# Patient Record
Sex: Female | Born: 1940 | Race: White | Hispanic: No | Marital: Married | Smoking: Never smoker
Health system: Southern US, Community
[De-identification: ages and names within clinical notes are randomized; demographics above are authoritative.]

## PROBLEM LIST (undated history)

## (undated) DIAGNOSIS — E785 Hyperlipidemia, unspecified: Secondary | ICD-10-CM

## (undated) DIAGNOSIS — I209 Angina pectoris, unspecified: Secondary | ICD-10-CM

## (undated) DIAGNOSIS — K219 Gastro-esophageal reflux disease without esophagitis: Secondary | ICD-10-CM

## (undated) DIAGNOSIS — M1712 Unilateral primary osteoarthritis, left knee: Secondary | ICD-10-CM

## (undated) DIAGNOSIS — R51 Headache: Secondary | ICD-10-CM

## (undated) DIAGNOSIS — E663 Overweight: Secondary | ICD-10-CM

## (undated) DIAGNOSIS — R0600 Dyspnea, unspecified: Secondary | ICD-10-CM

## (undated) DIAGNOSIS — F32A Depression, unspecified: Secondary | ICD-10-CM

## (undated) DIAGNOSIS — R519 Headache, unspecified: Secondary | ICD-10-CM

## (undated) DIAGNOSIS — E079 Disorder of thyroid, unspecified: Secondary | ICD-10-CM

## (undated) DIAGNOSIS — M549 Dorsalgia, unspecified: Secondary | ICD-10-CM

## (undated) DIAGNOSIS — I679 Cerebrovascular disease, unspecified: Secondary | ICD-10-CM

## (undated) DIAGNOSIS — F419 Anxiety disorder, unspecified: Secondary | ICD-10-CM

## (undated) DIAGNOSIS — J449 Chronic obstructive pulmonary disease, unspecified: Secondary | ICD-10-CM

## (undated) DIAGNOSIS — I639 Cerebral infarction, unspecified: Secondary | ICD-10-CM

## (undated) DIAGNOSIS — K279 Peptic ulcer, site unspecified, unspecified as acute or chronic, without hemorrhage or perforation: Secondary | ICD-10-CM

## (undated) DIAGNOSIS — I2510 Atherosclerotic heart disease of native coronary artery without angina pectoris: Secondary | ICD-10-CM

## (undated) DIAGNOSIS — F329 Major depressive disorder, single episode, unspecified: Secondary | ICD-10-CM

## (undated) DIAGNOSIS — E039 Hypothyroidism, unspecified: Secondary | ICD-10-CM

## (undated) DIAGNOSIS — I10 Essential (primary) hypertension: Secondary | ICD-10-CM

## (undated) DIAGNOSIS — N189 Chronic kidney disease, unspecified: Secondary | ICD-10-CM

## (undated) DIAGNOSIS — G4730 Sleep apnea, unspecified: Secondary | ICD-10-CM

## (undated) HISTORY — DX: Major depressive disorder, single episode, unspecified: F32.9

## (undated) HISTORY — PX: CARPAL TUNNEL RELEASE: SHX101

## (undated) HISTORY — DX: Depression, unspecified: F32.A

## (undated) HISTORY — DX: Angina pectoris, unspecified: I20.9

## (undated) HISTORY — DX: Atherosclerotic heart disease of native coronary artery without angina pectoris: I25.10

## (undated) HISTORY — DX: Hyperlipidemia, unspecified: E78.5

## (undated) HISTORY — DX: Overweight: E66.3

## (undated) HISTORY — DX: Essential (primary) hypertension: I10

## (undated) HISTORY — DX: Chronic kidney disease, unspecified: N18.9

## (undated) HISTORY — DX: Cerebrovascular disease, unspecified: I67.9

## (undated) HISTORY — DX: Dorsalgia, unspecified: M54.9

## (undated) HISTORY — DX: Peptic ulcer, site unspecified, unspecified as acute or chronic, without hemorrhage or perforation: K27.9

## (undated) HISTORY — DX: Disorder of thyroid, unspecified: E07.9

## (undated) HISTORY — DX: Cerebral infarction, unspecified: I63.9

## (undated) HISTORY — PX: TOTAL ABDOMINAL HYSTERECTOMY W/ BILATERAL SALPINGOOPHORECTOMY: SHX83

## (undated) HISTORY — PX: CHOLECYSTECTOMY: SHX55

## (undated) HISTORY — PX: ABDOMINAL HYSTERECTOMY: SHX81

## (undated) HISTORY — PX: JOINT REPLACEMENT: SHX530

## (undated) HISTORY — DX: Sleep apnea, unspecified: G47.30

---

## 2005-09-07 ENCOUNTER — Ambulatory Visit: Payer: Self-pay | Admitting: Cardiology

## 2005-09-11 ENCOUNTER — Ambulatory Visit: Payer: Self-pay | Admitting: *Deleted

## 2005-09-11 ENCOUNTER — Inpatient Hospital Stay (HOSPITAL_BASED_OUTPATIENT_CLINIC_OR_DEPARTMENT_OTHER): Admission: RE | Admit: 2005-09-11 | Discharge: 2005-09-11 | Payer: Self-pay | Admitting: Cardiology

## 2005-09-16 ENCOUNTER — Ambulatory Visit: Payer: Self-pay | Admitting: Cardiology

## 2005-09-16 ENCOUNTER — Inpatient Hospital Stay (HOSPITAL_COMMUNITY): Admission: RE | Admit: 2005-09-16 | Discharge: 2005-09-19 | Payer: Self-pay | Admitting: Cardiology

## 2005-09-16 ENCOUNTER — Encounter: Payer: Self-pay | Admitting: Cardiovascular Disease

## 2005-09-17 ENCOUNTER — Encounter (INDEPENDENT_AMBULATORY_CARE_PROVIDER_SITE_OTHER): Payer: Self-pay | Admitting: Specialist

## 2005-09-19 ENCOUNTER — Ambulatory Visit: Payer: Self-pay | Admitting: Internal Medicine

## 2005-10-06 ENCOUNTER — Ambulatory Visit: Payer: Self-pay | Admitting: Cardiology

## 2005-10-13 ENCOUNTER — Ambulatory Visit: Payer: Self-pay | Admitting: Cardiology

## 2006-01-05 ENCOUNTER — Ambulatory Visit: Payer: Self-pay | Admitting: Internal Medicine

## 2006-01-25 ENCOUNTER — Ambulatory Visit: Payer: Self-pay | Admitting: Internal Medicine

## 2006-01-25 ENCOUNTER — Encounter (INDEPENDENT_AMBULATORY_CARE_PROVIDER_SITE_OTHER): Payer: Self-pay | Admitting: Specialist

## 2006-02-08 ENCOUNTER — Ambulatory Visit (HOSPITAL_COMMUNITY): Admission: RE | Admit: 2006-02-08 | Discharge: 2006-02-08 | Payer: Self-pay | Admitting: Internal Medicine

## 2007-01-11 ENCOUNTER — Ambulatory Visit: Payer: Self-pay | Admitting: Cardiology

## 2007-05-16 ENCOUNTER — Ambulatory Visit: Payer: Self-pay | Admitting: Cardiology

## 2007-05-18 ENCOUNTER — Ambulatory Visit: Payer: Self-pay | Admitting: Cardiovascular Disease

## 2007-05-18 ENCOUNTER — Ambulatory Visit (HOSPITAL_COMMUNITY): Admission: AD | Admit: 2007-05-18 | Discharge: 2007-05-19 | Payer: Self-pay | Admitting: Cardiovascular Disease

## 2007-06-03 ENCOUNTER — Ambulatory Visit: Payer: Self-pay | Admitting: Cardiology

## 2007-06-03 ENCOUNTER — Encounter: Payer: Self-pay | Admitting: Cardiology

## 2007-12-15 ENCOUNTER — Encounter: Payer: Self-pay | Admitting: Cardiology

## 2008-06-08 ENCOUNTER — Ambulatory Visit: Payer: Self-pay | Admitting: Cardiology

## 2008-06-19 ENCOUNTER — Ambulatory Visit: Payer: Self-pay | Admitting: Cardiology

## 2008-06-19 ENCOUNTER — Encounter: Payer: Self-pay | Admitting: Cardiology

## 2008-06-22 ENCOUNTER — Ambulatory Visit: Payer: Self-pay | Admitting: Cardiology

## 2008-08-08 ENCOUNTER — Ambulatory Visit: Payer: Self-pay | Admitting: Cardiology

## 2008-11-21 ENCOUNTER — Encounter: Payer: Self-pay | Admitting: Cardiology

## 2009-01-05 DIAGNOSIS — I208 Other forms of angina pectoris: Secondary | ICD-10-CM

## 2009-01-05 DIAGNOSIS — I2510 Atherosclerotic heart disease of native coronary artery without angina pectoris: Secondary | ICD-10-CM

## 2009-01-05 DIAGNOSIS — I10 Essential (primary) hypertension: Secondary | ICD-10-CM | POA: Insufficient documentation

## 2009-01-05 DIAGNOSIS — E663 Overweight: Secondary | ICD-10-CM

## 2009-01-05 DIAGNOSIS — E785 Hyperlipidemia, unspecified: Secondary | ICD-10-CM | POA: Insufficient documentation

## 2009-02-12 ENCOUNTER — Telehealth (INDEPENDENT_AMBULATORY_CARE_PROVIDER_SITE_OTHER): Payer: Self-pay | Admitting: *Deleted

## 2009-02-12 ENCOUNTER — Encounter: Payer: Self-pay | Admitting: Cardiology

## 2009-04-08 ENCOUNTER — Encounter: Payer: Self-pay | Admitting: Cardiology

## 2009-04-15 ENCOUNTER — Ambulatory Visit: Payer: Self-pay | Admitting: Cardiology

## 2009-04-15 DIAGNOSIS — M25519 Pain in unspecified shoulder: Secondary | ICD-10-CM | POA: Insufficient documentation

## 2009-04-25 ENCOUNTER — Encounter: Payer: Self-pay | Admitting: Cardiology

## 2009-05-02 ENCOUNTER — Encounter: Payer: Self-pay | Admitting: Cardiology

## 2009-05-15 ENCOUNTER — Encounter: Payer: Self-pay | Admitting: Cardiology

## 2009-05-20 ENCOUNTER — Ambulatory Visit: Payer: Self-pay | Admitting: Cardiology

## 2009-06-03 ENCOUNTER — Encounter: Payer: Self-pay | Admitting: Cardiology

## 2009-06-10 ENCOUNTER — Telehealth: Payer: Self-pay | Admitting: Cardiology

## 2009-06-13 ENCOUNTER — Encounter: Payer: Self-pay | Admitting: Cardiology

## 2009-07-08 ENCOUNTER — Ambulatory Visit (HOSPITAL_COMMUNITY): Admission: RE | Admit: 2009-07-08 | Discharge: 2009-07-09 | Payer: Self-pay | Admitting: Orthopedic Surgery

## 2009-07-08 HISTORY — PX: ROTATOR CUFF REPAIR: SHX139

## 2009-08-08 ENCOUNTER — Encounter: Payer: Self-pay | Admitting: Cardiology

## 2010-02-13 ENCOUNTER — Ambulatory Visit: Payer: Self-pay | Admitting: Cardiology

## 2010-02-26 ENCOUNTER — Ambulatory Visit: Payer: Self-pay | Admitting: Cardiology

## 2010-04-29 ENCOUNTER — Encounter: Payer: Self-pay | Admitting: Cardiology

## 2010-04-29 NOTE — Assessment & Plan Note (Signed)
Summary: 6 MO F/U PER NOV REMINDER-JM   Visit Type:  Follow-up Primary Provider:  Dr. Selinda Flavin  CC:  follow-up visit.  History of Present Illness: the patient is 70 year old female with a history of coronary artery disease. status post prior stent placement in 2007.the patient has a history of angina but this has been well controlled with Imdur. Unfortunately, recently she had a fall after she quickly stepped out of the car, stood up and felt very dizzy. she experienced presyncope and eventuallyfell to the ground on her right shoulder. The patient however denies any loss of consciousness. She also points to a small area in the right hemi-chest of continuous sharp nagging pain. She states it has worsened after her recent fall.she denies other recent exertional chest pain or shortness of breath. She has no orthopnea or PND.  Clinical Review Panels:  Echocardiogram Echocardiogram Conclusions:         1. The estimated ejection fraction is 60-65%.          2. Global left ventricular wall motion and contractility are within         normal limits.         3. Valves are otherwise unremarkable without any hemodynamically         significant lesions.          4. The pericardium appears normal. (05/17/2007)  Nuclear Stress Testing Nuclear Stress Test Findings  Comments:         1. Lexiscan bolus per protocol.  No diagnostic ST changes by standard         criteria. The patient experienced no chest pain.         2. Normal LV perfusion in the setting of breast attenuation artifact.         The patient's calculated post stress LVEF was 83%. TID Ratio was         1.11. (06/19/2008)  Cardiac Imaging Cardiac Cath Findings  LEFT VENTRICULOGRAPHY:  Was deferred secondary to renal insufficiency.      ASSESSMENT:   1. Widely patent right coronary artery stent.   2. Nonobstructive coronary artery disease.      PLAN:  Recommend continued medical therapy for the patient's coronary   artery disease.   There are no areas of high-grade stenosis that warrant   PCI.      Veverly Fells. Excell Seltzer, MD  (05/18/2007)    Preventive Screening-Counseling & Management  Alcohol-Tobacco     Smoking Status: never  Current Medications (verified): 1)  Isosorbide Mononitrate Cr 30 Mg Xr24h-Tab (Isosorbide Mononitrate) .... Take One Tablet By Mouth Daily 2)  Nitrolingual 0.4 Mg/spray Soln (Nitroglycerin) .... Place 1 Spray On Tongue As Directed 3)  Protonix 40 Mg Tbec (Pantoprazole Sodium) .... Take 1 Tablet By Mouth Once A Day 4)  Citalopram Hydrobromide 20 Mg Tabs (Citalopram Hydrobromide) .... Take 1 Tablet By Mouth Once A Day 5)  Synthroid 88 Mcg Tabs (Levothyroxine Sodium) .... Take 1 Tablet By Mouth Once A Day 6)  Acyclovir 400 Mg Tabs (Acyclovir) .... Take 1 Tablet By Mouth Once A Day 7)  Zetia 10 Mg Tabs (Ezetimibe) .... Take 1 Tablet By Mouth Once A Day 8)  Plavix 75 Mg Tabs (Clopidogrel Bisulfate) .... Take One Tablet By Mouth Daily 9)  Losartan Potassium 25 Mg Tabs (Losartan Potassium) .... Take 1 Tablet By Mouth Two Times A Day 10)  Zyrtec Allergy 10 Mg Tabs (Cetirizine Hcl) .... Take 1 Tablet By Mouth Once A Day 11)  Aspirin 81 Mg Tbec (Aspirin) .... Take One Tablet By Mouth Daily 12)  Calcium Carbonate 600 Mg Tabs (Calcium Carbonate) .... Take 1 Tablet By Mouth Once A Day 13)  Vitamin D 400 Unit Tabs (Cholecalciferol) .... Take 1 Tablet By Mouth Once A Day 14)  Fish Oil 1200 Mg Caps (Omega-3 Fatty Acids) .... Take 1 Capsule By Mouth Once A Day 15)  Folic Acid 800 Mcg Tabs (Folic Acid) .... Take 1 Tablet By Mouth Once A Day 16)  Multivitamins  Tabs (Multiple Vitamin) .... Take 1 Tablet By Mouth Once A Day  Allergies: 1)  ! Avelox (Moxifloxacin Hcl) 2)  ! * Statins 3)  ! Lopid 4)  ! Ace Inhibitors  Comments:  Nurse/Medical Assistant: The patient's medications were reviewed with the patient and were updated in the Medication List. Pt brought a list of medications to her office  visit. 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  Nonobstructive cerebrovascular disease History of stroke Depression Chronic renal insufficiency GFR 45 ML's per minute Nonobstructive cerebrovascular disease by carotid Dopplers October 2008  Review of Systems  The patient denies fatigue, malaise, fever, weight gain/loss, vision loss, decreased hearing, hoarseness, chest pain, palpitations, shortness of breath, prolonged cough, wheezing, sleep apnea, coughing up blood, abdominal pain, blood in stool, nausea, vomiting, diarrhea, heartburn, incontinence, blood in urine, muscle weakness, joint pain, leg swelling, rash, skin lesions, headache, fainting, dizziness, depression, anxiety, enlarged lymph nodes, easy bruising or bleeding, and environmental allergies.         right arm pain. Atypical chest pain status post fall  Vital Signs:  Patient profile:   70 year old female Height:      62 inches Weight:      211 pounds BMI:     38.73 Pulse rate:   55 / minute BP sitting:   173 / 84  (left arm) Cuff size:   large  Vitals Entered By: Cyril Loosen, RN, BSN (April 15, 2009 1:16 PM)  Nutrition Counseling: Patient's BMI is greater than 25 and therefore counseled on weight management options. CC: follow-up visit   Physical Exam  Additional Exam:  General: Well-developed, well-nourished in no distress head: Normocephalic and atraumatic eyes  PERRLA/EOMI intact, conjunctiva and lids normal nose: No deformity or lesions mouth normal dentition, normal posterior pharynx neck: Supple, no JVD.  No masses, thyromegaly or abnormal cervical nodes lungs: Normal breath sounds bilaterally without wheezing.  Normal percussion heart: regular rate and rhythm with normal S1 and S2, no S3 or S4.  PMI is normal.  No pathological murmurs abdomen: Normal bowel sounds, abdomen is soft and nontender without masses, organomegaly or hernias noted.  No hepatosplenomegaly musculoskeletal: Back normal, normal gait muscle strength and tone normal. Decreased range of motion in right shoulder joint pulsus: Pulse is normal in all 4 extremities Extremities: No peripheral pitting edema neurologic: Alert and oriented x 3 skin: Intact without lesions or rashes cervical nodes: No significant adenopathy psychologic: Normal affect    Impression & Recommendations:  Problem # 1:  CAD, NATIVE VESSEL (ICD-414.01) the patient has known coronary artery disease. She  reports atypical chest pain not consistent with angina. 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 The patient had Lexiscan done early last year. Her updated medication list for this problem includes:    Isosorbide Mononitrate Cr 30 Mg Xr24h-tab (Isosorbide mononitrate) .Marland Kitchen... Take one tablet by mouth daily    Nitrolingual 0.4 Mg/spray Soln (Nitroglycerin) .Marland Kitchen... Place 1 spray on tongue as directed    Plavix 75 Mg Tabs (Clopidogrel bisulfate) .Marland Kitchen... Take one tablet by mouth daily    Aspirin 81 Mg Tbec (Aspirin) .Marland Kitchen... Take one tablet by mouth daily  Orders: T-Basic Metabolic Panel ([id number] Orders: T- * Misc. Laboratory test [id number]) ... 04/24/2009  Problem # 2:  HYPERTENSION, UNSPECIFIED (ICD-401.9) blood pressure is poorly controlled. Systolic blood pressure was 173 mmHg. I discontinued metoprolol and change the patient to losartan 25 mg p.o. b.i.d. I stopped the  beta blocker because she also complains of fatigue and weakness and she is bradycardic. We will check a BMET and plasma renin activity in one week to help [place] further diet treatment with renin blockers or  diuretics. Her updated medication list for this problem includes:    Losartan Potassium 25 Mg Tabs (Losartan potassium) .Marland Kitchen... Take 1 tablet by mouth two times a day    Aspirin 81 Mg Tbec (Aspirin) .Marland Kitchen... Take one tablet by mouth daily  Orders: T-Basic Metabolic Panel ([id number] Orders: T- * Misc. Laboratory test [id number]) ... 04/24/2009  Problem # 3:  HYPERLIPIDEMIA-MIXED (ICD-272.4) lipid panel is followed by the patient's primary care physician. Her updated medication list for this problem includes:    Zetia 10 Mg Tabs (Ezetimibe) .Marland Kitchen... Take 1 tablet by mouth once a day  Orders: T-Basic Metabolic Panel ([id number] Orders: T- * Misc. Laboratory test [id number]) ... 04/24/2009  Problem # 4:  SHOULDER PAIN (ICD-719.41) I have given the patient orthopedic referral but she continues to have significant pain in the right shoulder after her recent fall. Orders: Orthopedic Referral (Ortho)  Patient Instructions: 1)  Referral to Dr. Herbert Moors for right shoulder and knee - appt. scheduled for Friday, January 21 at 9:00.  [phone]) 2)  Stop Metoprolol 3)  Change to Losartan 25mg  two times a day  4)  Labs:  BMET and Plasma Renin Activity in next 7-10 days.   5)  Follow up in  6 months. 6)  Insurance kicked out the Losartan 25mg  two times a day, so will change rx to 100mg  tablet.  Will take 1/2 tab (50mg ) daily x 7 days, then 1 tab daily.  Pt. notified and [facility] also.   Prescriptions: LOSARTAN POTASSIUM 25 MG TABS (LOSARTAN POTASSIUM) Take 1 tablet by mouth two times a day  #60 x 6   Entered by:   Hoover Brunette, LPN   Authorized by:   Lewayne Bunting, MD, [facility]   Signed by:   Hoover Brunette, LPN on 74/25/9563   Method used:   Electronically to        [facility]  [id number] (retail)        [address]       [place], [place]  [address]       Ph: [phone] or [phone]       Fax: [phone]   RxID:   [id number]   Appended Document: 6 MO F/U PER NOV REMINDER-JM Patient should at low risk for cardiovascular complications related to shoulder surgery. patient has SCP, but this is atypical and  had a negative ischemia work- up less then a year ago.  {USER.REALNAME}  {DATETIMESTAMP()}  Appended Document: 6 MO F/U PER NOV REMINDER-JM info faxed to [employer] [phone]

## 2010-04-29 NOTE — Letter (Signed)
Summary: Letter/ [facility]  Letter/ [facility]   Imported By: Dorise Hiss 06/03/2009 13:57:18  _____________________________________________________________________  External Attachment:    Type:   Image     Comment:   External Document  Appended Document: Letter/ [facility] info faxed to [phone]

## 2010-04-29 NOTE — Assessment & Plan Note (Signed)
Summary: orthostatic bp --agh  Nurse Visit   Vital Signs:  Patient profile:   70 year old female Height:      62 inches Weight:      208 pounds Pulse (ortho):   81 / minute BP standing:   127 / 67  Vitals Entered By: Carlye Grippe (February 26, 2010 8:42 AM)  Serial Vital Signs/Assessments:  Time      Position  BP       Pulse  Resp  Temp     By 8:55 AM   Lying LA  126/72   68                    Lydia Anderson 8:55 AM   Sitting   97/63    73                    Lydia Anderson 8:55 AM   Standing  127/67   81                    Lydia Anderson 8:58 AM   Standing  131/80   82                    Carlye Grippe 9:01 AM   Standing  116/77   87                    Carlye Grippe   Visit Type:  nurse visit  CC:  orthostatic blood pressures.  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  Nonobstructive cerebrovascular disease History of stroke Depression Chronic renal insufficiency GFR 45 ML's per minute Nonobstructive cerebrovascular disease by carotid Dopplers October 2008   Current Medications (verified): 1)  Imdur 60 Mg Xr24h-Tab (Isosorbide Mononitrate) .... Take 1 1/2 Tab (90mg ) Daily 2)  Nitrolingual 0.4 Mg/spray Soln (Nitroglycerin) .... Place 1 Spray On Tongue As Directed 3)  Citalopram Hydrobromide 20 Mg Tabs (Citalopram Hydrobromide)  .... Take 1 Tablet By Mouth Once A Day 4)  Synthroid 88 Mcg Tabs (Levothyroxine Sodium) .... Take 1 Tablet By Mouth Once A Day 5)  Acyclovir 400 Mg Tabs (Acyclovir) .... Take 1 Tablet By Mouth Once A Day 6)  Zetia 10 Mg Tabs (Ezetimibe) .... Take 1 Tablet By Mouth Once A Day 7)  Plavix 75 Mg Tabs (Clopidogrel Bisulfate) .... Take One Tablet By Mouth Daily 8)  Zyrtec Allergy 10 Mg Tabs (Cetirizine Hcl) .... Take 1 Tablet By Mouth Once A Day 9)  Aspirin 81 Mg Tbec (Aspirin) .... Take One Tablet By Mouth Daily 10)  Omeprazole 20 Mg Cpdr (Omeprazole) .... Take 1 Tablet By Mouth Once A Day  Allergies (verified): 1)  ! Avelox (Moxifloxacin Hcl) 2)  ! * Statins 3)  ! Lopid 4)  ! Ace Inhibitors  Comments:  Nurse/Medical Assistant: The patient's medication bottles and allergies were reviewed with the patient and were updated in the Medication and Allergy Lists.  Orders Added: 1)  Est. Patient Level I [16109]  blood pressure better off losartan.  Still some slight orthostasis.  Notify patient to get up slowly and consider compression stockings. Lewayne Bunting, MD, [facility]  March 05, 2010 11:57 AM   Patient notified.    Hoover Brunette, LPN  March 06, 2010 10:24 AM

## 2010-04-29 NOTE — Progress Notes (Signed)
Summary: PHONE: NEEDS PRE OP FORM FOR SURGERY COMPLETED  Phone Note Call from Patient   Caller: Patient Reason for Call: Talk to Doctor Summary of Call: needs for you to please complete a form from Dr. Collie Siad that is in EMR so that she can get surgery scheduled for a shoulder surgery. Initial call taken by: Zachary George,  June 10, 2009 3:48 PM  Follow-up for Phone Call        info faxed to [phone] [facility]. Follow-up by: Carlye Grippe,  June 13, 2009 2:01 PM

## 2010-04-29 NOTE — Assessment & Plan Note (Signed)
Summary: Y8M   Visit Type:  Follow-up Primary Provider:  Dr. Selinda Flavin   History of Present Illness: the patient is 70 year old female with a history of coronary artery disease. status post prior stent placement in 2007. the patient had a followup cardiac catheterization 2009 with no significant obstructive coronary artery disease.she also had a negative Lexiscan done in 2010.the patient has a history of angina but this has been well controlled with Imdur.The patient had blood work done in May of 2011 creatinine was 1.3 with a GFR of 41 mL's per minute. Potassium was 3.5. INR was within normal limits. CBC was also within normal limits. reportedly the patient needs to have shoulder surgery done.shoulder has been done. Needs knee surgery.  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 Dr Dimas Aguas ordered studies.  CT scan of the chest showed stable anterior mediastinal nodule compared to prior CT scans. Recommendation is to perform a limited scan of the mediastinum in the future and no active lung disease.  Feels exhausted all the time. Just taking trash out makes her very fatigued. Off on chest pain. Imdur helped last time. Orthostatic symptoms. Left neck cramp. Maybe BP running too low.  Preventive Screening-Counseling & Management  Alcohol-Tobacco     Smoking Status: never  Current Medications (verified): 1)  Imdur 60 Mg Xr24h-Tab (Isosorbide Mononitrate) .... Take 1 1/2 Tab (90mg ) Daily 2)  Nitrolingual 0.4 Mg/spray Soln (Nitroglycerin) .... Place 1 Spray On Tongue As Directed 3)  Citalopram Hydrobromide 20 Mg Tabs (Citalopram Hydrobromide) .... Take 1 Tablet By Mouth Once A Day 4)  Synthroid 88 Mcg  Tabs (Levothyroxine Sodium) .... Take 1 Tablet By Mouth Once A Day 5)  Acyclovir 400 Mg Tabs (Acyclovir) .... Take 1 Tablet By Mouth Once A Day 6)  Zetia 10 Mg Tabs (Ezetimibe) .... Take 1 Tablet By Mouth Once A Day 7)  Plavix 75 Mg Tabs (Clopidogrel Bisulfate) .... Take One Tablet By Mouth Daily 8)  Zyrtec Allergy 10 Mg Tabs (Cetirizine Hcl) .... Take 1 Tablet By Mouth Once A Day 9)  Aspirin 81 Mg Tbec (Aspirin) .... Take One Tablet By Mouth Daily 10)  Omeprazole 20 Mg Cpdr (Omeprazole) .... Take 1 Tablet By Mouth Once A Day  Allergies (verified): 1)  ! Avelox (Moxifloxacin Hcl) 2)  ! * Statins 3)  ! Lopid 4)  ! Ace Inhibitors  Comments:  Nurse/Medical Assistant: The patient's medication list and allergies were reviewed with the patient and were updated in the Medication and Allergy Lists.  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  Nonobstructive cerebrovascular disease History of stroke Depression Chronic renal insufficiency GFR 45 ML's per minute Nonobstructive cerebrovascular disease by carotid Dopplers October 2008  Past Surgical History: Last updated: 01/05/2009 carpal tunnel Cholecystectomy TAH and BSO  Social History: Last updated: 01/05/2009 Married  Tobacco Use - No.  Alcohol Use - no  Risk Factors: Smoking Status: never (02/13/2010)  Review of Systems       The patient complains of fatigue and shortness of breath.  The patient denies malaise,  fever, weight gain/loss, vision loss, decreased hearing, hoarseness, chest pain, palpitations, prolonged cough, wheezing, sleep apnea, coughing up blood, abdominal pain, blood in stool, nausea, vomiting, diarrhea, heartburn, incontinence, blood in urine, muscle weakness, joint pain, leg swelling, rash, skin lesions, headache, fainting, dizziness, depression, anxiety, enlarged lymph nodes, easy bruising or bleeding, and environmental allergies.    Vital Signs:  Patient profile:   70 year old female Height:      62 inches Weight:      206 pounds BMI:     37.81 Pulse rate:   66 / minute Pulse (ortho):   77 / minute BP sitting:   113 / 73  (left arm) BP standing:   87 / 60 Cuff size:   large  Vitals Entered By: Carlye Grippe (February 13, 2010 11:03 AM)  Nutrition Counseling: Patient's BMI is greater than 25 and therefore counseled on weight management options.  Serial Vital Signs/Assessments:  Time      Position  BP       Pulse  Resp  Temp     By 11:50 AM  Lying [place]  95/59    60                    [facility], LPN 27:25 AM  Sitting   107/71   67                    Gayle [address] LPN 36:64 AM  Standing  87/60    77                    [place], LPN  Comments: 40:34 AM after 3 min:  102/66  71 after 5 min:  101/63  76  By: Hoover Brunette, LPN    Physical Exam  Additional Exam:  General: Well-developed, well-nourished in no distress head: Normocephalic and atraumatic eyes PERRLA/EOMI intact, conjunctiva and lids normal nose: No deformity or lesions mouth normal dentition, normal posterior pharynx neck: Supple, no JVD.  No masses, thyromegaly or abnormal cervical nodes lungs: Normal breath sounds bilaterally without wheezing.  Normal percussion heart: regular rate and rhythm with normal S1 and S2, no S3 or S4.  PMI is normal.  No pathological murmurs abdomen: Normal bowel sounds, abdomen is soft and nontender without masses, organomegaly or hernias noted.  No  hepatosplenomegaly musculoskeletal: Back normal, normal gait muscle strength and tone normal. Decreased range of motion in right shoulder joint pulsus: Pulse is normal in all 4 extremities Extremities: No peripheral pitting edema neurologic: Alert and oriented x 3 skin: Intact without lesions or rashes cervical nodes: No significant adenopathy psychologic: Normal affect    Impression & Recommendations:  Problem # 1:  CAD, NATIVE VESSEL (ICD-414.01) the patient has little short of coronary artery disease. Although she has shortness of breath I do not think this is an angina equivalent. She also had a negative Lexiscan 2010. She may have microvascular disease and therefore I've increased her Imdur to 90 mg p.o. q. daily. Her updated medication list for this problem includes:    Imdur 60 Mg Xr24h-tab (Isosorbide mononitrate) .Marland Kitchen... Take 1 1/2 tab (90mg ) daily    Nitrolingual 0.4 Mg/spray Soln (Nitroglycerin) .Marland Kitchen... Place 1 spray on tongue as directed    Plavix 75 Mg Tabs (Clopidogrel bisulfate) .Marland KitchenMarland KitchenMarland KitchenMarland Kitchen  Take one tablet by mouth daily    Aspirin 81 Mg Tbec (Aspirin) .Marland Kitchen... Take one tablet by mouth daily  Orders: EKG w/ Interpretation (93000)  Problem # 2:  HYPERTENSION, UNSPECIFIED (ICD-401.9) patient complains of significant fatigue. Her blood pressure is now running much lower than before. Reaction to do orthostatics in the office and her lowest systolic blood pressure was 95 mm of mercury. We will stop her ARB to see this makes her feel better The following medications were removed from the medication list:    Losartan Potassium 50 Mg Tabs (Losartan potassium) .Marland Kitchen... Take 1 tablet by mouth once a day Her updated medication list for this problem includes:    Aspirin 81 Mg Tbec (Aspirin) .Marland Kitchen... 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 Her updated medication list for this problem includes:    Imdur 60 Mg  Xr24h-tab (Isosorbide mononitrate) .Marland Kitchen... Take 1 1/2 tab (90mg ) daily    Nitrolingual 0.4 Mg/spray Soln (Nitroglycerin) .Marland Kitchen... Place 1 spray on tongue as directed    Plavix 75 Mg Tabs (Clopidogrel bisulfate) .Marland Kitchen... Take one tablet by mouth daily    Aspirin 81 Mg Tbec (Aspirin) .Marland Kitchen... Take one tablet by mouth daily  Patient Instructions: 1)  Stop Losartan 2)  Increase Imdur to 90mg  daily 3)  Nurse visit in one week for orthostatic blood pressure  4)  Follow up in  6 months 5)  Call placed to [facility] to cancel rx of Losartan. Prescriptions: NITROLINGUAL 0.4 MG/SPRAY SOLN (NITROGLYCERIN) Place 1 spray on tongue as directed  #1 x 3   Entered by:   Hoover Brunette, LPN   Authorized by:   Lewayne Bunting, MD, [facility]   Signed by:   Hoover Brunette, LPN on 16/12/9602   Method used:   Electronically to        [facility]  [address] [address] [id number]ail[facility]       [address]       [place], [place]  [address]       Ph: [phone] or [phone]       Fax: [phone]   RxID:   [id number] IMDUR 60 MG XR24H-TAB (ISOSORBIDE MONONITRATE) take 1 1/2 tab (90mg ) daily  #45 x 6   Entered by:   Hoover Brunette, LPN   Authorized by:   Lewayne Bunting, MD, [facility]   Signed by:   Hoover Brunette, LPN on 40/34/7425   Method used:   [facility]ronically to        [facility]  [address] [id number]       [address]       [place], [place]  [address]       Ph: [phone] or [phone]       Fax: [phone]   RxID:   [id number] LOSARTAN POTASSIUM 50 MG TABS (LOSARTAN POTASSIUM) Take 1 tablet by mouth once a day  #30 x 6   Entered by:   Hoover Brunette, LPN   Authorized by:   Lewayne Bunting, MD, [facility]   Signed by:   Hoover Brunette, LPN on 62/83/1517   Method used:   [facility]        [facility]  [address] [id number]       [address]       [place]  [place], [place]  [address]       Ph: [phone] or [phone]       Fax: [phone]   RxID:   [id number]

## 2010-04-29 NOTE — Letter (Signed)
Summary: Letter/ FAXED CLEARANCE MURPHY [place]  Letter/ FAXED CLEARANCE [facility]URPHY [facility]   Imported By: Dorise Hiss 06/14/2009 14:44:12  _____________________________________________________________________  External Attachment:    Type:   Image     Comment:   External Document

## 2010-04-29 NOTE — Assessment & Plan Note (Signed)
Summary: BP CHECK  --[facility]  Nurse Visit   Vital Signs:  Patient profile:   70 year old female Height:      62 inches Weight:      211 pounds Pulse rate:   66 / minute BP sitting:   137 / 82  (left arm) Cuff size:   large  Vitals Entered By: Carlye Grippe (May 20, 2009 9:00 AM)  Visit Type:  nurse BP  check  CC:  nurse bp check.  CC: nurse bp check  AV:[id number] HTN--yes YNW:[id number] meds?--yes Side effects?--no Chest pain, SOB, Dizziness?--no A/P: 1. HTN (401.1)             At goal?              If no, physician will be notified.              Follow up in ...Marland KitchenMarland KitchenMarland Kitchen  5 minutes was spent with the patient.      Preventive Screening-Counseling & Management  Alcohol-Tobacco     Smoking Status: never  Current Medications (verified): 1)  Isosorbide Mononitrate Cr 60 Mg Xr24h-Tab (Isosorbide Mononitrate) .... Take 1 Tablet By Mouth Once A Day 2)  Nitrolingual 0.4 Mg/spray Soln (Nitroglycerin) .... Place 1 Spray On Tongue As Directed 3)  Citalopram Hydrobromide 20 Mg Tabs (Citalopram Hydrobromide) .... Take 1 Tablet By Mouth Once A Day 4)  Synthroid 88 Mcg Tabs (Levothyroxine Sodium) .... Take 1 Tablet By Mouth Once A Day 5)  Acyclovir 400 Mg Tabs (Acyclovir) .... Take 1 Tablet By Mouth Once A Day 6)  Zetia 10 Mg Tabs (Ezetimibe) .... Take 1 Tablet By Mouth Once A Day 7)  Plavix 75 Mg Tabs (Clopidogrel Bisulfate) .... Take One Tablet By Mouth Daily 8)  Losartan Potassium 100 Mg Tabs (Losartan Potassium) .... Take 1 Tablet By Mouth Once A Day 9)  Zyrtec Allergy 10 Mg Tabs (Cetirizine Hcl) .... Take 1 Tablet By Mouth Once A Day 10)  Aspirin 81 Mg Tbec (Aspirin) .... Take One Tablet By Mouth Daily 11)  Calcium Carbonate 600 Mg Tabs (Calcium Carbonate) .... Take 1 Tablet By Mouth Once A Day 12)  Vitamin D 400 Unit Tabs (Cholecalciferol) .... Take 1 Tablet By Mouth Once A Day 13)  Fish Oil 1200 Mg Caps (Omega-3 Fatty Acids) .... Take 1 Capsule By Mouth Once A Day 14)  Folic  Acid 800 Mcg Tabs (Folic Acid) .... Take 1 Tablet By Mouth Once A Day 15)  Multivitamins  Tabs (Multiple Vitamin) .... Take 1 Tablet By Mouth Once A Day 16)  Omeprazole 20 Mg Cpdr (Omeprazole) .... Take 1 Tablet By Mouth Once A Day  Allergies (verified): 1)  ! Avelox (Moxifloxacin Hcl) 2)  ! * Statins 3)  ! Lopid 4)  ! Ace Inhibitors  Comments:  Nurse/Medical Assistant: The patient's medications and allergies were reviewed with the patient and were updated in the Medication and Allergy Lists. Bottles reviewed.  Orders Added: 1)  Est. Patient Level I [99211]  BP under much better control. Continue current regimen. Lewayne Bunting, MD, [facility]  May 20, 2009 8:14 PM   Patient notified.   Hoover Brunette, LPN  May 21, 2009 3:19 PM

## 2010-05-07 NOTE — Letter (Signed)
Summary: External Correspondence/ PROGRESS NOTE MURPHY/WAINER  External Correspondence/ PROGRESS NOTE MURPHY/[facility]ER   Imported By: Dorise Hiss 04/30/2010 13:52:48  _____________________________________________________________________  External Attachment:    Type:   Image     Comment:   External Document

## 2010-05-14 ENCOUNTER — Encounter: Payer: Self-pay | Admitting: Cardiology

## 2010-06-05 ENCOUNTER — Encounter: Payer: Self-pay | Admitting: *Deleted

## 2010-06-05 NOTE — Letter (Signed)
Summary: External Correspondence/  DR. Dimas Aguas  External Correspondence/  DR. Dimas Aguas   Imported By: Dorise Hiss 05/27/2010 16:58:37  _____________________________________________________________________  External Attachment:    Type:   Image     Comment:   External Document

## 2010-06-18 LAB — URINALYSIS, ROUTINE W REFLEX MICROSCOPIC
Glucose, UA: NEGATIVE mg/dL
Hgb urine dipstick: NEGATIVE
Nitrite: NEGATIVE
Protein, ur: NEGATIVE mg/dL
Urobilinogen, UA: 0.2 mg/dL (ref 0.0–1.0)
pH: 6 (ref 5.0–8.0)

## 2010-06-18 LAB — BASIC METABOLIC PANEL
BUN: 8 mg/dL (ref 6–23)
CO2: 28 mEq/L (ref 19–32)
Creatinine, Ser: 1.32 mg/dL — ABNORMAL HIGH (ref 0.4–1.2)
Potassium: 4.4 mEq/L (ref 3.5–5.1)

## 2010-06-18 LAB — DIFFERENTIAL
Basophils Absolute: 0 10*3/uL (ref 0.0–0.1)
Basophils Relative: 1 % (ref 0–1)
Eosinophils Absolute: 0.1 10*3/uL (ref 0.0–0.7)
Lymphs Abs: 2.4 10*3/uL (ref 0.7–4.0)
Monocytes Absolute: 0.8 10*3/uL (ref 0.1–1.0)
Monocytes Relative: 12 % (ref 3–12)

## 2010-06-18 LAB — COMPREHENSIVE METABOLIC PANEL
ALT: 25 U/L (ref 0–35)
AST: 24 U/L (ref 0–37)
BUN: 14 mg/dL (ref 6–23)
CO2: 26 mEq/L (ref 19–32)
Calcium: 9.1 mg/dL (ref 8.4–10.5)
GFR calc non Af Amer: 41 mL/min — ABNORMAL LOW (ref >60)

## 2010-06-18 LAB — CBC
HCT: 34.7 % — ABNORMAL LOW (ref 36.0–46.0)
Hemoglobin: 13.1 g/dL (ref 12.0–15.0)
MCV: 95.1 fL (ref 78.0–100.0)
Platelets: 225 10*3/uL (ref 150–400)
RBC: 3.61 MIL/uL — ABNORMAL LOW (ref 3.87–5.11)
RDW: 12.9 % (ref 11.5–15.5)
WBC: 6.7 10*3/uL (ref 4.0–10.5)
WBC: 7.9 10*3/uL (ref 4.0–10.5)

## 2010-06-24 ENCOUNTER — Encounter: Payer: Self-pay | Admitting: *Deleted

## 2010-06-24 ENCOUNTER — Ambulatory Visit (INDEPENDENT_AMBULATORY_CARE_PROVIDER_SITE_OTHER): Payer: BC Managed Care – PPO | Admitting: Cardiology

## 2010-06-24 VITALS — BP 119/75 | HR 83 | Temp 98.9°F | Ht 62.0 in | Wt 203.0 lb

## 2010-06-24 DIAGNOSIS — R072 Precordial pain: Secondary | ICD-10-CM

## 2010-06-24 DIAGNOSIS — I10 Essential (primary) hypertension: Secondary | ICD-10-CM

## 2010-06-24 DIAGNOSIS — Z01818 Encounter for other preprocedural examination: Secondary | ICD-10-CM

## 2010-06-24 DIAGNOSIS — I2510 Atherosclerotic heart disease of native coronary artery without angina pectoris: Secondary | ICD-10-CM

## 2010-06-24 NOTE — Assessment & Plan Note (Signed)
Will review Lexiscan before making final recommendations.

## 2010-06-24 NOTE — Progress Notes (Signed)
HPI Patient is a 70 year old female with a history coronary artery disease.  She status post prior stent placement in 2007.  Follow-up catheterization in 2009 showed no significant obstructive coronary artery disease.  She had a negative Lexiscan done in 2010.  She has a history of angina but this has been well controlled with isosorbide mononitrate.  Her last visit her Imdur was increased to 90 Miller as p.o. daily  Her renal function has remained stable.  CBC last year was also within normal limits..  She also had an MRI done which showed remote left inferior cerebellar infarct within the left tP ICA distribution with diminished flow-related enhancement in the left vertebral but she has known obstructive carotid artery disease. Her blood pressure was running low also during the last office visit with positive orthostatics we stopped her ARB at that time.  The patient was then seen for a nurse visit and was doing well at that time. She now presents for follow I reviewed the patient's medication list is currently not taking an ACE inhibitor anymore.  Her blood pressures under good control. She is now presenting for cardiac clearance for right knee surgery. The patient states that she continues to have episodes of substernal chest pain particularly when turning out to garbage.  He then become short of breath.  And has to sit down.  Sometimes even cooking or making dinner causes her just to have substernal chest pain.  Increasing isosorbide mononitrate did not appear to have helped.  Her pattern however appears to be stable and is really unchanged from her prior presentation in his office. She is complaining currently however off a cough and has bronchitis.  The shortness of breath is improving.  Allergies  Allergen Reactions  . Ace Inhibitors     REACTION: Cough  . Gemfibrozil     REACTION: Muscle Aches  . Moxifloxacin     REACTION: Rash  . Statins     REACTION: Muscle Cramps    Current  Outpatient Prescriptions on File Prior to Visit  Medication Sig Dispense Refill  . acyclovir (ZOVIRAX) 400 MG tablet Take one by mouth daily        . aspirin 81 MG tablet Take 81 mg by mouth daily.        . citalopram (CELEXA) 20 MG tablet Take 20 mg by mouth daily.        Marland Kitchen ezetimibe (ZETIA) 10 MG tablet Take 10 mg by mouth daily.        . isosorbide mononitrate (IMDUR) 60 MG 24 hr tablet Take 1&1/2 by mouth daily        . levothyroxine (SYNTHROID, LEVOTHROID) 88 MCG tablet Take 88 mcg by mouth daily.        . nitroGLYCERIN (NITROLINGUAL) 0.4 MG/SPRAY spray Place 1 spray under the tongue every 5 (five) minutes as needed.        Marland Kitchen omeprazole (PRILOSEC) 20 MG capsule Take 20 mg by mouth daily.          Past Medical History  Diagnosis Date  . Overweight     OBESITY  . Hyperlipidemia   . Coronary artery disease     S/P CYPHER DRUG-ELUTING STENTS TO RIGHT CORONARY ARTERY JUNE 2007  . Coronary atherosclerosis of native coronary artery     NEGATIVE LEXISCAN FOR ISCHEMIA MARCH 23,2010 NORMAL LV FUNCTTION  . Other and unspecified angina pectoris     ANGINA  . Hypertension   . Back pain   . Peptic ulcer  disease   . Thyroid disease     TREATED HYPOTHYROIDISM  . Sleep apnea     SEVERE OBSTRUCTIVE  . 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 Stroke   . Depression   . Chronic renal insufficiency     GFR 45 ML'S PER MINUTE    Past Surgical History  Procedure Date  . Carpal tunnel release   . Cholecystectomy   . Total abdominal hysterectomy w/ bilateral salpingoophorectomy     No family history on file.  History   Social History  . Marital Status: Married    Spouse Name: N/A    Number of Children: N/A  . Years of Education: N/A   Occupational History  . Not on file.   Social History Main Topics  . Smoking status: Never Smoker   . Smokeless tobacco: Not on file  . Alcohol Use: No  . Drug Use: No  . Sexually Active:    Other  Topics Concern  . Not on file   Social History Narrative  . No narrative on file   Review of systems:The patient reports a cough. She reports shortness of breath as outlined above. Pertinent positives as outlined above. The remainder of the 18 point review of systems is otherwise within normal limits  PHYSICAL EXAM BP 119/75  Pulse 83  Temp 98.9 F (37.2 C)  Ht 5\' 2"  (1.575 m)  Wt 203 lb (92.08 kg)  BMI 37.13 kg/m2  SpO2 94% General: Well-developed, well-nourished in no distress Head: Normocephalic and atraumatic Eyes:PERRLA/EOMI intact, conjunctiva and lids normal Ears: No deformity or lesions Mouth:normal dentition, normal posterior pharynx Neck: Supple, no JVD.  No masses, thyromegaly or abnormal cervical nodes Lungs: Normal breath sounds bilaterally without wheezing.  Normal percussion Cardiac: regular rate and rhythm with normal S1 and S2, no S3 or S4.  PMI is normal.  No pathological murmurs Abdomen: Normal bowel sounds, abdomen is soft and nontender without masses, organomegaly or hernias noted.  No hepatosplenomegaly MSK: Back normal, normal gait muscle strength and tone normal Vascular: Pulse is normal in all 4 extremities Extremities: No peripheral pitting edema Neurologic: Alert and oriented x 3 Skin: Intact without lesions or rashes Lymphatics: No significant adenopathyPsychologic: Normal affect   [id number] was reviewed.  The patient is in normal sinus rhythm and otherwise she has a normal electrocardiogram.  There are no acute changes.  ASSESSMENT AND PLAN

## 2010-06-24 NOTE — Assessment & Plan Note (Signed)
Prior history of orthostatic hypotension but now well controlled.

## 2010-06-24 NOTE — Assessment & Plan Note (Signed)
Patient continues to have exertional chest pressure despite a prior negative cardiac catheterization 2009 and a negative Lexiscan 2010.  However given the fact that she is planning to undergo knee surgery we will repeat a Lexiscan and make sure that the patient has no significant ischemia.  I have made no changes in her medications however the present time.

## 2010-07-01 DIAGNOSIS — R072 Precordial pain: Secondary | ICD-10-CM

## 2010-07-09 ENCOUNTER — Encounter: Payer: Self-pay | Admitting: Cardiology

## 2010-07-10 ENCOUNTER — Encounter: Payer: Self-pay | Admitting: Cardiology

## 2010-07-16 ENCOUNTER — Telehealth: Payer: Self-pay | Admitting: *Deleted

## 2010-07-22 ENCOUNTER — Ambulatory Visit (HOSPITAL_COMMUNITY)
Admission: RE | Admit: 2010-07-22 | Discharge: 2010-07-22 | Disposition: A | Discharge status: Home / Self Care | Payer: Medicare Other | Source: Ambulatory Visit | Attending: Orthopedic Surgery | Admitting: Orthopedic Surgery

## 2010-07-22 ENCOUNTER — Encounter (HOSPITAL_COMMUNITY)
Admission: RE | Admit: 2010-07-22 | Discharge: 2010-07-22 | Disposition: A | Discharge status: Home / Self Care | Payer: Medicare Other | Source: Ambulatory Visit | Attending: Orthopedic Surgery | Admitting: Orthopedic Surgery

## 2010-07-22 ENCOUNTER — Other Ambulatory Visit (HOSPITAL_COMMUNITY): Payer: Self-pay | Admitting: Orthopedic Surgery

## 2010-07-22 DIAGNOSIS — IMO0002 Reserved for concepts with insufficient information to code with codable children: Secondary | ICD-10-CM | POA: Insufficient documentation

## 2010-07-22 DIAGNOSIS — I10 Essential (primary) hypertension: Secondary | ICD-10-CM | POA: Insufficient documentation

## 2010-07-22 DIAGNOSIS — M1711 Unilateral primary osteoarthritis, right knee: Secondary | ICD-10-CM

## 2010-07-22 DIAGNOSIS — M1710 Unilateral primary osteoarthritis, unspecified knee: Secondary | ICD-10-CM | POA: Insufficient documentation

## 2010-07-22 DIAGNOSIS — Z01812 Encounter for preprocedural laboratory examination: Secondary | ICD-10-CM | POA: Insufficient documentation

## 2010-07-22 DIAGNOSIS — R0602 Shortness of breath: Secondary | ICD-10-CM | POA: Insufficient documentation

## 2010-07-22 DIAGNOSIS — Z01818 Encounter for other preprocedural examination: Secondary | ICD-10-CM | POA: Insufficient documentation

## 2010-07-22 DIAGNOSIS — Z01810 Encounter for preprocedural cardiovascular examination: Secondary | ICD-10-CM | POA: Insufficient documentation

## 2010-07-22 LAB — COMPREHENSIVE METABOLIC PANEL
ALT: 21 U/L (ref 0–35)
AST: 26 U/L (ref 0–37)
Alkaline Phosphatase: 74 U/L (ref 39–117)
CO2: 27 mEq/L (ref 19–32)
Calcium: 9.1 mg/dL (ref 8.4–10.5)
GFR calc Af Amer: 37 mL/min — ABNORMAL LOW (ref >60)
GFR calc non Af Amer: 31 mL/min — ABNORMAL LOW (ref >60)
Glucose, Bld: 82 mg/dL (ref 70–99)
Potassium: 4.3 mEq/L (ref 3.5–5.1)
Sodium: 141 mEq/L (ref 135–145)
Total Protein: 6.9 g/dL (ref 6.0–8.3)

## 2010-07-22 LAB — CBC
HCT: 38.7 % (ref 36.0–46.0)
Hemoglobin: 13.4 g/dL (ref 12.0–15.0)
MCH: 31.3 pg (ref 26.0–34.0)
MCHC: 34.6 g/dL (ref 30.0–36.0)
MCV: 90.4 fL (ref 78.0–100.0)

## 2010-07-22 LAB — URINALYSIS, ROUTINE W REFLEX MICROSCOPIC
Bilirubin Urine: NEGATIVE
Glucose, UA: NEGATIVE mg/dL
Hgb urine dipstick: NEGATIVE
Ketones, ur: NEGATIVE mg/dL
Protein, ur: NEGATIVE mg/dL

## 2010-07-22 LAB — DIFFERENTIAL
Basophils Relative: 0 % (ref 0–1)
Monocytes Absolute: 0.7 10*3/uL (ref 0.1–1.0)
Monocytes Relative: 12 % (ref 3–12)
Neutro Abs: 2.5 10*3/uL (ref 1.7–7.7)

## 2010-07-22 LAB — SURGICAL PCR SCREEN: Staphylococcus aureus: NEGATIVE

## 2010-07-23 LAB — URINE CULTURE
Culture  Setup Time: 201204241403
Culture: NO GROWTH

## 2010-07-28 ENCOUNTER — Inpatient Hospital Stay (HOSPITAL_COMMUNITY)
Admission: RE | Admit: 2010-07-28 | Discharge: 2010-07-30 | DRG: 470 | Disposition: A | Discharge status: Home Health | Payer: Medicare Other | Source: Ambulatory Visit | Attending: Orthopedic Surgery | Admitting: Orthopedic Surgery

## 2010-07-28 DIAGNOSIS — D62 Acute posthemorrhagic anemia: Secondary | ICD-10-CM | POA: Diagnosis not present

## 2010-07-28 DIAGNOSIS — IMO0002 Reserved for concepts with insufficient information to code with codable children: Principal | ICD-10-CM | POA: Diagnosis present

## 2010-07-28 DIAGNOSIS — N289 Disorder of kidney and ureter, unspecified: Secondary | ICD-10-CM | POA: Diagnosis not present

## 2010-07-28 DIAGNOSIS — E669 Obesity, unspecified: Secondary | ICD-10-CM | POA: Diagnosis present

## 2010-07-28 DIAGNOSIS — F3289 Other specified depressive episodes: Secondary | ICD-10-CM | POA: Diagnosis present

## 2010-07-28 DIAGNOSIS — G4733 Obstructive sleep apnea (adult) (pediatric): Secondary | ICD-10-CM | POA: Diagnosis present

## 2010-07-28 DIAGNOSIS — F329 Major depressive disorder, single episode, unspecified: Secondary | ICD-10-CM | POA: Diagnosis present

## 2010-07-28 DIAGNOSIS — Z7982 Long term (current) use of aspirin: Secondary | ICD-10-CM

## 2010-07-28 DIAGNOSIS — K219 Gastro-esophageal reflux disease without esophagitis: Secondary | ICD-10-CM | POA: Diagnosis present

## 2010-07-28 DIAGNOSIS — Z9861 Coronary angioplasty status: Secondary | ICD-10-CM

## 2010-07-28 DIAGNOSIS — E039 Hypothyroidism, unspecified: Secondary | ICD-10-CM | POA: Diagnosis present

## 2010-07-28 DIAGNOSIS — M1710 Unilateral primary osteoarthritis, unspecified knee: Principal | ICD-10-CM | POA: Diagnosis present

## 2010-07-28 DIAGNOSIS — K279 Peptic ulcer, site unspecified, unspecified as acute or chronic, without hemorrhage or perforation: Secondary | ICD-10-CM | POA: Diagnosis present

## 2010-07-28 DIAGNOSIS — I2510 Atherosclerotic heart disease of native coronary artery without angina pectoris: Secondary | ICD-10-CM | POA: Diagnosis present

## 2010-07-28 DIAGNOSIS — Z8673 Personal history of transient ischemic attack (TIA), and cerebral infarction without residual deficits: Secondary | ICD-10-CM

## 2010-07-28 DIAGNOSIS — I10 Essential (primary) hypertension: Secondary | ICD-10-CM | POA: Diagnosis present

## 2010-07-28 HISTORY — PX: REPLACEMENT TOTAL KNEE: SUR1224

## 2010-07-28 LAB — TYPE AND SCREEN: ABO/RH(D): O POS

## 2010-07-28 LAB — ABO/RH: ABO/RH(D): O POS

## 2010-07-29 LAB — CBC
HCT: 31.7 % — ABNORMAL LOW (ref 36.0–46.0)
Hemoglobin: 11 g/dL — ABNORMAL LOW (ref 12.0–15.0)
MCH: 31.2 pg (ref 26.0–34.0)
MCHC: 34.7 g/dL (ref 30.0–36.0)
RDW: 13.2 % (ref 11.5–15.5)

## 2010-07-29 LAB — BASIC METABOLIC PANEL
BUN: 13 mg/dL (ref 6–23)
CO2: 28 mEq/L (ref 19–32)
Calcium: 8.9 mg/dL (ref 8.4–10.5)
Creatinine, Ser: 1.27 mg/dL — ABNORMAL HIGH (ref 0.4–1.2)
GFR calc non Af Amer: 42 mL/min — ABNORMAL LOW (ref >60)
Glucose, Bld: 125 mg/dL — ABNORMAL HIGH (ref 70–99)

## 2010-07-30 LAB — CBC
MCH: 31.2 pg (ref 26.0–34.0)
MCHC: 34.6 g/dL (ref 30.0–36.0)
Platelets: 154 10*3/uL (ref 150–400)
RDW: 13 % (ref 11.5–15.5)

## 2010-07-30 LAB — BASIC METABOLIC PANEL
CO2: 28 mEq/L (ref 19–32)
Calcium: 8.8 mg/dL (ref 8.4–10.5)
Creatinine, Ser: 1.09 mg/dL (ref 0.4–1.2)
GFR calc Af Amer: >60 mL/min (ref >60)
GFR calc non Af Amer: 50 mL/min — ABNORMAL LOW (ref >60)
Sodium: 132 mEq/L — ABNORMAL LOW (ref 135–145)

## 2010-08-12 NOTE — Discharge Summary (Signed)
NAMETEPHANIE, ESCORCIA                 ACCOUNT NO.:  [id number]   MEDICAL RECORD NO.:  [id number]          PATIENT TYPE:  INP   LOCATION:  3707                         FACILITY:  [facility]   PHYSICIAN:  Luis Abed, MD, FACCDATE OF BIRTH:  [birth year]   DATE OF ADMISSION:  05/18/2007  DATE OF DISCHARGE:  05/19/2007                               DISCHARGE SUMMARY   PROCEDURES:  1. Cardiac catheterization.  2. Coronary arteriogram.   PRIMARY FINAL DISCHARGE DIAGNOSIS:  Chest pain, nonobstructive coronary  artery disease at catheterization  with medical therapy recommended.   SECONDARY DIAGNOSES:  1. Status post Cypher stent to the right coronary artery in June 2007.  2. Preserved left ventricular function by echo.  3. History of gastric ulcers with heme-positive stool, likely      secondary to nonsteroidals.  4. Chronic kidney disease stage III with a GFR of 42 at discharge,      BUN 19, creatinine 1.27.  5. Anemia postprocedure, likely secondary to some blood loss during      procedure and blood draws with a hemoglobin of 10.9, hematocrit of      32.0 at discharge.  6. Hypotension at [facility], medications adjusted and symptoms      resolved.  7. Hypothyroidism.  8. Possible claudication symptoms.  9. History of hypertension.  10.Hyperlipidemia.  11.Status post laparoscopic cholecystectomy.  12.History of herpes simplex cold sores, on chronic acyclovir.  13.ALLERGY OR INTOLERANCE TO AVELOX, STATINS, LOPID AND ACE      INHIBITORS.  14.Family history of coronary artery disease.  15.Osteoarthritis.   Time of discharge 35 minutes.   HOSPITAL COURSE:  Ms. Arenivas is a 70 year old female with known coronary  artery disease.  She went to [facility] for chest pain and was  evaluated there by cardiology, who felt that cardiac catheterization was  indicated.  She was transferred to [facility] for  catheterization.   Cardiac catheterization was performed on  May 18, 2007, and  showed  a 30% left main, 30% LAD and a 40% RCA just distal to the previously  placed proximal stent.  There was no significant in-stent restenosis.  No left ventriculogram was performed because of her renal insufficiency.   The next day Ms. Peery was having no further episodes of chest pain.  Her  heart rate and blood pressure were stable.  Cardizem had been held and a  beta blocker had been added and she was tolerating these changes well.  Ms. Borneman was evaluated by Dr. [name] and considered stable for discharge  with outpatient followup arranged.   DISCHARGE INSTRUCTIONS:  Her activity level is to be increased  gradually.  She is to call our office for any problems with the cath  site.  She is not to do any lifting for a week and no driving for 2  days.  She is to stick to a low-sodium heart-healthy diet.  She is to  follow up with Dr. Andee Lineman on March 6 at 1:45 and with Dr. Orvan Falconer as  needed.   DISCHARGE MEDICATIONS:  1. Diltiazem CD  120 mg is on hold.  2. Acyclovir 400 mg a day.  3. Celexa 20 mg a day.  4. Synthroid 88 mcg daily.  5. Plavix 75 mg daily.  6. Zyrtec 10 mg a day.  7. Testosterone daily.  8. Aspirin 81 mg a day.  9. Fish oil 1000 mg a day.  10.Folic acid 800 mcg daily.  11.Calcium plus D 600 mg a day.  12.Nitroglycerin sublingual p.r.n.  13.Benicar HCT 40/25 is on hold.  14.Metoprolol ER 25 mg daily is new.      Theodore Demark, PA-C      Luis Abed, MD, [facility]  Electronically Signed    RB/MEDQ  D:  05/19/2007  T:  05/19/2007  Job:  [id number]   cc:   [facility] [place], [facility]

## 2010-08-12 NOTE — Discharge Summary (Signed)
NAMECHRISTAIN, MCRANEY                 ACCOUNT NO.:  [id number]   MEDICAL RECORD NO.:  [id number]          PATIENT TYPE:  OIB   LOCATION:  3707                         FACILITY:  [facility]   PHYSICIAN:  Luis Abed, MD, FACCDATE OF BIRTH:  1940/04/07   DATE OF ADMISSION:  05/18/2007  DATE OF DISCHARGE:  05/19/2007                               DISCHARGE SUMMARY   ADDENDUM   This is an addendum to the previously dictated discharge summary, date  of discharge May 19, 2007.   With ambulation, Mrs. Gorton had some chest pain described as an aching  under her right breast, in the midsternal area.  Her symptoms improved  by rest.  She was evaluated.  There were no acute physical findings.  Her symptoms have completely resolved.  I discussed the situation with  Ms. Palacios, and she also revealed that, after every meal she has diarrhea  with in one hour.  She states that she has had colonoscopies an EGDs in  the past and once an ulcer resolved, there were no significant  abnormalities.  I discussed the situation with Mrs. Mcafee and encouraged  her to restart a proton pump inhibitor she had taken in the past, and  use pain control medications on a p.r.n. basis.  I strongly recommended  that she keep her follow-up appointment with cardiology and also with  Dr. Dimas Aguas, whom she will see next week.  She was given additional  prescriptions for Vicodin 5/500 q.i.d. p.r.n. and Protonix 40 mg a day.  Mrs. Cabanilla was in agreement with this as the plan of care and will be  discharged home.      Theodore Demark, PA-C      Luis Abed, MD, [facility]  Electronically Signed    RB/MEDQ  D:  05/19/2007  T:  05/20/2007  Job:  [id number]   cc:   [facility] [place] [place] Loney Laurence

## 2010-08-12 NOTE — Assessment & Plan Note (Signed)
[facility]                          [name]   AVARIE, TAVANO                        MRN:          [id number]  DATE:06/22/2008                            DOB:          Nov 20, 1940    HISTORY OF PRESENT ILLNESS:  The patient is a pleasant 70 year old  female with systemic history of single-vessel coronary artery disease,  status post Cypher drug-eluting stent to the RCA in June 2007.  The  patient had a catheterization which showed nonobstructive coronary  artery disease in February 2009.  I recently saw her in the office on  June 08, 2008, with recurrent substernal chest pain.  The patient  reports exertional angina.  However, given her mild renal insufficiency  in the relative stability of her symptoms opted first proceed with a  stress test.  The Lexiscan was within normal limits with good ejection  fraction of 81% and no perfusion defects.  The patient also states that  seems to be started on Imdur 30 mg a day.  She has had no further  exertional chest pressure.  She had 2 brief episodes while lying down,  tightness in the chest, but they were different from her prior episodes  upon exertion.  Blood work was also reviewed today and the patient's  hemoglobin has been stable as well as her creatinine.   MEDICATIONS:  1. Zetia 10 mg a day.  2. Acyclovir 400 mg p.o. daily.  3. Citalopram 20 mg p.o. daily.  4. Synthroid 88 mcg p.o. daily.  5. Plavix 75 mg p.o. daily.  6. Protonix 40 mg p.o. daily.  7. Zyrtec 10 mg p.o. daily.  8. Aspirin 81 mg a day.  9. Folic acid 800 mg a day.  10.Calcium with vitamin D.  11.Multivitamin.  12.Iron.  13.Metoprolol succinate ER 25 mg half tablet p.o. daily.  14.Omega-3 fish oil.  15.Isosorbide mononitrate 30 mg p.o. daily.   LABORATORY WORK:  See above.   PROBLEM LIST:  1. Exertional angina, resolved.  2. Coronary artery disease.      a.     Status post Cypher drug-eluting stent to  the right coronary       artery in June 2007.      b.     Patent stent and nonobstructive coronary artery disease by       catheterization in February 2009.      c.     Negative Lexiscan for ischemia on June 19, 2008.  3. Normal left ventricular function.  4. Peptic ulcer disease.  Heme-positive stools in the past.  5. Chronic renal insufficiency, stable with a GFR of 45 mL/minute.  6. Dyslipidemia.      a.     Intolerant to multiple statins.  7. Treated hypothyroidism.  8. Severe obstructive sleep apnea.  9. Nonobstructive cerebrovascular disease by carotid Dopplers in      October 2008.  10.Hypertension.  11.History of stroke, status post Plavix therapy in October 2008.  12.Depression.  13.Obesity.   PLAN:  1. The patient is doing significantly better from angina perspective.  I did increase her Imdur further.  I have asked her to resume      complete normal activities.  If she does well and has no exertional      chest pain, we can continue her medical regimen.  2. I did tell the patient that if she has recurrent substernal chest      pressure during activity, the next step is to proceed with a      cardiac catheterization.  3. I increased also the Imdur partly for blood pressure control.  We      will recheck blood pressure before she leaves the office today to      make sure no additional therapy is required.      [name], MD,FACC  Electronically Signed    GED/MedQ  DD: 06/22/2008  DT: 06/22/2008  Job #: [id number]   cc:   Selinda Flavin

## 2010-08-12 NOTE — Assessment & Plan Note (Signed)
[facility]                          [name]   UVA, RUNKEL                        MRN:          [id number]  DATE:08/08/2008                            DOB:          10/02/1940    REFERRING PHYSICIAN:  Selinda Flavin   HISTORY OF PRESENT ILLNESS:  The patient is a 70 year old female with a  history of single-vessel coronary artery disease, status post Cypher  stent to the RCA in June 2007.  The patient, however, on the last visit  on June 22, 2008, reported symptoms of substernal chest pain possibly  consistent with angina.  We added Imdur to the patient's medical  regimen.  After this, she felt much better with resolution of her chest  pain symptom.  The patient states that she has been doing really well  short of an episode 2-3 weeks ago when she noticed that her blood  pressure suddenly rose to 197/79 which was associated with a headache  and subsequent lightheadedness.  She states that for approximately 2  hours, she was out of wits and does not recall very much of the  incident.  There was no associated chest pain or back pain.  Last night,  however, she experienced an episode which lasted well over half an hour  of sharp pain between both shoulder blades worsening with movement with  some associated shortness of breath.  However, this morning, she woke up  without any symptoms.  She states short of these 2 episodes, she is  doing fine.   MEDICATIONS:  1. Isosorbide mononitrate 60 mg p.o. daily.  2. Zetia 10 mg p.o. daily.  3. Acyclovir 400 mg p.o. daily.  4. Citalopram 200 mg p.o. daily.  5. Synthroid 88 mcg p.o. daily.  6. Plavix 75 mg p.o. daily.  7. Protonix 40 mg p.o. daily.  8. Zyrtec 10 mg p.o. daily.  9. Aspirin 81 mg p.o. daily.  10.Folic acid 800 mg p.o. daily.  11.Calcium.  12.Vitamin D.  13.Multivitamin.  14.Omega-3 fish oil.  15.Metoprolol succinate ER 25 mg p.o. daily.   PHYSICAL EXAMINATION:   VITAL SIGNS:  Blood pressure is 142/80, heart  rate 64, weight is 207 pounds.  GENERAL:  Well-nourished, white female, in no apparent distress.  HEENT:  Pupils; eyes are clear.  Conjunctivae clear.  NECK:  Supple.  Normal carotid upstroke and no carotid bruits.  LUNGS:  Clear breath sounds bilaterally.  HEART:  Regular rate and rhythm with normal S1 and S2.  No murmur, rubs,  or gallops.  ABDOMEN:  Soft, nontender.  No rebound or guarding.  Good bowel sounds.  EXTREMITIES:  No cyanosis, clubbing, or edema.  NEURO:  The patient is alert, oriented, and grossly nonfocal.   PROBLEM LIST:  1. Back pain and hypertension, rule out aortic dissection.  2. Exertional angina, resolved.  3. Coronary artery disease.      a.     Status post Cypher drug-eluting stent to the right coronary       artery in June 2007.      b.  Patent stent and nonobstructive coronary artery disease by       catheterization, February 2009.      c.     Negative Lexiscan for ischemia June 19, 2008.  4. Normal left ventricular function.  5. Peptic ulcer disease, heme positive stools in the past.  6. Renal insufficiency stable with glomerular filtration rate 45 mL      per minute.  7. Dyslipidemia.      a.     Intolerance to multiple statins.  8. Treated hypothyroidism.  9. Severe obstructive sleep apnea.  10.Nonobstructive cerebrovascular disease by carotid Dopplers, October      2008.  11.Hypertension.  12.History of stroke, status post Plavix therapy in October 2008.  13.Depression.  14.Obesity.   PLAN:  1. The patient's back pain between her shoulder blades and episodes of      hypertension, significant hypertension is concerning for aortic      dissection, although I think this is unlikely.  However, we have      postulated her diagnosis, I feel it needs to be ruled out.  I have      sent the patient today for a CT scan of the chest to rule out      aortic dissection.  2. From an angina perspective, the  patient is doing well.  She can      continue current regimen with isosorbide.  I do not think she needs      a catheterization at this point in time.  3. The patient can follow up with [place] in the next couple of weeks.      [name], MD,FACC  Electronically Signed    GED/MedQ  DD: 08/08/2008  DT: 08/09/2008  Job #: [id number]   cc:   Selinda Flavin

## 2010-08-12 NOTE — Cardiovascular Report (Signed)
Annette Sharp, Annette Sharp                 ACCOUNT NO.:  [id number]   MEDICAL RECORD NO.:  [id number]          PATIENT TYPE:  OIB   LOCATION:  2807                         FACILITY:  [facility]   PHYSICIAN:  Veverly Fells. Excell Seltzer, MD  DATE OF BIRTH:  1941-03-20   DATE OF PROCEDURE:  05/18/2007  DATE OF DISCHARGE:                            CARDIAC CATHETERIZATION   PROCEDURE:  Left heart catheterization, selective coronary angiography.   INDICATIONS:  Annette Sharp is a 70 year old woman who presented to [facility] with a suspected acute coronary syndrome.  She ruled out for  myocardial infarction.  However, she has a history of CAD and prior  right coronary artery stenting in 2007.  Her symptoms were typical for  angina and she was referred for cardiac catheterization.   Risks and indications of procedure were reviewed with the patient.  Informed consent obtained.  The right groin was prepped, draped,  anesthetized with 1% lidocaine.  Using modified Seldinger technique 6-  French sheath was placed in the right femoral artery.  Standard 6-French  Judkins catheters were used for coronary angiography.  The left coronary  artery was difficult to engage.  I attempted a JL-4 and JL-3.5  diagnostic catheter.  Both appeared to be too long.  Ultimately I was  able to engage the vessel with a 3 cm JL-4 guide catheter.  A standard  JR-4 diagnostic catheter was used for the RCA.  A pullback across the  aortic valve was done and left ventricular pressure was recorded.   LEFT VENTRICULOGRAPHY:  Was deferred because of renal insufficiency.   FINDINGS:  Aortic pressure 103/55 with a mean of 74, left ventricular  pressure 104/10.   CORONARY ANGIOGRAPHY:  The left mainstem is short.  There is mild  diffuse disease of approximately 30% in the proximal left main.  The  left main bifurcates into the LAD and left circumflex.   The LAD is a moderate caliber vessel that courses down and wraps around  the LV apex.   Proximal portion of the LAD has a 30% stenosis; the mid  portion of the LAD just after the first diagonal branch also has a 30%  stenosis.  The remaining portions of the mid and distal LAD have only  minor nonobstructive disease.  The first diagonal is a small branch,  second diagonal is moderate size branch.  There is no significant  stenosis in the diagonal branches of the LAD.   Left circumflex.  There is a tiny intermediate branch.  The left  circumflex courses down and supplies 2 lower OM branches that run in the  territory of the OM-2 and OM3.  There is no significant stenosis  throughout the left circumflex.  The branch vessels of the circumflex  are very small.   Right coronary artery has a heavily calcified origin.  There is a widely  patent stent in the proximal RCA just after the stented segment.  The  RCA takes a sharp curve and there is a 40% stenosis in that region.  The  mid and distal portions of the RCA have  minor nonobstructive disease.  There is a segment of smooth 40% stenosis in the mid vessel.  There is a  right PDA and posterolateral branch as well as a small acute marginal  branch.   LEFT VENTRICULOGRAPHY:  Was deferred secondary to renal insufficiency.   ASSESSMENT:  1. Widely patent right coronary artery stent.  2. Nonobstructive coronary artery disease.   PLAN:  Recommend continued medical therapy for the patient's coronary  artery disease.  There are no areas of high-grade stenosis that warrant  PCI.      Veverly Fells. Excell Seltzer, MD  Electronically Signed     MDC/MEDQ  D:  05/18/2007  T:  05/19/2007  Job:  [id number]   cc:   Annette Codding, MD,FACC

## 2010-08-12 NOTE — Assessment & Plan Note (Signed)
[facility]                          [name]   MELVIA, MATOUSEK                        MRN:          [id number]  DATE:06/03/2007                            DOB:          September 04, 1940    CARDIOLOGIST:  Dr. Lewayne Bunting.   PRIMARY CARE PHYSICIAN:  Dr. Selinda Flavin   REASON FOR VISIT:  Posthospitalization followup.   HISTORY OF PRESENT ILLNESS:  Ms. Flippo is a very pleasant 70 year old  female patient with a history of single-vessel CAD, status post Cypher  drug-eluting stent placement to the RCA in June 2007, who was recently  evaluated at [facility] with complaints of chest pain.  She ruled  out for myocardial infarction by enzymes.  However, her symptoms were  quite suggestive of exertional angina pectoris.  She underwent an  echocardiogram which revealed an EF of 60% to 65%.  She was hydrated  secondary to renal insufficiency and transferred to [facility]  for cardiac catheterization.   Cardiac catheterization was performed by Dr. Excell Seltzer on February 18; this  revealed nonobstructive CAD.  Specifically, she had a 30% proximal left  main stenosis, 30% proximal LAD stenosis and 30% mid LAD stenosis.  Her  circumflex was free of significant disease.  Her RCA showed a patent  stent with 40% proximal stenosis and 40% mid stenosis.  Continued  medical therapy was recommended.   Prior to leaving the hospital, the patient developed recurrent chest  pain.  It was discovered that she had a history of peptic ulcer disease  in the past with heme-positive anemia and she was placed on Protonix 40  mg a day at discharge.   In the office today, the patient notes that she is doing well.  She  continues to have symptoms of chest pain with lying supine.  She denies  any exertional chest pain.  She does have exertional shortness of  breath.  She describes NYHA class II symptoms.  She denies orthopnea or  PND.  She denies any  significant pedal edema.  She denies any syncope.  She notes that her right femoral arteriotomy site is without pain or  drainage.   CURRENT MEDICATIONS:  1. Zetia 10 mg daily.  2. Acyclovir 400 mg daily.  3. Citalopram 20 mg daily.  4. Synthroid 88 mcg daily.  5. Plavix 75 mg daily.  6. Protonix 40 mg daily.  7. Metoprolol ER 25 mg daily.  8. Zyrtec 10 mg daily.  9. Aspirin 81 mg daily.  10.Fish oil 1 g daily.  11.Folic acid 800 mg daily.  12.Calcium plus vitamin D 6 mg daily.  13.Multivitamin plus iron daily.   ALLERGIES AND INTOLERANCES:  AVELOX causes rash.  STATINS caused  myalgias.  LOPID caused myalgias.  ACE INHIBITORS caused cough.   PHYSICAL EXAM:  She is a well-nourished, well-developed female in no  acute distress.  Blood pressure is 132/79, pulse 57, weight 188.6 pounds.  HEENT:  Normal.  NECK:  Without JVD.  CARDIAC:  Normal S1 and S2, regular rate and rhythm.  LUNGS:  Clear to auscultation  bilaterally  ABDOMEN:  Soft and nontender.  EXTREMITIES:  Without edema.  NEUROLOGIC:  She is alert and oriented x3.  Cranial II-XII are grossly  intact.  VASCULAR:  Right femoral arteriotomy site without hematoma or bruit.   DATA BASE:  The patient underwent ABIs prior to transfer to [facility] to rule out significant peripheral arterial disease; these were  normal.   IMPRESSION:  1. Noncardiac chest pain.      a.     Question secondary to gastroesophageal reflux disease.  2. Coronary artery disease.      a.     Status post Cypher drug-eluting stent placed into right       coronary artery, June 2007.      b.     Patent stent and nonobstructive coronary artery disease by       catheterization, February 2009.  3. Good left ventricular function.  4. Peptic ulcer disease with history of heme-positive anemia in the      past.  5. Chronic renal insufficiency.  6. Dyslipidemia.      a.     Intolerant to multiple statins.  7. Treated hypothyroidism.  8. Severe  obstructive sleep apnea.      a.     Noncompliant with continuous positive airway pressure -- to       be restarted soon.  9. Nonobstructive cerebrovascular disease by carotid Dopplers, October      2008.  10.Hypertension.  11.History of cerebrovascular accident.      a.     Plavix initiated, October 2008.  12.Depression.  13.Obesity.   DISCUSSION:  Ms. Virgen returns to the office today for post-  hospitalization.  Her chest pain seems to be of a noncardiac nature.  With recurrence of pain with lying supine, it sounds as though she may  be developing symptoms from gastroesophageal reflux disease.  She was  placed on Protonix prior to discharge.  She also was noted to be  slightly anemic at discharge with a hemoglobin of 10.9.  She had her  diltiazem discontinued secondary to hypotension at [facility] and  low-dose beta blocker was initiated.  She notes that since the beta  blocker was initiated, she has felt better than she has felt in quite  some time.  I suspect that a lot of her dyspnea with exertion is  probably multifactorial and related mainly to deconditioning.  She is  somewhat bradycardic today.  However, she is fairly asymptomatic with  this.   PLAN:  1. Increase her Protonix to 40 mg b.i.d.  2. Decrease her metoprolol to 1/2 tablet daily.  3. Refer her to Gastroenterology for further recommendations regarding      possible chest pain secondary to gastroesophageal reflux disease.  4. We will check a BMET study to follow up on her renal function and      potassium, as she does have a history of renal insufficiency.  We      will also recheck a CBC, since she does have a history of heme-      positive anemia as well as a low hemoglobin prior to discharge from      [facility].  5. We will have her return in 3 months for routine followup.      Tereso Newcomer, PA-C  Electronically Signed      [name], MD,FACC  Electronically Signed   SW/MedQ   DD: 06/03/2007  DT: 06/05/2007  Job #: [id number]  cc:   Selinda Flavin

## 2010-08-12 NOTE — Assessment & Plan Note (Signed)
[name]                          [name]   Annette Sharp, Annette Sharp                        MRN:          [id number]  DATE:06/08/2008                            DOB:          [birth year]    REFERRING PHYSICIAN:  Selinda Flavin   HISTORY OF PRESENT ILLNESS:  The patient is a pleasant 70 year old  female with a history of single-vessel coronary artery disease, status  post Cypher drug-eluting stent to the RCA in June 2007.  The patient is  having followup nonobstructive coronary arteries by catheterization  February 2009.   However, the patient states that here recently, particularly couple of  weeks ago for about a duration of 2 weeks, she had substernal chest  pressure, which was radiating to both sides into the back and into the  left shoulder.  This occurred during minimal activity.  She states this  pain was occurring off and on, typically lasting 10-15 minutes.  There  was no associated shortness of breath or diaphoresis.  She also reports  that with bigger activity she becomes very dyspneic with a pressure-  like sensation in the chest and radiation to the back very similar to  her episodes back in 2007.  She denies, however any resting angina,  orthopnea, or PND.  She states that her family has noted, at times she  turns white and has some diaphoresis with her chest pain.   The 12-lead electrocardiogram in the office was reviewed and  demonstrates normal sinus rhythm with no acute ischemic changes.   MEDICATIONS:  1. Zetia 10 mg p.o. daily.  2. Acyclovir 400 mg p.o. daily.  3. Citalopram 20 mg p.o. daily.  4. Synthroid 88 mcg p.o. daily.  5. Plavix 75 mg p.o. daily.  6. Protonix 40 mg p.o. daily.  7. Zyrtec 10 mg p.o. daily.  8. Aspirin 81 mg p.o. daily.  9. Folic acid 800 mg p.o. daily.  10.Calcium and vitamin D.  11.Multivitamin.  12.Metoprolol succinate ER 25 mg half a tab p.o. daily.  13.Omega-3 1200 mg p.o.  daily   PHYSICAL EXAMINATION:  VITAL SIGNS:  Blood pressure is 143/83, heart  rate is 63, and weight is 206 pounds.  NECK:  Normal carotid upstroke with no carotid bruits.  LUNGS:  Clear breath sounds bilaterally.  HEART:  Regular rate and rhythm.  Normal S1 and S2.  There are no  pathological murmurs.  ABDOMEN:  Soft and nontender with no rebound or guarding.  Good bowel  sounds.  EXTREMITIES:  Demonstrates no cyanosis, clubbing, or edema.   PROBLEM LIST:  1. Exertional angina.  2. Coronary artery disease.      a.     Status post Cypher drug-eluting stent to right coronary       artery June 2007.      b.     Patent stent and nonobstructive coronary artery disease by       catheterization February 2009.  3. Normal left ventricular function.  4. Peptic ulcer disease with history of heme-positive stools in the      past.  5. Chronic renal sufficiency.  6. Dyslipidemia.      a.     Intolerant to multiple statins.  7. Treated hypothyroidism.  8. Severe obstructive sleep apnea, noncompliant with continuous      positive airway pressure.  9. Nonobstructive cerebrovascular disease by carotid Dopplers October      2008.  10.Hypertension.  11.History of stroke status post Plavix therapy October 2008.  12.Depression.  13.Obesity.   PLAN:  1. The patient's symptoms are very concerning for angina.  The patient      does have mild renal insufficiency, although this is not completely      prohibitive for cardiac catheterization.  However, we will start      her initially on Imdur and give her nitroglycerin spray and will      proceed with stress testing.  If there is significant ischemia we      will proceed with cardiac catheterization.  2. The patient also will need laboratory work done to make sure that      she is not anemic.  Given her prior history.  Last lab work was      done was on December 09, 2007.  If Dr. Dimas Aguas has done more recent      lab work done then this will  suffice.  3. We will see the patient back in 2 weeks and we will make final      decision whether she needs a cardiac catheterization.     [name], MD,FACC  Electronically Signed    GED/MedQ  DD: 06/08/2008  DT: 06/08/2008  Job #: [id number]   cc:   Selinda Flavin

## 2010-08-15 NOTE — Assessment & Plan Note (Signed)
[facility]                           GASTROENTEROLOGY OFFICE NOTE   ANGELES, ZEHNER                        MRN:          [id number]  DATE:01/05/2006                            DOB:          [birth year]    REFERRING PHYSICIAN:  Selinda Flavin, M.D.   REASON FOR CONSULTATION:  Blood in stool, diarrhea, abdominal pain.   ASSESSMENT:  This is a 70 year old white woman with chronic diarrhea,  nausea, vomiting and abdominal pain.  Etiology not entirely clear.  She has  anemia as well.  She had gastric ulcers on EGD in June when she was  hospitalized at [facility] and had percutaneous  coronary intervention to the right coronary artery and had a drug eluting  stent place.   At this time, she has had persistent problems of unclear etiology.  She did  have a colonoscopy in [place], [place], I do not have those results.  We  do not know if she had biopsies.   RECOMMENDATIONS AND PLAN:  Because of the persistent problems in this  complicated patient, a repeat upper GI endoscopy to ensure resolution of the  ulcers and a colonoscopy with random biopsy and terminal ileum intubation,  will be undertaken.  Etiologies include missed neoplasm, other cancer,  chronic ischemia, inflammatory bowel disease.   Risks, benefits and indications of upper GI endoscopy and colonoscopy has  been reviewed.  Note that she is currently off her Plavix as directed by Dr.  Orvan Falconer, apparently Dr. Andee Lineman has been made aware of this, she does have a  drug eluting stent.  She will remain on her aspirin for her procedures.  Will also check CBC and a CMET today too.   HISTORY:  A pleasant 70 year old white woman who started having problems  sometime earlier this year with intermittent spells of abdominal pain,  nausea, vomiting and diarrhea.  Eventually they got severe.  She was  hospitalized for coronary problems in June as mentioned above and had the  Cypher stent placed and had multiple ulcers in the antrum biopsied by Dr.  Juanda Chance, September 17, 2005, and these were benign.  She was then hospitalized in  [place], [place], at [facility] because of acute pain, had  diarrhea and bleeding and things resolved.  She was treated conservatively  by Dr. Orvan Falconer.  She had her Plavix stopped at that time.  Things seemed to  calm down but she still has intermittent spells of abdominal pain with  severe diarrhea, nausea and vomiting that have not been explained at this  point. Dr. Dimas Aguas ordered a 24-hour urinary 5HIA which was negative.  We  have labs from August with a hemoglobin of 10, MCV 84.  She has had multiple  stools for occult blood that had been negative as an outpatient but  subsequent to those August studies, she started passing bright red blood and  clots again into the commode.  She has infraumbilical abdominal pain after  she defecates.  At one point she was admitted and was told she had an  impaction.  She  had previously had trouble with chronic constipation and had  stopped her MiraLax because of diarrhea that had been a problem.  Dr.  Orvan Falconer told her to resume her MiraLax and take it even if she had diarrhea  and to take iron.  She followed up with Dr. Dimas Aguas who replaced Dr. Orvan Falconer.  He stopped her MiraLax and iron at this point.  Appetite is off.  She does  feel somewhat better on Protonix.  She is not clear about the details of the  colonoscopy she had in [place], [place], in May or so.  It sounds like  that was performed by a surgeon and we do not have those records but will  request them.   MEDICATIONS:  1. Benicar 40/25 mg daily.  2. Synthroid 100 mcg daily.  3. Citalopram 20 mg daily.  4. Zyrtec 10 mg daily.  5. Protonix 40 mg b.i.d.  6. Diltiazem 120 mg daily.  7. Acyclovir 400 mg daily.  8. Aspirin 81 mg daily.  9. Calcium with vitamin D 600 mg daily.  10.Fish oil 1000 mg each day.  11.Folic acid 800  mg daily.   ALLERGIES:  She is sensitive or allergic to STATINS.   PAST MEDICAL HISTORY:  1. HSV.  2. Hypertension.  3. Coronary artery disease, status post PCI and Cypher stenting this      summer.  4. Dyslipidemia.  5. Hypothyroidism.  6. Osteoarthritis.  7. Depression.  8. Allergies.  9. Sleep apnea.  10.Prior hysterectomy.  11.History of transient ischemic attacks.   FAMILY HISTORY:  Positive for ovarian cancer in her mother; heart disease in  mother, father, and sister.   SOCIAL HISTORY:  She is married.  She is a disabled sewing Location manager.  She has two daughters.  Lives with her husband.  No alcohol, tobacco or  drugs.   REVIEW OF SYSTEMS:  See my medical history for full details on that.  It is  reviewed.   PHYSICAL EXAMINATION:  GENERAL APPEARANCE:  An obese, pleasant, middle-aged  white woman looking slightly older than stated age.  She is in no acute  distress.  VITAL SIGNS:  Height 5 feet 2-1/2 inches, weight 194 pounds, blood pressure  100/50, pulse 68.  HEENT:  Eyes anicteric.  ENT shows a torus palatinus but otherwise normal  posterior pharynx and mouth except for missing some teeth and some repaired.  NECK:  Supple, no thyroid mass.  CHEST:  Clear.  CARDIOVASCULAR:  S1 and S2, no S3, S4 or gallops.  ABDOMEN:  Some mild right upper quadrant tenderness to deep palpation  without hepatosplenomegaly or mass.  She is tender to a moderate degree in  the left lower quadrant with some minimal guarding to deep palpation.  RECTAL:  Exam is deferred.  LYMPHATIC:  No neck or supraclavicular nodes.  EXTREMITIES:  No edema.  SKIN:  No acute rash noted.  PSYCHIATRIC:  She is alert and oriented x3.   DATA REVIEWED:  Labs from Dr. Dimas Aguas, discharge summary from [facility] office notes from Dr. Dimas Aguas and Dr. Orvan Falconer and summary of x-ray  reports in the discharge summaries and I will request a copy of the  colonoscopy report.  I appreciate the  opportunity to care for this patient.   ADDENDUM:  She has had a cholecystectomy in 2005.         Iva Boop, MD,FACG     Job# [id number]  CEG/MedQ  DD:  01/05/2006  DT:  01/07/2006  Job #:  [id number]

## 2010-08-15 NOTE — Consult Note (Signed)
NAMEGEOFFREY, MANKIN                 ACCOUNT NO.:  [id number]   MEDICAL RECORD NO.:  [id number]          PATIENT TYPE:  INP   LOCATION:  3704                         FACILITY:  [facility]   PHYSICIAN:  Dyke Maes, M.D.DATE OF BIRTH:  [birth year]   DATE OF CONSULTATION:  09/16/2005  DATE OF DISCHARGE:                                   CONSULTATION   REASON FOR CONSULTATION:  Chronic renal insufficiency.   HISTORY OF PRESENT ILLNESS:  This is a 70 year old white female who was  admitted today to undergo elective heart catheterization for an angioplasty  of her RCA.  The procedure has been cancelled because her serum creatinine  had gone from 1.6 last week to 1.9 as of today.  The patient had undergone  heart catheterization last Friday which showed a 75% RCA lesion.  She was  discharged home to follow up today for the angioplasty.  On review of her  old medical records, her serum creatinine in June 2003 was 1.4; in August  2003, 1.9; on September 07, 2005, 1.6; on September 16, 2005, 1.9.  She denies any  history of gross hematuria, kidney stones, or family history of renal  disease.  She does have a history of hypertension for at least 27 years,  which she says has not been well controlled until she was placed on Benicar  last April.  She does have a history of nonsteroidal use of over 25 years,  utilizing ibuprofen, Naprosyn, Vioxx, and most recently Relafen.   PAST MEDICAL HISTORY:  1.  Hypertension.  2.  Hyperlipidemia.  3.  Coronary artery disease.  4.  Status post laparoscopic cholecystectomy.  5.  History of hypothyroidism.  6.  She takes acyclovir for prevention of cold sores.   ALLERGIES:  1.  AVELOX causes a rash.  2.  STATINS and LOPID cause muscle aches.  3.  ACE INHIBITORS cause cough.   MEDICATIONS:  1.  Relafen 750 mg b.i.d.  2.  Synthroid 100 mcg daily.  3.  Diltiazem XR 240 mg a day.  4.  Benicar/hydrochlorothiazide 40/25 mg a day.  5.  Citalopram 20 mg a day.  6.   Acyclovir 400 mg a day.  7.  Zyrtec 10 mg a day.  8.  Plavix 75 mg a day.  9.  Elavil 50 mg a day.  10. Aspirin 1 a day.   SOCIAL HISTORY:  She is a nonsmoker, nondrinker.  She is married.  She has  two children.  She lives in [place].  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   FAMILY HISTORY:  Mother, father, and sister with high blood pressure and  coronary artery disease.  Negative for renal disease.   REVIEW OF SYSTEMS:  No recent change of vision, no shortness of breath.  She  describes chest tightness, shortness of breath, and weakness with exertion.  She also describes a vague abdominal pain which is different from her chest  discomfort after eating.  She denies any recent change in bowel habits.  No  neuropathic symptoms.  She has chronic arthritis.   PHYSICAL EXAMINATION:  VITAL SIGNS:  Blood pressure 122/48, pulse 70,  temperature 97.5.  GENERAL:  A 70 year old white female in no acute distress.  HEENT:  Sclerae anicteric.  Extraocular muscles are intact.  NECK:  Reveals no JVD.  LUNGS:  Clear to auscultation.  HEART:  Regular rate and rhythm, without murmur, rub, or gallop.  ABDOMEN:  Positive bowel sounds, nontender, nondistended.  No  hepatosplenomegaly.  No bruits.  EXTREMITIES:  No clubbing, cyanosis, or edema.  There is no evidence of  cholesterol emboli.  Pulses are 2/4 in the carotids, radials, femorals, 1/4  in posterior tibial and dorsalis pedis.  NEUROLOGIC:  Cranial nerves intact.  Motor and sensory intact.  No  asterixis.   LABORATORIES:  Sodium 138, potassium 4.9, creatinine 1.9.  Urinalysis is  completely benign.  Hemoglobin 11.7, white count 6.6.  Chest x-ray is  negative.   IMPRESSION:  Chronic renal insufficiency secondary to hypertension and long-  term nonsteroidal use.  It is not clear to me whether the serum creatinine  of 1.6 to 1.9 means anything yet.  We will need to see what her serum  creatinine looks like tomorrow.   If it is higher, then she certainly could  have a component of contrast nephropathy.  If her serum creatinine is 1.9 or  less tomorrow, then I think you could proceed with a heart catheterization.  I discussed the risks of cholesterol emboli, approximately 1%, and contrast  nephropathy (of which there is a 20-30% chance of serum creatinine rising  but returning to normal without sequelae, and a less than 5% chance of  requiring hemodialysis).   RECOMMENDATIONS:  1.  I agree with IV hydration.  2.  I agree with holding Benicar.  3.  I would recommend treating with Mucomyst prior to the next heart      catheterization, and consideration could be given to IV bicarbonate      prior to the heart catheterization.  4.  I would recommend discontinuing the Relafen if she can tolerate this      from her arthritis standpoint.  5.  I would recheck serum creatinine in the morning.  6.  Await renal ultrasound report.   Thank you very much for the consult.  I will follow the patient with you.           ______________________________  Dyke Maes, M.D.     MTM/MEDQ  D:  09/16/2005  T:  09/16/2005  Job:  [id number]

## 2010-08-15 NOTE — Cardiovascular Report (Signed)
NAMECLARISE, Sharp                 ACCOUNT NO.:  [id number]   MEDICAL RECORD NO.:  [id number]           PATIENT TYPE:   LOCATION:                                 FACILITY:   PHYSICIAN:  Annette R. Juanda Chance, MD, FACCDATE OF BIRTH:  10-20-1940   DATE OF PROCEDURE:  09/18/2005  DATE OF DISCHARGE:                            CARDIAC CATHETERIZATION   PROCEDURE:  The procedure was performed via right femoral arteries and  arterial sheath and a JR-4 6-French guiding catheter with side holes.  The patient given Angiomax bolus and infusion and was loaded with Plavix  and aspirin.  We passed a Prowater wire down the vessel without  difficulty.  We used an Atlantis IVUS catheter and passed the catheter  across the lesion and did automatic pullback.  This demonstrated a tight  stenosis and we elected to proceed with intervention.  We first went in  with a 3.25 by cutting balloon and then we deployed a 3.5 x 8 mm Cypher  stent in the right coronary ostium.  We then postdilated with a 3.75 x 8  mm Quantum Maverick.  For details of pressures and duration, see the  catheterization laboratory log.  Final diagnostic study was then  performed through the guiding catheter.  The patient tolerated the  procedure well and left the laboratory in satisfactory condition.   RESULTS:  The IVUS reading showed a minimal lumen area of 2.78 mm2 and a  reference lumen area 15.47 mm2 giving an area of stenosis of 82%.  Initially the stenosis angiographically appeared to be 80% and following  stenting this improved to 0%.   CONCLUSION:  Successful percutaneous coronary intervention of the ostial  lesion of the right coronary using IVUS guidance and a drug-eluting  stent with improvement percent of narrowing from 80% to 0%.      Annette Elvera Lennox Juanda Chance, MD, [facility]  Electronically Signed     BRB/MEDQ  D:  06/01/2007  T:  06/02/2007  Job:  [id number]

## 2010-08-15 NOTE — Assessment & Plan Note (Signed)
[facility]                            [name]   Annette Sharp, Annette Sharp                        MRN:          [id number]  DATE:10/13/2005                            DOB:          Aug 19, 1940    REFERRING PHYSICIAN:  Dr. Marcelino Duster.   HISTORY OF PRESENT ILLNESS:  The patient is a 70 year old female with a  history of recent Cypher stent placement at the end of June, 2007.  The  patient has single vessel coronary artery disease and received a stent to  the right coronary artery.  She also has some renal insufficiency with a  creatinine of 1.9.  The patient reportedly was taking Relafen at that time,  and this was complicated by guaiac positive stools and found to have  multiple peptic ulcers by endoscopy.  The patient came to see me on October 06, 2005 when she still complained of extraordinary weakness.  She did appear to  be anemic clinically.  CBC from that day demonstrated a hemoglobin of 11.4  and a platelet count of 449.  She also had guaiac positive stools.  Her BUN  and creatinine were stable with a BUN of 32 and a creatinine of 1.8.  Sodium  was 134 and potassium 4.3.  The patient states that overall, she is feeling  slightly better.  Of note also is that Dr. Orvan Falconer got the patient's  Diltiazem from 240 mg to 120 mg daily, given her relatively low blood  pressure.  The patient states that she has not seen any clinical bleeding.   MEDICATIONS:  1.  Diltiazem XR 120 mg p.o. daily.  2.  Synthroid 100 mcg p.o. daily.  3.  Benicar/hydrochlorothiazide 40/25 p.o. daily.  4.  Citalopram 20 mg daily.  5.  Acyclovir.  6.  Zyrtec.  7.  Plavix 75 mg a day.  8.  Protonix.  9.  Multivitamins with iron.   PHYSICAL EXAMINATION:  VITAL SIGNS:  Blood pressure 110/70.  Heart rate is  90 beats per minute.  Weight is 201 pounds.  NECK:  No carotid bruits.  LUNGS:  Clear.  HEART:  Regular rate and rhythm.  Normal S1 and S2.  ABDOMEN:   Soft.  EXTREMITIES:  No edema.   PROBLEM LIST:  1.  Coronary artery disease.      1.  Status post Cypher stent to the right coronary artery, June, 2007.      2.  No recurrence of __________ chest pain.  2.  Chronic renal insufficiency.  Creatinine 1.8.  3.  History of recent gastrointestinal bleeding.  Multiple ulcers found on      endoscopy and guaiac positive stools.  4.  Dizziness and weakness.  5.  Anemia.  Hemoglobin 11.4.   PLAN:  1.  The patient's anemia appears to be stable.  Fortunately, we can continue      her on aspirin and Plavix.  She does still have guaiac positive stools.      I have asked the patient to repeat her CBC in four weeks as well as  guaiac cards at that time also.  2.  I do think the patient's hemoglobin is stable so that no changes have to      be made in her aspirin and Plavix regimen; however, it is concerning      that she still has guaiac positive stools, and I will ask Dr. Orvan Falconer to      follow the patient for this.  I have also given her a prescription for      iron t.i.d.  3.  Of note is that the patient's renal function is also slightly      deteriorated from 1.5 to 1.8.  She has relatively low blood pressure,      and it may be worthwhile stopping her Diltiazem 120 mg a day in the next      couple of weeks if she continues to run blood pressures of 110/70.  I      discussed with the patient, and she will further discuss this with Dr.      Orvan Falconer.                                   [name], MD, [facility]   GED/MedQ  DD:  10/13/2005  DT:  10/13/2005  Job #:  [id number]   cc:   Marcelino Duster

## 2010-08-15 NOTE — Letter (Signed)
October 22, 2008    Marlowe Kays, MD  [address]  [place], [place] [address]   RE:  Annette, Sharp  MRN:  [id number]  /  DOB:  Nov 14, 1940   Dear Fayrene Fearing:   I appreciate your referral for a cardiac evaluation of [name] for  possible total knee replacement.  I saw the patient in May 2010.  As you  know, she is a patient with history of single-vessel coronary artery  disease status post Cypher stent to the RCA in June 2007.  The patient  did report some chest pain which improved with nitroglycerin.  However,  she had negative Lexiscan on June 19, 2008, with normal LV function.  She also had a patent stent and nonobstructive coronary artery disease  by catheterization as recently as February 2009.  I do not think that  the patient's risk for total knee replacement is prohibitive.  She does  have additional comorbidities with renal insufficiency, hypertension,  history of stroke and is currently still on Plavix therapy, the latter  which needs to be stopped for surgery.  She also has severe obstructive  sleep apnea.  Overall, though I think her risk for cardiac complications  is small and if the patient will have significant symptom relief and  improved quality of life with a total knee replacement, then I certainly  think this would be the right thing to do.  As far as her other  comorbidities, there is some increased risk given her obstructive sleep  apnea and obesity, but I think this is predominately a NST problem for  the anesthesiologist.  I will notify the patient that I  have cleared her for surgery from my perspective, and I do not think she  needs an additional office visit prior to the procedure.  If you have  any further questions regarding this patient, please do not hesitate to  contact me at [phone], which is my cell phone.    Sincerely,      [name], MD,FACC  Electronically Signed    GED/MedQ  DD: 10/22/2008  DT: 10/23/2008  Job #: [id number]   CC:    Selinda Flavin

## 2010-08-15 NOTE — Cardiovascular Report (Signed)
NAMEJOURDEN, Annette Sharp                 ACCOUNT NO.:  [id number]   MEDICAL RECORD NO.:  [id number]          PATIENT TYPE:  OIB   LOCATION:  1963                         FACILITY:  [facility]   PHYSICIAN:  Vida Roller, M.D.   DATE OF BIRTH:  09-29-1940   DATE OF PROCEDURE:  09/11/2005  DATE OF DISCHARGE:                              CARDIAC CATHETERIZATION   PRIMARY CARE PHYSICIAN:  Marcelino Duster, M.D.   CARDIOLOGIST:  Jonelle Sidle, M.D.   HISTORY OF PRESENT ILLNESS:  [name] is a 70 year old woman with no  significant coronary artery disease who presents with chest discomfort.  She  has hypertension and chronic renal insufficiency.  This was an evaluation  for ischemia.   PROCEDURES:  1.  Left heart catheterization  2.  Selective coronary angiography.  No left ventriculogram was performed.   DETAILS OF THE PROCEDURE:  After obtaining informed consent, the patient was  brought to the cardiac catheterization laboratory in the fasting state.  There, she was prepped and draped in the usual sterile manner.  Local  anesthetic was obtained of the right groin using 1% lidocaine without  epinephrine.  The right femoral artery was cannulated using the modified  Seldinger technique with a 4-French 10-cm sheath, and left heart  catheterization was performed using a 4-French Judkins left #4 and a 4-  [place] 3-DRC catheter, and also a Judkins left #3.5 which successfully  seeded the left main coronary artery.   At the conclusion of the procedure, the catheters were removed.  The patient  was moved back to the cardiology holding area, and the femoral artery sheath  was removed.  Hemostasis was obtained using direct __________ pressure.  At  the conclusion of the hold, there was no evidence of ecchymosis or hematoma  formation.  Distal pulses were intact.  Total fluoroscopic time was 1.4  minutes.  Total ionized contrast use was 100 cc.   RESULTS:  Aortic pressure 121/59 with a mean of  83 mmHg.   Selective coronary angiography.  The left main coronary artery is short but  moderate caliber artery which is which is angiographically unremarkable.   The left anterior descending coronary artery is a moderate caliber vessel  which does not traverse the apex.  It has 2 moderate caliber diagonals.  There is a 25% stenosis in the proximal portion of the artery which is  nonobstructive.   The left circumflex coronary artery is a small nondominant vessel which has  a small first obtuse marginal and a small second obtuse marginal with 2  small posterior lateral branches and is without significant obstructive  disease.   The right coronary artery is a moderate caliber dominant vessel which has a  calcified ostium which dampens upon engagement.  There appears to be about a  75% ostial stenosis.  The remainder of the coronary artery was without  significant stenosis.  The posterior descending coronary artery is moderate  caliber without significant stenosis.   No left ventriculogram was performed.   ASSESSMENT:  Significant obstructive single vessel coronary disease in the  ostium of the  right coronary artery, normal left heart pressures.   PLAN:  This is a lady who would probably benefit from referral for possible  percutaneous revascularization of the ostium of her right coronary artery.  We will do that after she is successfully hydrated, perhaps next week.  Bring her back after she has been loaded with Plavix, as well.      Vida Roller, M.D.  Electronically Signed     JH/MEDQ  D:  09/11/2005  T:  09/11/2005  Job:  [id number]   cc:   Marcelino Duster  Fax: [phone]   Jonelle Sidle, M.D. [facility]  [address]  [place]  [place] [address]

## 2010-08-15 NOTE — Assessment & Plan Note (Signed)
[facility]                            [name]   Annette Sharp, Annette Sharp                        MRN:          [id number]  DATE:10/06/2005                            DOB:          June 16, 1940    REFERRING PHYSICIAN:  Marcelino Duster   HISTORY OF PRESENT ILLNESS:  The patient is a 70 year old female with a  history of recent cardiac catheterization.  The patient was referred on September 03, 2005, by Dr. Jonelle Sidle for a cardiac catheterization, due to  increased exercise intolerance and shortness of breath as well as chest pain  on exertion.  The patient was found to have significant coronary artery  disease with single vessel coronary artery disease of the RCA, showing one  Cypher stent placement to the RCA.  She was also found to be in renal  insufficiency with a creatinine of 1.9.  This required hydration.  She was  taking Relafen, most likely contributing to her renal insufficiency.  In  addition, she had a report of guaiac-positive stools and a GI evaluation was  called for.  The patient was found to have multiple peptic ulcers,  complicated by GI bleeding; however, I do not have her CBC post-procedure.  The patient was treated with aspirin and Plavix after her procedure.   The patient now continues to complain of significant weakness and fatigue.  She had a dizzy spell last Sunday while getting up to the bathroom.  She  stated she hit her left breast, which is quite bruised on exam today.  She  also feels extremely weak after taking a bath.  Clinically she appears to be  anemic on exam today.   MEDICATIONS:  1.  Synthroid 100 mcg p.o. daily.  2.  Diltiazem XR 240 mg p.o. daily.  3.  Benicar/hydrochlorothiazide 40/25 mg p.o. daily.  4.  Citalopram 20 mg daily.  5.  Acyclovir 400 mg daily.  6.  Zyrtec 10 mg daily.  7.  Amitriptyline 50 mg p.o. daily.  8.  Plavix 75 mg p.o. daily.  9.  Protonix 40 mg p.o. daily.   PHYSICAL EXAMINATION:  VITAL SIGNS:  Blood pressure 120/60, heart rate 72,  weight 202 pounds.  GENERAL:  A well-nourished white female, pale in appearance.  HEENT:  Pale conjunctivae.  NECK:  Normal carotid upstrokes, no carotid bruits.  LUNGS:  Clear breath sounds bilaterally.  HEART:  Regular rate and rhythm.  Normal S1 and S2.  No murmurs, rubs or  gallops.  ABDOMEN:  Soft.  EXTREMITIES:  No clubbing, cyanosis or edema.  BREASTS:  Exam revealed a bruised left breast with hematoma.   PROBLEMS:  1.  Coronary artery disease, status post Cypher stent to the right coronary      artery.  2.  Chronic renal insufficiency, creatinine 1.9 to 1.5 after discontinuation      of Relafen.  3.  Recent gastrointestinal bleeding with multiple ulcers found on      endoscopy.  4.  Guaiac-positive stools.  5.  Dizziness and weakness.  6.  Rule out anemia.  PLAN:  1.  Suspect that the patient is markedly anemic.  She was on aspirin and      Plavix post-Cypher stent.  We will send her back to the laboratory to      receive some guaiac cards.  2.  The patient will also have a CBC and a BMET done.  3.  The patient will return to the clinic in one week.                                   [name], MD, [facility]   GED/MedQ  DD:  10/06/2005  DT:  10/06/2005  Job #:  [id number]   cc:   Marcelino Duster

## 2010-08-15 NOTE — Discharge Summary (Signed)
NAMEKARLY, PITTER                 ACCOUNT NO.:  [id number]   MEDICAL RECORD NO.:  [id number]          PATIENT TYPE:  INP   LOCATION:  6527                         FACILITY:  [facility]   PHYSICIAN:  Pricilla Riffle, M.D.    DATE OF BIRTH:  May 09, 1940   DATE OF ADMISSION:  09/16/2005  DATE OF DISCHARGE:  09/19/2005                                 DISCHARGE SUMMARY   PRIMARY CARDIOLOGIST:  Dr. Simona Huh.   PRINCIPAL DIAGNOSES:  1.  Single-vessel coronary artery disease.      1.  Status post Cypher stenting right coronary artery.  2.  Normal left ventricular function.      1.  By echocardiography.  3.  Multiple gastric ulcers.      1.  Associated heme-positive stool.      2.  Most likely secondary to nonsteroidal use.  4.  Chronic renal insufficiency.   SECONDARY DIAGNOSES:  1.  Hyperlipidemia.      1.  Statin/Lopid intolerance.  2.  Angiotensin-converting enzyme inhibitor intolerant.   PROCEDURES:  Coronary angiogram/Cypher stenting 80% right coronary artery by  Dr. Charlies Constable on September 18, 2005.   REASON FOR ADMISSION:  Ms. Konczal is a 70 year old female, with no known  history of significant coronary artery disease, who was admitted for  elective coronary angiography for further evaluation of recurrent chest pain  and exertional dyspnea.   HOSPITAL COURSE:  Prior to proceeding with scheduled cardiac  catheterization, however, the patient was referred for renal evaluation  given her chronic renal insufficiency and with an admission creatinine level  of 1.9.  She was treated with hydration, Mucomyst, and cessation of Relafen.  Creatinine stabilized to levels of 1.4-1.5, and she was cleared to proceed  with cardiac catheterization.   Additionally, the patient also was referred for GI evaluation for what was  felt to be atypical chest pain.  She was found to have multiple gastric  ulcers and some coffee-grounds material.  She was a heme positive, and it  was felt that the ulcers  were secondary to the use of Relafen.  The patient  was treated with proton pump inhibitor and Carafate, the latter discontinued  prior to discharge.   From a cardiac perspective, recommendation was to treat with Plavix and low-  dose aspirin.   DISCHARGE LABORATORIES:  WBC 9, hemoglobin 11.6, platelets 249. Potassium  4.2, BUN 16, creatinine 1.4.   OUTSTANDING LABORATORIES:  Normal cardiac enzymes (post procedure).  TSH  1.40.  Urinalysis negative.  Hemoccult positive (2/2).  BUN 30, creatinine  1.9 on admission.  Potassium remained normal.   ABDOMINAL ULTRASOUND:  No acute findings; fatty liver.   INSTRUCTIONS:  1.  The patient is referred to cardiac catheterization discharge sheet.  2.  Patient strongly advised to refrain from using nonsteroidals.   DISCHARGE MEDICATIONS:  1.  Plavix 75 mg q.d.  2  Coated aspirin 81 mg q.d.  1.  Protonix 40 mEq q.d.  2.  Diltiazem 240 mg q.d.  3.  Synthroid 0.100 mg q.d.  4.  Benicar HCT 40/25 mg q.d.  5.  Celexa 20 mg q.d.  6.  Zyrtec 10 mg q.d.  7.  Amitriptyline 50 mg q.d.  8.  Acyclovir 400 mg q.d.  9.  Nitrostat 0.4 mg as directed.   FOLLOWUP:  1.  Dr. Simona Huh in two weeks at [facility] in [place].      Arrangements to be made through our office.  2.  Dr. Marcelino Duster in two or three weeks.   DISPOSITION:  Stable.   DISCHARGE DURATION:  35 minutes.      Gene Serpe, P.A. [facility]    ______________________________  Pricilla Riffle, M.D.    GS/MEDQ  D:  09/19/2005  T:  09/19/2005  Job:  [id number]   cc:   Lina Sar, M.D. [facility]  [address]  [place]  [place] [address]   Llana Aliment. Deterding, M.D.  Fax: [phone]   [facility]  [address]  [address]  [place], [place] [address]   Marcelino Duster, Dr.

## 2010-08-15 NOTE — Assessment & Plan Note (Signed)
[facility]                           GASTROENTEROLOGY OFFICE NOTE   KANDIS, HENRY                        MRN:          [id number]  DATE:01/05/2006                            DOB:          [birth year]    ADDENDUM   She has had a cholecystectomy in 2005.       Iva Boop, MD,FACG      CEG/MedQ  DD:  01/05/2006  DT:  01/07/2006  Job #:  [id number]

## 2010-08-19 NOTE — Op Note (Signed)
NAMEELEXIA, FRIEDT                 ACCOUNT NO.:  [id number]  MEDICAL RECORD NO.:  [id number]           PATIENT TYPE:  I  LOCATION:  5032                         FACILITY:  [facility]  PHYSICIAN:  Elizet Kaplan A. Thurston Hole, M.D. DATE OF BIRTH:  [birth year]  DATE OF PROCEDURE:  07/28/2010 DATE OF DISCHARGE:                              OPERATIVE REPORT   PREOPERATIVE DIAGNOSIS:  Right knee degenerative joint disease.  POSTOPERATIVE DIAGNOSIS:  Right knee degenerative joint disease.  PROCEDURE: 1. Right total knee replacement using DePuy cemented total knee system     with number 2.5 cemented femur, #3 cemented tibia with 12.5-mm     polyethylene RP tibial spacer, and 32-mm polyethylene cemented     patella. 2. Zinacef-impregnated cement.  SURGEON:  Elana Alm. Thurston Hole, MD.  ASSISTANT:  Kirstin Shepperson, PA-C.  ANESTHESIA:  General.  OPERATIVE TIME:  One hour 30 minutes.  COMPLICATIONS:  None.  DESCRIPTION OF PROCEDURE:  Ms. Olivar was brought to the operating room on July 28, 2010, after femoral nerve block was placed in the holding area by Anesthesia.  She was placed on the operative table in supine position.  After being placed under general anesthesia, she had a Foley catheter placed under sterile conditions.  She received Ancef 2 grams IV preoperatively for prophylaxis.  Her right knee was examined under anesthesia.  Range of motion from -8 to 125 degrees, moderate varus deformity, knee stable ligamentous exam with normal patellar tracking. The right leg was prepped using sterile DuraPrep and draped using sterile technique.  Time-out procedure was called and the correct right knee and leg identified.  The right leg was exsanguinated and a thigh tourniquet elevated at 375 mm.  Initially, through a 15-cm longitudinal incision based over the patella, initial exposure was made.  The underlying subcutaneous tissues were incised along with skin incision. A median arthrotomy was performed  revealing an excessive amount of normal-appearing joint fluid.  The articular surfaces were inspected. She had grade 4 changes medially, laterally, and in the patellofemoral joint.  Osteophytes were removed from the femoral condyles and tibial plateau.  Medial and lateral meniscal remnants were removed as well as the anterior cruciate ligament.  Intramedullary drill was then drilled up the femoral canal for placement of the distal femoral cutting jig, which was placed in the appropriate manner of rotation and a distal 11- mm cut was made.  The distal femur was incised.  A #2.5 was found be the appropriate size.  A #2.5 cutting jig was placed in the appropriate manner of external rotation and then these cuts were made.  At this point, the proximal tibia was exposed.  Tibial spines were removed with an oscillating saw.  Intramedullary drill was drilled down the tibial canal for placement of proximal tibial cutting jig, which was placed in the appropriate manner of rotation and a proximal 4-mm cut was made based off the medial or lower side.  Spacer blocks were then placed in flexion and extension.  A 12.5-mm blocks gave excellent balancing, excellent stability, and excellent correction of her flexion and varus deformities.  At this point, the #  3 tibial baseplate trial was placed on the cut tibial surface with an excellent fit and the keel cut was made. The PCL box cutter was then placed on the distal femur and these cuts were made.  At this point with a #2.5 femoral trial with a #3 tibial baseplate trial and 12.5-mm polyethylene RP tibial spacer, knee was reduced, taken through range of motion from 0 to 125 degrees with excellent stability and excellent correction of her flexion and varus deformities with normal patellar tracking.  A resurfacing 9-mm cut was then made on the patella and three locking holes were placed for a 32-mm polyethylene patellar trial.  Again, patellofemoral tracking  was evaluated, found be normal.  At this point, it was felt that all the trial components were of excellent size and stability.  They were then removed.  The knee was then jet lavage irrigated with 3 liters of saline.  The proximal tibia was then exposed.  A #3 tibial baseplate with Zinacef-impregnated cement backing was hammered into position with an excellent fit with excess cement being removed from around the edges. A #2.5 femoral component with cement backing was hammered into position also with an excellent fit with excess cement being removed from around the edges.  The 12.5-mm polyethylene RP tibial spacer was placed on tibial baseplate, knee reduced, taken through range of motion from 0 to 125 degrees with excellent stability and excellent correction of her flexion and varus deformities.  The 32-mm polyethylene cement backed patella was then placed in its position and held there with a clamp. After the cement hardened, again patellofemoral tracking was evaluated and found be normal.  At this point, it was felt that all the components were of excellent size and stability.  The knee was further irrigated with saline.  Tourniquet was released.  Hemostasis obtained with cautery.  The arthrotomy was then closed with #1 Ethilon suture over two medium Hemovac drains.  Subcutaneous tissues closed with 0 and 2-0 Vicryl, subcuticular layer closed with 4-0 Monocryl.  Sterile dressings and a long-leg splint applied.  The patient awakened, extubated, and taken to recovery in stable condition.  Needle and sponge count was correct x2 at the end of the case.  Neurovascular status normal.  Pulses 2+ and symmetric.     Amylee Lodato A. Thurston Hole, M.D.     RAW/MEDQ  D:  07/28/2010  T:  07/29/2010  Job:  [id number]  Electronically Signed by Salvatore Marvel M.D. on 08/19/2010 05:23:17 PM

## 2010-10-16 ENCOUNTER — Other Ambulatory Visit: Payer: Self-pay | Admitting: *Deleted

## 2010-10-16 MED ORDER — ISOSORBIDE MONONITRATE ER 60 MG PO TB24: ORAL_TABLET | ORAL | Status: DC

## 2010-12-19 LAB — BASIC METABOLIC PANEL
BUN: 19
CO2: 25
Calcium: 8.8
Chloride: 105
Creatinine, Ser: 1.27 — ABNORMAL HIGH
GFR calc Af Amer: 51 — ABNORMAL LOW
GFR calc non Af Amer: 42 — ABNORMAL LOW
Glucose, Bld: 93
Potassium: 4.4
Sodium: 136

## 2010-12-19 LAB — CBC
HCT: 32 — ABNORMAL LOW
Hemoglobin: 10.9 — ABNORMAL LOW
MCHC: 34.2
MCV: 93.3
Platelets: 213
RBC: 3.43 — ABNORMAL LOW
RDW: 12.3
WBC: 6.8

## 2011-01-15 ENCOUNTER — Ambulatory Visit (INDEPENDENT_AMBULATORY_CARE_PROVIDER_SITE_OTHER): Payer: Medicare Other | Admitting: Physician Assistant

## 2011-01-15 ENCOUNTER — Encounter: Payer: Self-pay | Admitting: Cardiology

## 2011-01-15 VITALS — BP 127/74 | HR 63 | Ht 62.0 in | Wt 206.0 lb

## 2011-01-15 DIAGNOSIS — E785 Hyperlipidemia, unspecified: Secondary | ICD-10-CM

## 2011-01-15 DIAGNOSIS — I10 Essential (primary) hypertension: Secondary | ICD-10-CM

## 2011-01-15 DIAGNOSIS — I2510 Atherosclerotic heart disease of native coronary artery without angina pectoris: Secondary | ICD-10-CM

## 2011-01-15 MED ORDER — ISOSORBIDE MONONITRATE ER 120 MG PO TB24: 120.0000 mg | ORAL_TABLET | Freq: Every day | ORAL | Status: DC

## 2011-01-15 MED ORDER — NITROGLYCERIN 0.4 MG SL SUBL: 0.4000 mg | SUBLINGUAL_TABLET | SUBLINGUAL | Status: DC | PRN

## 2011-01-15 NOTE — Assessment & Plan Note (Signed)
Well-controlled on current medication regimen 

## 2011-01-15 NOTE — Assessment & Plan Note (Signed)
Will increase isosorbide mononitrate to a total of 120 mg every morning, for more aggressive suppression of breakthrough angina. 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 She has stable pattern, with no suggestion of acceleration, since her last OV. She had a normal Lexiscan Cardiolite, when last seen for preop clearance, in April 2012. We'll also renew prescription for prn NTG.

## 2011-01-15 NOTE — Assessment & Plan Note (Signed)
We'll request most recent FLP from Dr. Jeannette How office. Aggressive management recommended with target LDL 70 or less, if feasible. Patient currently on very low dose Crestor, with reported intolerance of Lipitor.

## 2011-01-15 NOTE — Patient Instructions (Signed)
   Increase Imdur to 120mg  daily  Nitroglycerin tabs as needed for severe chest pain. Your physician wants you to follow up in: 6 months.  You will receive a reminder letter in the mail one-two months in advance.  If you don't receive a letter, please call our office to schedule the follow up appointment

## 2011-01-15 NOTE — Progress Notes (Signed)
HPI:  Patient presents for scheduled six-month followup.  When last seen, she was referred for a pharmacologic nuclear imaging study, for preoperative clearance. This yielded normal perfusion; EF 69%. Patient proceeded with successful right TKR, in April, with no noted complications. She reports significant improvement in her RLE symptoms, since undergoing surgery.  From a cardiac standpoint, she reports no significant change from her baseline of stable exertional angina pectoris and DOE. She denies any symptoms at rest. She denies any suggestion of acceleration in her symptom pattern. She has never used NTG. Her symptoms are reproducible with moderate exertion, or while working in the kitchen. She denies any symptoms suggestive of CHF.  Allergies  Allergen Reactions  . Ace Inhibitors     REACTION: Cough  . Gemfibrozil     REACTION: Muscle Aches  . Moxifloxacin     REACTION: Rash  . Statins     REACTION: Muscle Cramps    Current Outpatient Prescriptions on File Prior to Visit  Medication Sig Dispense Refill  . acyclovir (ZOVIRAX) 400 MG tablet Take one by mouth daily        . aspirin 81 MG tablet Take 81 mg by mouth daily.        . cetirizine (ZYRTEC) 10 MG tablet Take 10 mg by mouth daily.        . citalopram (CELEXA) 20 MG tablet Take 20 mg by mouth daily.        . clopidogrel (PLAVIX) 75 MG tablet Take 75 mg by mouth daily.        . isosorbide mononitrate (IMDUR) 60 MG 24 hr tablet Take 1&1/2 by mouth daily   45 tablet  6  . levothyroxine (SYNTHROID, LEVOTHROID) 88 MCG tablet Take 88 mcg by mouth daily.        . nitroGLYCERIN (NITROLINGUAL) 0.4 MG/SPRAY spray Place 1 spray under the tongue every 5 (five) minutes as needed.        Marland Kitchen omeprazole (PRILOSEC) 20 MG capsule Take 20 mg by mouth daily.          Past Medical History  Diagnosis Date  . Overweight     OBESITY  . Hyperlipidemia   . Coronary artery disease     S/P CYPHER DRUG-ELUTING STENTS TO RIGHT CORONARY ARTERY JUNE  2007  . Coronary atherosclerosis of native coronary artery     NEGATIVE LEXISCAN FOR ISCHEMIA MARCH 23,2010 NORMAL LV FUNCTTION  . Other and unspecified angina pectoris     ANGINA  . Hypertension   . Back pain   . Peptic ulcer disease   . Thyroid disease     TREATED HYPOTHYROIDISM  . Sleep apnea     SEVERE OBSTRUCTIVE  . 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 Stroke   . Depression   . Chronic renal insufficiency     GFR 45 ML'S PER MINUTE    Past Surgical History  Procedure Date  . Carpal tunnel release   . Cholecystectomy   . Total abdominal hysterectomy w/ bilateral salpingoophorectomy     History   Social History  . Marital Status: Married    Spouse Name: N/A    Number of Children: N/A  . Years of Education: N/A   Occupational History  . Not on file.   Social History Main Topics  . Smoking status: Never Smoker   . Smokeless tobacco: Never Used  . Alcohol Use: No  . Drug Use: No  . Sexually Active:  Not on file   Other Topics Concern  . Not on file   Social History Narrative  . No narrative on file    No family history on file.  ROS: Negative for orthopnea, PND, lower extremity edema, palpitations, presyncope/syncope, claudication, reflux, hematuria, hematochezia, or melena. Remaining systems reviewed, and are negative.   PHYSICAL EXAM:  BP 127/74  Pulse 63  Ht 5\' 2"  (1.575 m)  Wt 206 lb (93.441 kg)  BMI 37.68 kg/m2  GENERAL: well-nourished, well-developed; NAD HEENT: NCAT, PERRLA, EOMI; sclera clear; no xanthelasma NECK: palpable bilateral carotid pulses, no bruits; no JVD; no TM LUNGS: CTA bilaterally CARDIAC: RRR (S1, S2); no significant murmurs; no rubs or gallops ABDOMEN: soft, non-tender; intact BS EXTREMETIES: intact distal pulses; no significant peripheral edema SKIN: warm/dry; no obvious rash/lesions MUSCULOSKELETAL: no joint deformity NEURO: no focal deficit; NL affect   EKG: see EKG  section    ASSESSMENT & PLAN:

## 2011-02-18 ENCOUNTER — Encounter: Payer: Self-pay | Admitting: Internal Medicine

## 2011-04-09 DIAGNOSIS — H04129 Dry eye syndrome of unspecified lacrimal gland: Secondary | ICD-10-CM | POA: Diagnosis not present

## 2011-05-07 DIAGNOSIS — I209 Angina pectoris, unspecified: Secondary | ICD-10-CM | POA: Diagnosis not present

## 2011-05-07 DIAGNOSIS — R072 Precordial pain: Secondary | ICD-10-CM | POA: Diagnosis not present

## 2011-05-18 DIAGNOSIS — R079 Chest pain, unspecified: Secondary | ICD-10-CM | POA: Diagnosis not present

## 2011-05-18 DIAGNOSIS — R072 Precordial pain: Secondary | ICD-10-CM

## 2011-06-25 DIAGNOSIS — E039 Hypothyroidism, unspecified: Secondary | ICD-10-CM | POA: Diagnosis not present

## 2011-06-25 DIAGNOSIS — M129 Arthropathy, unspecified: Secondary | ICD-10-CM | POA: Diagnosis not present

## 2011-06-25 DIAGNOSIS — Z79899 Other long term (current) drug therapy: Secondary | ICD-10-CM | POA: Diagnosis not present

## 2011-06-25 DIAGNOSIS — R5382 Chronic fatigue, unspecified: Secondary | ICD-10-CM | POA: Diagnosis not present

## 2011-07-02 DIAGNOSIS — E538 Deficiency of other specified B group vitamins: Secondary | ICD-10-CM | POA: Diagnosis not present

## 2011-08-06 DIAGNOSIS — E7800 Pure hypercholesterolemia, unspecified: Secondary | ICD-10-CM | POA: Diagnosis not present

## 2011-08-06 DIAGNOSIS — E538 Deficiency of other specified B group vitamins: Secondary | ICD-10-CM | POA: Diagnosis not present

## 2011-08-06 DIAGNOSIS — E039 Hypothyroidism, unspecified: Secondary | ICD-10-CM | POA: Diagnosis not present

## 2011-08-06 DIAGNOSIS — K219 Gastro-esophageal reflux disease without esophagitis: Secondary | ICD-10-CM | POA: Diagnosis not present

## 2011-08-06 DIAGNOSIS — I10 Essential (primary) hypertension: Secondary | ICD-10-CM | POA: Diagnosis not present

## 2011-08-13 DIAGNOSIS — E039 Hypothyroidism, unspecified: Secondary | ICD-10-CM | POA: Diagnosis not present

## 2011-08-13 DIAGNOSIS — E7800 Pure hypercholesterolemia, unspecified: Secondary | ICD-10-CM | POA: Diagnosis not present

## 2011-08-13 DIAGNOSIS — B009 Herpesviral infection, unspecified: Secondary | ICD-10-CM | POA: Diagnosis not present

## 2011-08-13 DIAGNOSIS — I209 Angina pectoris, unspecified: Secondary | ICD-10-CM | POA: Diagnosis not present

## 2011-08-13 DIAGNOSIS — I10 Essential (primary) hypertension: Secondary | ICD-10-CM | POA: Diagnosis not present

## 2011-09-07 DIAGNOSIS — E538 Deficiency of other specified B group vitamins: Secondary | ICD-10-CM | POA: Diagnosis not present

## 2011-09-11 DIAGNOSIS — M67919 Unspecified disorder of synovium and tendon, unspecified shoulder: Secondary | ICD-10-CM | POA: Diagnosis not present

## 2011-09-11 DIAGNOSIS — M719 Bursopathy, unspecified: Secondary | ICD-10-CM | POA: Diagnosis not present

## 2011-09-28 DIAGNOSIS — E538 Deficiency of other specified B group vitamins: Secondary | ICD-10-CM | POA: Diagnosis not present

## 2011-10-08 ENCOUNTER — Encounter: Payer: Self-pay | Admitting: Cardiovascular Disease

## 2011-10-08 ENCOUNTER — Ambulatory Visit (INDEPENDENT_AMBULATORY_CARE_PROVIDER_SITE_OTHER): Payer: Medicare Other | Admitting: Cardiovascular Disease

## 2011-10-08 VITALS — BP 150/70 | HR 71 | Ht 62.0 in | Wt 217.0 lb

## 2011-10-08 DIAGNOSIS — I10 Essential (primary) hypertension: Secondary | ICD-10-CM | POA: Diagnosis not present

## 2011-10-08 DIAGNOSIS — R0609 Other forms of dyspnea: Secondary | ICD-10-CM | POA: Diagnosis not present

## 2011-10-08 DIAGNOSIS — E785 Hyperlipidemia, unspecified: Secondary | ICD-10-CM | POA: Diagnosis not present

## 2011-10-08 DIAGNOSIS — R0989 Other specified symptoms and signs involving the circulatory and respiratory systems: Secondary | ICD-10-CM

## 2011-10-08 DIAGNOSIS — I209 Angina pectoris, unspecified: Secondary | ICD-10-CM

## 2011-10-08 DIAGNOSIS — R0600 Dyspnea, unspecified: Secondary | ICD-10-CM

## 2011-10-08 MED ORDER — METOPROLOL TARTRATE 25 MG PO TABS: 25.0000 mg | ORAL_TABLET | Freq: Two times a day (BID) | ORAL | Status: DC

## 2011-10-08 NOTE — Progress Notes (Signed)
HPI  This is a 71 year old female who is here today for a six-month followup visit. She has known history of coronary artery disease status post angioplasty and drug-eluting stent placement to the RCA in 2007. She is known to have chronic stable angina. Most recent nuclear stress test was in February of this year which showed no evidence of ischemia with an EF 69%. She underwent right knee replacement. She reports symptoms of severe fatigue as well as exertional dyspnea and chest discomfort even walking to the mailbox. The symptoms of chest pain or stable according to her and has not worsened but dyspnea and fatigue seems to be worse than before. She was started on vitamin B12 shots with no significant improvement in her symptoms. She has known history of depression and has been on treatment for a long time. She seems to be tearful today and very frustrated due to the way that she is feeling. She has been under stress lately and her blood pressure has been running high.  According to the patient, she had no improvement in the past with angioplasty in 2007 and also no improvement with her recent increase in the dose of Imdur to 120 mg once daily.   Allergies  Allergen Reactions  . Ace Inhibitors     REACTION: Cough  . Gemfibrozil     REACTION: Muscle Aches  . Moxifloxacin     REACTION: Rash  . Statins     REACTION: Muscle Cramps     Current Outpatient Prescriptions on File Prior to Visit  Medication Sig Dispense Refill  . acyclovir (ZOVIRAX) 400 MG tablet Take one by mouth daily        . aspirin 81 MG tablet Take 81 mg by mouth daily.        . cetirizine (ZYRTEC) 10 MG tablet Take 10 mg by mouth daily.        . citalopram (CELEXA) 20 MG tablet Take 20 mg by mouth daily.        . clopidogrel (PLAVIX) 75 MG tablet Take 75 mg by mouth daily.        . isosorbide mononitrate (IMDUR) 120 MG 24 hr tablet Take 1 tablet (120 mg total) by mouth daily.  30 tablet  6  . levothyroxine (SYNTHROID,  LEVOTHROID) 88 MCG tablet Take 88 mcg by mouth daily.        . nitroGLYCERIN (NITROSTAT) 0.4 MG SL tablet Place 1 tablet (0.4 mg total) under the tongue every 5 (five) minutes as needed for chest pain.  25 tablet  3  . omeprazole (PRILOSEC) 20 MG capsule Take 20 mg by mouth daily.           Past Medical History  Diagnosis Date  . Overweight     OBESITY  . Hyperlipidemia   . Other and unspecified angina pectoris     ANGINA  . Hypertension   . Back pain   . Peptic ulcer disease   . Thyroid disease     TREATED HYPOTHYROIDISM  . Sleep apnea     SEVERE OBSTRUCTIVE  . 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 Stroke   . Depression   . Chronic renal insufficiency     GFR 45 ML'S PER MINUTE  . Coronary artery disease     S/P CYPHER DRUG-ELUTING STENTS TO RIGHT CORONARY ARTERY JUNE 2007  . Coronary atherosclerosis of native coronary artery     NEGATIVE LEXISCAN FOR ISCHEMIA  MARCH 23,2010 NORMAL LV FUNCTTION     Past Surgical History  Procedure Date  . Carpal tunnel release   . Cholecystectomy   . Total abdominal hysterectomy w/ bilateral salpingoophorectomy      History reviewed. No pertinent family history.   History   Social History  . Marital Status: Married    Spouse Name: N/A    Number of Children: N/A  . Years of Education: N/A   Occupational History  . Not on file.   Social History Main Topics  . Smoking status: Never Smoker   . Smokeless tobacco: Never Used  . Alcohol Use: No  . Drug Use: No  . Sexually Active: Not on file   Other Topics Concern  . Not on file   Social History Narrative  . No narrative on file      PHYSICAL EXAM   BP 150/70  Pulse 71  Ht 5\' 2"  (1.575 m)  Wt 217 lb (98.431 kg)  BMI 39.69 kg/m2 Constitutional: She is oriented to person, place, and time. She appears well-developed and well-nourished. No distress.  HENT: No nasal discharge.  Head: Normocephalic and atraumatic.  Eyes:  Pupils are equal and round. Right eye exhibits no discharge. Left eye exhibits no discharge.  Neck: Normal range of motion. Neck supple. No JVD present. No thyromegaly present.  Cardiovascular: Normal rate, regular rhythm, normal heart sounds. Exam reveals no gallop and no friction rub. No murmur heard.  Pulmonary/Chest: Effort normal and breath sounds normal. No stridor. No respiratory distress. She has no wheezes. She has no rales. She exhibits no tenderness.  Abdominal: Soft. Bowel sounds are normal. She exhibits no distension. There is no tenderness. There is no rebound and no guarding.  Musculoskeletal: Normal range of motion. She exhibits no edema and no tenderness.  Neurological: She is alert and oriented to person, place, and time. Coordination normal.  Skin: Skin is warm and dry. No rash noted. She is not diaphoretic. No erythema. No pallor.  Psychiatric: She has a normal mood and affect. Her behavior is normal. Judgment and thought content normal.     ASSESSMENT AND PLAN

## 2011-10-08 NOTE — Patient Instructions (Addendum)
   Metoprolol Tart. 25mg  twice a day   Echo  Office will contact with results Follow up in  1 month

## 2011-10-08 NOTE — Assessment & Plan Note (Signed)
Her blood pressure is mildly elevated. Metoprolol will be added today.

## 2011-10-08 NOTE — Assessment & Plan Note (Signed)
The patient reports stable symptoms of exertional chest pain. However, she complains of worsening dyspnea as well as fatigue. Most recent nuclear stress test showed no evidence of ischemia with normal ejection fraction. I recommend adding metoprolol 25 mg twice daily. I will request an echocardiogram to evaluate for any possible valvular abnormalities or pulmonary hypertension. She has no cardiac murmurs by physical exam. She is to followup in one month to reevaluate her symptoms. If there is no significant improvement an alternative explanation for her symptoms, cardiac catheterization can be considered for a definitive diagnosis.

## 2011-10-08 NOTE — Assessment & Plan Note (Signed)
It appears that she is no longer on Crestor due to intolerance to statins.

## 2011-10-21 ENCOUNTER — Other Ambulatory Visit (INDEPENDENT_AMBULATORY_CARE_PROVIDER_SITE_OTHER): Payer: Medicare Other

## 2011-10-21 ENCOUNTER — Other Ambulatory Visit: Payer: Self-pay

## 2011-10-21 ENCOUNTER — Other Ambulatory Visit: Payer: Medicare Other

## 2011-10-21 DIAGNOSIS — I2510 Atherosclerotic heart disease of native coronary artery without angina pectoris: Secondary | ICD-10-CM

## 2011-10-21 DIAGNOSIS — R0600 Dyspnea, unspecified: Secondary | ICD-10-CM

## 2011-10-21 DIAGNOSIS — I209 Angina pectoris, unspecified: Secondary | ICD-10-CM

## 2011-10-22 ENCOUNTER — Encounter: Payer: Self-pay | Admitting: *Deleted

## 2011-10-22 ENCOUNTER — Encounter: Payer: Self-pay | Admitting: Cardiovascular Disease

## 2011-10-22 ENCOUNTER — Other Ambulatory Visit: Payer: Medicare Other

## 2011-10-22 ENCOUNTER — Ambulatory Visit (INDEPENDENT_AMBULATORY_CARE_PROVIDER_SITE_OTHER): Payer: Medicare Other | Admitting: Cardiovascular Disease

## 2011-10-22 ENCOUNTER — Telehealth: Payer: Self-pay

## 2011-10-22 VITALS — BP 120/70 | HR 68 | Ht 62.0 in | Wt 214.8 lb

## 2011-10-22 DIAGNOSIS — Z01810 Encounter for preprocedural cardiovascular examination: Secondary | ICD-10-CM

## 2011-10-22 DIAGNOSIS — I209 Angina pectoris, unspecified: Secondary | ICD-10-CM

## 2011-10-22 DIAGNOSIS — R079 Chest pain, unspecified: Secondary | ICD-10-CM

## 2011-10-22 DIAGNOSIS — I2510 Atherosclerotic heart disease of native coronary artery without angina pectoris: Secondary | ICD-10-CM

## 2011-10-22 DIAGNOSIS — I10 Essential (primary) hypertension: Secondary | ICD-10-CM

## 2011-10-22 NOTE — Telephone Encounter (Signed)
No precert required.  Medicare,BCBS

## 2011-10-22 NOTE — Patient Instructions (Addendum)

## 2011-10-22 NOTE — Progress Notes (Signed)
HPI  This is a 71 year old female who is here today for a followup visit. She has known history of coronary artery disease status post angioplasty and drug-eluting stent placement to the RCA in 2007. She is known to have chronic stable angina. Most recent nuclear stress test was in February of this year which showed no evidence of ischemia with an EF 69%. She underwent right knee replacement.  She was seen recently and reported increased symptoms of fatigue, exertional dyspnea and chest discomfort even walking to the mailbox. She was noted to be hypertensive. I added metoprolol 25 mg twice daily. Her chest pain improved but did not resolve. She continues to complain of fatigue and exertional dyspnea. She is not able to do much physical activities. She had an echocardiogram done which showed normal LV systolic function without significant pulmonary hypertension.  Allergies  Allergen Reactions  . Ace Inhibitors     REACTION: Cough  . Gemfibrozil     REACTION: Muscle Aches  . Moxifloxacin     REACTION: Rash  . Statins     REACTION: Muscle Cramps     Current Outpatient Prescriptions on File Prior to Visit  Medication Sig Dispense Refill  . acyclovir (ZOVIRAX) 400 MG tablet Take one by mouth daily        . aspirin 81 MG tablet Take 81 mg by mouth daily.        . cetirizine (ZYRTEC) 10 MG tablet Take 10 mg by mouth daily.        . citalopram (CELEXA) 20 MG tablet Take 20 mg by mouth daily.        . clopidogrel (PLAVIX) 75 MG tablet Take 75 mg by mouth daily.        . isosorbide mononitrate (IMDUR) 120 MG 24 hr tablet Take 1 tablet (120 mg total) by mouth daily.  30 tablet  6  . levothyroxine (SYNTHROID, LEVOTHROID) 88 MCG tablet Take 88 mcg by mouth daily.        . metoprolol tartrate (LOPRESSOR) 25 MG tablet Take 1 tablet (25 mg total) by mouth 2 (two) times daily.  60 tablet  2  . nitroGLYCERIN (NITROSTAT) 0.4 MG SL tablet Place 1 tablet (0.4 mg total) under the tongue every 5 (five)  minutes as needed for chest pain.  25 tablet  3  . omeprazole (PRILOSEC) 20 MG capsule Take 20 mg by mouth daily.           Past Medical History  Diagnosis Date  . Overweight     OBESITY  . Hyperlipidemia   . Other and unspecified angina pectoris     ANGINA  . Hypertension   . Back pain   . Peptic ulcer disease   . Thyroid disease     TREATED HYPOTHYROIDISM  . Sleep apnea     SEVERE OBSTRUCTIVE  . 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 Stroke   . Depression   . Chronic renal insufficiency     GFR 45 ML'S PER MINUTE  . Coronary artery disease     S/P CYPHER DRUG-ELUTING STENTS TO RIGHT CORONARY ARTERY JUNE 2007  . Coronary atherosclerosis of native coronary artery     NEGATIVE LEXISCAN FOR ISCHEMIA MARCH 23,2010 NORMAL LV FUNCTTION     Past Surgical History  Procedure Date  . Carpal tunnel release   . Cholecystectomy   . Total abdominal hysterectomy w/ bilateral salpingoophorectomy      No  family history on file.   History   Social History  . Marital Status: Married    Spouse Name: N/A    Number of Children: N/A  . Years of Education: N/A   Occupational History  . Not on file.   Social History Main Topics  . Smoking status: Never Smoker   . Smokeless tobacco: Never Used  . Alcohol Use: No  . Drug Use: No  . Sexually Active: Not on file   Other Topics Concern  . Not on file   Social History Narrative  . No narrative on file     PHYSICAL EXAM   BP 120/70  Pulse 68  Ht 5\' 2"  (1.575 m)  Wt 214 lb 12.8 oz (97.433 kg)  BMI 39.29 kg/m2  SpO2 98%  Constitutional: She is oriented to person, place, and time. She appears well-developed and well-nourished. No distress.  HENT: No nasal discharge.  Head: Normocephalic and atraumatic.  Eyes: Pupils are equal and round. Right eye exhibits no discharge. Left eye exhibits no discharge.  Neck: Normal range of motion. Neck supple. No JVD present. No thyromegaly  present.  Cardiovascular: Normal rate, regular rhythm, normal heart sounds. Exam reveals no gallop and no friction rub. No murmur heard.  Pulmonary/Chest: Effort normal and breath sounds normal. No stridor. No respiratory distress. She has no wheezes. She has no rales. She exhibits no tenderness.  Abdominal: Soft. Bowel sounds are normal. She exhibits no distension. There is no tenderness. There is no rebound and no guarding.  Musculoskeletal: Normal range of motion. She exhibits no edema and no tenderness.  Neurological: She is alert and oriented to person, place, and time. Coordination normal.  Skin: Skin is warm and dry. No rash noted. She is not diaphoretic. No erythema. No pallor.  Psychiatric: She has a normal mood and affect. Her behavior is normal. Judgment and thought content normal.      ASSESSMENT AND PLAN

## 2011-10-22 NOTE — Assessment & Plan Note (Signed)
Her blood pressure is well controlled now. Continue current medication.

## 2011-10-22 NOTE — Assessment & Plan Note (Signed)
The patient worsening symptoms of exertional chest pain, dyspnea and fatigue. This is in spite of maximal medical therapy . I discussed different management options with her including continued medical therapy versus proceeding with cardiac catheterization for definitive diagnosis. The patient is very frustrated with her overall condition and wants to know if this is cardiac or something else. Due to severity of her symptoms and known history of coronary artery disease, I recommend proceeding with cardiac catheterization and possible coronary intervention. She is to continue her current medications.

## 2011-10-22 NOTE — Telephone Encounter (Signed)
Checking percert for Heart Cath set for 10-28-2011 Dr. Kirke Corin

## 2011-10-28 ENCOUNTER — Inpatient Hospital Stay (HOSPITAL_BASED_OUTPATIENT_CLINIC_OR_DEPARTMENT_OTHER)
Admission: RE | Admit: 2011-10-28 | Discharge: 2011-10-28 | Disposition: A | Discharge status: Home / Self Care | Payer: Medicare Other | Source: Ambulatory Visit | Attending: Cardiovascular Disease | Admitting: Cardiovascular Disease

## 2011-10-28 ENCOUNTER — Encounter (HOSPITAL_BASED_OUTPATIENT_CLINIC_OR_DEPARTMENT_OTHER)
Admission: RE | Disposition: A | Discharge status: Home / Self Care | Payer: Self-pay | Source: Ambulatory Visit | Attending: Cardiovascular Disease

## 2011-10-28 DIAGNOSIS — Z9861 Coronary angioplasty status: Secondary | ICD-10-CM | POA: Insufficient documentation

## 2011-10-28 DIAGNOSIS — N189 Chronic kidney disease, unspecified: Secondary | ICD-10-CM | POA: Diagnosis not present

## 2011-10-28 DIAGNOSIS — I129 Hypertensive chronic kidney disease with stage 1 through stage 4 chronic kidney disease, or unspecified chronic kidney disease: Secondary | ICD-10-CM | POA: Insufficient documentation

## 2011-10-28 DIAGNOSIS — I2510 Atherosclerotic heart disease of native coronary artery without angina pectoris: Secondary | ICD-10-CM

## 2011-10-28 DIAGNOSIS — I209 Angina pectoris, unspecified: Secondary | ICD-10-CM | POA: Diagnosis not present

## 2011-10-28 SURGERY — JV LEFT HEART CATHETERIZATION WITH CORONARY ANGIOGRAM
Anesthesia: Moderate Sedation

## 2011-10-28 MED ORDER — ACETAMINOPHEN 325 MG PO TABS: 650.0000 mg | ORAL_TABLET | ORAL | Status: DC | PRN

## 2011-10-28 MED ORDER — SODIUM CHLORIDE 0.9 % IV SOLN: INTRAVENOUS | Status: DC

## 2011-10-28 NOTE — Progress Notes (Signed)
4 hours of iv fluids completed.  Discharged to home.

## 2011-10-28 NOTE — OR Nursing (Signed)
Tegaderm dressing applied, site level 0, bedrest begins 1120

## 2011-10-28 NOTE — Progress Notes (Signed)
Bedrest completed @ 1320.  Ambulated to bathroom without bleeding from right groin site.  Discharge instructions completed.  2 more hours of iv fluids for hydration.

## 2011-10-28 NOTE — Interval H&P Note (Signed)
History and Physical Interval Note:  10/28/2011 10:46 AM  Annette Sharp  has presented today for surgery, with the diagnosis of cad/cp  The various methods of treatment have been discussed with the patient and family. After consideration of risks, benefits and other options for treatment, the patient has consented to  Procedure(s) (LRB): JV LEFT HEART CATHETERIZATION WITH CORONARY ANGIOGRAM (N/A) as a surgical intervention .  The patient's history has been reviewed, patient examined, no change in status, stable for surgery.  I have reviewed the patient's chart and labs.  Questions were answered to the patient's satisfaction.     [name]

## 2011-10-28 NOTE — CV Procedure (Signed)
    Cardiac Catheterization Procedure Note  Name: Annette Sharp MRN: [id number] DOB: 11-Jan-1941  Procedure: Left Heart Cath, Selective Coronary Angiography.   Indication: Worsening angina with known history of coronary artery disease.   Medications:  Sedation:  1 mg IV Versed, 25 mcg IV Fentanyl  Contrast:  75 Omnipaque  Procedural details: The right groin was prepped, draped, and anesthetized with 1% lidocaine. Using modified Seldinger technique, a 4 French sheath was introduced into the right femoral artery. Standard Judkins catheters were used for coronary angiography and left ventriculography. Catheter exchanges were performed over a guidewire. There were no immediate procedural complications. The patient was transferred to the post catheterization recovery area for further monitoring.   Procedural Findings:  Hemodynamics: AO:  117/49   mmHg LV:  114/5    mmHg LVEDP: 18  mmHg  Coronary angiography: Coronary dominance: Right   Left Main:  Normal.  Left Anterior Descending (LAD):  Normal in size and mildly calcified. There is a 30% tubular stenosis in the midsegment at the second diagonal branch. The rest of the vessel has minor irregularities.  1st diagonal (D1):  Normal in size with mild 30% ostial stenosis.  2nd diagonal (D2):  Large in size without significant disease.  3rd diagonal (D3):  Very small in size.  Circumflex (LCx):  Normal in size and nondominant. There is a 30% ostial lesion. There is a 20% tubular stenosis in the midsegment.  1st obtuse marginal:  Normal in size without significant disease.  2nd obtuse marginal:  Normal in size without significant disease.  3rd obtuse marginal:  Normal in size without significant disease.   Right Coronary Artery: Normal in size and dominant. There is a stent noted at the ostium which is patent with mild 20% restenosis. Distal to the stent, there is a calcified plaque with 30% stenosis. In the midsegment, there is a 30%  tubular stenosis.  posterior descending artery: Medium in size and free of significant disease.  posterior lateral branch:  To normal size PL branches which are free of significant disease.  Left ventriculography: Was not performed due to chronic kidney disease. LV systolic function is known to be normal on echocardiogram.  Final Conclusions:  1. No evidence of obstructive coronary artery disease. Patent ostial RCA stents with mild restenosis. 2. Mildly elevated left ventricular end-diastolic pressure.  Recommendations:  Continue medical therapy. Evaluate for alternative etiology for chest pain and dyspnea. Will hydrate for 4 hours was procedure due to chronic kidney disease.  [name] MD, [facility] 10/28/2011, 11:20 AM

## 2011-10-28 NOTE — H&P (View-Only) (Signed)
  HPI  This is a 71-year-old female who is here today for a followup visit. She has known history of coronary artery disease status post angioplasty and drug-eluting stent placement to the RCA in 2007. She is known to have chronic stable angina. Most recent nuclear stress test was in February of this year which showed no evidence of ischemia with an EF 69%. She underwent right knee replacement.  She was seen recently and reported increased symptoms of fatigue, exertional dyspnea and chest discomfort even walking to the mailbox. She was noted to be hypertensive. I added metoprolol 25 mg twice daily. Her chest pain improved but did not resolve. She continues to complain of fatigue and exertional dyspnea. She is not able to do much physical activities. She had an echocardiogram done which showed normal LV systolic function without significant pulmonary hypertension.  Allergies  Allergen Reactions  . Ace Inhibitors     REACTION: Cough  . Gemfibrozil     REACTION: Muscle Aches  . Moxifloxacin     REACTION: Rash  . Statins     REACTION: Muscle Cramps     Current Outpatient Prescriptions on File Prior to Visit  Medication Sig Dispense Refill  . acyclovir (ZOVIRAX) 400 MG tablet Take one by mouth daily        . aspirin 81 MG tablet Take 81 mg by mouth daily.        . cetirizine (ZYRTEC) 10 MG tablet Take 10 mg by mouth daily.        . citalopram (CELEXA) 20 MG tablet Take 20 mg by mouth daily.        . clopidogrel (PLAVIX) 75 MG tablet Take 75 mg by mouth daily.        . isosorbide mononitrate (IMDUR) 120 MG 24 hr tablet Take 1 tablet (120 mg total) by mouth daily.  30 tablet  6  . levothyroxine (SYNTHROID, LEVOTHROID) 88 MCG tablet Take 88 mcg by mouth daily.        . metoprolol tartrate (LOPRESSOR) 25 MG tablet Take 1 tablet (25 mg total) by mouth 2 (two) times daily.  60 tablet  2  . nitroGLYCERIN (NITROSTAT) 0.4 MG SL tablet Place 1 tablet (0.4 mg total) under the tongue every 5 (five)  minutes as needed for chest pain.  25 tablet  3  . omeprazole (PRILOSEC) 20 MG capsule Take 20 mg by mouth daily.           Past Medical History  Diagnosis Date  . Overweight     OBESITY  . Hyperlipidemia   . Other and unspecified angina pectoris     ANGINA  . Hypertension   . Back pain   . Peptic ulcer disease   . Thyroid disease     TREATED HYPOTHYROIDISM  . Sleep apnea     SEVERE OBSTRUCTIVE  . 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 Stroke   . Depression   . Chronic renal insufficiency     GFR 45 ML'S PER MINUTE  . Coronary artery disease     S/P CYPHER DRUG-ELUTING STENTS TO RIGHT CORONARY ARTERY JUNE 2007  . Coronary atherosclerosis of native coronary artery     NEGATIVE LEXISCAN FOR ISCHEMIA MARCH 23,2010 NORMAL LV FUNCTTION     Past Surgical History  Procedure Date  . Carpal tunnel release   . Cholecystectomy   . Total abdominal hysterectomy w/ bilateral salpingoophorectomy      No   family history on file.   History   Social History  . Marital Status: Married    Spouse Name: N/A    Number of Children: N/A  . Years of Education: N/A   Occupational History  . Not on file.   Social History Main Topics  . Smoking status: Never Smoker   . Smokeless tobacco: Never Used  . Alcohol Use: No  . Drug Use: No  . Sexually Active: Not on file   Other Topics Concern  . Not on file   Social History Narrative  . No narrative on file     PHYSICAL EXAM   BP 120/70  Pulse 68  Ht 5' 2" (1.575 m)  Wt 214 lb 12.8 oz (97.433 kg)  BMI 39.29 kg/m2  SpO2 98%  Constitutional: She is oriented to person, place, and time. She appears well-developed and well-nourished. No distress.  HENT: No nasal discharge.  Head: Normocephalic and atraumatic.  Eyes: Pupils are equal and round. Right eye exhibits no discharge. Left eye exhibits no discharge.  Neck: Normal range of motion. Neck supple. No JVD present. No thyromegaly  present.  Cardiovascular: Normal rate, regular rhythm, normal heart sounds. Exam reveals no gallop and no friction rub. No murmur heard.  Pulmonary/Chest: Effort normal and breath sounds normal. No stridor. No respiratory distress. She has no wheezes. She has no rales. She exhibits no tenderness.  Abdominal: Soft. Bowel sounds are normal. She exhibits no distension. There is no tenderness. There is no rebound and no guarding.  Musculoskeletal: Normal range of motion. She exhibits no edema and no tenderness.  Neurological: She is alert and oriented to person, place, and time. Coordination normal.  Skin: Skin is warm and dry. No rash noted. She is not diaphoretic. No erythema. No pallor.  Psychiatric: She has a normal mood and affect. Her behavior is normal. Judgment and thought content normal.      ASSESSMENT AND PLAN   

## 2011-10-28 NOTE — OR Nursing (Signed)
Dr Arida at bedside to discuss results and treatment plan with pt and family 

## 2011-10-30 DIAGNOSIS — E538 Deficiency of other specified B group vitamins: Secondary | ICD-10-CM | POA: Diagnosis not present

## 2011-11-12 ENCOUNTER — Ambulatory Visit (INDEPENDENT_AMBULATORY_CARE_PROVIDER_SITE_OTHER): Payer: Medicare Other | Admitting: Physician Assistant

## 2011-11-12 ENCOUNTER — Encounter: Payer: Self-pay | Admitting: Physician Assistant

## 2011-11-12 VITALS — BP 160/79 | HR 55 | Ht 62.0 in | Wt 216.0 lb

## 2011-11-12 DIAGNOSIS — E785 Hyperlipidemia, unspecified: Secondary | ICD-10-CM | POA: Diagnosis not present

## 2011-11-12 DIAGNOSIS — I2510 Atherosclerotic heart disease of native coronary artery without angina pectoris: Secondary | ICD-10-CM | POA: Diagnosis not present

## 2011-11-12 DIAGNOSIS — N189 Chronic kidney disease, unspecified: Secondary | ICD-10-CM | POA: Diagnosis not present

## 2011-11-12 DIAGNOSIS — I10 Essential (primary) hypertension: Secondary | ICD-10-CM

## 2011-11-12 MED ORDER — AMLODIPINE BESYLATE 5 MG PO TABS: 5.0000 mg | ORAL_TABLET | Freq: Every day | ORAL | Status: DC

## 2011-11-12 NOTE — Assessment & Plan Note (Signed)
Currently uncontrolled. Will reassess following substitution of Lopressor with amlodipine, as outlined above.

## 2011-11-12 NOTE — Assessment & Plan Note (Signed)
Followed by primary M.D. Will check post cath BMET (LVG deferred). Most recent creatinine level I.4 (GFR 38).

## 2011-11-12 NOTE — Assessment & Plan Note (Signed)
Statin intolerant. Continue emphasis of importance of low saturated fat diet.

## 2011-11-12 NOTE — Progress Notes (Signed)
Primary Cardiologist: Lewayne Bunting, MD   HPI: Status post recent elective cardiac catheterization, per Dr. Kirke Corin, for further evaluation of exertional CP, dyspnea, and fatigue, despite maximal therapy, with known history of CAD.  Final Conclusions:   1. No evidence of obstructive coronary artery disease. Patent ostial RCA stents with mild restenosis.   2. Mildly elevated left ventricular end-diastolic pressure  Left ventriculography: Was not performed due to chronic kidney disease. LV systolic function is known to be normal on echocardiogram  Unfortunately, she continues to have the same symptoms of exertional CP and DOE. In fact, she states that these have essentially remained the same, since undergoing PCI in 2007. She denies any symptoms at rest, or any symptoms suggestive of CHF.  Patient denies any complications of the right groin incision site.   Allergies  Allergen Reactions  . Ace Inhibitors     REACTION: Cough  . Gemfibrozil     REACTION: Muscle Aches  . Moxifloxacin     REACTION: Rash  . Statins     REACTION: Muscle Cramps    Current Outpatient Prescriptions  Medication Sig Dispense Refill  . acyclovir (ZOVIRAX) 400 MG tablet Take one by mouth daily        . aspirin 81 MG tablet Take 81 mg by mouth daily.        . cetirizine (ZYRTEC) 10 MG tablet Take 10 mg by mouth daily.        . citalopram (CELEXA) 20 MG tablet Take 20 mg by mouth daily.        . clopidogrel (PLAVIX) 75 MG tablet Take 75 mg by mouth daily.        . isosorbide mononitrate (IMDUR) 120 MG 24 hr tablet Take 1 tablet (120 mg total) by mouth daily.  30 tablet  6  . levothyroxine (SYNTHROID, LEVOTHROID) 88 MCG tablet Take 88 mcg by mouth daily.        . nitroGLYCERIN (NITROSTAT) 0.4 MG SL tablet Place 1 tablet (0.4 mg total) under the tongue every 5 (five) minutes as needed for chest pain.  25 tablet  3  . omeprazole (PRILOSEC) 20 MG capsule Take 20 mg by mouth daily.        Marland Kitchen amLODipine (NORVASC) 5 MG  tablet Take 1 tablet (5 mg total) by mouth daily.  30 tablet  6    Past Medical History  Diagnosis Date  . Overweight     OBESITY  . Hyperlipidemia   . Other and unspecified angina pectoris     ANGINA  . Hypertension   . Back pain   . Peptic ulcer disease   . Thyroid disease     TREATED HYPOTHYROIDISM  . Sleep apnea     SEVERE OBSTRUCTIVE  . 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 Stroke   . Depression   . Chronic renal insufficiency     GFR 45 ML'S PER MINUTE  . Coronary artery disease     S/P CYPHER DRUG-ELUTING STENTS TO RIGHT CORONARY ARTERY JUNE 2007  . Coronary atherosclerosis of native coronary artery     NEGATIVE LEXISCAN FOR ISCHEMIA MARCH 23,2010 NORMAL LV FUNCTTION    Past Surgical History  Procedure Date  . Carpal tunnel release   . Cholecystectomy   . Total abdominal hysterectomy w/ bilateral salpingoophorectomy     History   Social History  . Marital Status: Married    Spouse Name: N/A    Number of Children:  N/A  . Years of Education: N/A   Occupational History  . Not on file.   Social History Main Topics  . Smoking status: Never Smoker   . Smokeless tobacco: Never Used  . Alcohol Use: No  . Drug Use: No  . Sexually Active: Not on file   Other Topics Concern  . Not on file   Social History Narrative  . No narrative on file    No family history on file.  ROS: no nausea, vomiting; no fever, chills; no melena, hematochezia; no claudication  PHYSICAL EXAM: BP 160/79  Pulse 55  Ht 5\' 2"  (1.575 m)  Wt 216 lb (97.977 kg)  BMI 39.51 kg/m2 GENERAL: 71 year old female, moderately obese; NAD HEENT: NCAT, PERRLA, EOMI; sclera clear; no xanthelasma NECK: palpable bilateral carotid pulses, no bruits; no JVD; no TM LUNGS: CTA bilaterally CARDIAC: RRR (S1, S2); no significant murmurs; no rubs or gallops ABDOMEN: Protuberant EXTREMETIES: Palpable right femoral pulse, no hematoma/bruit; palpable  bilateral peripheral pulses; no significant peripheral edema SKIN: warm/dry; no obvious rash/lesions MUSCULOSKELETAL: no joint deformity NEURO: no focal deficit; NL affect   EKG: reviewed and available in Electronic Records   ASSESSMENT & PLAN:  ANGINA, STABLE/EXERTIONAL Following review with Dr. Andee Lineman, recommendation is as follows: We'll discontinue Lopressor and start amlodipine 5 mg daily, for treatment of presumptive coronary vasospasm. We'll otherwise continue remaining home medication regimen, and reassess clinical status in one month.  HYPERTENSION, UNSPECIFIED Currently uncontrolled. Will reassess following substitution of Lopressor with amlodipine, as outlined above.  HYPERLIPIDEMIA-MIXED Statin intolerant. Continue emphasis of importance of low saturated fat diet.  Chronic kidney disease Followed by primary M.D. Will check post cath BMET (LVG deferred). Most recent creatinine level I.4 (GFR 38).    Gene Samreen Seltzer, PAC

## 2011-11-12 NOTE — Assessment & Plan Note (Signed)
Following review with Dr. Andee Lineman, recommendation is as follows: We'll discontinue Lopressor and start amlodipine 5 mg daily, for treatment of presumptive coronary vasospasm. We'll otherwise continue remaining home medication regimen, and reassess clinical status in one month.

## 2011-11-12 NOTE — Patient Instructions (Addendum)
Stop taking Lopressor (Metoprolol).  Start Norvasc 5mg  daily.  LABS:  BMET today at [facility].  Your physician recommends that you schedule a follow-up appointment in: 1 month.

## 2011-11-13 ENCOUNTER — Telehealth: Payer: Self-pay | Admitting: *Deleted

## 2011-11-13 NOTE — Telephone Encounter (Signed)
Message copied by Eustace Moore on Fri Nov 13, 2011 11:41 AM ------      Message from: Rande Brunt      Created: Fri Nov 13, 2011 11:16 AM       Creatinine 1.2. Continue current medication regimen.

## 2011-11-13 NOTE — Telephone Encounter (Signed)
Patient informed. 

## 2011-12-01 DIAGNOSIS — E538 Deficiency of other specified B group vitamins: Secondary | ICD-10-CM | POA: Diagnosis not present

## 2011-12-12 DIAGNOSIS — H2510 Age-related nuclear cataract, unspecified eye: Secondary | ICD-10-CM | POA: Diagnosis not present

## 2011-12-16 ENCOUNTER — Ambulatory Visit (INDEPENDENT_AMBULATORY_CARE_PROVIDER_SITE_OTHER): Payer: Medicare Other | Admitting: Physician Assistant

## 2011-12-16 ENCOUNTER — Encounter: Payer: Self-pay | Admitting: Physician Assistant

## 2011-12-16 VITALS — BP 173/87 | HR 70 | Ht 62.0 in | Wt 214.1 lb

## 2011-12-16 DIAGNOSIS — I209 Angina pectoris, unspecified: Secondary | ICD-10-CM | POA: Diagnosis not present

## 2011-12-16 DIAGNOSIS — I10 Essential (primary) hypertension: Secondary | ICD-10-CM | POA: Diagnosis not present

## 2011-12-16 DIAGNOSIS — I2510 Atherosclerotic heart disease of native coronary artery without angina pectoris: Secondary | ICD-10-CM | POA: Diagnosis not present

## 2011-12-16 DIAGNOSIS — N189 Chronic kidney disease, unspecified: Secondary | ICD-10-CM

## 2011-12-16 MED ORDER — AMLODIPINE BESYLATE 10 MG PO TABS: 10.0000 mg | ORAL_TABLET | Freq: Every day | ORAL | Status: DC

## 2011-12-16 NOTE — Assessment & Plan Note (Signed)
Remains uncontrolled. Will reassess following today's up titration of amlodipine. Patient also instructed to periodically check her ambulatory BP at home, at local pharmacies, and at scheduled followup with her primary M.D.

## 2011-12-16 NOTE — Assessment & Plan Note (Signed)
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 Additionally, this will help modulate her uncontrolled HTN. We'll reassess clinical status in one month with me, at which time she will establish with Dr. [name].

## 2011-12-16 NOTE — Progress Notes (Signed)
Primary Cardiologist: Annette Bonito, MD (new)   HPI: Patient presents for early scheduled followup.  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 She has increased exercise tolerance, with no associated CP and listed in. She does, however, have some of the previous symptoms, but only with significant exertion.  Allergies  Allergen Reactions  . Ace Inhibitors     REACTION: Cough  . Gemfibrozil     REACTION: Muscle Aches  . Moxifloxacin     REACTION: Rash  . Statins     REACTION: Muscle Cramps    Current Outpatient Prescriptions  Medication Sig Dispense Refill  . acyclovir (ZOVIRAX) 400 MG tablet Take one by mouth daily        . amLODipine (NORVASC) 10 MG tablet Take 1 tablet (10 mg total) by mouth daily.  30 tablet  6  . aspirin 81 MG tablet Take 81 mg by mouth daily.        . cetirizine (ZYRTEC) 10 MG tablet Take 10 mg by mouth daily.        . citalopram (CELEXA) 20 MG tablet Take 20 mg by mouth daily.        . clopidogrel (PLAVIX) 75 MG tablet Take 75 mg by mouth daily.        . isosorbide mononitrate (IMDUR) 120 MG 24 hr tablet Take 1 tablet (120 mg total) by mouth daily.  30 tablet  6  . levothyroxine (SYNTHROID, LEVOTHROID) 88 MCG tablet Take 88 mcg by mouth daily.        Marland Kitchen omeprazole (PRILOSEC) 20 MG capsule Take 20 mg by mouth daily.        Marland Kitchen DISCONTD: amLODipine (NORVASC) 5 MG tablet Take 1 tablet (5 mg total) by mouth daily.  30 tablet  6  . cyclobenzaprine (FLEXERIL) 5 MG tablet Take 5 mg by mouth as needed.      . nitroGLYCERIN (NITROSTAT) 0.4 MG SL tablet Place 1 tablet (0.4 mg total) under the tongue every 5 (five) minutes as needed for chest pain.  25 tablet  3    Past Medical History  Diagnosis Date  . Overweight     OBESITY  . Hyperlipidemia   . Other and unspecified angina pectoris     ANGINA  . Hypertension   . Back pain   . Peptic ulcer disease   .  Thyroid disease     TREATED HYPOTHYROIDISM  . Sleep apnea     SEVERE OBSTRUCTIVE  . 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 Stroke   . Depression   . Chronic renal insufficiency     GFR 45 ML'S PER MINUTE  . Coronary artery disease     S/P CYPHER DRUG-ELUTING STENTS TO RIGHT CORONARY ARTERY JUNE 2007  . Coronary atherosclerosis of native coronary artery     NEGATIVE LEXISCAN FOR ISCHEMIA MARCH 23,2010 NORMAL LV FUNCTTION    Past Surgical History  Procedure Date  . Carpal tunnel release   . Cholecystectomy   . Total abdominal hysterectomy w/ bilateral salpingoophorectomy     History   Social History  . Marital Status: Married    Spouse Name: N/A    Number of Children: N/A  . Years of Education: N/A   Occupational History  . Not on file.   Social History Main Topics  . Smoking status: Never Smoker   . Smokeless tobacco: Never Used  .  Alcohol Use: No  . Drug Use: No  . Sexually Active: Not on file   Other Topics Concern  . Not on file   Social History Narrative  . No narrative on file    No family history on file.  ROS: no nausea, vomiting; no fever, chills; no melena, hematochezia; no claudication  PHYSICAL EXAM: BP 173/87  Pulse 70  Ht 5\' 2"  (1.575 m)  Wt 214 lb 1.9 oz (97.124 kg)  BMI 39.16 kg/m2  SpO2 97% GENERAL: 71 year old female, moderately obese; NAD  HEENT: NCAT, PERRLA, EOMI; sclera clear; no xanthelasma  NECK: palpable bilateral carotid pulses, no bruits; no JVD; no TM  LUNGS: CTA bilaterally  CARDIAC: RRR (S1, S2); no significant murmurs; no rubs or gallops  ABDOMEN: Protuberant  EXTREMETIES: no significant peripheral edema  SKIN: warm/dry; no obvious rash/lesions  MUSCULOSKELETAL: no joint deformity  NEURO: no focal deficit; NL affect    EKG: reviewed and available in Electronic Records   ASSESSMENT & PLAN:  ANGINA, STABLE/EXERTIONAL Will increase amlodipine to 10 mg daily, for ongoing  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 Additionally, this will help modulate her uncontrolled HTN. We'll reassess clinical status in one month with me, at which time she will establish with Dr. [name].  HYPERTENSION, UNSPECIFIED Remains uncontrolled. Will reassess following today's up titration of amlodipine. Patient also instructed to periodically check her ambulatory BP at home, at local pharmacies, and at scheduled followup with her primary M.D.  Chronic kidney disease Stable with creatinine 1.2, by followup BMET at time of last OV.    Annette Sharp, PAC

## 2011-12-16 NOTE — Assessment & Plan Note (Signed)
Stable with creatinine 1.2, by followup BMET at time of last OV.

## 2011-12-16 NOTE — Patient Instructions (Signed)
   Increase Norvasc to 10mg  daily Continue all other current medications. Follow up in  1 month

## 2011-12-23 ENCOUNTER — Encounter: Payer: Self-pay | Admitting: Internal Medicine

## 2011-12-31 DIAGNOSIS — E538 Deficiency of other specified B group vitamins: Secondary | ICD-10-CM | POA: Diagnosis not present

## 2012-01-02 DIAGNOSIS — Z23 Encounter for immunization: Secondary | ICD-10-CM | POA: Diagnosis not present

## 2012-01-20 ENCOUNTER — Ambulatory Visit (INDEPENDENT_AMBULATORY_CARE_PROVIDER_SITE_OTHER): Payer: Medicare Other | Admitting: Physician Assistant

## 2012-01-20 ENCOUNTER — Encounter: Payer: Self-pay | Admitting: Physician Assistant

## 2012-01-20 VITALS — BP 130/70 | HR 72 | Ht 62.0 in | Wt 215.4 lb

## 2012-01-20 DIAGNOSIS — R079 Chest pain, unspecified: Secondary | ICD-10-CM

## 2012-01-20 MED ORDER — ISOSORBIDE MONONITRATE ER 120 MG PO TB24: ORAL_TABLET | ORAL | Status: DC

## 2012-01-20 MED ORDER — NITROGLYCERIN 0.4 MG SL SUBL: 0.4000 mg | SUBLINGUAL_TABLET | SUBLINGUAL | Status: DC | PRN

## 2012-01-20 NOTE — Assessment & Plan Note (Signed)
Much improved since last OV. We'll continue to monitor closely, following increase in Imdur.

## 2012-01-20 NOTE — Patient Instructions (Signed)
   Increase Imdur to 180mg  daily   Nitroglycerin as needed for severe chest pain only Continue all other current medications.  Follow up in  2 months

## 2012-01-20 NOTE — Progress Notes (Signed)
Primary Cardiologist: Jerral Bonito, MD   HPI: Patient presents for early scheduled followup.  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 She now reports a significant improvement in frequency of episodes. She has not had to take any prn NTG. Since we first added amlodipine to her medication regimen, she reports significant overall improvement. She continues to deny exertional CP, and suggest that these attacks are quite sudden, such as when she's in the kitchen doing dishes, or when bending over.  Allergies  Allergen Reactions  . Ace Inhibitors     REACTION: Cough  . Gemfibrozil     REACTION: Muscle Aches  . Moxifloxacin     REACTION: Rash  . Statins     REACTION: Muscle Cramps    Current Outpatient Prescriptions  Medication Sig Dispense Refill  . acyclovir (ZOVIRAX) 400 MG tablet Take one by mouth daily        . amLODipine (NORVASC) 10 MG tablet Take 1 tablet (10 mg total) by mouth daily.  30 tablet  6  . aspirin 81 MG tablet Take 81 mg by mouth daily.        . cetirizine (ZYRTEC) 10 MG tablet Take 10 mg by mouth daily.        . citalopram (CELEXA) 20 MG tablet Take 20 mg by mouth daily.        . clopidogrel (PLAVIX) 75 MG tablet Take 75 mg by mouth daily.        . cyclobenzaprine (FLEXERIL) 5 MG tablet Take 5 mg by mouth as needed.      . isosorbide mononitrate (IMDUR) 120 MG 24 hr tablet Take 1 tablet (120 mg total) by mouth daily.  30 tablet  6  . levothyroxine (SYNTHROID, LEVOTHROID) 88 MCG tablet Take 88 mcg by mouth daily.        Marland Kitchen omeprazole (PRILOSEC) 20 MG capsule Take 20 mg by mouth daily.        . nitroGLYCERIN (NITROSTAT) 0.4 MG SL tablet Place 1 tablet (0.4 mg total) under the tongue every 5 (five) minutes as needed for chest pain.  25 tablet  3    Past Medical History  Diagnosis Date  . Overweight     OBESITY  . Hyperlipidemia   . Other and unspecified angina pectoris     ANGINA  .  Hypertension   . Back pain   . Peptic ulcer disease   . Thyroid disease     TREATED HYPOTHYROIDISM  . Sleep apnea     SEVERE OBSTRUCTIVE  . 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 Stroke   . Depression   . Chronic renal insufficiency     GFR 45 ML'S PER MINUTE  . Coronary artery disease     S/P CYPHER DRUG-ELUTING STENTS TO RIGHT CORONARY ARTERY JUNE 2007  . Coronary atherosclerosis of native coronary artery     NEGATIVE LEXISCAN FOR ISCHEMIA MARCH 23,2010 NORMAL LV FUNCTTION    Past Surgical History  Procedure Date  . Carpal tunnel release   . Cholecystectomy   . Total abdominal hysterectomy w/ bilateral salpingoophorectomy     History   Social History  . Marital Status: Married    Spouse Name: N/A    Number of Children: N/A  . Years of Education: N/A   Occupational History  . Not on file.   Social History Main Topics  . Smoking status: Never  Smoker   . Smokeless tobacco: Never Used  . Alcohol Use: No  . Drug Use: No  . Sexually Active: Not on file   Other Topics Concern  . Not on file   Social History Narrative  . No narrative on file    No family history on file.  ROS: no nausea, vomiting; no fever, chills; no melena, hematochezia; no claudication  PHYSICAL EXAM: BP 130/70  Pulse 72  Ht 5\' 2"  (1.575 m)  Wt 215 lb 6.4 oz (97.705 kg)  BMI 39.40 kg/m2  SpO2 98% GENERAL: 71 year old female, moderately obese; NAD  HEENT: NCAT, PERRLA, EOMI; sclera clear; no xanthelasma  NECK: palpable bilateral carotid pulses, no bruits; no JVD; no TM  LUNGS: CTA bilaterally  CARDIAC: RRR (S1, S2); no significant murmurs; no rubs or gallops  ABDOMEN: Protuberant  EXTREMETIES: no significant peripheral edema  SKIN: warm/dry; no obvious rash/lesions  MUSCULOSKELETAL: no joint deformity  NEURO: no focal deficit; NL affect   EKG:    ASSESSMENT & PLAN:  ANGINA, STABLE/EXERTIONAL Will increase Imdur to 180 mg daily.  We'll also renew prescription for NTG tablets. We'll reassess clinical status in 2 months, with Dr. [name].  HYPERTENSION, UNSPECIFIED Much improved since last OV. We'll continue to monitor closely, following increase in Imdur.    Gene Nastacia Raybuck, PAC

## 2012-01-20 NOTE — Assessment & Plan Note (Signed)
Will increase Imdur to 180 mg daily. We'll also renew prescription for NTG tablets. We'll reassess clinical status in 2 months, with Dr. [name].

## 2012-02-01 DIAGNOSIS — E538 Deficiency of other specified B group vitamins: Secondary | ICD-10-CM | POA: Diagnosis not present

## 2012-02-09 DIAGNOSIS — E7800 Pure hypercholesterolemia, unspecified: Secondary | ICD-10-CM | POA: Diagnosis not present

## 2012-02-09 DIAGNOSIS — I10 Essential (primary) hypertension: Secondary | ICD-10-CM | POA: Diagnosis not present

## 2012-02-09 DIAGNOSIS — E538 Deficiency of other specified B group vitamins: Secondary | ICD-10-CM | POA: Diagnosis not present

## 2012-02-09 DIAGNOSIS — E039 Hypothyroidism, unspecified: Secondary | ICD-10-CM | POA: Diagnosis not present

## 2012-02-15 DIAGNOSIS — I10 Essential (primary) hypertension: Secondary | ICD-10-CM | POA: Diagnosis not present

## 2012-02-15 DIAGNOSIS — E7800 Pure hypercholesterolemia, unspecified: Secondary | ICD-10-CM | POA: Diagnosis not present

## 2012-02-15 DIAGNOSIS — I209 Angina pectoris, unspecified: Secondary | ICD-10-CM | POA: Diagnosis not present

## 2012-02-15 DIAGNOSIS — E039 Hypothyroidism, unspecified: Secondary | ICD-10-CM | POA: Diagnosis not present

## 2012-03-28 ENCOUNTER — Ambulatory Visit: Payer: Medicare Other | Admitting: Cardiology

## 2012-04-28 DIAGNOSIS — M79609 Pain in unspecified limb: Secondary | ICD-10-CM | POA: Diagnosis not present

## 2012-06-02 DIAGNOSIS — I209 Angina pectoris, unspecified: Secondary | ICD-10-CM | POA: Diagnosis not present

## 2012-06-02 DIAGNOSIS — I2510 Atherosclerotic heart disease of native coronary artery without angina pectoris: Secondary | ICD-10-CM | POA: Diagnosis not present

## 2012-06-02 DIAGNOSIS — N189 Chronic kidney disease, unspecified: Secondary | ICD-10-CM | POA: Diagnosis not present

## 2012-06-22 ENCOUNTER — Encounter: Payer: Self-pay | Admitting: Cardiovascular Disease

## 2012-06-22 DIAGNOSIS — I209 Angina pectoris, unspecified: Secondary | ICD-10-CM | POA: Diagnosis not present

## 2012-06-27 DIAGNOSIS — N189 Chronic kidney disease, unspecified: Secondary | ICD-10-CM | POA: Diagnosis not present

## 2012-06-27 DIAGNOSIS — I129 Hypertensive chronic kidney disease with stage 1 through stage 4 chronic kidney disease, or unspecified chronic kidney disease: Secondary | ICD-10-CM | POA: Diagnosis not present

## 2012-06-27 DIAGNOSIS — I209 Angina pectoris, unspecified: Secondary | ICD-10-CM | POA: Diagnosis not present

## 2012-06-27 DIAGNOSIS — R079 Chest pain, unspecified: Secondary | ICD-10-CM | POA: Diagnosis not present

## 2012-06-27 DIAGNOSIS — I2510 Atherosclerotic heart disease of native coronary artery without angina pectoris: Secondary | ICD-10-CM | POA: Diagnosis not present

## 2012-06-27 DIAGNOSIS — Z9861 Coronary angioplasty status: Secondary | ICD-10-CM | POA: Diagnosis not present

## 2012-06-27 DIAGNOSIS — E663 Overweight: Secondary | ICD-10-CM | POA: Diagnosis not present

## 2012-06-27 DIAGNOSIS — I200 Unstable angina: Secondary | ICD-10-CM | POA: Diagnosis not present

## 2012-06-27 DIAGNOSIS — E785 Hyperlipidemia, unspecified: Secondary | ICD-10-CM | POA: Diagnosis not present

## 2012-07-04 DIAGNOSIS — E538 Deficiency of other specified B group vitamins: Secondary | ICD-10-CM | POA: Diagnosis not present

## 2012-08-02 ENCOUNTER — Other Ambulatory Visit: Payer: Self-pay | Admitting: Physician Assistant

## 2012-08-04 DIAGNOSIS — E039 Hypothyroidism, unspecified: Secondary | ICD-10-CM | POA: Diagnosis not present

## 2012-08-04 DIAGNOSIS — E7800 Pure hypercholesterolemia, unspecified: Secondary | ICD-10-CM | POA: Diagnosis not present

## 2012-08-04 DIAGNOSIS — I10 Essential (primary) hypertension: Secondary | ICD-10-CM | POA: Diagnosis not present

## 2012-08-04 DIAGNOSIS — I209 Angina pectoris, unspecified: Secondary | ICD-10-CM | POA: Diagnosis not present

## 2012-08-04 DIAGNOSIS — E538 Deficiency of other specified B group vitamins: Secondary | ICD-10-CM | POA: Diagnosis not present

## 2012-08-11 DIAGNOSIS — I10 Essential (primary) hypertension: Secondary | ICD-10-CM | POA: Diagnosis not present

## 2012-08-11 DIAGNOSIS — I209 Angina pectoris, unspecified: Secondary | ICD-10-CM | POA: Diagnosis not present

## 2012-08-11 DIAGNOSIS — E039 Hypothyroidism, unspecified: Secondary | ICD-10-CM | POA: Diagnosis not present

## 2012-08-11 DIAGNOSIS — E538 Deficiency of other specified B group vitamins: Secondary | ICD-10-CM | POA: Diagnosis not present

## 2012-08-11 DIAGNOSIS — E7800 Pure hypercholesterolemia, unspecified: Secondary | ICD-10-CM | POA: Diagnosis not present

## 2012-08-17 DIAGNOSIS — Z1231 Encounter for screening mammogram for malignant neoplasm of breast: Secondary | ICD-10-CM | POA: Diagnosis not present

## 2012-09-12 ENCOUNTER — Other Ambulatory Visit: Payer: Self-pay | Admitting: Physician Assistant

## 2012-11-09 DIAGNOSIS — J029 Acute pharyngitis, unspecified: Secondary | ICD-10-CM | POA: Diagnosis not present

## 2012-11-22 DIAGNOSIS — H01009 Unspecified blepharitis unspecified eye, unspecified eyelid: Secondary | ICD-10-CM | POA: Diagnosis not present

## 2012-11-22 DIAGNOSIS — H2510 Age-related nuclear cataract, unspecified eye: Secondary | ICD-10-CM | POA: Diagnosis not present

## 2012-11-25 ENCOUNTER — Ambulatory Visit: Payer: Medicare Other | Admitting: Cardiovascular Disease

## 2012-11-25 DIAGNOSIS — R51 Headache: Secondary | ICD-10-CM | POA: Diagnosis not present

## 2012-11-25 DIAGNOSIS — J329 Chronic sinusitis, unspecified: Secondary | ICD-10-CM | POA: Diagnosis not present

## 2012-12-05 DIAGNOSIS — J342 Deviated nasal septum: Secondary | ICD-10-CM | POA: Diagnosis not present

## 2012-12-05 DIAGNOSIS — J328 Other chronic sinusitis: Secondary | ICD-10-CM | POA: Diagnosis not present

## 2012-12-05 DIAGNOSIS — J329 Chronic sinusitis, unspecified: Secondary | ICD-10-CM | POA: Diagnosis not present

## 2012-12-08 DIAGNOSIS — R05 Cough: Secondary | ICD-10-CM | POA: Diagnosis not present

## 2012-12-28 ENCOUNTER — Ambulatory Visit: Payer: Medicare Other | Admitting: Cardiovascular Disease

## 2012-12-29 ENCOUNTER — Encounter: Payer: Self-pay | Admitting: Cardiovascular Disease

## 2012-12-29 ENCOUNTER — Ambulatory Visit (INDEPENDENT_AMBULATORY_CARE_PROVIDER_SITE_OTHER): Payer: Medicare Other | Admitting: Cardiovascular Disease

## 2012-12-29 VITALS — BP 133/80 | HR 73 | Ht 62.0 in | Wt 215.4 lb

## 2012-12-29 DIAGNOSIS — R0609 Other forms of dyspnea: Secondary | ICD-10-CM

## 2012-12-29 DIAGNOSIS — I25810 Atherosclerosis of coronary artery bypass graft(s) without angina pectoris: Secondary | ICD-10-CM

## 2012-12-29 DIAGNOSIS — G4730 Sleep apnea, unspecified: Secondary | ICD-10-CM

## 2012-12-29 DIAGNOSIS — I10 Essential (primary) hypertension: Secondary | ICD-10-CM

## 2012-12-29 DIAGNOSIS — N189 Chronic kidney disease, unspecified: Secondary | ICD-10-CM

## 2012-12-29 DIAGNOSIS — R0989 Other specified symptoms and signs involving the circulatory and respiratory systems: Secondary | ICD-10-CM

## 2012-12-29 DIAGNOSIS — R079 Chest pain, unspecified: Secondary | ICD-10-CM

## 2012-12-29 DIAGNOSIS — I2510 Atherosclerotic heart disease of native coronary artery without angina pectoris: Secondary | ICD-10-CM

## 2012-12-29 DIAGNOSIS — E785 Hyperlipidemia, unspecified: Secondary | ICD-10-CM

## 2012-12-29 MED ORDER — ROSUVASTATIN CALCIUM 5 MG PO TABS: 5.0000 mg | ORAL_TABLET | Freq: Every day | ORAL | Status: DC

## 2012-12-29 NOTE — Progress Notes (Signed)
Patient ID: Annette Sharp, female   DOB: 1940/10/31, 72 y.o.   MRN: [id number]      SUBJECTIVE: The patient is a 72 year old female with a history of coronary artery disease status post Cypher drug-eluting stent to right coronary artery in June of 2007. She also had a negative ischemia evaluation 06/19/2008 with a Myoview and demonstrated normal LV systolic function. She does have a history of chronic renal insufficiency. She also has HTN, hyperlipidemia, and obstructive sleep apnea, with obesity.  She had been having ongoing chest pain in spite of being started on Imdur and was evaluated by Dr. Andee Lineman in 3/14. She then underwent coronary angiography with the following results:  CORONARY STATUS: Moderate non-obstructive ASCAD   OTHER: Cath showed moderate CAD. LAD has a 50% proximal lesion. LCX has  a 30% proximal lesion. RCA has a 50% mid lesion. Stent in RCA is widely  patent.   She feels fatigued on most days. She has not been using CPAP. She gets short of breath with exertion. She gets a cramping sensation in the right inframammary region, occurring 3-4 times daily and sometimes once a week. Her symptoms prior to stent placement were chest pain radiating to the back, and has not had this sensation.  She recently started swimming within the past 2 weeks. She goes with her daughter.  She's noticed some bruises on the medial aspect of both thighs as well, and does say her dog jumps up on her lap frequently.     Allergies  Allergen Reactions  . Ace Inhibitors     REACTION: Cough  . Gemfibrozil     REACTION: Muscle Aches  . Moxifloxacin     REACTION: Rash  . Statins     REACTION: Muscle Cramps    Current Outpatient Prescriptions  Medication Sig Dispense Refill  . acyclovir (ZOVIRAX) 400 MG tablet Take one by mouth daily        . amLODipine (NORVASC) 10 MG tablet Take 1 tablet (10 mg total) by mouth daily.  30 tablet  6  . aspirin 81 MG tablet Take 81 mg by mouth daily.        .  cetirizine (ZYRTEC) 10 MG tablet Take 10 mg by mouth daily.        . citalopram (CELEXA) 20 MG tablet Take 20 mg by mouth daily.        . clopidogrel (PLAVIX) 75 MG tablet Take 75 mg by mouth daily.        . cyclobenzaprine (FLEXERIL) 5 MG tablet Take 5 mg by mouth as needed.      . isosorbide mononitrate (IMDUR) 120 MG 24 hr tablet Take 1 1/2 tabs (180mg ) by mouth daily  45 tablet  6  . levothyroxine (SYNTHROID, LEVOTHROID) 88 MCG tablet Take 88 mcg by mouth daily.        . nitroGLYCERIN (NITROSTAT) 0.4 MG SL tablet Place 1 tablet (0.4 mg total) under the tongue every 5 (five) minutes as needed for chest pain.  25 tablet  3  . omeprazole (PRILOSEC) 20 MG capsule Take 20 mg by mouth daily.         No current facility-administered medications for this visit.    Past Medical History  Diagnosis Date  . Overweight(278.02)     OBESITY  . Hyperlipidemia   . Other and unspecified angina pectoris     ANGINA  . Hypertension   . Back pain   . Peptic ulcer disease   . Thyroid disease  TREATED HYPOTHYROIDISM  . Sleep apnea     SEVERE OBSTRUCTIVE  . 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 Stroke   . Depression   . Chronic renal insufficiency     GFR 45 ML'S PER MINUTE  . Coronary artery disease     S/P CYPHER DRUG-ELUTING STENTS TO RIGHT CORONARY ARTERY JUNE 2007  . Coronary atherosclerosis of native coronary artery     NEGATIVE LEXISCAN FOR ISCHEMIA MARCH 23,2010 NORMAL LV FUNCTTION    Past Surgical History  Procedure Laterality Date  . Carpal tunnel release    . Cholecystectomy    . Total abdominal hysterectomy w/ bilateral salpingoophorectomy      History   Social History  . Marital Status: Married    Spouse Name: N/A    Number of Children: N/A  . Years of Education: N/A   Occupational History  . Not on file.   Social History Main Topics  . Smoking status: Never Smoker   . Smokeless tobacco: Never Used  . Alcohol Use: No    . Drug Use: No  . Sexual Activity: Not on file   Other Topics Concern  . Not on file   Social History Narrative  . No narrative on file     Filed Vitals:   12/29/12 1309  BP: 133/80  Pulse: 73  Height: 5\' 2"  (1.575 m)  Weight: 215 lb 6.4 oz (97.705 kg)    PHYSICAL EXAM General: NAD Neck: No JVD, no thyromegaly or thyroid nodule.  Lungs: faint dry crackles at right base, otherwise clear to auscultation bilaterally with normal respiratory effort. CV: Nondisplaced PMI.  Heart regular S1/S2, no S3/S4, no murmur.  No peripheral edema.  No carotid bruit.  Normal pedal pulses.  Abdomen: Soft, nontender, no hepatosplenomegaly, no distention.  Neurologic: Alert and oriented x 3.  Psych: Normal affect. Extremities: No clubbing or cyanosis.   ECG: reviewed and available in electronic records.      ASSESSMENT AND PLAN: 1. CAD: continues to have moderate CAD, which will be medically managed. Continue ASA, Plavix, and Imdur. She has not tolerated statins in the past, but is uncertain as to which one(s) they are, as they've caused muscle cramps. I will try Crestor 5 mg daily, but asked her to check with her pharmacist to see if she's tried this before. Her present right inframammary cramping appears to be musculoskeletal in etiology. 2. HTN: controlled on current therapy. 3. Dyspnea on exertion: this may be due to cardiovascular deconditioning, as she has only recently begun to exercise. She has sleep apnea and has not used CPAP. I've encouraged her to begin using it. She likely has some component of obesity hypoventilation syndrome as well. I will obtain an echocardiogram to evaluate biventricular function. I will also obtain PFT's to evaluate for intrinsic pulmonary pathology. 4. Hyperlipidemia: Crestor as above. 5. Sleep apnea: encouraged CPAP use.   Prentice Docker, M.D., F.A.C.C.

## 2012-12-29 NOTE — Patient Instructions (Signed)
Your physician has requested that you have an echocardiogram. Echocardiography is a painless test that uses sound waves to create images of your heart. It provides your doctor with information about the size and shape of your heart and how well your heart's chambers and valves are working. This procedure takes approximately one hour. There are no restrictions for this procedure. Your physician has recommended that you have a pulmonary function test. Pulmonary Function Tests are a group of tests that measure how well air moves in and out of your lungs. Office will contact with results via phone or letter.   Begin Crestor 5mg  daily - new sent to pharm Continue all other medications.   Follow up in  4-6 weeks

## 2013-01-15 DIAGNOSIS — Z23 Encounter for immunization: Secondary | ICD-10-CM | POA: Diagnosis not present

## 2013-01-18 ENCOUNTER — Other Ambulatory Visit: Payer: Self-pay

## 2013-01-18 ENCOUNTER — Other Ambulatory Visit (INDEPENDENT_AMBULATORY_CARE_PROVIDER_SITE_OTHER): Payer: Medicare Other

## 2013-01-18 DIAGNOSIS — I059 Rheumatic mitral valve disease, unspecified: Secondary | ICD-10-CM

## 2013-01-18 DIAGNOSIS — I25810 Atherosclerosis of coronary artery bypass graft(s) without angina pectoris: Secondary | ICD-10-CM

## 2013-01-18 DIAGNOSIS — R0609 Other forms of dyspnea: Secondary | ICD-10-CM

## 2013-01-20 ENCOUNTER — Ambulatory Visit (HOSPITAL_COMMUNITY)
Admission: RE | Admit: 2013-01-20 | Discharge: 2013-01-20 | Disposition: A | Discharge status: Home / Self Care | Payer: Medicare Other | Source: Ambulatory Visit | Attending: Cardiovascular Disease | Admitting: Cardiovascular Disease

## 2013-01-20 ENCOUNTER — Telehealth: Payer: Self-pay | Admitting: *Deleted

## 2013-01-20 DIAGNOSIS — R0602 Shortness of breath: Secondary | ICD-10-CM | POA: Insufficient documentation

## 2013-01-20 DIAGNOSIS — I25810 Atherosclerosis of coronary artery bypass graft(s) without angina pectoris: Secondary | ICD-10-CM

## 2013-01-20 MED ORDER — ALBUTEROL SULFATE (5 MG/ML) 0.5% IN NEBU
2.5000 mg | INHALATION_SOLUTION | Freq: Once | RESPIRATORY_TRACT | Status: AC
  Administered 2013-01-20: 2.5 mg via RESPIRATORY_TRACT

## 2013-01-20 NOTE — Telephone Encounter (Signed)
Notes Recorded by Lesle Chris, LPN on 03/47/4259 at 4:35 PM Patient notified and verbalized understanding. Already has follow up scheduled for 10/30 with Dr. Purvis Sheffield.

## 2013-01-20 NOTE — Telephone Encounter (Signed)
Message copied by Lesle Chris on Fri Jan 20, 2013  4:35 PM ------      Message from: Prentice Docker A      Created: Thu Jan 19, 2013  3:13 PM       No changes in Rx. ------

## 2013-01-20 NOTE — Procedures (Signed)
Annette Sharp, [place]                 ACCOUNT NO.:  [id number]  MEDICAL RECORD NO.:  [id number]  LOCATION:  RESP                          FACILITY:  [facility]  PHYSICIAN:  Jahlil Ziller L. Juanetta Gosling, M.D.DATE OF BIRTH:  Jun 05, 1940  DATE OF PROCEDURE: DATE OF DISCHARGE:                           PULMONARY FUNCTION TEST   REASON FOR PULMONARY FUNCTION TESTING:  Shortness of breath on exertion. 1. Spirometry is normal. 2. Lung volumes show elevated total lung capacity and elevated     residual volume compared to predicted. 3. DLCO is mildly reduced. 4. Airway resistance is normal. 5. There is no significant bronchodilator improvement. 6. No definite cause of shortness of breath seen on this pulmonary     function test.     Ramon Dredge L. Juanetta Gosling, M.D.     EL[facility]/MEDQ  D:  01/20/2013  T:  01/20/2013  Job:  [id number]

## 2013-01-26 ENCOUNTER — Encounter: Payer: Self-pay | Admitting: Cardiovascular Disease

## 2013-01-26 ENCOUNTER — Ambulatory Visit (INDEPENDENT_AMBULATORY_CARE_PROVIDER_SITE_OTHER): Payer: Medicare Other | Admitting: Cardiovascular Disease

## 2013-01-26 VITALS — BP 152/77 | HR 70 | Ht 62.0 in | Wt 216.0 lb

## 2013-01-26 DIAGNOSIS — J439 Emphysema, unspecified: Secondary | ICD-10-CM

## 2013-01-26 DIAGNOSIS — E785 Hyperlipidemia, unspecified: Secondary | ICD-10-CM

## 2013-01-26 DIAGNOSIS — I2510 Atherosclerotic heart disease of native coronary artery without angina pectoris: Secondary | ICD-10-CM | POA: Diagnosis not present

## 2013-01-26 DIAGNOSIS — R0609 Other forms of dyspnea: Secondary | ICD-10-CM | POA: Diagnosis not present

## 2013-01-26 DIAGNOSIS — I209 Angina pectoris, unspecified: Secondary | ICD-10-CM | POA: Diagnosis not present

## 2013-01-26 DIAGNOSIS — G4730 Sleep apnea, unspecified: Secondary | ICD-10-CM

## 2013-01-26 DIAGNOSIS — I10 Essential (primary) hypertension: Secondary | ICD-10-CM | POA: Diagnosis not present

## 2013-01-26 DIAGNOSIS — R0989 Other specified symptoms and signs involving the circulatory and respiratory systems: Secondary | ICD-10-CM

## 2013-01-26 DIAGNOSIS — J438 Other emphysema: Secondary | ICD-10-CM

## 2013-01-26 LAB — PULMONARY FUNCTION TEST

## 2013-01-26 MED ORDER — RANOLAZINE ER 500 MG PO TB12: 500.0000 mg | ORAL_TABLET | Freq: Two times a day (BID) | ORAL | Status: DC

## 2013-01-26 MED ORDER — ALBUTEROL SULFATE HFA 108 (90 BASE) MCG/ACT IN AERS: 1.0000 | INHALATION_SPRAY | Freq: Four times a day (QID) | RESPIRATORY_TRACT | Status: DC | PRN

## 2013-01-26 NOTE — Progress Notes (Signed)
Patient ID: Annette Sharp, female   DOB: 1940/06/06, 72 y.o.   MRN: [id number]      SUBJECTIVE: Annette Sharp is a 72 year old female with a history of coronary artery disease status post Cypher drug-eluting stent to right coronary artery in June of 2007. She also had a negative ischemia evaluation 06/19/2008 with a Myoview and demonstrated normal LV systolic function. She does have a history of chronic renal insufficiency. She also has HTN, hyperlipidemia, and obstructive sleep apnea, with obesity.  She had been having ongoing chest pain in spite of being started on Imdur and was evaluated by Dr. Andee Lineman in 3/14. She then underwent coronary angiography with the following results:  CORONARY STATUS: Moderate non-obstructive ASCAD   OTHER: Cath showed moderate CAD. LAD has a 50% proximal lesion. LCX has  a 30% proximal lesion. RCA has a 50% mid lesion. Stent in RCA is widely  patent.   She feels fatigued on most days. She has not been using CPAP, but plans to tonight. She gets short of breath with exertion. She gets a cramping sensation in the right inframammary region, occurring 3-4 times daily and sometimes once a week. Her symptoms prior to stent placement were chest pain radiating to the back, and has not had this sensation. She started swimming.  She is here to f/u on the results of cardiopulmonary testing. Her PFT's showed possible early emphesematous changes. Her echo showed normal LV systolic function with grade I diastolic dysfunction.  She continues to get chest discomfort with exertion, in spite of the increased Imdur dose. She has been fatigued since starting both Crestor and since she begun swimming, but again emphasizes it is with exertion followed by chest pain.   Allergies  Allergen Reactions  . Ace Inhibitors     REACTION: Cough  . Gemfibrozil     REACTION: Muscle Aches  . Moxifloxacin     REACTION: Rash  . Statins     REACTION: Muscle Cramps    Current Outpatient  Prescriptions  Medication Sig Dispense Refill  . acyclovir (ZOVIRAX) 400 MG tablet Take one by mouth daily        . amLODipine (NORVASC) 10 MG tablet Take 10 mg by mouth daily.      Marland Kitchen aspirin 81 MG tablet Take 81 mg by mouth daily.        . cetirizine (ZYRTEC) 10 MG tablet Take 10 mg by mouth daily.        . citalopram (CELEXA) 20 MG tablet Take 20 mg by mouth daily.        . clopidogrel (PLAVIX) 75 MG tablet Take 75 mg by mouth daily.        . cyclobenzaprine (FLEXERIL) 5 MG tablet Take 5 mg by mouth as needed.      . isosorbide mononitrate (IMDUR) 120 MG 24 hr tablet Take 1 1/2 tabs (180mg ) by mouth daily  45 tablet  6  . levothyroxine (SYNTHROID, LEVOTHROID) 88 MCG tablet Take 88 mcg by mouth daily.        . nitroGLYCERIN (NITROSTAT) 0.4 MG SL tablet Place 0.4 mg under the tongue every 5 (five) minutes as needed for chest pain.      Marland Kitchen omeprazole (PRILOSEC) 20 MG capsule Take 20 mg by mouth daily.        . rosuvastatin (CRESTOR) 5 MG tablet Take 1 tablet (5 mg total) by mouth daily.  30 tablet  6   No current facility-administered medications for this visit.  Past Medical History  Diagnosis Date  . Overweight(278.02)     OBESITY  . Hyperlipidemia   . Other and unspecified angina pectoris     ANGINA  . Hypertension   . Back pain   . Peptic ulcer disease   . Thyroid disease     TREATED HYPOTHYROIDISM  . Sleep apnea     SEVERE OBSTRUCTIVE  . 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 Stroke   . Depression   . Chronic renal insufficiency     GFR 45 ML'S PER MINUTE  . Coronary artery disease     S/P CYPHER DRUG-ELUTING STENTS TO RIGHT CORONARY ARTERY JUNE 2007  . Coronary atherosclerosis of native coronary artery     NEGATIVE LEXISCAN FOR ISCHEMIA MARCH 23,2010 NORMAL LV FUNCTTION    Past Surgical History  Procedure Laterality Date  . Carpal tunnel release    . Cholecystectomy    . Total abdominal hysterectomy w/ bilateral  salpingoophorectomy      History   Social History  . Marital Status: Married    Spouse Name: N/A    Number of Children: N/A  . Years of Education: N/A   Occupational History  . Not on file.   Social History Main Topics  . Smoking status: Never Smoker   . Smokeless tobacco: Never Used  . Alcohol Use: No  . Drug Use: No  . Sexual Activity: Not on file   Other Topics Concern  . Not on file   Social History Narrative  . No narrative on file     Filed Vitals:   01/26/13 1311  BP: 152/77  Pulse: 70  Height: 5\' 2"  (1.575 m)  Weight: 216 lb (97.977 kg)  SpO2: 95%    PHYSICAL EXAM General: NAD Neck: No JVD, no thyromegaly or thyroid nodule.  Lungs: Clear to auscultation bilaterally with normal respiratory effort. CV: Nondisplaced PMI.  Heart regular S1/S2, no S3/S4, I/VI systolic murmur at LSB.  No peripheral edema.  No carotid bruit.  Normal pedal pulses.  Abdomen: Soft, nontender, no hepatosplenomegaly, no distention.  Neurologic: Alert and oriented x 3.  Psych: Normal affect. Extremities: No clubbing or cyanosis.   ECG: reviewed and available in electronic records.   ECHO: - Left ventricle: The cavity size was mildly reduced. Wall thickness was increased in a pattern of mild LVH. Systolic function was vigorous. The estimated ejection fraction was in the range of 65% to 70%. Somewhat sigmoid-shaped basal septum with increased LVOT flow, although no definite gradient. Wall motion was normal; there were no regional wall motion abnormalities. Doppler parameters are consistent with abnormal left ventricular relaxation (grade 1 diastolic dysfunction). Doppler parameters are consistent with high ventricular filling pressure. - Aortic valve: Poorly visualized. Trileaflet; mildly thickened leaflets. No significant regurgitation. Mean gradient: 6mm Hg (S). - Mitral valve: Calcified annulus. Mildly thickened leaflets . Trivial regurgitation. - Right ventricle:  Systolic function was normal. - Tricuspid valve: Trivial regurgitation. Peak RV-RA gradient: 26mm Hg (S). - Inferior vena cava: The vessel was normal in size. No assessment with respiration to evaluate CVP. - Pericardium, extracardiac: A prominent pericardial fat pad was present. Impressions:  - Comparison to prior study July 2013. There is mild LVH with somewhat sigmoid-shaped septum and increased LVOT flow, although no definite gradient. LVEF 65-70% with grade 1 diastolic dysfunction and increased filling pressures. Mildly thickened aortic and mitral valves. MAC with trivial mitral regurgitation. Normal RV size and contraction. Trace tricuspid regurgitation,  RV-RA gradient 26 mm mercury. Prominent epicardial adipose tissue.      ASSESSMENT AND PLAN: 1. CAD: continues to have moderate CAD, which will be medically managed. Continue ASA and Plavix. The Imdur has not helped and I suspect she may have developed nitrate tolerance, and warrants a nitrate-free interval. I will start Ranexa 500 mg bid. She denies muscle cramping since starting Crestor 5 mg daily, but has not tolerated other statins in the past. While her chest pain may be musculoskeletal in etiology (located in the right inframammary region primarily), she may have symptoms from microvascular ischemia.  2. HTN: controlled on current therapy. Slightly elevated today but she is going to a funeral right after this office visit. 3. Dyspnea on exertion: this may be due to cardiovascular deconditioning, as she has only recently begun to exercise. She has sleep apnea and has not used CPAP, but plans to commence doing so tonight. She likely has some component of obesity hypoventilation syndrome as well. As her PFT's showed possible early emphysema, I will prescribe an albuterol inhaler to see if this helps. 4. Hyperlipidemia: Crestor as above.  5. Sleep apnea: encouraged CPAP use.    Annette Sharp, M.D., F.A.C.C.

## 2013-01-26 NOTE — Patient Instructions (Signed)
   Stop Imdur  Begin Ranexa 500mg  twice a day    Begin Albuterol MDI - one puff every 6 hours x 5 days, then as needed for SOB  Continue all other medications.   Patient to call office back in 2 weeks with update.   Follow up in  3-4 months

## 2013-02-02 DIAGNOSIS — I259 Chronic ischemic heart disease, unspecified: Secondary | ICD-10-CM | POA: Diagnosis not present

## 2013-02-02 DIAGNOSIS — F329 Major depressive disorder, single episode, unspecified: Secondary | ICD-10-CM | POA: Diagnosis not present

## 2013-02-02 DIAGNOSIS — E7800 Pure hypercholesterolemia, unspecified: Secondary | ICD-10-CM | POA: Diagnosis not present

## 2013-02-02 DIAGNOSIS — I10 Essential (primary) hypertension: Secondary | ICD-10-CM | POA: Diagnosis not present

## 2013-02-02 DIAGNOSIS — E039 Hypothyroidism, unspecified: Secondary | ICD-10-CM | POA: Diagnosis not present

## 2013-02-09 DIAGNOSIS — I209 Angina pectoris, unspecified: Secondary | ICD-10-CM | POA: Diagnosis not present

## 2013-02-09 DIAGNOSIS — E538 Deficiency of other specified B group vitamins: Secondary | ICD-10-CM | POA: Diagnosis not present

## 2013-02-09 DIAGNOSIS — E7800 Pure hypercholesterolemia, unspecified: Secondary | ICD-10-CM | POA: Diagnosis not present

## 2013-02-09 DIAGNOSIS — E039 Hypothyroidism, unspecified: Secondary | ICD-10-CM | POA: Diagnosis not present

## 2013-02-16 DIAGNOSIS — H6590 Unspecified nonsuppurative otitis media, unspecified ear: Secondary | ICD-10-CM | POA: Diagnosis not present

## 2013-02-16 DIAGNOSIS — M109 Gout, unspecified: Secondary | ICD-10-CM | POA: Diagnosis not present

## 2013-05-11 ENCOUNTER — Ambulatory Visit (INDEPENDENT_AMBULATORY_CARE_PROVIDER_SITE_OTHER): Payer: Medicare Other | Admitting: Cardiovascular Disease

## 2013-05-11 ENCOUNTER — Encounter: Payer: Self-pay | Admitting: Cardiovascular Disease

## 2013-05-11 VITALS — BP 144/80 | HR 65 | Ht 62.0 in | Wt 214.0 lb

## 2013-05-11 DIAGNOSIS — G4730 Sleep apnea, unspecified: Secondary | ICD-10-CM | POA: Diagnosis not present

## 2013-05-11 DIAGNOSIS — E785 Hyperlipidemia, unspecified: Secondary | ICD-10-CM

## 2013-05-11 DIAGNOSIS — I2510 Atherosclerotic heart disease of native coronary artery without angina pectoris: Secondary | ICD-10-CM | POA: Diagnosis not present

## 2013-05-11 DIAGNOSIS — I10 Essential (primary) hypertension: Secondary | ICD-10-CM | POA: Diagnosis not present

## 2013-05-11 DIAGNOSIS — R0989 Other specified symptoms and signs involving the circulatory and respiratory systems: Secondary | ICD-10-CM

## 2013-05-11 DIAGNOSIS — R0600 Dyspnea, unspecified: Secondary | ICD-10-CM

## 2013-05-11 DIAGNOSIS — R0609 Other forms of dyspnea: Secondary | ICD-10-CM

## 2013-05-11 NOTE — Progress Notes (Signed)
Patient ID: ADALYNA GODBEE, female   DOB: 08-31-1940, 72 y.o.   MRN: [id number]      SUBJECTIVE: Mrs. Miers is a 73 year old female with a history of coronary artery disease status post Cypher drug-eluting stent to right coronary artery in June of 2007. She also had a negative ischemia evaluation 06/19/2008 with a Myoview and demonstrated normal LV systolic function. She does have a history of chronic renal insufficiency. She also has HTN, hyperlipidemia, and obstructive sleep apnea, with obesity.  She had been having ongoing chest pain in spite of being started on Imdur and was evaluated by Dr. Dannielle Burn in 05/2012. She then underwent coronary angiography with the following results:  CORONARY STATUS: Moderate non-obstructive ASCAD  OTHER: Cath showed moderate CAD. LAD has a 50% proximal lesion. LCX has  a 30% proximal lesion. RCA has a 50% mid lesion. Stent in RCA is widely  patent.   Since starting Ranexa at her last visit, she has been doing very well and very seldom experiences chest pain. She has used the albuterol inhaler on an as-needed basis and her shortness of breath has improved considerably. She denies lightheadedness, dizziness, orthopnea and leg swelling.       Allergies  Allergen Reactions  . Ace Inhibitors     REACTION: Cough  . Gemfibrozil     REACTION: Muscle Aches  . Moxifloxacin     REACTION: Rash  . Statins     REACTION: Muscle Cramps    Current Outpatient Prescriptions  Medication Sig Dispense Refill  . acyclovir (ZOVIRAX) 400 MG tablet Take one by mouth daily        . albuterol (PROVENTIL HFA) 108 (90 BASE) MCG/ACT inhaler Inhale 1 puff into the lungs every 6 (six) hours as needed for shortness of breath.  1 Inhaler  1  . amLODipine (NORVASC) 10 MG tablet Take 10 mg by mouth daily.      Marland Kitchen aspirin 81 MG tablet Take 81 mg by mouth daily.        . cetirizine (ZYRTEC) 10 MG tablet Take 10 mg by mouth daily.        . citalopram (CELEXA) 20 MG tablet Take 20 mg by mouth  daily.        . clopidogrel (PLAVIX) 75 MG tablet Take 75 mg by mouth daily.        . cyclobenzaprine (FLEXERIL) 5 MG tablet Take 5 mg by mouth as needed.      Marland Kitchen levothyroxine (SYNTHROID, LEVOTHROID) 88 MCG tablet Take 88 mcg by mouth daily.        . nitroGLYCERIN (NITROSTAT) 0.4 MG SL tablet Place 0.4 mg under the tongue every 5 (five) minutes as needed for chest pain.      Marland Kitchen omeprazole (PRILOSEC) 20 MG capsule Take 20 mg by mouth daily.        . ranolazine (RANEXA) 500 MG 12 hr tablet Take 1 tablet (500 mg total) by mouth 2 (two) times daily.  60 tablet  6  . rosuvastatin (CRESTOR) 5 MG tablet Take 1 tablet (5 mg total) by mouth daily.  30 tablet  6   No current facility-administered medications for this visit.    Past Medical History  Diagnosis Date  . Overweight     OBESITY  . Hyperlipidemia   . Other and unspecified angina pectoris     ANGINA  . Hypertension   . Back pain   . Peptic ulcer disease   . Thyroid disease  TREATED HYPOTHYROIDISM  . Sleep apnea     SEVERE OBSTRUCTIVE  . 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 Stroke   . Depression   . Chronic renal insufficiency     GFR 45 ML'S PER MINUTE  . Coronary artery disease     S/P CYPHER DRUG-ELUTING STENTS TO RIGHT CORONARY ARTERY JUNE 2007  . Coronary atherosclerosis of native coronary artery     NEGATIVE LEXISCAN FOR ISCHEMIA MARCH 23,2010 NORMAL LV FUNCTTION    Past Surgical History  Procedure Laterality Date  . Carpal tunnel release    . Cholecystectomy    . Total abdominal hysterectomy w/ bilateral salpingoophorectomy      History   Social History  . Marital Status: Married    Spouse Name: N/A    Number of Children: N/A  . Years of Education: N/A   Occupational History  . Not on file.   Social History Main Topics  . Smoking status: Never Smoker   . Smokeless tobacco: Never Used  . Alcohol Use: No  . Drug Use: No  . Sexual Activity: Not on file    Other Topics Concern  . Not on file   Social History Narrative  . No narrative on file     Filed Vitals:   05/11/13 1305  Height: 5\' 2"  (1.575 m)   BP 144/80 Pulse 65   PHYSICAL EXAM General: NAD Neck: No JVD, no thyromegaly or thyroid nodule.  Lungs: Clear to auscultation bilaterally with normal respiratory effort. CV: Nondisplaced PMI.  Heart regular S1/S2, no S3/S4, no murmur.  No peripheral edema.  No carotid bruit.  Normal pedal pulses.  Abdomen: Soft, nontender, no hepatosplenomegaly, no distention.  Neurologic: Alert and oriented x 3.  Psych: Normal affect. Extremities: No clubbing or cyanosis.   ECG: reviewed and available in electronic records.      ASSESSMENT AND PLAN: 1. CAD: continues to have moderate CAD, which will be medically managed. Continue ASA and Plavix. At her last visit, I deeemed a nitrate-free interval was necessary, and started Ranexa 500 mg bid. She denies muscle cramping since starting Crestor 5 mg daily, but has not tolerated other statins in the past. She may have had symptoms from microvascular ischemia, which are currently well controlled. 2. HTN: mildly elevated today. I've asked her to monitor her BP at home over the next month (twice weekly) and to inform me of these results, so that I can determine whether or not further antihypertensive titration is necessary. 3. Dyspnea on exertion: improved symptomatically. As her PFT's showed possible early emphysema, I prescribed an albuterol inhaler prn at her last visit, and this appears to have helped alleviate her symptoms. 4. Hyperlipidemia: Crestor as above. Lipids apparently checked by Dr. Nadara Mustard recently. I will try and obtain these results. 5. Sleep apnea: encouraged CPAP use.  Dispo: f/u 6 months.  Kate Sable, M.D., F.A.C.C.

## 2013-05-11 NOTE — Patient Instructions (Signed)
Your physician recommends that you schedule a follow-up appointment in: 6 months with Dr. Koneswaran. You should receive a letter in the mail in 4 months. If you do not receive this letter by June 2015 call our office to schedule this appointment.   Your physician recommends that you continue on your current medications as directed. Please refer to the Current Medication list given to you today.   

## 2013-06-19 ENCOUNTER — Emergency Department (HOSPITAL_COMMUNITY): Payer: Medicare Other

## 2013-06-19 ENCOUNTER — Encounter (HOSPITAL_COMMUNITY): Payer: Self-pay | Admitting: Emergency Medicine

## 2013-06-19 ENCOUNTER — Telehealth: Payer: Self-pay | Admitting: Cardiovascular Disease

## 2013-06-19 ENCOUNTER — Emergency Department (HOSPITAL_COMMUNITY)
Admission: EM | Admit: 2013-06-19 | Discharge: 2013-06-19 | Disposition: A | Discharge status: Home / Self Care | Payer: Medicare Other | Attending: Emergency Medicine | Admitting: Emergency Medicine

## 2013-06-19 DIAGNOSIS — E785 Hyperlipidemia, unspecified: Secondary | ICD-10-CM | POA: Insufficient documentation

## 2013-06-19 DIAGNOSIS — Z8673 Personal history of transient ischemic attack (TIA), and cerebral infarction without residual deficits: Secondary | ICD-10-CM | POA: Diagnosis not present

## 2013-06-19 DIAGNOSIS — R0789 Other chest pain: Secondary | ICD-10-CM

## 2013-06-19 DIAGNOSIS — J3489 Other specified disorders of nose and nasal sinuses: Secondary | ICD-10-CM | POA: Insufficient documentation

## 2013-06-19 DIAGNOSIS — Z7902 Long term (current) use of antithrombotics/antiplatelets: Secondary | ICD-10-CM | POA: Diagnosis not present

## 2013-06-19 DIAGNOSIS — R0602 Shortness of breath: Secondary | ICD-10-CM | POA: Insufficient documentation

## 2013-06-19 DIAGNOSIS — E079 Disorder of thyroid, unspecified: Secondary | ICD-10-CM | POA: Insufficient documentation

## 2013-06-19 DIAGNOSIS — Z7982 Long term (current) use of aspirin: Secondary | ICD-10-CM | POA: Insufficient documentation

## 2013-06-19 DIAGNOSIS — Z9889 Other specified postprocedural states: Secondary | ICD-10-CM | POA: Insufficient documentation

## 2013-06-19 DIAGNOSIS — E669 Obesity, unspecified: Secondary | ICD-10-CM | POA: Diagnosis not present

## 2013-06-19 DIAGNOSIS — R0600 Dyspnea, unspecified: Secondary | ICD-10-CM

## 2013-06-19 DIAGNOSIS — F329 Major depressive disorder, single episode, unspecified: Secondary | ICD-10-CM | POA: Diagnosis not present

## 2013-06-19 DIAGNOSIS — R0609 Other forms of dyspnea: Secondary | ICD-10-CM | POA: Insufficient documentation

## 2013-06-19 DIAGNOSIS — F3289 Other specified depressive episodes: Secondary | ICD-10-CM | POA: Diagnosis not present

## 2013-06-19 DIAGNOSIS — Z79899 Other long term (current) drug therapy: Secondary | ICD-10-CM | POA: Diagnosis not present

## 2013-06-19 DIAGNOSIS — R059 Cough, unspecified: Secondary | ICD-10-CM | POA: Diagnosis not present

## 2013-06-19 DIAGNOSIS — Z8711 Personal history of peptic ulcer disease: Secondary | ICD-10-CM | POA: Diagnosis not present

## 2013-06-19 DIAGNOSIS — I2510 Atherosclerotic heart disease of native coronary artery without angina pectoris: Secondary | ICD-10-CM | POA: Diagnosis not present

## 2013-06-19 DIAGNOSIS — R0989 Other specified symptoms and signs involving the circulatory and respiratory systems: Secondary | ICD-10-CM | POA: Insufficient documentation

## 2013-06-19 DIAGNOSIS — N189 Chronic kidney disease, unspecified: Secondary | ICD-10-CM | POA: Diagnosis not present

## 2013-06-19 DIAGNOSIS — M79609 Pain in unspecified limb: Secondary | ICD-10-CM | POA: Diagnosis not present

## 2013-06-19 DIAGNOSIS — I129 Hypertensive chronic kidney disease with stage 1 through stage 4 chronic kidney disease, or unspecified chronic kidney disease: Secondary | ICD-10-CM | POA: Insufficient documentation

## 2013-06-19 DIAGNOSIS — R05 Cough: Secondary | ICD-10-CM | POA: Insufficient documentation

## 2013-06-19 LAB — CBC WITH DIFFERENTIAL/PLATELET
Basophils Absolute: 0 10*3/uL (ref 0.0–0.1)
Basophils Relative: 1 % (ref 0–1)
Eosinophils Absolute: 0.1 10*3/uL (ref 0.0–0.7)
Eosinophils Relative: 2 % (ref 0–5)
HCT: 37.6 % (ref 36.0–46.0)
Hemoglobin: 13.2 g/dL (ref 12.0–15.0)
Lymphocytes Relative: 39 % (ref 12–46)
Lymphs Abs: 2.1 10*3/uL (ref 0.7–4.0)
MCH: 32 pg (ref 26.0–34.0)
MCHC: 35.1 g/dL (ref 30.0–36.0)
MCV: 91.3 fL (ref 78.0–100.0)
Monocytes Absolute: 0.6 10*3/uL (ref 0.1–1.0)
Monocytes Relative: 11 % (ref 3–12)
Neutro Abs: 2.6 10*3/uL (ref 1.7–7.7)
Neutrophils Relative %: 47 % (ref 43–77)
Platelets: 240 10*3/uL (ref 150–400)
RBC: 4.12 MIL/uL (ref 3.87–5.11)
RDW: 12.4 % (ref 11.5–15.5)
WBC: 5.4 10*3/uL (ref 4.0–10.5)

## 2013-06-19 LAB — BASIC METABOLIC PANEL
BUN: 14 mg/dL (ref 6–23)
CO2: 27 mEq/L (ref 19–32)
Calcium: 9.6 mg/dL (ref 8.4–10.5)
Chloride: 101 mEq/L (ref 96–112)
Creatinine, Ser: 1.24 mg/dL — ABNORMAL HIGH (ref 0.50–1.10)
GFR calc Af Amer: 50 mL/min — ABNORMAL LOW (ref >90)
GFR calc non Af Amer: 43 mL/min — ABNORMAL LOW (ref >90)
Glucose, Bld: 95 mg/dL (ref 70–99)
Potassium: 4.2 mEq/L (ref 3.7–5.3)
Sodium: 139 mEq/L (ref 137–147)

## 2013-06-19 LAB — TROPONIN I: Troponin I: <0.3 ng/mL (ref <0.30)

## 2013-06-19 NOTE — Discharge Instructions (Signed)
Chest Pain (Nonspecific) Chest pain has many causes. Your pain could be caused by something serious, such as a heart attack or a blood clot in the lungs. It could also be caused by something less serious, such as a chest bruise or a virus. Follow up with your doctor. More lab tests or other studies may be needed to find the cause of your pain. Most of the time, nonspecific chest pain will improve within 2 to 3 days of rest and mild pain medicine. HOME CARE  For chest bruises, you may put ice on the sore area for 15-20 minutes, 03-04 times a day. Do this only if it makes you feel better.  Put ice in a plastic bag.  Place a towel between the skin and the bag.  Rest for the next 2 to 3 days.  Go back to work if the pain improves.  See your doctor if the pain lasts longer than 1 to 2 weeks.  Only take medicine as told by your doctor.  Quit smoking if you smoke. GET HELP RIGHT AWAY IF:   There is more pain or pain that spreads to the arm, neck, jaw, back, or belly (abdomen).  You have shortness of breath.  You cough more than usual or cough up blood.  You have very bad back or belly pain, feel sick to your stomach (nauseous), or throw up (vomit).  You have very bad weakness.  You pass out (faint).  You have a fever. Any of these problems may be serious and may be an emergency. Do not wait to see if the problems will go away. Get medical help right away. Call your local emergency services 911 in [place] Do not drive yourself to the hospital. MAKE SURE YOU:   Understand these instructions.  Will watch this condition.  Will get help right away if you or your child is not doing well or gets worse. Document Released: 09/02/2007 Document Revised: 06/08/2011 Document Reviewed: 09/02/2007 [facility] Patient Information 2014 [place], [place].  Shortness of Breath Shortness of breath means you have trouble breathing. Shortness of breath may indicate that you have a medical problem. You  should seek immediate medical care for shortness of breath. CAUSES   Not enough oxygen in the air (as with high altitudes or a smoke-filled room).  Short-term (acute) lung disease, including:  Infections, such as pneumonia.  Fluid in the lungs, such as heart failure.  A blood clot in the lungs (pulmonary embolism).  Long-term (chronic) lung diseases.  Heart disease (heart attack, angina, heart failure, and others).  Low red blood cells (anemia).  Poor physical fitness. This can cause shortness of breath when you exercise.  Chest or back injuries or stiffness.  Being overweight.  Smoking.  Anxiety. This can make you feel like you are not getting enough air. DIAGNOSIS  Serious medical problems can usually be found during your physical exam. Tests may also be done to determine why you are having shortness of breath. Tests may include:  Chest X-rays.  Lung function tests.  Blood tests.  Electrocardiography.  Exercise testing.  Echocardiography.  Imaging scans. Your caregiver may not be able to find a cause for your shortness of breath after your exam. In this case, it is important to have a follow-up exam with your caregiver as directed.  TREATMENT  Treatment for shortness of breath depends on the cause of your symptoms and can vary greatly. HOME CARE INSTRUCTIONS   Do not smoke. Smoking is a common cause  of shortness of breath. If you smoke, ask for help to quit.  Avoid being around chemicals or things that may bother your breathing, such as paint fumes and dust.  Rest as needed. Slowly resume your usual activities.  If medicines were prescribed, take them as directed for the full length of time directed. This includes oxygen and any inhaled medicines.  Keep all follow-up appointments as directed by your caregiver. SEEK MEDICAL CARE IF:   Your condition does not improve in the time expected.  You have a hard time doing your normal activities even with  rest.  You have any side effects or problems with the medicines prescribed.  You develop any new symptoms. SEEK IMMEDIATE MEDICAL CARE IF:   Your shortness of breath gets worse.  You feel lightheaded, faint, or develop a cough not controlled with medicines.  You start coughing up blood.  You have pain with breathing.  You have chest pain or pain in your arms, shoulders, or abdomen.  You have a fever.  You are unable to walk up stairs or exercise the way you normally do. MAKE SURE YOU:  Understand these instructions.  Will watch your condition.  Will get help right away if you are not doing well or get worse. Document Released: 12/09/2000 Document Revised: 09/15/2011 Document Reviewed: 06/01/2011 [facility] Patient Information 2014 [place].

## 2013-06-19 NOTE — ED Notes (Signed)
Pt c/o chest pressure and squeezing x 3 days with sob.

## 2013-06-19 NOTE — Telephone Encounter (Signed)
Discussed with patient.  States she has not felt well x 3 days.  Weakness, fatigue.  No fever.  No c/o chest pain or dizziness.  Not really short of breath, feels like she can't get good breath in.  Patient states that she does not feel like this is a heart attack, does feel more lung related.  She did not feel that she needed to go to ED.  Advised her to see PMD Nadara Mustard) today.  If MD feels that she should be seen by [place], he can call & request appointment.  Dr. Bronson Ing is in [place] office today.

## 2013-06-19 NOTE — ED Provider Notes (Signed)
CSN: [id number]     Arrival date & time 06/19/13  1416 History  This chart was scribed for Annette Manifold, MD by Ludger Nutting, ED Scribe. This patient was seen in room APA10/APA10 and the patient's care was started 3:58 PM.    Chief Complaint  Patient presents with  . Chest Pain      The history is provided by the patient. No language interpreter was used.    HPI Comments: Annette Sharp is a 73 y.o. female who presents to the Emergency Department complaining of 3 days of unchanged chest pain. She describes this pain as pressure and squeezing. She has associated SOB that has been ongoing for the past week but has worsened over the last few days. She also has associated cough, sneezing, intermittent left lower leg pain. She states certain activities and singing worsens the pain. She uses an inhaler at home. She denies history of PE/DVT. She has a history of catheterization and stent.    Past Medical History  Diagnosis Date  . Overweight     OBESITY  . Hyperlipidemia   . Other and unspecified angina pectoris     ANGINA  . Hypertension   . Back pain   . Peptic ulcer disease   . Thyroid disease     TREATED HYPOTHYROIDISM  . Sleep apnea     SEVERE OBSTRUCTIVE  . 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 Stroke   . Depression   . Chronic renal insufficiency     GFR 45 ML'S PER MINUTE  . Coronary artery disease     S/P CYPHER DRUG-ELUTING STENTS TO RIGHT CORONARY ARTERY JUNE 2007  . Coronary atherosclerosis of native coronary artery     NEGATIVE LEXISCAN FOR ISCHEMIA MARCH 23,2010 NORMAL LV FUNCTTION   Past Surgical History  Procedure Laterality Date  . Carpal tunnel release    . Cholecystectomy    . Total abdominal hysterectomy w/ bilateral salpingoophorectomy    . Replacement total knee    . Rotator cuff repair     No family history on file. History  Substance Use Topics  . Smoking status: Never Smoker   . Smokeless tobacco: Never  Used  . Alcohol Use: No   OB History   Grav Para Term Preterm Abortions TAB SAB Ect Mult Living                 Review of Systems  HENT: Positive for sneezing.   Respiratory: Positive for cough and shortness of breath.   Musculoskeletal: Positive for myalgias (left lower leg).  All other systems reviewed and are negative.      Allergies  Ace inhibitors; Gemfibrozil; Moxifloxacin; and Statins  Home Medications   Current Outpatient Rx  Name  Route  Sig  Dispense  Refill  . acyclovir (ZOVIRAX) 400 MG tablet      Take one by mouth daily           . albuterol (PROVENTIL HFA) 108 (90 BASE) MCG/ACT inhaler   Inhalation   Inhale 1 puff into the lungs every 6 (six) hours as needed for shortness of breath.   1 Inhaler   1   . amLODipine (NORVASC) 10 MG tablet   Oral   Take 10 mg by mouth daily.         Marland Kitchen aspirin 81 MG tablet   Oral   Take 81 mg by mouth daily.           Marland Kitchen  cetirizine (ZYRTEC) 10 MG tablet   Oral   Take 10 mg by mouth daily.           . citalopram (CELEXA) 20 MG tablet   Oral   Take 20 mg by mouth daily.           . clopidogrel (PLAVIX) 75 MG tablet   Oral   Take 75 mg by mouth daily.           . cyclobenzaprine (FLEXERIL) 5 MG tablet   Oral   Take 5 mg by mouth as needed.         Marland Kitchen levothyroxine (SYNTHROID, LEVOTHROID) 88 MCG tablet   Oral   Take 88 mcg by mouth daily.           . nitroGLYCERIN (NITROSTAT) 0.4 MG SL tablet   Sublingual   Place 0.4 mg under the tongue every 5 (five) minutes as needed for chest pain.         Marland Kitchen omeprazole (PRILOSEC) 20 MG capsule   Oral   Take 20 mg by mouth daily.           . ranolazine (RANEXA) 500 MG 12 hr tablet   Oral   Take 1 tablet (500 mg total) by mouth 2 (two) times daily.   60 tablet   6     Stop Imdur, med changed 01/26/2013   . rosuvastatin (CRESTOR) 5 MG tablet   Oral   Take 1 tablet (5 mg total) by mouth daily.   30 tablet   6    BP 158/63  Pulse 70  Resp 16   SpO2 100% Physical Exam  Nursing note and vitals reviewed. Constitutional: She is oriented to person, place, and time. She appears well-developed and well-nourished. No distress.  HENT:  Head: Normocephalic and atraumatic.  Eyes: EOM are normal.  Neck: Normal range of motion.  Cardiovascular: Normal rate, regular rhythm and normal heart sounds.   Pulmonary/Chest: Effort normal and breath sounds normal. No respiratory distress. She has no wheezes. She has no rales. She exhibits no tenderness.  Abdominal: Soft. She exhibits no distension. There is no tenderness.  Musculoskeletal: Normal range of motion. She exhibits no edema and no tenderness.  No calf tenderness or edema   Neurological: She is alert and oriented to person, place, and time.  Skin: Skin is warm and dry.  Psychiatric: She has a normal mood and affect. Judgment normal.    ED Course  Procedures (including critical care time)  DIAGNOSTIC STUDIES: Oxygen Saturation is 100% on RA, normal by my interpretation.    COORDINATION OF CARE: 4:03 PM Discussed treatment plan with pt at bedside and pt agreed to plan.   Labs Review Labs Reviewed  BASIC METABOLIC PANEL - Abnormal; Notable for the following:    Creatinine, Ser 1.24 (*)    GFR calc non Af Amer 43 (*)    GFR calc Af Amer 50 (*)    All other components within normal limits  CBC WITH DIFFERENTIAL  TROPONIN I   Imaging Review Dg Chest 2 View  06/19/2013   CLINICAL DATA:  Chest tightness, shortness of breath.  EXAM: CHEST  2 VIEW  COMPARISON:  10/22/2011  FINDINGS: The heart size and mediastinal contours are within normal limits. Both lungs are clear. The visualized skeletal structures are unremarkable.  IMPRESSION: No active cardiopulmonary disease.   Electronically Signed   By: Rolm Baptise M.D.   On: 06/19/2013 15:03     EKG Interpretation  Date/Time:  Monday June 19 2013 14:29:43 EDT Ventricular Rate:  59 PR Interval:  146 QRS Duration: 72 QT Interval:   422 QTC Calculation: 417 R Axis:   29 Text Interpretation:  Sinus bradycardia with Premature supraventricular  complexes Septal infarct , age undetermined Non-specific ST-t changes  Confirmed by Wilson Singer  MD, Brynleigh Sequeira ([id number]) on 06/19/2013 3:54:07 PM      MDM   Final diagnoses:  Dyspnea  Chest tightness    I personally preformed the services scribed in my presence. The recorded information has been reviewed is accurate. Annette Manifold, MD.   Annette Manifold, MD 06/23/13 [id number]

## 2013-06-19 NOTE — Telephone Encounter (Signed)
Mrs. Cobern called today stating that she feels like she is smothering. C/o Shortness of breath with heaviness to her chest.

## 2013-06-26 ENCOUNTER — Ambulatory Visit (INDEPENDENT_AMBULATORY_CARE_PROVIDER_SITE_OTHER): Payer: Medicare Other | Admitting: Cardiovascular Disease

## 2013-06-26 ENCOUNTER — Encounter: Payer: Self-pay | Admitting: Cardiovascular Disease

## 2013-06-26 VITALS — BP 133/82 | HR 67 | Ht 62.0 in | Wt 217.8 lb

## 2013-06-26 DIAGNOSIS — G4730 Sleep apnea, unspecified: Secondary | ICD-10-CM

## 2013-06-26 DIAGNOSIS — J209 Acute bronchitis, unspecified: Secondary | ICD-10-CM | POA: Diagnosis not present

## 2013-06-26 DIAGNOSIS — I2510 Atherosclerotic heart disease of native coronary artery without angina pectoris: Secondary | ICD-10-CM | POA: Diagnosis not present

## 2013-06-26 DIAGNOSIS — N189 Chronic kidney disease, unspecified: Secondary | ICD-10-CM

## 2013-06-26 DIAGNOSIS — I10 Essential (primary) hypertension: Secondary | ICD-10-CM | POA: Diagnosis not present

## 2013-06-26 DIAGNOSIS — E785 Hyperlipidemia, unspecified: Secondary | ICD-10-CM | POA: Diagnosis not present

## 2013-06-26 DIAGNOSIS — R0609 Other forms of dyspnea: Secondary | ICD-10-CM

## 2013-06-26 DIAGNOSIS — R0989 Other specified symptoms and signs involving the circulatory and respiratory systems: Secondary | ICD-10-CM

## 2013-06-26 MED ORDER — DOXYCYCLINE HYCLATE 100 MG PO CAPS: 100.0000 mg | ORAL_CAPSULE | Freq: Two times a day (BID) | ORAL | Status: DC

## 2013-06-26 NOTE — Patient Instructions (Signed)
   Begin Doxycycline 100mg  twice a day  X 2 weeks  Continue all other medications.   Follow up with Dr. Nadara Mustard after finishing antibiotic above Your physician wants you to follow up in: 6 months.  You will receive a reminder letter in the mail one-two months in advance.  If you don't receive a letter, please call our office to schedule the follow up appointment

## 2013-06-26 NOTE — Progress Notes (Signed)
Patient ID: Annette Sharp, female   DOB: May 29, 1940, 73 y.o.   MRN: [id number]      SUBJECTIVE: Annette Sharp is a 73 year old female with a history of coronary artery disease status post Cypher drug-eluting stent to right coronary artery in June of 2007. She also had a negative ischemia evaluation 06/19/2008 with a Myoview and demonstrated normal LV systolic function. She does have a history of chronic renal insufficiency. She also has HTN, hyperlipidemia, and obstructive sleep apnea, with obesity.  She had been having ongoing chest pain in spite of being started on Imdur and was evaluated by Dr. Dannielle Burn in 05/2012. She then underwent coronary angiography with the following results:  CORONARY STATUS: Moderate non-obstructive ASCAD  OTHER: Cath showed moderate CAD. LAD has a 50% proximal lesion. LCX has  a 30% proximal lesion. RCA has a 50% mid lesion. Stent in RCA is widely  patent.   I started Ranexa in the recent past and this had been controlling her chest pain. She was evaluated in the emergency room approximately one week ago for ongoing chest pain and shortness of breath, as she was directed to do so by her PCP, Dr. Nadara Mustard. ECG was unremarkable as was her chest x-ray. Her blood pressure was mildly elevated at 158/63 mmHg. Oxygen saturation was normal. CBC and troponin were also normal.  She continues to experience a productive cough with sputum that is yellowish to gray colored. She does not believe her symptoms are related to her heart.    Allergies  Allergen Reactions  . Ace Inhibitors Cough  . Gemfibrozil Other (See Comments)    REACTION: Muscle Aches  . Statins     REACTION: Muscle Cramps  . Moxifloxacin Rash    Current Outpatient Prescriptions  Medication Sig Dispense Refill  . acyclovir (ZOVIRAX) 400 MG tablet Take 400 mg by mouth every morning.       Marland Kitchen albuterol (PROVENTIL HFA) 108 (90 BASE) MCG/ACT inhaler Inhale 1 puff into the lungs every 6 (six) hours as needed for  shortness of breath.  1 Inhaler  1  . amLODipine (NORVASC) 10 MG tablet Take 10 mg by mouth every morning.       Marland Kitchen aspirin 81 MG tablet Take 81 mg by mouth every morning.       . cetirizine (ZYRTEC) 10 MG tablet Take 10 mg by mouth every morning.       . citalopram (CELEXA) 20 MG tablet Take 20 mg by mouth every morning.       . clopidogrel (PLAVIX) 75 MG tablet Take 75 mg by mouth every morning.       . cyclobenzaprine (FLEXERIL) 5 MG tablet Take 5 mg by mouth 3 (three) times daily as needed for muscle spasms.      Marland Kitchen levothyroxine (SYNTHROID, LEVOTHROID) 88 MCG tablet Take 88 mcg by mouth every morning.       . nitroGLYCERIN (NITROSTAT) 0.4 MG SL tablet Place 0.4 mg under the tongue every 5 (five) minutes as needed for chest pain.      Marland Kitchen omeprazole (PRILOSEC) 20 MG capsule Take 20 mg by mouth every morning.       . ranolazine (RANEXA) 500 MG 12 hr tablet Take 1 tablet (500 mg total) by mouth 2 (two) times daily.  60 tablet  6  . rosuvastatin (CRESTOR) 5 MG tablet Take 1 tablet (5 mg total) by mouth daily.  30 tablet  6   No current facility-administered medications for this visit.  Past Medical History  Diagnosis Date  . Overweight     OBESITY  . Hyperlipidemia   . Other and unspecified angina pectoris     ANGINA  . Hypertension   . Back pain   . Peptic ulcer disease   . Thyroid disease     TREATED HYPOTHYROIDISM  . Sleep apnea     SEVERE OBSTRUCTIVE  . 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 Stroke   . Depression   . Chronic renal insufficiency     GFR 45 ML'S PER MINUTE  . Coronary artery disease     S/P CYPHER DRUG-ELUTING STENTS TO RIGHT CORONARY ARTERY JUNE 2007  . Coronary atherosclerosis of native coronary artery     NEGATIVE LEXISCAN FOR ISCHEMIA MARCH 23,2010 NORMAL LV FUNCTTION    Past Surgical History  Procedure Laterality Date  . Carpal tunnel release    . Cholecystectomy    . Total abdominal hysterectomy w/  bilateral salpingoophorectomy    . Replacement total knee    . Rotator cuff repair      History   Social History  . Marital Status: Married    Spouse Name: N/A    Number of Children: N/A  . Years of Education: N/A   Occupational History  . Not on file.   Social History Main Topics  . Smoking status: Never Smoker   . Smokeless tobacco: Never Used  . Alcohol Use: No  . Drug Use: No  . Sexual Activity: Not on file   Other Topics Concern  . Not on file   Social History Narrative  . No narrative on file     Filed Vitals:   06/26/13 1111  BP: 133/82  Pulse: 67  Height: 5\' 2"  (1.575 m)  Weight: 217 lb 12.8 oz (98.793 kg)  SpO2: 98%    PHYSICAL EXAM General: NAD Neck: No JVD, no thyromegaly. Lungs: Clear to auscultation bilaterally with normal respiratory effort. CV: Nondisplaced PMI.  Regular rate and rhythm, normal S1/S2, no S3/S4, no murmur. No pretibial or periankle edema.  No carotid bruit.  Normal pedal pulses.  Abdomen: Soft, nontender, no hepatosplenomegaly, no distention.  Neurologic: Alert and oriented x 3.  Psych: Normal affect. Extremities: No clubbing or cyanosis.   ECG: reviewed and available in electronic records.      ASSESSMENT AND PLAN: 1. CAD: continues to have moderate CAD, which will be medically managed. Continue ASA and Plavix. I previously deeemed a nitrate-free interval was necessary, and started Ranexa 500 mg bid. She denies muscle cramping since starting Crestor 5 mg daily, but has not tolerated other statins in the past. She may have had symptoms from microvascular ischemia, which are currently well controlled.  2. HTN: well controlled today. 3. Dyspnea on exertion: improved symptomatically, but does have a productive cough and has had it for 2 weeks, which appears to be consistent with acute bronchitis. As her PFT's showed possible early emphysema, I prescribed an albuterol inhaler prn and this appears to have helped alleviate her  symptoms.  4. Hyperlipidemia: Crestor as above. Lipids apparently checked by Dr. Nadara Mustard recently. I will try and obtain these results.  5. Sleep apnea: encouraged CPAP use.  6. Acute bronchitis: I will prescribe doxycycline 100 mg bid x 14 days, and have asked her to f/u with Dr. Nadara Mustard afterwards.  Dispo: f/u 6 months.    Kate Sable, M.D., F.A.C.C.

## 2013-07-25 DIAGNOSIS — E039 Hypothyroidism, unspecified: Secondary | ICD-10-CM | POA: Diagnosis not present

## 2013-07-25 DIAGNOSIS — I10 Essential (primary) hypertension: Secondary | ICD-10-CM | POA: Diagnosis not present

## 2013-07-25 DIAGNOSIS — E7800 Pure hypercholesterolemia, unspecified: Secondary | ICD-10-CM | POA: Diagnosis not present

## 2013-07-25 DIAGNOSIS — K219 Gastro-esophageal reflux disease without esophagitis: Secondary | ICD-10-CM | POA: Diagnosis not present

## 2013-08-01 DIAGNOSIS — E7800 Pure hypercholesterolemia, unspecified: Secondary | ICD-10-CM | POA: Diagnosis not present

## 2013-08-01 DIAGNOSIS — E538 Deficiency of other specified B group vitamins: Secondary | ICD-10-CM | POA: Diagnosis not present

## 2013-08-01 DIAGNOSIS — Z23 Encounter for immunization: Secondary | ICD-10-CM | POA: Diagnosis not present

## 2013-08-01 DIAGNOSIS — I209 Angina pectoris, unspecified: Secondary | ICD-10-CM | POA: Diagnosis not present

## 2013-08-01 DIAGNOSIS — E039 Hypothyroidism, unspecified: Secondary | ICD-10-CM | POA: Diagnosis not present

## 2013-08-30 DIAGNOSIS — G4733 Obstructive sleep apnea (adult) (pediatric): Secondary | ICD-10-CM | POA: Diagnosis not present

## 2013-09-05 DIAGNOSIS — Z6841 Body Mass Index (BMI) 40.0 and over, adult: Secondary | ICD-10-CM | POA: Diagnosis not present

## 2013-09-05 DIAGNOSIS — G4733 Obstructive sleep apnea (adult) (pediatric): Secondary | ICD-10-CM | POA: Diagnosis not present

## 2013-09-10 DIAGNOSIS — G4733 Obstructive sleep apnea (adult) (pediatric): Secondary | ICD-10-CM | POA: Diagnosis not present

## 2013-10-23 ENCOUNTER — Other Ambulatory Visit: Payer: Self-pay | Admitting: *Deleted

## 2013-10-23 MED ORDER — RANOLAZINE ER 500 MG PO TB12: 500.0000 mg | ORAL_TABLET | Freq: Two times a day (BID) | ORAL | Status: DC

## 2013-10-28 DIAGNOSIS — Z9861 Coronary angioplasty status: Secondary | ICD-10-CM | POA: Diagnosis not present

## 2013-10-28 DIAGNOSIS — M79609 Pain in unspecified limb: Secondary | ICD-10-CM | POA: Diagnosis not present

## 2013-10-28 DIAGNOSIS — M6790 Unspecified disorder of synovium and tendon, unspecified site: Secondary | ICD-10-CM | POA: Diagnosis not present

## 2013-10-28 DIAGNOSIS — Z79899 Other long term (current) drug therapy: Secondary | ICD-10-CM | POA: Diagnosis not present

## 2013-10-28 DIAGNOSIS — R079 Chest pain, unspecified: Secondary | ICD-10-CM | POA: Diagnosis not present

## 2013-10-28 DIAGNOSIS — M310 Hypersensitivity angiitis: Secondary | ICD-10-CM | POA: Diagnosis not present

## 2013-10-28 DIAGNOSIS — IMO0002 Reserved for concepts with insufficient information to code with codable children: Secondary | ICD-10-CM | POA: Diagnosis not present

## 2013-10-28 DIAGNOSIS — Z8673 Personal history of transient ischemic attack (TIA), and cerebral infarction without residual deficits: Secondary | ICD-10-CM | POA: Diagnosis not present

## 2013-10-28 DIAGNOSIS — Z7982 Long term (current) use of aspirin: Secondary | ICD-10-CM | POA: Diagnosis not present

## 2013-10-28 DIAGNOSIS — I776 Arteritis, unspecified: Secondary | ICD-10-CM | POA: Diagnosis not present

## 2013-10-28 DIAGNOSIS — I10 Essential (primary) hypertension: Secondary | ICD-10-CM | POA: Diagnosis not present

## 2013-10-28 DIAGNOSIS — E7800 Pure hypercholesterolemia, unspecified: Secondary | ICD-10-CM | POA: Diagnosis not present

## 2013-10-29 DIAGNOSIS — R079 Chest pain, unspecified: Secondary | ICD-10-CM | POA: Diagnosis not present

## 2013-11-08 DIAGNOSIS — G4733 Obstructive sleep apnea (adult) (pediatric): Secondary | ICD-10-CM | POA: Diagnosis not present

## 2013-11-10 ENCOUNTER — Encounter: Payer: Self-pay | Admitting: Cardiovascular Disease

## 2013-11-10 ENCOUNTER — Ambulatory Visit (INDEPENDENT_AMBULATORY_CARE_PROVIDER_SITE_OTHER): Payer: Medicare Other | Admitting: Cardiovascular Disease

## 2013-11-10 VITALS — BP 157/81 | HR 62 | Ht 62.0 in | Wt 213.0 lb

## 2013-11-10 DIAGNOSIS — G4730 Sleep apnea, unspecified: Secondary | ICD-10-CM

## 2013-11-10 DIAGNOSIS — Z79899 Other long term (current) drug therapy: Secondary | ICD-10-CM

## 2013-11-10 DIAGNOSIS — E785 Hyperlipidemia, unspecified: Secondary | ICD-10-CM

## 2013-11-10 DIAGNOSIS — I2510 Atherosclerotic heart disease of native coronary artery without angina pectoris: Secondary | ICD-10-CM

## 2013-11-10 DIAGNOSIS — J438 Other emphysema: Secondary | ICD-10-CM

## 2013-11-10 DIAGNOSIS — N183 Chronic kidney disease, stage 3 unspecified: Secondary | ICD-10-CM

## 2013-11-10 DIAGNOSIS — I209 Angina pectoris, unspecified: Secondary | ICD-10-CM | POA: Diagnosis not present

## 2013-11-10 DIAGNOSIS — I25118 Atherosclerotic heart disease of native coronary artery with other forms of angina pectoris: Secondary | ICD-10-CM

## 2013-11-10 DIAGNOSIS — R079 Chest pain, unspecified: Secondary | ICD-10-CM | POA: Diagnosis not present

## 2013-11-10 DIAGNOSIS — I10 Essential (primary) hypertension: Secondary | ICD-10-CM | POA: Diagnosis not present

## 2013-11-10 MED ORDER — CHLORTHALIDONE 25 MG PO TABS: 25.0000 mg | ORAL_TABLET | Freq: Every day | ORAL | Status: DC

## 2013-11-10 MED ORDER — ISOSORBIDE MONONITRATE ER 30 MG PO TB24: 30.0000 mg | ORAL_TABLET | Freq: Every day | ORAL | Status: DC

## 2013-11-10 MED ORDER — ROSUVASTATIN CALCIUM 10 MG PO TABS: 10.0000 mg | ORAL_TABLET | Freq: Every day | ORAL | Status: DC

## 2013-11-10 MED ORDER — CHLORTHALIDONE 25 MG PO TABS: 12.5000 mg | ORAL_TABLET | Freq: Every day | ORAL | Status: DC

## 2013-11-10 NOTE — Progress Notes (Signed)
Patient ID: Annette Sharp, female   DOB: 1940-04-12, 73 y.o.   MRN: [id number]      SUBJECTIVE: Mrs. Peterka is a 73 year old female with a history of coronary artery disease status post Cypher drug-eluting stent to right coronary artery in June of 2007. She also had a negative ischemia evaluation 06/19/2008 with a nuclear MPI and demonstrated normal LV systolic function. She does have a history of chronic renal insufficiency. She also has essential HTN, hyperlipidemia, and obstructive sleep apnea with obesity.  She had been having ongoing chest pain in spite of being started on Imdur and was evaluated by Dr. Dannielle Burn in 05/2012. She then underwent coronary angiography with the following results:  CORONARY STATUS: Moderate non-obstructive ASCAD  OTHER: Cath showed moderate CAD. LAD has a 50% proximal lesion. LCX has  a 30% proximal lesion. RCA has a 50% mid lesion. Stent in RCA is widely  patent.   I previously started Ranexa and this had been controlling her chest pain. However, she developed nightmares with this and reduced the dose on her own to 500 mg daily. She experiences chest pressure with exertion with activities like walking to take her garbage out which is approximately 30 feet. She then goes inside the house and lies down to rest until the chest pressure resolves. She denies orthopnea and paroxysmal nocturnal dyspnea. She does get short of breath only when lying on her left side. She denies palpitations and leg swelling.    Review of Systems: As per "subjective", otherwise negative.  Allergies  Allergen Reactions  . Ace Inhibitors Cough  . Gemfibrozil Other (See Comments)    REACTION: Muscle Aches  . Statins     REACTION: Muscle Cramps  . Moxifloxacin Rash    Current Outpatient Prescriptions  Medication Sig Dispense Refill  . acyclovir (ZOVIRAX) 400 MG tablet Take 400 mg by mouth every morning.       Marland Kitchen albuterol (PROVENTIL HFA) 108 (90 BASE) MCG/ACT inhaler Inhale 1 puff into the  lungs every 6 (six) hours as needed for shortness of breath.  1 Inhaler  1  . amLODipine (NORVASC) 10 MG tablet Take 10 mg by mouth every morning.       Marland Kitchen aspirin 81 MG tablet Take 81 mg by mouth every morning.       . cetirizine (ZYRTEC) 10 MG tablet Take 10 mg by mouth every morning.       . citalopram (CELEXA) 20 MG tablet Take 20 mg by mouth every morning.       . clopidogrel (PLAVIX) 75 MG tablet Take 75 mg by mouth every morning.       . cyclobenzaprine (FLEXERIL) 5 MG tablet Take 5 mg by mouth 3 (three) times daily as needed for muscle spasms.      Marland Kitchen doxycycline (VIBRAMYCIN) 100 MG capsule Take 1 capsule (100 mg total) by mouth 2 (two) times daily.  28 capsule  0  . levothyroxine (SYNTHROID, LEVOTHROID) 88 MCG tablet Take 88 mcg by mouth every morning.       . nitroGLYCERIN (NITROSTAT) 0.4 MG SL tablet Place 0.4 mg under the tongue every 5 (five) minutes as needed for chest pain.      Marland Kitchen omeprazole (PRILOSEC) 20 MG capsule Take 20 mg by mouth every morning.       . ranolazine (RANEXA) 500 MG 12 hr tablet Take 1 tablet (500 mg total) by mouth 2 (two) times daily.  60 tablet  6  . rosuvastatin (CRESTOR) 5 MG  tablet Take 1 tablet (5 mg total) by mouth daily.  30 tablet  6   No current facility-administered medications for this visit.    Past Medical History  Diagnosis Date  . Overweight     OBESITY  . Hyperlipidemia   . Other and unspecified angina pectoris     ANGINA  . Hypertension   . Back pain   . Peptic ulcer disease   . Thyroid disease     TREATED HYPOTHYROIDISM  . Sleep apnea     SEVERE OBSTRUCTIVE  . 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 Stroke   . Depression   . Chronic renal insufficiency     GFR 45 ML'S PER MINUTE  . Coronary artery disease     S/P CYPHER DRUG-ELUTING STENTS TO RIGHT CORONARY ARTERY JUNE 2007  . Coronary atherosclerosis of native coronary artery     NEGATIVE LEXISCAN FOR ISCHEMIA MARCH 23,2010 NORMAL  LV FUNCTTION    Past Surgical History  Procedure Laterality Date  . Carpal tunnel release    . Cholecystectomy    . Total abdominal hysterectomy w/ bilateral salpingoophorectomy    . Replacement total knee    . Rotator cuff repair      History   Social History  . Marital Status: Married    Spouse Name: N/A    Number of Children: N/A  . Years of Education: N/A   Occupational History  . Not on file.   Social History Main Topics  . Smoking status: Never Smoker   . Smokeless tobacco: Never Used  . Alcohol Use: No  . Drug Use: No  . Sexual Activity: Not on file   Other Topics Concern  . Not on file   Social History Narrative  . No narrative on file    BP 157/81  Pulse 62  Wt 213 lbs  PHYSICAL EXAM General: NAD Neck: No JVD, no thyromegaly. Lungs: Clear to auscultation bilaterally with normal respiratory effort. CV: Nondisplaced PMI.  Regular rate and rhythm, normal S1/S2, no S3/S4, no murmur. No pretibial or periankle edema.  No carotid bruit.  Normal pedal pulses.  Abdomen: Soft, nontender, no hepatosplenomegaly, no distention.  Neurologic: Alert and oriented x 3.  Psych: Normal affect. Extremities: No clubbing or cyanosis.   ECG: reviewed and available in electronic records.      ASSESSMENT AND PLAN: 1. CAD with chronic stable angina: Continues to have moderate CAD, which will be medically managed. Continue ASA and Plavix. I previously deemed a nitrate-free interval was necessary, and started Ranexa 500 mg bid. However, due to nightmares, she reduced the dose to 500 mg daily and her nightmares abated. She remains symptomatic with chronic stable angina. I will d/c Ranexa and reinitiate Imdur 30 mg daily. She may have symptoms from microvascular ischemia. She denies muscle cramping since starting Crestor 5 mg daily, but has not tolerated other statins in the past.    2. Essential HTN: Uncontrolled today. She has problems with frequent urination, and also thinks  she may forget to take a medicine tid (i.e. Hydralazine). I will start low-dose chlorthalidone 12.5 mg daily and check a BMET in 10-14 days. I am unable to use ACEI/ARB due to reduced renal function (creatinine 1.43 in 06/2013 and GFR 37 ml/min).  3. COPD: PRN use of albuterol inhaler.  4. Hyperlipidemia: Increase Crestor to 10 mg daily. Lipids in 06/2013-HDL 73, LDL 87. Repeat lipids and LFT's in 3 months.  5.  Sleep apnea: Using CPAP daily.   6. CKD stage 3: Check BMET in 10-14 days given initiation of diuretic.  Dispo: f/u 3 months.  Kate Sable, M.D., F.A.C.C.

## 2013-11-10 NOTE — Patient Instructions (Addendum)
Your physician recommends that you schedule a follow-up appointment in: 3 month. Your physician has recommended you make the following change in your medication:  Stop ranexa. Start imdur 30 mg daily. Start chlorthalidone 12.5 mg daily. Increase your crestor to 10 mg daily. You may take (2) of your 5 mg tablets daily until they are finished. Continue all other medications the same. Your physician recommends that you have lab work in 10-14 days to check your BMET. Your physician recommends that you have a FASTING lipid/liver profile in 3 months just before your next visit.

## 2013-11-13 ENCOUNTER — Telehealth: Payer: Self-pay | Admitting: Cardiovascular Disease

## 2013-11-13 DIAGNOSIS — I10 Essential (primary) hypertension: Secondary | ICD-10-CM

## 2013-11-13 DIAGNOSIS — R252 Cramp and spasm: Secondary | ICD-10-CM

## 2013-11-13 NOTE — Telephone Encounter (Signed)
Check serum magnesium level along with potassium.

## 2013-11-13 NOTE — Telephone Encounter (Signed)
Has question about her new medication.  She has been cramping since she has started.  There is a discrepancy about dosage she is suppose to take.   chlorthalidone (HYGROTON) 25 MG

## 2013-11-13 NOTE — Telephone Encounter (Signed)
Patient stated she took whole tab x 1 day, took nothing so far today.  Stated she was cramping so bad she was not sure about taking.  Informed her that she should be on the 12.5mg  Chlorthalidone daily (1/2 tab 25mg ).  Advised her to go ahead with labs this Friday.  Any other suggestions in regards to the cramping.

## 2013-11-17 DIAGNOSIS — I10 Essential (primary) hypertension: Secondary | ICD-10-CM | POA: Diagnosis not present

## 2013-11-17 DIAGNOSIS — R252 Cramp and spasm: Secondary | ICD-10-CM | POA: Diagnosis not present

## 2013-11-17 NOTE — Telephone Encounter (Signed)
Patient notified via voicemail.

## 2013-11-23 ENCOUNTER — Telehealth: Payer: Self-pay | Admitting: *Deleted

## 2013-11-23 DIAGNOSIS — I10 Essential (primary) hypertension: Secondary | ICD-10-CM

## 2013-11-23 DIAGNOSIS — E876 Hypokalemia: Secondary | ICD-10-CM

## 2013-11-23 MED ORDER — MAGNESIUM OXIDE -MG SUPPLEMENT 400 (240 MG) MG PO TABS
ORAL_TABLET | ORAL | Status: DC
Start: 2013-11-23 — End: 2014-02-09

## 2013-11-23 MED ORDER — POTASSIUM CHLORIDE ER 10 MEQ PO TBCR: EXTENDED_RELEASE_TABLET | ORAL | Status: DC

## 2013-11-23 NOTE — Telephone Encounter (Signed)
Notes Recorded by Laurine Blazer, LPN on 1/44/8185 at 63:14 AM Patient notified. Will send new RX to [facility] [place]. Will also fax lab order to [facility]. She will go around 12/04/2013.

## 2013-11-23 NOTE — Telephone Encounter (Signed)
Message copied by Laurine Blazer on Thu Nov 23, 2013 11:30 AM ------      Message from: Kate Sable A      Created: Wed Nov 22, 2013 10:21 PM       Start KCl 20 meq daily x 3 days, followed by 10 meq daily. Give Mg oxide 400 mg daily x 5 days. Repeat BMET and serum magnesium in 1 week. ------

## 2013-11-28 DIAGNOSIS — I10 Essential (primary) hypertension: Secondary | ICD-10-CM | POA: Diagnosis not present

## 2013-11-28 DIAGNOSIS — E876 Hypokalemia: Secondary | ICD-10-CM | POA: Diagnosis not present

## 2013-11-30 ENCOUNTER — Telehealth: Payer: Self-pay | Admitting: *Deleted

## 2013-11-30 NOTE — Telephone Encounter (Signed)
Message copied by Laurine Blazer on Thu Nov 30, 2013 10:38 AM ------      Message from: Kate Sable A      Created: Wed Nov 29, 2013  5:38 PM       Stable. Low normal magnesium. Normal K. ------

## 2013-11-30 NOTE — Telephone Encounter (Signed)
Notes Recorded by Laurine Blazer, LPN on 07/01/5144 at 04:79 AM Patient notified.

## 2013-12-30 DIAGNOSIS — Z23 Encounter for immunization: Secondary | ICD-10-CM | POA: Diagnosis not present

## 2014-02-01 DIAGNOSIS — D519 Vitamin B12 deficiency anemia, unspecified: Secondary | ICD-10-CM | POA: Diagnosis not present

## 2014-02-01 DIAGNOSIS — K219 Gastro-esophageal reflux disease without esophagitis: Secondary | ICD-10-CM | POA: Diagnosis not present

## 2014-02-01 DIAGNOSIS — I10 Essential (primary) hypertension: Secondary | ICD-10-CM | POA: Diagnosis not present

## 2014-02-01 DIAGNOSIS — I259 Chronic ischemic heart disease, unspecified: Secondary | ICD-10-CM | POA: Diagnosis not present

## 2014-02-01 DIAGNOSIS — E039 Hypothyroidism, unspecified: Secondary | ICD-10-CM | POA: Diagnosis not present

## 2014-02-05 DIAGNOSIS — E782 Mixed hyperlipidemia: Secondary | ICD-10-CM | POA: Diagnosis not present

## 2014-02-05 DIAGNOSIS — Z79899 Other long term (current) drug therapy: Secondary | ICD-10-CM | POA: Diagnosis not present

## 2014-02-08 ENCOUNTER — Telehealth: Payer: Self-pay | Admitting: *Deleted

## 2014-02-08 DIAGNOSIS — I209 Angina pectoris, unspecified: Secondary | ICD-10-CM | POA: Diagnosis not present

## 2014-02-08 DIAGNOSIS — R05 Cough: Secondary | ICD-10-CM | POA: Diagnosis not present

## 2014-02-08 DIAGNOSIS — J449 Chronic obstructive pulmonary disease, unspecified: Secondary | ICD-10-CM | POA: Diagnosis not present

## 2014-02-08 DIAGNOSIS — E039 Hypothyroidism, unspecified: Secondary | ICD-10-CM | POA: Diagnosis not present

## 2014-02-08 DIAGNOSIS — I10 Essential (primary) hypertension: Secondary | ICD-10-CM | POA: Diagnosis not present

## 2014-02-08 NOTE — Telephone Encounter (Signed)
Notes Recorded by Laurine Blazer, LPN on 35/24/8185 at 9:48 AM Patient notified. Already has f/u scheduled for tomorrow with Dr. Bronson Ing. Stated that she has not taken her Crestor in about a month or so due to cost. She will discuss further at [place] tomorrow.

## 2014-02-08 NOTE — Telephone Encounter (Signed)
-----   Message from Herminio Commons, MD sent at 02/06/2014 12:17 PM EST ----- Markedly elevated LDL and total cholesterol. Need to increase Crestor to 20 mg daily with repeat lipids in 2 months. If she is unable to tolerate this, she would be a candidate for PCSK-9 inhibitor.

## 2014-02-09 ENCOUNTER — Encounter: Payer: Self-pay | Admitting: *Deleted

## 2014-02-09 ENCOUNTER — Ambulatory Visit (INDEPENDENT_AMBULATORY_CARE_PROVIDER_SITE_OTHER): Payer: Medicare Other | Admitting: Cardiovascular Disease

## 2014-02-09 ENCOUNTER — Encounter: Payer: Self-pay | Admitting: Cardiovascular Disease

## 2014-02-09 VITALS — BP 121/72 | HR 66 | Ht 62.5 in | Wt 206.0 lb

## 2014-02-09 DIAGNOSIS — I25118 Atherosclerotic heart disease of native coronary artery with other forms of angina pectoris: Secondary | ICD-10-CM

## 2014-02-09 DIAGNOSIS — G4730 Sleep apnea, unspecified: Secondary | ICD-10-CM | POA: Diagnosis not present

## 2014-02-09 DIAGNOSIS — J209 Acute bronchitis, unspecified: Secondary | ICD-10-CM | POA: Diagnosis not present

## 2014-02-09 DIAGNOSIS — R5383 Other fatigue: Secondary | ICD-10-CM | POA: Diagnosis not present

## 2014-02-09 DIAGNOSIS — N183 Chronic kidney disease, stage 3 unspecified: Secondary | ICD-10-CM

## 2014-02-09 DIAGNOSIS — R079 Chest pain, unspecified: Secondary | ICD-10-CM | POA: Diagnosis not present

## 2014-02-09 DIAGNOSIS — I209 Angina pectoris, unspecified: Secondary | ICD-10-CM

## 2014-02-09 DIAGNOSIS — R0609 Other forms of dyspnea: Secondary | ICD-10-CM

## 2014-02-09 DIAGNOSIS — I2510 Atherosclerotic heart disease of native coronary artery without angina pectoris: Secondary | ICD-10-CM | POA: Diagnosis not present

## 2014-02-09 DIAGNOSIS — J438 Other emphysema: Secondary | ICD-10-CM

## 2014-02-09 MED ORDER — ROSUVASTATIN CALCIUM 10 MG PO TABS: 10.0000 mg | ORAL_TABLET | Freq: Every day | ORAL | Status: DC

## 2014-02-09 NOTE — Progress Notes (Signed)
Patient ID: Annette Sharp, female   DOB: Feb 02, 1941, 73 y.o.   MRN: [id number]      SUBJECTIVE: [name] is a 73 year old female with a history of coronary artery disease status post Cypher drug-eluting stent to right coronary artery in June of 2007. She also had a negative ischemia evaluation 06/19/2008 with a nuclear MPI and demonstrated normal LV systolic function. She does have a history of chronic renal insufficiency. She also has essential HTN, hyperlipidemia, and obstructive sleep apnea with obesity.  She had been having ongoing chest pain in spite of being started on Imdur and was evaluated by Dr. Dannielle Burn in 05/2012. She then underwent coronary angiography with the following results:  CORONARY STATUS: Moderate non-obstructive ASCAD  OTHER: Cath showed moderate CAD. LAD has a 50% proximal lesion. LCX has  a 30% proximal lesion. RCA has a 50% mid lesion. Stent in RCA is widely  patent.   She continues to feel fatigued and does not feel like shopping. The fatigue has progressively been getting worse. She has been battling a different type of chest pressure and a cough and feels that she has bronchitis. She saw Dr. Nadara Mustard who ordered a chest x-ray and he told her he did not see any significant cardiopulmonary disease. He prescribed an antibiotic which she has yet to start taking. She experiences chest pressure with exertion with activities like walking to take her garbage out which is approximately 30 feet. She then goes inside the house and lies down to rest until the chest pressure resolves. She denies orthopnea and paroxysmal nocturnal dyspnea. She denies palpitations and leg swelling.  Recent lipids on 02/05/2014 demonstrated total cholesterol 263, HDL 53, LDL 192, triglycerides 88. AST 23, ALT 19.  On 11/22/2013, BUN 16, creatinine 1.3, GFR 40 mL/min, potassium 3.1, sodium 132, magnesium 1.8.    Review of Systems: As per "subjective", otherwise negative.  Allergies  Allergen  Reactions  . Ace Inhibitors Cough  . Gemfibrozil Other (See Comments)    REACTION: Muscle Aches  . Statins     REACTION: Muscle Cramps  . Moxifloxacin Rash  . Ranexa [Ranolazine] Other (See Comments)    "NIGHTMARES"    Current Outpatient Prescriptions  Medication Sig Dispense Refill  . acyclovir (ZOVIRAX) 400 MG tablet Take 400 mg by mouth every morning.     Marland Kitchen albuterol (PROVENTIL HFA) 108 (90 BASE) MCG/ACT inhaler Inhale 1 puff into the lungs every 6 (six) hours as needed for shortness of breath. 1 Inhaler 1  . amLODipine (NORVASC) 10 MG tablet Take 10 mg by mouth every morning.     Marland Kitchen aspirin 81 MG tablet Take 81 mg by mouth every morning.     . chlorthalidone (HYGROTON) 25 MG tablet Take 0.5 tablets (12.5 mg total) by mouth daily. 45 tablet 3  . citalopram (CELEXA) 20 MG tablet Take 20 mg by mouth every morning.     . clopidogrel (PLAVIX) 75 MG tablet Take 75 mg by mouth every morning.     . clotrimazole (LOTRIMIN) 1 % cream Apply 1 application topically as needed.    . cyclobenzaprine (FLEXERIL) 5 MG tablet Take 5 mg by mouth 3 (three) times daily as needed for muscle spasms.    . isosorbide mononitrate (IMDUR) 30 MG 24 hr tablet Take 1 tablet (30 mg total) by mouth daily. 90 tablet 3  . levothyroxine (SYNTHROID, LEVOTHROID) 88 MCG tablet Take 88 mcg by mouth every morning.     Marland Kitchen omeprazole (PRILOSEC) 20 MG capsule Take  20 mg by mouth every morning.     . potassium chloride (K-DUR) 10 MEQ tablet Take 2 tabs (33meq) daily x 3 days, then 1 tab (35meq) daily thereafter 30 tablet 6   No current facility-administered medications for this visit.    Past Medical History  Diagnosis Date  . Overweight(278.02)     OBESITY  . Hyperlipidemia   . Other and unspecified angina pectoris     ANGINA  . Hypertension   . Back pain   . Peptic ulcer disease   . Thyroid disease     TREATED HYPOTHYROIDISM  . Sleep apnea     SEVERE OBSTRUCTIVE  . Cerebrovascular disease     NONOBSTRUCTIVE CVA  BY CAROTID DOPPLERS OCTOBER 2008  . Stroke   . Depression   . Chronic renal insufficiency     GFR 45 ML'S PER MINUTE  . Coronary artery disease     S/P CYPHER DRUG-ELUTING STENTS TO RIGHT CORONARY ARTERY JUNE 2007  . Coronary atherosclerosis of native coronary artery     NEGATIVE LEXISCAN FOR ISCHEMIA MARCH 23,2010 NORMAL LV FUNCTTION    Past Surgical History  Procedure Laterality Date  . Carpal tunnel release    . Cholecystectomy    . Total abdominal hysterectomy w/ bilateral salpingoophorectomy    . Replacement total knee    . Rotator cuff repair      History   Social History  . Marital Status: Married    Spouse Name: N/A    Number of Children: N/A  . Years of Education: N/A   Occupational History  . Not on file.   Social History Main Topics  . Smoking status: Never Smoker   . Smokeless tobacco: Never Used  . Alcohol Use: No  . Drug Use: No  . Sexual Activity: Not on file   Other Topics Concern  . Not on file   Social History Narrative     Filed Vitals:   02/09/14 1352  BP: 121/72  Pulse: 66  Height: 5' 2.5" (1.588 m)  Weight: 206 lb (93.441 kg)  SpO2: 96%    PHYSICAL EXAM General: NAD HEENT: Normal. Neck: No JVD, no thyromegaly. Lungs: Clear to auscultation bilaterally with normal respiratory effort. CV: Nondisplaced PMI.  Regular rate and rhythm, normal S1/S2, no S3/S4, no murmur. No pretibial or periankle edema.  No carotid bruit.  Normal pedal pulses.  Abdomen: Soft, nontender, no hepatosplenomegaly, no distention.  Neurologic: Alert and oriented x 3.  Psych: Normal affect. Skin: Normal. Musculoskeletal: Normal range of motion, no gross deformities. Extremities: No clubbing or cyanosis.   ECG: Most recent ECG reviewed.      ASSESSMENT AND PLAN: 1. CAD with chronic stable angina but progressive fatigue: I will order a Lexiscan Cardiolite as she has been on/off of statins on several occasions, and her LDL and cholesterol are markedly  elevated. Thus, I want to be certain her moderate disease has not progressed to the point of becoming hemodynamically significant. Continue ASA and Plavix. Continue Imdur 30 mg. She may have symptoms from microvascular ischemia. She did not refill her Crestor as she forgot. This will be restarted today.  2. Essential HTN: Well controlled today. Continue chlorthalidone. I am unable to use ACEI/ARB due to stage 3 CKD.  3. COPD: PRN use of albuterol inhaler.  4. Hyperlipidemia: Markedly elevated, as pt stopped Crestor. She will start 10 mg today. Will recheck lipids on 3 months.  5. Sleep apnea: Using CPAP daily.   6. CKD stage  3: Results as noted above. No changes to chlorthalidone use.  Dispo: f/u 6 weeks.   Kate Sable, M.D., F.A.C.C.

## 2014-02-09 NOTE — Patient Instructions (Signed)
   Continue Crestor 10mg  daily - added to list today Continue all other medications.   Your physician has requested that you have a lexiscan myoview. For further information please visit [email or web address]. Please follow instruction sheet, as given. Office will contact with results via phone or letter.   Labs for Lipids - due in 3 months - will send reminder in mail  Follow up in  6 weeks

## 2014-02-27 ENCOUNTER — Encounter (HOSPITAL_COMMUNITY)
Admission: RE | Admit: 2014-02-27 | Discharge: 2014-02-27 | Disposition: A | Discharge status: Home / Self Care | Payer: Medicare Other | Source: Ambulatory Visit | Attending: Cardiovascular Disease | Admitting: Cardiovascular Disease

## 2014-02-27 ENCOUNTER — Ambulatory Visit (HOSPITAL_COMMUNITY)
Admission: RE | Admit: 2014-02-27 | Discharge: 2014-02-27 | Disposition: A | Discharge status: Home / Self Care | Payer: Medicare Other | Source: Ambulatory Visit | Attending: Cardiovascular Disease | Admitting: Cardiovascular Disease

## 2014-02-27 ENCOUNTER — Encounter (HOSPITAL_COMMUNITY): Payer: Self-pay

## 2014-02-27 DIAGNOSIS — I10 Essential (primary) hypertension: Secondary | ICD-10-CM | POA: Diagnosis not present

## 2014-02-27 DIAGNOSIS — E785 Hyperlipidemia, unspecified: Secondary | ICD-10-CM | POA: Diagnosis not present

## 2014-02-27 DIAGNOSIS — I25119 Atherosclerotic heart disease of native coronary artery with unspecified angina pectoris: Secondary | ICD-10-CM | POA: Diagnosis not present

## 2014-02-27 DIAGNOSIS — I2510 Atherosclerotic heart disease of native coronary artery without angina pectoris: Secondary | ICD-10-CM | POA: Insufficient documentation

## 2014-02-27 DIAGNOSIS — I25118 Atherosclerotic heart disease of native coronary artery with other forms of angina pectoris: Secondary | ICD-10-CM

## 2014-02-27 DIAGNOSIS — G4733 Obstructive sleep apnea (adult) (pediatric): Secondary | ICD-10-CM | POA: Diagnosis not present

## 2014-02-27 DIAGNOSIS — R5383 Other fatigue: Secondary | ICD-10-CM

## 2014-02-27 DIAGNOSIS — R0789 Other chest pain: Secondary | ICD-10-CM | POA: Diagnosis not present

## 2014-02-27 DIAGNOSIS — R079 Chest pain, unspecified: Secondary | ICD-10-CM

## 2014-02-27 MED ORDER — TECHNETIUM TC 99M SESTAMIBI - CARDIOLITE
30.0000 | Freq: Once | INTRAVENOUS | Status: AC | PRN
  Administered 2014-02-27: 11:00:00 30 via INTRAVENOUS

## 2014-02-27 MED ORDER — SODIUM CHLORIDE 0.9 % IJ SOLN
10.0000 mL | INTRAMUSCULAR | Status: DC | PRN
  Administered 2014-02-27: 10 mL via INTRAVENOUS
  Filled 2014-02-27: qty 10

## 2014-02-27 MED ORDER — TECHNETIUM TC 99M SESTAMIBI GENERIC - CARDIOLITE
10.0000 | Freq: Once | INTRAVENOUS | Status: AC | PRN
  Administered 2014-02-27: 10 via INTRAVENOUS

## 2014-02-27 MED ORDER — REGADENOSON 0.4 MG/5ML IV SOLN
0.4000 mg | Freq: Once | INTRAVENOUS | Status: AC | PRN
  Administered 2014-02-27: 0.4 mg via INTRAVENOUS

## 2014-02-27 MED ORDER — SODIUM CHLORIDE 0.9 % IJ SOLN
INTRAMUSCULAR | Status: AC
  Administered 2014-02-27: 10 mL via INTRAVENOUS
  Filled 2014-02-27: qty 10

## 2014-02-27 MED ORDER — REGADENOSON 0.4 MG/5ML IV SOLN
INTRAVENOUS | Status: AC
  Administered 2014-02-27: 0.4 mg via INTRAVENOUS
  Filled 2014-02-27: qty 5

## 2014-02-27 NOTE — Progress Notes (Signed)
Stress Lab Nurses Notes - Annette Sharp  Annette Sharp 02/27/2014 Reason for doing test: CAD and angina , fatigue Type of test: Wille Glaser Nurse performing test: Gerrit Halls, RN Nuclear Medicine Tech: Redmond Baseman Echo Tech: Not Applicable MD performing test: S. McDowell/K.Purcell Nails NP Family MD: Nadara Mustard Test explained and consent signed: Yes.   IV started: Saline lock flushed, No redness or edema and Saline lock started in radiology Symptoms: chest tightness Treatment/Intervention: None Reason test stopped: protocol completed After recovery IV was: Discontinued via X-ray tech and No redness or edema Patient to return to Nuc. Med at :12:00 Patient discharged: Home Patient's Condition upon discharge was: stable Comments: During test BP 161/65  & HR 84.  Recovery BP 157/60 & HR 69 .  Symptoms resolved in recovery.  [name] T

## 2014-03-02 ENCOUNTER — Encounter: Payer: Self-pay | Admitting: *Deleted

## 2014-03-26 ENCOUNTER — Encounter: Payer: Self-pay | Admitting: Cardiovascular Disease

## 2014-03-26 ENCOUNTER — Ambulatory Visit (INDEPENDENT_AMBULATORY_CARE_PROVIDER_SITE_OTHER): Payer: Medicare Other | Admitting: Cardiovascular Disease

## 2014-03-26 VITALS — BP 123/69 | HR 75 | Ht 62.0 in | Wt 199.0 lb

## 2014-03-26 DIAGNOSIS — R5383 Other fatigue: Secondary | ICD-10-CM

## 2014-03-26 DIAGNOSIS — R0609 Other forms of dyspnea: Secondary | ICD-10-CM | POA: Diagnosis not present

## 2014-03-26 DIAGNOSIS — E785 Hyperlipidemia, unspecified: Secondary | ICD-10-CM

## 2014-03-26 DIAGNOSIS — N183 Chronic kidney disease, stage 3 unspecified: Secondary | ICD-10-CM

## 2014-03-26 DIAGNOSIS — I2510 Atherosclerotic heart disease of native coronary artery without angina pectoris: Secondary | ICD-10-CM

## 2014-03-26 DIAGNOSIS — I25118 Atherosclerotic heart disease of native coronary artery with other forms of angina pectoris: Secondary | ICD-10-CM

## 2014-03-26 DIAGNOSIS — I209 Angina pectoris, unspecified: Secondary | ICD-10-CM | POA: Diagnosis not present

## 2014-03-26 MED ORDER — ISOSORBIDE MONONITRATE ER 30 MG PO TB24: 30.0000 mg | ORAL_TABLET | Freq: Two times a day (BID) | ORAL | Status: DC

## 2014-03-26 NOTE — Patient Instructions (Signed)
   Increase Imdur to 30mg  twice a day  - new sent to pharm Continue all other medications.   Follow up in  6-8 weeks

## 2014-03-26 NOTE — Progress Notes (Signed)
Patient ID: Annette Sharp, female   DOB: 11-15-1940, 73 y.o.   MRN: [id number]      SUBJECTIVE: The patient returns for follow-up after undergoing cardiac arrest or testing performed for the evaluation of chest pain and fatigue. Nuclear stress testing was normal with no evidence of myocardial ischemia or scar with some breast attenuation artifact noted. She continues to have chest pain with activities such as when cooking a meal and when taking the trash out. If she lies down and rests, she is then able to resume her activities without difficulty. She is depressed today due to two grandchildren (brother and sister) who are not talking to each other, and is tearful when talking about it. Her episodes of chest pain have not been related to anxiety or depression.  Review of Systems: As per "subjective", otherwise negative.  Allergies  Allergen Reactions  . Ace Inhibitors Cough  . Gemfibrozil Other (See Comments)    REACTION: Muscle Aches  . Statins     REACTION: Muscle Cramps  . Moxifloxacin Rash  . Ranexa [Ranolazine] Other (See Comments)    "NIGHTMARES"    Current Outpatient Prescriptions  Medication Sig Dispense Refill  . acyclovir (ZOVIRAX) 400 MG tablet Take 400 mg by mouth every morning.     Marland Kitchen albuterol (PROVENTIL HFA) 108 (90 BASE) MCG/ACT inhaler Inhale 1 puff into the lungs every 6 (six) hours as needed for shortness of breath. 1 Inhaler 1  . amLODipine (NORVASC) 10 MG tablet Take 10 mg by mouth every morning.     Marland Kitchen aspirin 81 MG tablet Take 81 mg by mouth every morning.     . chlorthalidone (HYGROTON) 25 MG tablet Take 0.5 tablets (12.5 mg total) by mouth daily. 45 tablet 3  . citalopram (CELEXA) 20 MG tablet Take 20 mg by mouth every morning.     . clopidogrel (PLAVIX) 75 MG tablet Take 75 mg by mouth every morning.     . clotrimazole (LOTRIMIN) 1 % cream Apply 1 application topically as needed.    . cyclobenzaprine (FLEXERIL) 5 MG tablet Take 5 mg by mouth 3 (three) times  daily as needed for muscle spasms.    . isosorbide mononitrate (IMDUR) 30 MG 24 hr tablet Take 1 tablet (30 mg total) by mouth daily. 90 tablet 3  . levothyroxine (SYNTHROID, LEVOTHROID) 88 MCG tablet Take 88 mcg by mouth every morning.     Marland Kitchen omeprazole (PRILOSEC) 20 MG capsule Take 20 mg by mouth every morning.     . potassium chloride (K-DUR) 10 MEQ tablet Take 2 tabs (55meq) daily x 3 days, then 1 tab (51meq) daily thereafter 30 tablet 6  . rosuvastatin (CRESTOR) 10 MG tablet Take 1 tablet (10 mg total) by mouth daily.     No current facility-administered medications for this visit.    Past Medical History  Diagnosis Date  . Overweight(278.02)     OBESITY  . Hyperlipidemia   . Other and unspecified angina pectoris     ANGINA  . Hypertension   . Back pain   . Peptic ulcer disease   . Thyroid disease     TREATED HYPOTHYROIDISM  . Sleep apnea     SEVERE OBSTRUCTIVE  . 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 Stroke   . Depression   . Chronic renal insufficiency     GFR 45 ML'S PER MINUTE  . Coronary artery disease     S/P CYPHER DRUG-ELUTING STENTS  TO RIGHT CORONARY ARTERY JUNE 2007  . Coronary atherosclerosis of native coronary artery     NEGATIVE LEXISCAN FOR ISCHEMIA MARCH 23,2010 NORMAL LV FUNCTTION    Past Surgical History  Procedure Laterality Date  . Carpal tunnel release    . Cholecystectomy    . Total abdominal hysterectomy w/ bilateral salpingoophorectomy    . Replacement total knee    . Rotator cuff repair      History   Social History  . Marital Status: Married    Spouse Name: N/A    Number of Children: N/A  . Years of Education: N/A   Occupational History  . Not on file.   Social History Main Topics  . Smoking status: Never Smoker   . Smokeless tobacco: Never Used  . Alcohol Use: No  . Drug Use: No  . Sexual Activity: Not on file   Other Topics Concern  . Not on file   Social History Narrative      Filed Vitals:   03/26/14 1538  Height: 5\' 2"  (1.575 m)  Weight: 199 lb (90.266 kg)   BP 123/69  Pulse 75  SpO2 96%    PHYSICAL EXAM General: NAD, tearful HEENT: Normal. Neck: No JVD, no thyromegaly. Lungs: Clear to auscultation bilaterally with normal respiratory effort. CV: Nondisplaced PMI.  Regular rate and rhythm, normal S1/S2, no S3/S4, no murmur. No pretibial or periankle edema.   Abdomen: Soft, nontender, no hepatosplenomegaly, no distention.  Neurologic: Alert and oriented x 3.  Psych: Normal affect. Skin: Normal. Musculoskeletal: Normal range of motion, no gross deformities. Extremities: No clubbing or cyanosis.   ECG: Most recent ECG reviewed.      ASSESSMENT AND PLAN: 1. CAD with chronic stable angina but progressive fatigue: Normal Lexiscan Cardiolite. Continue ASA, Crestor, Imdur (will increase to 30 mg bid) and Plavix. She may have symptoms from microvascular ischemia. I would not rule out the possibility of coronary angiography if symptoms do not resolve.  2. Essential HTN: Well controlled today. Continue chlorthalidone. I am unable to use ACEI/ARB due to stage 3 CKD.  3. COPD: PRN use of albuterol inhaler.  4. Hyperlipidemia: Continue Crestor 10 mg daily. Will recheck lipids in 2 months.  5. Sleep apnea: Using CPAP daily.   6. CKD stage 3: Stable. No changes to chlorthalidone use.  Dispo: f/u 6-8 weeks.   Kate Sable, M.D., F.A.C.C.

## 2014-05-15 ENCOUNTER — Ambulatory Visit: Payer: Medicare Other | Admitting: Cardiovascular Disease

## 2014-06-06 ENCOUNTER — Encounter: Payer: Self-pay | Admitting: Cardiovascular Disease

## 2014-06-06 ENCOUNTER — Ambulatory Visit (INDEPENDENT_AMBULATORY_CARE_PROVIDER_SITE_OTHER): Payer: Medicare Other | Admitting: Cardiovascular Disease

## 2014-06-06 VITALS — BP 130/70 | HR 67 | Ht 62.0 in | Wt 191.0 lb

## 2014-06-06 DIAGNOSIS — I10 Essential (primary) hypertension: Secondary | ICD-10-CM

## 2014-06-06 DIAGNOSIS — J438 Other emphysema: Secondary | ICD-10-CM | POA: Diagnosis not present

## 2014-06-06 DIAGNOSIS — I25118 Atherosclerotic heart disease of native coronary artery with other forms of angina pectoris: Secondary | ICD-10-CM

## 2014-06-06 DIAGNOSIS — E785 Hyperlipidemia, unspecified: Secondary | ICD-10-CM | POA: Diagnosis not present

## 2014-06-06 DIAGNOSIS — R0609 Other forms of dyspnea: Secondary | ICD-10-CM | POA: Diagnosis not present

## 2014-06-06 DIAGNOSIS — G4730 Sleep apnea, unspecified: Secondary | ICD-10-CM | POA: Diagnosis not present

## 2014-06-06 DIAGNOSIS — N183 Chronic kidney disease, stage 3 unspecified: Secondary | ICD-10-CM

## 2014-06-06 NOTE — Progress Notes (Signed)
Patient ID: Annette Sharp, female   DOB: 01-04-1941, 74 y.o.   MRN: [id number]      SUBJECTIVE: The patient presents for follow-up of coronary artery disease. She said she is feeling very well and denies chest pain, palpitations, and leg swelling. She has stable exertional dyspnea when walking to and from her mailbox or after walking back and forth in the kitchen for long periods of time. She said it is not bothersome and believes it is due to her lungs.  ECG performed in the office today demonstrates normal sinus rhythm with no ischemic ST segment or T-wave abnormalities.   Review of Systems: As per "subjective", otherwise negative.  Allergies  Allergen Reactions  . Ace Inhibitors Cough  . Gemfibrozil Other (See Comments)    REACTION: Muscle Aches  . Statins     REACTION: Muscle Cramps  . Moxifloxacin Rash  . Ranexa [Ranolazine] Other (See Comments)    "NIGHTMARES"    Current Outpatient Prescriptions  Medication Sig Dispense Refill  . acyclovir (ZOVIRAX) 400 MG tablet Take 400 mg by mouth every morning.     Marland Kitchen amLODipine (NORVASC) 10 MG tablet Take 10 mg by mouth every morning.     Marland Kitchen aspirin 81 MG tablet Take 81 mg by mouth every morning.     . chlorthalidone (HYGROTON) 25 MG tablet Take 0.5 tablets (12.5 mg total) by mouth daily. 45 tablet 3  . citalopram (CELEXA) 20 MG tablet Take 20 mg by mouth every morning.     . clopidogrel (PLAVIX) 75 MG tablet Take 75 mg by mouth every morning.     . clotrimazole (LOTRIMIN) 1 % cream Apply 1 application topically as needed.    . isosorbide mononitrate (IMDUR) 30 MG 24 hr tablet Take 1 tablet (30 mg total) by mouth 2 (two) times daily. 60 tablet 6  . levothyroxine (SYNTHROID, LEVOTHROID) 88 MCG tablet Take 88 mcg by mouth every morning.     Marland Kitchen omeprazole (PRILOSEC) 20 MG capsule Take 20 mg by mouth every morning.     . potassium chloride (K-DUR) 10 MEQ tablet Take 2 tabs (71meq) daily x 3 days, then 1 tab (32meq) daily thereafter 30 tablet 6    . rosuvastatin (CRESTOR) 10 MG tablet Take 1 tablet (10 mg total) by mouth daily.    Marland Kitchen albuterol (PROVENTIL HFA) 108 (90 BASE) MCG/ACT inhaler Inhale 1 puff into the lungs every 6 (six) hours as needed for shortness of breath. 1 Inhaler 1  . cyclobenzaprine (FLEXERIL) 5 MG tablet Take 5 mg by mouth 3 (three) times daily as needed for muscle spasms.     No current facility-administered medications for this visit.    Past Medical History  Diagnosis Date  . Overweight(278.02)     OBESITY  . Hyperlipidemia   . Other and unspecified angina pectoris     ANGINA  . Hypertension   . Back pain   . Peptic ulcer disease   . Thyroid disease     TREATED HYPOTHYROIDISM  . Sleep apnea     SEVERE OBSTRUCTIVE  . 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 Stroke   . Depression   . Chronic renal insufficiency     GFR 45 ML'S PER MINUTE  . Coronary artery disease     S/P CYPHER DRUG-ELUTING STENTS TO RIGHT CORONARY ARTERY JUNE 2007  . Coronary atherosclerosis of native coronary artery     NEGATIVE LEXISCAN FOR ISCHEMIA MARCH 23,2010 NORMAL  LV FUNCTTION    Past Surgical History  Procedure Laterality Date  . Carpal tunnel release    . Cholecystectomy    . Total abdominal hysterectomy w/ bilateral salpingoophorectomy    . Replacement total knee    . Rotator cuff repair      History   Social History  . Marital Status: Married    Spouse Name: N/A  . Number of Children: N/A  . Years of Education: N/A   Occupational History  . Not on file.   Social History Main Topics  . Smoking status: Never Smoker   . Smokeless tobacco: Never Used  . Alcohol Use: No  . Drug Use: No  . Sexual Activity: No   Other Topics Concern  . Not on file   Social History Narrative     Filed Vitals:   06/06/14 1313  BP: 130/70  Pulse: 67  Height: 5\' 2"  (1.575 m)  Weight: 191 lb (86.637 kg)  SpO2: 97%    PHYSICAL EXAM General: NAD HEENT: Normal. Neck: No  JVD, no thyromegaly. Lungs: Clear to auscultation bilaterally with normal respiratory effort. CV: Nondisplaced PMI.  Regular rate and rhythm, normal S1/S2, no S3/S4, soft 1/6 pansystolic murmur along left sternal border. No pretibial or periankle edema.  No carotid bruit.  Normal pedal pulses.  Abdomen: Soft, nontender, no hepatosplenomegaly, no distention.  Neurologic: Alert and oriented x 3.  Psych: Normal affect. Skin: Normal. Musculoskeletal: Normal range of motion, no gross deformities. Extremities: No clubbing or cyanosis.   ECG: Most recent ECG reviewed.      ASSESSMENT AND PLAN: 1. CAD with chronic stable angina: Normal Lexiscan Cardiolite on 02/27/14. Continue ASA, Crestor, Imdur, and Plavix. She may have symptoms from microvascular ischemia. I would not rule out the possibility of coronary angiography if symptoms were to progress in the future.  2. Essential HTN: Well controlled today. Continue chlorthalidone. I am unable to use ACEI/ARB due to stage 3 CKD.  3. COPD: PRN use of albuterol inhaler.  4. Hyperlipidemia: Continue Crestor 10 mg daily. Will recheck lipids.  5. Sleep apnea: Using CPAP daily.   6. CKD stage 3: Stable. No changes to chlorthalidone use.  Dispo: f/u 4 months.   Kate Sable, M.D., F.A.C.C.

## 2014-06-06 NOTE — Patient Instructions (Signed)
Your physician wants you to follow-up in: 4 MONTHS WITH DR KONESWARAN You will receive a reminder letter in the mail two months in advance. If you don't receive a letter, please call our office to schedule the follow-up appointment.  Your physician recommends that you continue on your current medications as directed. Please refer to the Current Medication list given to you today.  Thank you for choosing [facility]!!    

## 2014-06-07 DIAGNOSIS — E785 Hyperlipidemia, unspecified: Secondary | ICD-10-CM | POA: Diagnosis not present

## 2014-06-11 ENCOUNTER — Telehealth: Payer: Self-pay | Admitting: *Deleted

## 2014-06-11 NOTE — Telephone Encounter (Signed)
Pt made aware, forwarded to Dr. Nadara Mustard

## 2014-06-11 NOTE — Telephone Encounter (Signed)
-----   Message from Herminio Commons, MD sent at 06/08/2014  2:42 PM EST ----- Good results.

## 2014-06-12 DIAGNOSIS — H6192 Disorder of left external ear, unspecified: Secondary | ICD-10-CM | POA: Diagnosis not present

## 2014-06-12 DIAGNOSIS — J019 Acute sinusitis, unspecified: Secondary | ICD-10-CM | POA: Diagnosis not present

## 2014-08-03 ENCOUNTER — Encounter (HOSPITAL_COMMUNITY): Payer: Self-pay | Admitting: Emergency Medicine

## 2014-08-03 ENCOUNTER — Emergency Department (HOSPITAL_COMMUNITY): Payer: Medicare Other

## 2014-08-03 ENCOUNTER — Emergency Department (HOSPITAL_COMMUNITY)
Admission: EM | Admit: 2014-08-03 | Discharge: 2014-08-03 | Disposition: A | Discharge status: Home / Self Care | Payer: Medicare Other | Attending: Emergency Medicine | Admitting: Emergency Medicine

## 2014-08-03 DIAGNOSIS — Y998 Other external cause status: Secondary | ICD-10-CM | POA: Insufficient documentation

## 2014-08-03 DIAGNOSIS — F329 Major depressive disorder, single episode, unspecified: Secondary | ICD-10-CM | POA: Insufficient documentation

## 2014-08-03 DIAGNOSIS — Z8669 Personal history of other diseases of the nervous system and sense organs: Secondary | ICD-10-CM | POA: Diagnosis not present

## 2014-08-03 DIAGNOSIS — I2510 Atherosclerotic heart disease of native coronary artery without angina pectoris: Secondary | ICD-10-CM | POA: Insufficient documentation

## 2014-08-03 DIAGNOSIS — E663 Overweight: Secondary | ICD-10-CM | POA: Insufficient documentation

## 2014-08-03 DIAGNOSIS — Z79899 Other long term (current) drug therapy: Secondary | ICD-10-CM | POA: Insufficient documentation

## 2014-08-03 DIAGNOSIS — Z7982 Long term (current) use of aspirin: Secondary | ICD-10-CM | POA: Insufficient documentation

## 2014-08-03 DIAGNOSIS — E785 Hyperlipidemia, unspecified: Secondary | ICD-10-CM | POA: Insufficient documentation

## 2014-08-03 DIAGNOSIS — I10 Essential (primary) hypertension: Secondary | ICD-10-CM | POA: Diagnosis not present

## 2014-08-03 DIAGNOSIS — E860 Dehydration: Secondary | ICD-10-CM | POA: Diagnosis not present

## 2014-08-03 DIAGNOSIS — E876 Hypokalemia: Secondary | ICD-10-CM | POA: Insufficient documentation

## 2014-08-03 DIAGNOSIS — Y9389 Activity, other specified: Secondary | ICD-10-CM | POA: Diagnosis not present

## 2014-08-03 DIAGNOSIS — E079 Disorder of thyroid, unspecified: Secondary | ICD-10-CM | POA: Insufficient documentation

## 2014-08-03 DIAGNOSIS — Z8739 Personal history of other diseases of the musculoskeletal system and connective tissue: Secondary | ICD-10-CM | POA: Diagnosis not present

## 2014-08-03 DIAGNOSIS — W01198A Fall on same level from slipping, tripping and stumbling with subsequent striking against other object, initial encounter: Secondary | ICD-10-CM | POA: Insufficient documentation

## 2014-08-03 DIAGNOSIS — I129 Hypertensive chronic kidney disease with stage 1 through stage 4 chronic kidney disease, or unspecified chronic kidney disease: Secondary | ICD-10-CM | POA: Diagnosis not present

## 2014-08-03 DIAGNOSIS — Y9289 Other specified places as the place of occurrence of the external cause: Secondary | ICD-10-CM | POA: Insufficient documentation

## 2014-08-03 DIAGNOSIS — N189 Chronic kidney disease, unspecified: Secondary | ICD-10-CM | POA: Insufficient documentation

## 2014-08-03 DIAGNOSIS — Z7902 Long term (current) use of antithrombotics/antiplatelets: Secondary | ICD-10-CM | POA: Insufficient documentation

## 2014-08-03 DIAGNOSIS — S0990XA Unspecified injury of head, initial encounter: Secondary | ICD-10-CM | POA: Diagnosis present

## 2014-08-03 DIAGNOSIS — Z8673 Personal history of transient ischemic attack (TIA), and cerebral infarction without residual deficits: Secondary | ICD-10-CM | POA: Diagnosis not present

## 2014-08-03 DIAGNOSIS — W19XXXA Unspecified fall, initial encounter: Secondary | ICD-10-CM

## 2014-08-03 DIAGNOSIS — Z8711 Personal history of peptic ulcer disease: Secondary | ICD-10-CM | POA: Insufficient documentation

## 2014-08-03 LAB — URINALYSIS, ROUTINE W REFLEX MICROSCOPIC
Bilirubin Urine: NEGATIVE
Glucose, UA: NEGATIVE mg/dL
Hgb urine dipstick: NEGATIVE
Ketones, ur: NEGATIVE mg/dL
LEUKOCYTES UA: NEGATIVE
Nitrite: NEGATIVE
PH: 6 (ref 5.0–8.0)
PROTEIN: NEGATIVE mg/dL
Specific Gravity, Urine: 1.02 (ref 1.005–1.030)
UROBILINOGEN UA: 1 mg/dL (ref 0.0–1.0)

## 2014-08-03 LAB — CBC WITH DIFFERENTIAL/PLATELET
Basophils Absolute: 0 10*3/uL (ref 0.0–0.1)
Basophils Relative: 0 % (ref 0–1)
EOS ABS: 0.2 10*3/uL (ref 0.0–0.7)
Eosinophils Relative: 2 % (ref 0–5)
HEMATOCRIT: 39.3 % (ref 36.0–46.0)
Hemoglobin: 13.9 g/dL (ref 12.0–15.0)
Lymphocytes Relative: 31 % (ref 12–46)
Lymphs Abs: 2.8 10*3/uL (ref 0.7–4.0)
MCH: 32 pg (ref 26.0–34.0)
MCHC: 35.4 g/dL (ref 30.0–36.0)
MCV: 90.3 fL (ref 78.0–100.0)
Monocytes Absolute: 0.9 10*3/uL (ref 0.1–1.0)
Monocytes Relative: 10 % (ref 3–12)
NEUTROS ABS: 5 10*3/uL (ref 1.7–7.7)
Neutrophils Relative %: 57 % (ref 43–77)
Platelets: 250 10*3/uL (ref 150–400)
RBC: 4.35 MIL/uL (ref 3.87–5.11)
RDW: 12.5 % (ref 11.5–15.5)
WBC: 8.9 10*3/uL (ref 4.0–10.5)

## 2014-08-03 LAB — BASIC METABOLIC PANEL
Anion gap: 11 (ref 5–15)
BUN: 22 mg/dL — AB (ref 6–20)
CHLORIDE: 98 mmol/L — AB (ref 101–111)
CO2: 29 mmol/L (ref 22–32)
CREATININE: 1.62 mg/dL — AB (ref 0.44–1.00)
Calcium: 9.2 mg/dL (ref 8.9–10.3)
GFR calc Af Amer: 36 mL/min — ABNORMAL LOW (ref >60)
GFR calc non Af Amer: 31 mL/min — ABNORMAL LOW (ref >60)
Glucose, Bld: 98 mg/dL (ref 70–99)
Potassium: 2.9 mmol/L — ABNORMAL LOW (ref 3.5–5.1)
Sodium: 138 mmol/L (ref 135–145)

## 2014-08-03 MED ORDER — POTASSIUM CHLORIDE CRYS ER 20 MEQ PO TBCR
40.0000 meq | EXTENDED_RELEASE_TABLET | Freq: Once | ORAL | Status: AC
  Administered 2014-08-03: 40 meq via ORAL
  Filled 2014-08-03: qty 2

## 2014-08-03 NOTE — ED Provider Notes (Signed)
CSN: [id number]     Arrival date & time 08/03/14  0723 History   None    Chief Complaint  Patient presents with  . Fall     (Consider location/radiation/quality/duration/timing/severity/associated sxs/prior Treatment) HPI Comments: Patient presents to the emergency department for evaluation of fall. Patient reports that she fell twice 2 days ago. Patient reports that she slipped and fell in the morning, falling forward and hitting her head and face on a carpeted floor. No loss of consciousness. She reports that she has been feeling groggy ever since. She fell again later that night, is not sure what caused her fall at that time. She does report a history of stroke and mini strokes. Additionally, she has been having problems with her arthritis, mostly on the right side for the last 2 weeks.  Patient reports that she continues to feel off balance since the fall. She does not have headaches, neck pain, back pain. There was no chest pain, heart palpitations, shortness of breath associated with the episodes.  Patient is a 74 y.o. female presenting with fall.  Fall Pertinent negatives include no headaches.    Past Medical History  Diagnosis Date  . Overweight(278.02)     OBESITY  . Hyperlipidemia   . Other and unspecified angina pectoris     ANGINA  . Hypertension   . Back pain   . Peptic ulcer disease   . Thyroid disease     TREATED HYPOTHYROIDISM  . Sleep apnea     SEVERE OBSTRUCTIVE  . 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 Stroke   . Depression   . Chronic renal insufficiency     GFR 45 ML'S PER MINUTE  . Coronary artery disease     S/P CYPHER DRUG-ELUTING STENTS TO RIGHT CORONARY ARTERY JUNE 2007  . Coronary atherosclerosis of native coronary artery     NEGATIVE LEXISCAN FOR ISCHEMIA MARCH 23,2010 NORMAL LV FUNCTTION   Past Surgical History  Procedure Laterality Date  . Carpal tunnel release    . Cholecystectomy    . Total  abdominal hysterectomy w/ bilateral salpingoophorectomy    . Replacement total knee    . Rotator cuff repair     History reviewed. No pertinent family history. History  Substance Use Topics  . Smoking status: Never Smoker   . Smokeless tobacco: Never Used  . Alcohol Use: No   OB History    Gravida Para Term Preterm AB TAB SAB Ectopic Multiple Living            2     Review of Systems  Musculoskeletal: Positive for arthralgias. Negative for back pain and neck pain.  Neurological: Negative for headaches.  All other systems reviewed and are negative.     Allergies  Ace inhibitors; Gemfibrozil; Statins; Moxifloxacin; and Ranexa  Home Medications   Prior to Admission medications   Medication Sig Start Date End Date Taking? Authorizing Provider  acyclovir (ZOVIRAX) 400 MG tablet Take 400 mg by mouth every morning.     Historical Provider, MD  albuterol (PROVENTIL HFA) 108 (90 BASE) MCG/ACT inhaler Inhale 1 puff into the lungs every 6 (six) hours as needed for shortness of breath. 01/26/13   Herminio Commons, MD  amLODipine (NORVASC) 10 MG tablet Take 10 mg by mouth every morning.     Donney Dice, PA-C  aspirin 81 MG tablet Take 81 mg by mouth every morning.     Historical Provider, MD  chlorthalidone (HYGROTON) 25 MG tablet Take 0.5 tablets (12.5 mg total) by mouth daily. 11/10/13   Herminio Commons, MD  citalopram (CELEXA) 20 MG tablet Take 20 mg by mouth every morning.     Historical Provider, MD  clopidogrel (PLAVIX) 75 MG tablet Take 75 mg by mouth every morning.     Historical Provider, MD  clotrimazole (LOTRIMIN) 1 % cream Apply 1 application topically as needed.    Historical Provider, MD  cyclobenzaprine (FLEXERIL) 5 MG tablet Take 5 mg by mouth 3 (three) times daily as needed for muscle spasms.    Historical Provider, MD  isosorbide mononitrate (IMDUR) 30 MG 24 hr tablet Take 1 tablet (30 mg total) by mouth 2 (two) times daily. 03/26/14   Herminio Commons, MD   levothyroxine (SYNTHROID, LEVOTHROID) 88 MCG tablet Take 88 mcg by mouth every morning.     Historical Provider, MD  omeprazole (PRILOSEC) 20 MG capsule Take 20 mg by mouth every morning.     Historical Provider, MD  potassium chloride (K-DUR) 10 MEQ tablet Take 2 tabs (20meq) daily x 3 days, then 1 tab (50meq) daily thereafter 11/23/13   Herminio Commons, MD  rosuvastatin (CRESTOR) 10 MG tablet Take 1 tablet (10 mg total) by mouth daily. 02/09/14   Herminio Commons, MD   BP 169/60 mmHg  Pulse 67  Temp(Src) 97.8 F (36.6 C) (Oral)  Resp 18  Ht 5\' 2"  (1.575 m)  Wt 190 lb (86.183 kg)  BMI 34.74 kg/m2  SpO2 98% Physical Exam  Constitutional: She is oriented to person, place, and time. She appears well-developed and well-nourished. No distress.  HENT:  Head: Normocephalic and atraumatic.  Right Ear: Hearing normal.  Left Ear: Hearing normal.  Nose: Nose normal.  Mouth/Throat: Oropharynx is clear and moist and mucous membranes are normal.  Eyes: Conjunctivae and EOM are normal. Pupils are equal, round, and reactive to light.  Neck: Normal range of motion. Neck supple.  Cardiovascular: Regular rhythm, S1 normal and S2 normal.  Exam reveals no gallop and no friction rub.   No murmur heard. Pulmonary/Chest: Effort normal and breath sounds normal. No respiratory distress. She exhibits no tenderness.  Abdominal: Soft. Normal appearance and bowel sounds are normal. There is no hepatosplenomegaly. There is no tenderness. There is no rebound, no guarding, no tenderness at McBurney's point and negative Murphy's sign. No hernia.  Musculoskeletal: Normal range of motion.  Neurological: She is alert and oriented to person, place, and time. She has normal strength. No cranial nerve deficit or sensory deficit. Coordination normal. GCS eye subscore is 4. GCS verbal subscore is 5. GCS motor subscore is 6.  Skin: Skin is warm, dry and intact. No rash noted. No cyanosis.  Psychiatric: She has a normal  mood and affect. Her speech is normal and behavior is normal. Thought content normal.  Nursing note and vitals reviewed.   ED Course  Procedures (including critical care time) Labs Review Labs Reviewed  BASIC METABOLIC PANEL - Abnormal; Notable for the following:    Potassium 2.9 (*)    Chloride 98 (*)    BUN 22 (*)    Creatinine, Ser 1.62 (*)    GFR calc non Af Amer 31 (*)    GFR calc Af Amer 36 (*)    All other components within normal limits  CBC WITH DIFFERENTIAL/PLATELET  URINALYSIS, ROUTINE W REFLEX MICROSCOPIC    Imaging Review Ct Head Wo Contrast  08/03/2014   CLINICAL DATA:  74 year old female  with a history of fall.  EXAM: CT HEAD WITHOUT CONTRAST  TECHNIQUE: Contiguous axial images were obtained from the base of the skull through the vertex without intravenous contrast.  COMPARISON:  Brain MRI 01/10/2007  FINDINGS: Unremarkable appearance of the calvarium without acute fracture or aggressive lesion.  Unremarkable appearance of the scalp soft tissues.  Unremarkable appearance of the bilateral orbits.  Mastoid air cells are clear.  No significant paranasal sinus disease  No acute intracranial hemorrhage, midline shift, or mass effect.  Gray-white differentiation is maintained, without CT evidence of acute ischemia.  Unremarkable configuration of the ventricles.  Confluent hypodensity in the left parietal white matter overlying the posterior horn, similar size and distribution to the prior MRI.  IMPRESSION: No CT evidence of acute intracranial abnormality.  Mild changes of chronic white matter disease, as above.  Signed,  Dulcy Fanny. Earleen Newport, DO  [facility]  [facility]   Electronically Signed   By: Corrie Mckusick D.O.   On: 08/03/2014 08:04     EKG Interpretation   Date/Time:  Friday Aug 03 2014 07:38:44 EDT Ventricular Rate:  63 PR Interval:  143 QRS Duration: 74 QT Interval:  416 QTC Calculation: 426 R Axis:   32 Text  Interpretation:  Sinus rhythm Anteroseptal infarct, age indeterminate  No significant change since last tracing Confirmed by Sharlie Shreffler  MD,  [place] [phone]) on 08/03/2014 7:41:23 AM      MDM   Final diagnoses:  Dehydration  Hypokalemia    Patient presents to the ER for evaluation of a fall which occurred 2 days ago. Patient reports falling twice a day. The initial fall was from losing her balance, but she did fall again later that day and is not sure what caused the second fall. Patient concerned because she hit her head and is on Plavix. She has normal neurologic function here in the ER. CT head was unremarkable. Basic labs performed because she says she has been feeling off-balance recently. Urinalysis does not suggest infection. CBC is normal. Patient does have hypokalemia today. She reports chronic hypokalemia, takes potassium at home. She has not taken her potassium today. She was given 40 mEq here in the ER. Additionally, renal function is worse than it has been in the past. She does have a history of kidney failure, but creatinine is up to 1.6, most recent was 1.2. She will need to have this rechecked in 1 week by her doctor. Patient was instructed that she should increase her fluid intake for the next 1 or 2 days.    Orpah Greek, MD 08/03/14 0830

## 2014-08-03 NOTE — Discharge Instructions (Signed)
Dehydration, Adult Dehydration is when you lose more fluids from the body than you take in. Vital organs like the kidneys, brain, and heart cannot function without a proper amount of fluids and salt. Any loss of fluids from the body can cause dehydration.  CAUSES   Vomiting.  Diarrhea.  Excessive sweating.  Excessive urine output.  Fever. SYMPTOMS  Mild dehydration  Thirst.  Dry lips.  Slightly dry mouth. Moderate dehydration  Very dry mouth.  Sunken eyes.  Skin does not bounce back quickly when lightly pinched and released.  Dark urine and decreased urine production.  Decreased tear production.  Headache. Severe dehydration  Very dry mouth.  Extreme thirst.  Rapid, weak pulse (more than 100 beats per minute at rest).  Cold hands and feet.  Not able to sweat in spite of heat and temperature.  Rapid breathing.  Blue lips.  Confusion and lethargy.  Difficulty being awakened.  Minimal urine production.  No tears. DIAGNOSIS  Your caregiver will diagnose dehydration based on your symptoms and your exam. Blood and urine tests will help confirm the diagnosis. The diagnostic evaluation should also identify the cause of dehydration. TREATMENT  Treatment of mild or moderate dehydration can often be done at home by increasing the amount of fluids that you drink. It is best to drink small amounts of fluid more often. Drinking too much at one time can make vomiting worse. Refer to the home care instructions below. Severe dehydration needs to be treated at the hospital where you will probably be given intravenous (IV) fluids that contain water and electrolytes. HOME CARE INSTRUCTIONS   Ask your caregiver about specific rehydration instructions.  Drink enough fluids to keep your urine clear or pale yellow.  Drink small amounts frequently if you have nausea and vomiting.  Eat as you normally do.  Avoid:  Foods or drinks high in sugar.  Carbonated  drinks.  Juice.  Extremely hot or cold fluids.  Drinks with caffeine.  Fatty, greasy foods.  Alcohol.  Tobacco.  Overeating.  Gelatin desserts.  Wash your hands well to avoid spreading bacteria and viruses.  Only take over-the-counter or prescription medicines for pain, discomfort, or fever as directed by your caregiver.  Ask your caregiver if you should continue all prescribed and over-the-counter medicines.  Keep all follow-up appointments with your caregiver. SEEK MEDICAL CARE IF:  You have abdominal pain and it increases or stays in one area (localizes).  You have a rash, stiff neck, or severe headache.  You are irritable, sleepy, or difficult to awaken.  You are weak, dizzy, or extremely thirsty. SEEK IMMEDIATE MEDICAL CARE IF:   You are unable to keep fluids down or you get worse despite treatment.  You have frequent episodes of vomiting or diarrhea.  You have blood or green matter (bile) in your vomit.  You have blood in your stool or your stool looks black and tarry.  You have not urinated in 6 to 8 hours, or you have only urinated a small amount of very dark urine.  You have a fever.  You faint. MAKE SURE YOU:   Understand these instructions.  Will watch your condition.  Will get help right away if you are not doing well or get worse. Document Released: 03/16/2005 Document Revised: 06/08/2011 Document Reviewed: 11/03/2010 ExitCare Patient Information 2015 [employer]. This information is not intended to replace advice given to you by your health care provider. Make sure you discuss any questions you have with your health care   provider. Hypokalemia Hypokalemia means that the amount of potassium in the blood is lower than normal.Potassium is a chemical, called an electrolyte, that helps regulate the amount of fluid in the body. It also stimulates muscle contraction and helps nerves function properly.Most of the body's potassium is inside of  cells, and only a very small amount is in the blood. Because the amount in the blood is so small, minor changes can be life-threatening. CAUSES  Antibiotics.  Diarrhea or vomiting.  Using laxatives too much, which can cause diarrhea.  Chronic kidney disease.  Water pills (diuretics).  Eating disorders (bulimia).  Low magnesium level.  Sweating a lot. SIGNS AND SYMPTOMS  Weakness.  Constipation.  Fatigue.  Muscle cramps.  Mental confusion.  Skipped heartbeats or irregular heartbeat (palpitations).  Tingling or numbness. DIAGNOSIS  Your health care provider can diagnose hypokalemia with blood tests. In addition to checking your potassium level, your health care provider may also check other lab tests. TREATMENT Hypokalemia can be treated with potassium supplements taken by mouth or adjustments in your current medicines. If your potassium level is very low, you may need to get potassium through a vein (IV) and be monitored in the hospital. A diet high in potassium is also helpful. Foods high in potassium are:  Nuts, such as peanuts and pistachios.  Seeds, such as sunflower seeds and pumpkin seeds.  Peas, lentils, and lima beans.  Whole grain and bran cereals and breads.  Fresh fruit and vegetables, such as apricots, avocado, bananas, cantaloupe, kiwi, oranges, tomatoes, asparagus, and potatoes.  Orange and tomato juices.  Red meats.  Fruit yogurt. HOME CARE INSTRUCTIONS  Take all medicines as prescribed by your health care provider.  Maintain a healthy diet by including nutritious food, such as fruits, vegetables, nuts, whole grains, and lean meats.  If you are taking a laxative, be sure to follow the directions on the label. SEEK MEDICAL CARE IF:  Your weakness gets worse.  You feel your heart pounding or racing.  You are vomiting or having diarrhea.  You are diabetic and having trouble keeping your blood glucose in the normal range. SEEK IMMEDIATE  MEDICAL CARE IF:  You have chest pain, shortness of breath, or dizziness.  You are vomiting or having diarrhea for more than 2 days.  You faint. MAKE SURE YOU:   Understand these instructions.  Will watch your condition.  Will get help right away if you are not doing well or get worse. Document Released: 03/16/2005 Document Revised: 01/04/2013 Document Reviewed: 09/16/2012 ExitCare Patient Information 2015 [employer]. This information is not intended to replace advice given to you by your health care provider. Make sure you discuss any questions you have with your health care provider.  

## 2014-08-03 NOTE — ED Notes (Signed)
PT stated she slipped and fell 08/01/14 in the am and hit her head and face and denies any LOC at the time and states since the fall has had 2 more falls and states she feels off balance and takes Plavix.

## 2014-08-04 DIAGNOSIS — H40003 Preglaucoma, unspecified, bilateral: Secondary | ICD-10-CM | POA: Diagnosis not present

## 2014-08-08 DIAGNOSIS — E039 Hypothyroidism, unspecified: Secondary | ICD-10-CM | POA: Diagnosis not present

## 2014-08-08 DIAGNOSIS — Z1389 Encounter for screening for other disorder: Secondary | ICD-10-CM | POA: Diagnosis not present

## 2014-08-16 ENCOUNTER — Other Ambulatory Visit (HOSPITAL_COMMUNITY): Payer: Self-pay | Admitting: Family Medicine

## 2014-08-16 DIAGNOSIS — Z1231 Encounter for screening mammogram for malignant neoplasm of breast: Secondary | ICD-10-CM

## 2014-08-17 ENCOUNTER — Encounter (INDEPENDENT_AMBULATORY_CARE_PROVIDER_SITE_OTHER): Payer: Self-pay | Admitting: *Deleted

## 2014-08-23 ENCOUNTER — Encounter (INDEPENDENT_AMBULATORY_CARE_PROVIDER_SITE_OTHER): Payer: Self-pay

## 2014-08-30 ENCOUNTER — Ambulatory Visit (HOSPITAL_COMMUNITY)
Admission: RE | Admit: 2014-08-30 | Discharge: 2014-08-30 | Disposition: A | Discharge status: Home / Self Care | Payer: Medicare Other | Source: Ambulatory Visit | Attending: Family Medicine | Admitting: Family Medicine

## 2014-08-30 DIAGNOSIS — Z1231 Encounter for screening mammogram for malignant neoplasm of breast: Secondary | ICD-10-CM

## 2014-10-08 ENCOUNTER — Encounter: Payer: Self-pay | Admitting: *Deleted

## 2014-10-11 ENCOUNTER — Ambulatory Visit (INDEPENDENT_AMBULATORY_CARE_PROVIDER_SITE_OTHER): Payer: Medicare Other | Admitting: Cardiovascular Disease

## 2014-10-11 ENCOUNTER — Encounter: Payer: Self-pay | Admitting: Cardiovascular Disease

## 2014-10-11 VITALS — BP 130/70 | HR 62 | Ht 62.0 in | Wt 191.0 lb

## 2014-10-11 DIAGNOSIS — E876 Hypokalemia: Secondary | ICD-10-CM

## 2014-10-11 DIAGNOSIS — I10 Essential (primary) hypertension: Secondary | ICD-10-CM | POA: Diagnosis not present

## 2014-10-11 DIAGNOSIS — I209 Angina pectoris, unspecified: Secondary | ICD-10-CM

## 2014-10-11 DIAGNOSIS — I25118 Atherosclerotic heart disease of native coronary artery with other forms of angina pectoris: Secondary | ICD-10-CM

## 2014-10-11 DIAGNOSIS — R0609 Other forms of dyspnea: Secondary | ICD-10-CM | POA: Diagnosis not present

## 2014-10-11 DIAGNOSIS — N183 Chronic kidney disease, stage 3 unspecified: Secondary | ICD-10-CM

## 2014-10-11 DIAGNOSIS — E785 Hyperlipidemia, unspecified: Secondary | ICD-10-CM

## 2014-10-11 DIAGNOSIS — J438 Other emphysema: Secondary | ICD-10-CM

## 2014-10-11 NOTE — Addendum Note (Signed)
Addended by: Laurine Blazer on: 10/11/2014 10:18 AM   Modules accepted: Orders, Level of Service

## 2014-10-11 NOTE — Progress Notes (Signed)
Patient ID: Annette Sharp, female   DOB: 1940-12-08, 74 y.o.   MRN: [id number]      SUBJECTIVE: The patient presents for follow-up of coronary artery disease. She said she is feeling very well and denies chest pain when walking on level ground, palpitations, and leg swelling. She has stable exertional dyspnea.  If she climbs a flight of stairs, she has chest tightness and lies down for approximately 20 minutes. However, she feels she is doing much better.   Review of Systems: As per "subjective", otherwise negative.  Allergies  Allergen Reactions  . Ace Inhibitors Cough  . Gemfibrozil Other (See Comments)    REACTION: Muscle Aches  . Statins     REACTION: Muscle Cramps  . Moxifloxacin Rash  . Ranexa [Ranolazine] Other (See Comments)    "NIGHTMARES"    Current Outpatient Prescriptions  Medication Sig Dispense Refill  . acyclovir (ZOVIRAX) 400 MG tablet Take 400 mg by mouth every morning.     Marland Kitchen albuterol (PROVENTIL HFA) 108 (90 BASE) MCG/ACT inhaler Inhale 1 puff into the lungs every 6 (six) hours as needed for shortness of breath. 1 Inhaler 1  . amLODipine (NORVASC) 10 MG tablet Take 10 mg by mouth every morning.     Marland Kitchen aspirin 81 MG tablet Take 81 mg by mouth every morning.     . chlorthalidone (HYGROTON) 25 MG tablet Take 0.5 tablets (12.5 mg total) by mouth daily. 45 tablet 3  . citalopram (CELEXA) 20 MG tablet Take 20 mg by mouth every morning.     . clopidogrel (PLAVIX) 75 MG tablet Take 75 mg by mouth every morning.     . clotrimazole (LOTRIMIN) 1 % cream Apply 1 application topically as needed.    . cyclobenzaprine (FLEXERIL) 5 MG tablet Take 5 mg by mouth 3 (three) times daily as needed for muscle spasms.    . isosorbide mononitrate (IMDUR) 30 MG 24 hr tablet Take 1 tablet (30 mg total) by mouth 2 (two) times daily. 60 tablet 6  . levothyroxine (SYNTHROID, LEVOTHROID) 88 MCG tablet Take 88 mcg by mouth every morning.     Marland Kitchen omeprazole (PRILOSEC) 20 MG capsule Take 20 mg by  mouth every morning.     . potassium chloride (K-DUR) 10 MEQ tablet Take 2 tabs (43meq) daily x 3 days, then 1 tab (57meq) daily thereafter 30 tablet 6  . rosuvastatin (CRESTOR) 5 MG tablet Take 5 mg by mouth daily.     No current facility-administered medications for this visit.    Past Medical History  Diagnosis Date  . Overweight(278.02)     OBESITY  . Hyperlipidemia   . Other and unspecified angina pectoris     ANGINA  . Hypertension   . Back pain   . Peptic ulcer disease   . Thyroid disease     TREATED HYPOTHYROIDISM  . Sleep apnea     SEVERE OBSTRUCTIVE  . 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 Stroke   . Depression   . Chronic renal insufficiency     GFR 45 ML'S PER MINUTE  . Coronary artery disease     S/P CYPHER DRUG-ELUTING STENTS TO RIGHT CORONARY ARTERY JUNE 2007  . Coronary atherosclerosis of native coronary artery     NEGATIVE LEXISCAN FOR ISCHEMIA MARCH 23,2010 NORMAL LV FUNCTTION    Past Surgical History  Procedure Laterality Date  . Carpal tunnel release    . Cholecystectomy    .  Total abdominal hysterectomy w/ bilateral salpingoophorectomy    . Replacement total knee    . Rotator cuff repair      History   Social History  . Marital Status: Married    Spouse Name: N/A  . Number of Children: N/A  . Years of Education: N/A   Occupational History  . Not on file.   Social History Main Topics  . Smoking status: Never Smoker   . Smokeless tobacco: Never Used  . Alcohol Use: No  . Drug Use: No  . Sexual Activity: No   Other Topics Concern  . Not on file   Social History Narrative     Filed Vitals:   10/11/14 0955  BP: 130/70  Pulse: 62  Height: 5\' 2"  (1.575 m)  Weight: 191 lb (86.637 kg)  SpO2: 97%    PHYSICAL EXAM General: NAD HEENT: Normal. Neck: No JVD, no thyromegaly. Lungs: Clear to auscultation bilaterally with normal respiratory effort. CV: Nondisplaced PMI. Regular rate and  rhythm, normal S1/S2, no S3/S4, soft 1/6 pansystolic murmur along left sternal border. No pretibial or periankle edema. No carotid bruit. Normal pedal pulses.  Abdomen: Soft, nontender, no distention.  Neurologic: Alert and oriented x 3.  Psych: Normal affect. Skin: Normal. Musculoskeletal: Normal range of motion, no gross deformities. Extremities: No clubbing or cyanosis.   ECG: Most recent ECG reviewed.      ASSESSMENT AND PLAN: 1. CAD with chronic stable angina: Normal Lexiscan Cardiolite on 02/27/14. Continue ASA, Crestor, Imdur, and Plavix. She may have symptoms from microvascular ischemia. I would not rule out the possibility of coronary angiography if symptoms were to progress in the future.  2. Essential HTN: Well controlled today. Continue chlorthalidone. I am unable to use ACEI/ARB due to stage 3 CKD.  3. COPD: PRN use of albuterol inhaler.  4. Hyperlipidemia: Lipids on 06/07/14 showed total cholesterol 156, triglycerides 71, HDL 65, LDL 77. Continue Crestor 10 mg daily for now.   5. Sleep apnea: Using CPAP daily.   6. CKD stage 3: BUN 22 and creatinine 1.62 on 08/03/14. No changes to chlorthalidone use.  7. Hypokalemia: Low K in May when in ED. Will repeat BMET.  Dispo: f/u 4 months.   Kate Sable, M.D., F.A.C.C.

## 2014-10-11 NOTE — Patient Instructions (Signed)
   Lab for [facility] will contact with results via phone or letter.   Continue all current medications. Your physician wants you to follow up in:  4 months.  You will receive a reminder letter in the mail one-two months in advance.  If you don't receive a letter, please call our office to schedule the follow up appointment

## 2014-10-12 ENCOUNTER — Telehealth: Payer: Self-pay | Admitting: *Deleted

## 2014-10-12 DIAGNOSIS — E876 Hypokalemia: Secondary | ICD-10-CM

## 2014-10-12 MED ORDER — POTASSIUM CHLORIDE ER 10 MEQ PO TBCR: EXTENDED_RELEASE_TABLET | ORAL | Status: DC

## 2014-10-12 NOTE — Telephone Encounter (Signed)
Pt aware, refills sent to pharmacy, lab orders mailed to pt. Routed results to pcp

## 2014-10-12 NOTE — Telephone Encounter (Signed)
-----   Message from Herminio Commons, MD sent at 10/11/2014  3:38 PM EDT ----- K low normal. Would take alternating doses of 20 meq and 10 meq on alternate days. Repeat BMET in 2 weeks after doing so.

## 2014-10-25 DIAGNOSIS — E876 Hypokalemia: Secondary | ICD-10-CM | POA: Diagnosis not present

## 2014-10-26 ENCOUNTER — Telehealth: Payer: Self-pay | Admitting: *Deleted

## 2014-10-26 NOTE — Telephone Encounter (Signed)
-----   Message from Herminio Commons, MD sent at 10/25/2014  5:01 PM EDT ----- Ok. K low normal. Would increase consumption of K-rich foods.

## 2014-10-26 NOTE — Telephone Encounter (Signed)
Pt aware, routed to pcp 

## 2014-12-17 DIAGNOSIS — M25562 Pain in left knee: Secondary | ICD-10-CM | POA: Diagnosis not present

## 2014-12-27 DIAGNOSIS — M1712 Unilateral primary osteoarthritis, left knee: Secondary | ICD-10-CM | POA: Diagnosis not present

## 2015-01-16 ENCOUNTER — Other Ambulatory Visit: Payer: Self-pay | Admitting: *Deleted

## 2015-01-16 ENCOUNTER — Other Ambulatory Visit: Payer: Self-pay | Admitting: Cardiovascular Disease

## 2015-01-16 MED ORDER — CHLORTHALIDONE 25 MG PO TABS: 12.5000 mg | ORAL_TABLET | Freq: Every day | ORAL | Status: DC

## 2015-01-18 ENCOUNTER — Encounter: Payer: Self-pay | Admitting: Cardiovascular Disease

## 2015-01-18 ENCOUNTER — Encounter: Payer: Self-pay | Admitting: *Deleted

## 2015-01-18 ENCOUNTER — Telehealth: Payer: Self-pay | Admitting: Cardiovascular Disease

## 2015-01-18 ENCOUNTER — Ambulatory Visit (INDEPENDENT_AMBULATORY_CARE_PROVIDER_SITE_OTHER): Payer: Medicare Other | Admitting: Cardiovascular Disease

## 2015-01-18 VITALS — BP 148/78 | HR 64 | Ht 64.0 in | Wt 186.0 lb

## 2015-01-18 DIAGNOSIS — R5383 Other fatigue: Secondary | ICD-10-CM

## 2015-01-18 DIAGNOSIS — I25118 Atherosclerotic heart disease of native coronary artery with other forms of angina pectoris: Secondary | ICD-10-CM

## 2015-01-18 DIAGNOSIS — I10 Essential (primary) hypertension: Secondary | ICD-10-CM | POA: Diagnosis not present

## 2015-01-18 DIAGNOSIS — N183 Chronic kidney disease, stage 3 unspecified: Secondary | ICD-10-CM

## 2015-01-18 DIAGNOSIS — E876 Hypokalemia: Secondary | ICD-10-CM

## 2015-01-18 DIAGNOSIS — E785 Hyperlipidemia, unspecified: Secondary | ICD-10-CM

## 2015-01-18 DIAGNOSIS — R0602 Shortness of breath: Secondary | ICD-10-CM

## 2015-01-18 DIAGNOSIS — N189 Chronic kidney disease, unspecified: Secondary | ICD-10-CM | POA: Diagnosis not present

## 2015-01-18 DIAGNOSIS — I129 Hypertensive chronic kidney disease with stage 1 through stage 4 chronic kidney disease, or unspecified chronic kidney disease: Secondary | ICD-10-CM | POA: Diagnosis not present

## 2015-01-18 DIAGNOSIS — J438 Other emphysema: Secondary | ICD-10-CM

## 2015-01-18 NOTE — Progress Notes (Signed)
Patient ID: Annette Sharp, female   DOB: May 25, 1940, 74 y.o.   MRN: [id number]      SUBJECTIVE: The patient presents for follow-up of coronary artery disease. For the past several months, she has felt very weak and short of breath. She has felt tired and not felt like getting out of bed. She does not complain of chest pain per se. She underwent a normal nuclear stress test in December 2015. She was told she has the early signs of mild emphysema. She tried taking Crestor for 6 months but developed diffuse body aches and stopped taking it several months ago. She asked about undergoing left knee replacement surgery in the next few months. She is planning on going on vacation over Thanksgiving.   Review of Systems: As per "subjective", otherwise negative.  Allergies  Allergen Reactions  . Ace Inhibitors Cough  . Gemfibrozil Other (See Comments)    REACTION: Muscle Aches  . Statins     REACTION: Muscle Cramps  . Moxifloxacin Rash  . Ranexa [Ranolazine] Other (See Comments)    "NIGHTMARES"    Current Outpatient Prescriptions  Medication Sig Dispense Refill  . acyclovir (ZOVIRAX) 400 MG tablet Take 400 mg by mouth every morning.     Marland Kitchen albuterol (PROVENTIL HFA) 108 (90 BASE) MCG/ACT inhaler Inhale 1 puff into the lungs every 6 (six) hours as needed for shortness of breath. 1 Inhaler 1  . amLODipine (NORVASC) 10 MG tablet Take 10 mg by mouth every morning.     Marland Kitchen aspirin 81 MG tablet Take 81 mg by mouth every morning.     . chlorthalidone (HYGROTON) 25 MG tablet Take 0.5 tablets (12.5 mg total) by mouth daily. 45 tablet 3  . citalopram (CELEXA) 20 MG tablet Take 20 mg by mouth every morning.     . clopidogrel (PLAVIX) 75 MG tablet Take 75 mg by mouth every morning.     . clotrimazole (LOTRIMIN) 1 % cream Apply 1 application topically as needed.    . cyclobenzaprine (FLEXERIL) 5 MG tablet Take 5 mg by mouth 3 (three) times daily as needed for muscle spasms.    . isosorbide mononitrate (IMDUR) 30  MG 24 hr tablet Take 1 tablet (30 mg total) by mouth 2 (two) times daily. 60 tablet 6  . levothyroxine (SYNTHROID, LEVOTHROID) 88 MCG tablet Take 88 mcg by mouth every morning.     Marland Kitchen omeprazole (PRILOSEC) 20 MG capsule Take 20 mg by mouth every morning.     . potassium chloride (K-DUR) 10 MEQ tablet Take 20 meq every other day alternating 10 meq every other day 45 tablet 6   No current facility-administered medications for this visit.    Past Medical History  Diagnosis Date  . Overweight(278.02)     OBESITY  . Hyperlipidemia   . Other and unspecified angina pectoris     ANGINA  . Hypertension   . Back pain   . Peptic ulcer disease   . Thyroid disease     TREATED HYPOTHYROIDISM  . Sleep apnea     SEVERE OBSTRUCTIVE  . 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 Stroke ([place])   . Depression   . Chronic renal insufficiency     GFR 45 ML'S PER MINUTE  . Coronary artery disease     S/P CYPHER DRUG-ELUTING STENTS TO RIGHT CORONARY ARTERY JUNE 2007  . Coronary atherosclerosis of native coronary artery     NEGATIVE LEXISCAN FOR ISCHEMIA  MARCH 23,2010 NORMAL LV FUNCTTION    Past Surgical History  Procedure Laterality Date  . Carpal tunnel release    . Cholecystectomy    . Total abdominal hysterectomy w/ bilateral salpingoophorectomy    . Replacement total knee    . Rotator cuff repair      Social History   Social History  . Marital Status: Married    Spouse Name: N/A  . Number of Children: N/A  . Years of Education: N/A   Occupational History  . Not on file.   Social History Main Topics  . Smoking status: Never Smoker   . Smokeless tobacco: Never Used  . Alcohol Use: No  . Drug Use: No  . Sexual Activity: No   Other Topics Concern  . Not on file   Social History Narrative     Filed Vitals:   01/18/15 1318  BP: 148/78  Pulse: 64  Height: 5\' 4"  (1.626 m)  Weight: 186 lb (84.369 kg)  SpO2: 97%    PHYSICAL  EXAM General: NAD HEENT: Normal. Neck: No JVD, no thyromegaly. Lungs: Clear to auscultation bilaterally with normal respiratory effort. CV: Nondisplaced PMI. Regular rate and rhythm, normal S1/S2, no S3/S4, soft 1/6 pansystolic murmur along left sternal border. No pretibial or periankle edema. Abdomen: Soft, nontender, no distention.  Neurologic: Alert and oriented x 3.  Psych: Normal affect. Skin: Normal. Musculoskeletal: Normal range of motion, no gross deformities. Extremities: No clubbing or cyanosis.    ECG: Most recent ECG reviewed.      ASSESSMENT AND PLAN: 1. CAD with increasing fatigue and shortness of breath: Normal Lexiscan Cardiolite on 02/27/14. Symptoms out of proportion for mild emphysema. Will arrange for right and left heart cardiac catheterization with coronary angiography. Continue ASA, Imdur, and Plavix.  Statin intolerant.  2. Essential HTN: Mildly elevated today. Will monitor. Continue chlorthalidone. I am unable to use ACEI/ARB due to stage 3 CKD.  3. COPD: PRN use of albuterol inhaler.  4. Hyperlipidemia: Lipids on 06/07/14 showed total cholesterol 156, triglycerides 71, HDL 65, LDL 77. No longer on Crestor.  Will check lipids.  5. Sleep apnea: Using CPAP daily.   6. CKD stage 3: BUN 19 and creatinine 1.12 on 10/25/14. No changes to chlorthalidone use. Will check BMET.  7. Weakness/fatigue: See #1. Check CBC and BMET today.  Dispo: f/u after cath.  Kate Sable, M.D., F.A.C.C.

## 2015-01-18 NOTE — Patient Instructions (Signed)
Your physician has requested that you have a cardiac catheterization. Cardiac catheterization is used to diagnose and/or treat various heart conditions. Doctors may recommend this procedure for a number of different reasons. The most common reason is to evaluate chest pain. Chest pain can be a symptom of coronary artery disease (CAD), and cardiac catheterization can show whether plaque is narrowing or blocking your heart's arteries. This procedure is also used to evaluate the valves, as well as measure the blood flow and oxygen levels in different parts of your heart. For further information please visit [email or web address]. Please follow instruction sheet, as given. Labs for BMET, CBC - do today, order given. Office will contact with results via phone or letter.   Follow up will be given at time of discharge.

## 2015-01-18 NOTE — Telephone Encounter (Signed)
Left & right heart cath - Tuesday, 01/23/15 - 10:30 - Dr. Fletcher Anon

## 2015-01-21 NOTE — Telephone Encounter (Signed)
No precert required.  Pt has MCR and [employer] MCR supplement.

## 2015-01-22 ENCOUNTER — Ambulatory Visit (HOSPITAL_COMMUNITY)
Admission: RE | Admit: 2015-01-22 | Discharge: 2015-01-22 | Disposition: A | Discharge status: Home / Self Care | Payer: Medicare Other | Source: Ambulatory Visit | Attending: Cardiovascular Disease | Admitting: Cardiovascular Disease

## 2015-01-22 ENCOUNTER — Encounter (HOSPITAL_COMMUNITY): Payer: Self-pay | Admitting: Cardiovascular Disease

## 2015-01-22 ENCOUNTER — Encounter (HOSPITAL_COMMUNITY)
Admission: RE | Disposition: A | Discharge status: Home / Self Care | Payer: Self-pay | Source: Ambulatory Visit | Attending: Cardiovascular Disease

## 2015-01-22 DIAGNOSIS — Z8673 Personal history of transient ischemic attack (TIA), and cerebral infarction without residual deficits: Secondary | ICD-10-CM | POA: Insufficient documentation

## 2015-01-22 DIAGNOSIS — I2510 Atherosclerotic heart disease of native coronary artery without angina pectoris: Secondary | ICD-10-CM | POA: Insufficient documentation

## 2015-01-22 DIAGNOSIS — F329 Major depressive disorder, single episode, unspecified: Secondary | ICD-10-CM | POA: Diagnosis not present

## 2015-01-22 DIAGNOSIS — J449 Chronic obstructive pulmonary disease, unspecified: Secondary | ICD-10-CM | POA: Insufficient documentation

## 2015-01-22 DIAGNOSIS — Z7982 Long term (current) use of aspirin: Secondary | ICD-10-CM | POA: Insufficient documentation

## 2015-01-22 DIAGNOSIS — I25118 Atherosclerotic heart disease of native coronary artery with other forms of angina pectoris: Secondary | ICD-10-CM | POA: Diagnosis not present

## 2015-01-22 DIAGNOSIS — I129 Hypertensive chronic kidney disease with stage 1 through stage 4 chronic kidney disease, or unspecified chronic kidney disease: Secondary | ICD-10-CM | POA: Insufficient documentation

## 2015-01-22 DIAGNOSIS — E669 Obesity, unspecified: Secondary | ICD-10-CM | POA: Diagnosis not present

## 2015-01-22 DIAGNOSIS — N183 Chronic kidney disease, stage 3 (moderate): Secondary | ICD-10-CM | POA: Diagnosis not present

## 2015-01-22 DIAGNOSIS — Z955 Presence of coronary angioplasty implant and graft: Secondary | ICD-10-CM | POA: Diagnosis not present

## 2015-01-22 DIAGNOSIS — Z7902 Long term (current) use of antithrombotics/antiplatelets: Secondary | ICD-10-CM | POA: Diagnosis not present

## 2015-01-22 DIAGNOSIS — E039 Hypothyroidism, unspecified: Secondary | ICD-10-CM | POA: Insufficient documentation

## 2015-01-22 DIAGNOSIS — E785 Hyperlipidemia, unspecified: Secondary | ICD-10-CM | POA: Diagnosis not present

## 2015-01-22 DIAGNOSIS — R0600 Dyspnea, unspecified: Secondary | ICD-10-CM | POA: Diagnosis not present

## 2015-01-22 HISTORY — PX: CARDIAC CATHETERIZATION: SHX172

## 2015-01-22 LAB — POCT I-STAT 3, ART BLOOD GAS (G3+)
ACID-BASE EXCESS: 4 mmol/L — AB (ref 0.0–2.0)
Bicarbonate: 28.8 mEq/L — ABNORMAL HIGH (ref 20.0–24.0)
O2 SAT: 95 %
PCO2 ART: 44.7 mmHg (ref 35.0–45.0)
TCO2: 30 mmol/L (ref 0–100)
pH, Arterial: 7.417 (ref 7.350–7.450)
pO2, Arterial: 73 mmHg — ABNORMAL LOW (ref 80.0–100.0)

## 2015-01-22 LAB — POCT I-STAT 3, VENOUS BLOOD GAS (G3P V)
ACID-BASE EXCESS: 4 mmol/L — AB (ref 0.0–2.0)
BICARBONATE: 29.3 meq/L — AB (ref 20.0–24.0)
O2 SAT: 63 %
PH VEN: 7.407 — AB (ref 7.250–7.300)
TCO2: 31 mmol/L (ref 0–100)
pCO2, Ven: 46.5 mmHg (ref 45.0–50.0)
pO2, Ven: 33 mmHg (ref 30.0–45.0)

## 2015-01-22 LAB — PROTIME-INR
INR: 1.07 (ref 0.00–1.49)
PROTHROMBIN TIME: 14.1 s (ref 11.6–15.2)

## 2015-01-22 SURGERY — RIGHT/LEFT HEART CATH AND CORONARY ANGIOGRAPHY
Anesthesia: LOCAL

## 2015-01-22 MED ORDER — SODIUM CHLORIDE 0.9 % IJ SOLN: 3.0000 mL | INTRAMUSCULAR | Status: DC | PRN

## 2015-01-22 MED ORDER — VERAPAMIL HCL 2.5 MG/ML IV SOLN
INTRAVENOUS | Status: DC | PRN
  Administered 2015-01-22: 11:00:00 via INTRA_ARTERIAL

## 2015-01-22 MED ORDER — SODIUM CHLORIDE 0.9 % IV SOLN: 250.0000 mL | INTRAVENOUS | Status: DC | PRN

## 2015-01-22 MED ORDER — FENTANYL CITRATE (PF) 100 MCG/2ML IJ SOLN
INTRAMUSCULAR | Status: DC | PRN
  Administered 2015-01-22 (×2): 25 ug via INTRAVENOUS

## 2015-01-22 MED ORDER — SODIUM CHLORIDE 0.9 % WEIGHT BASED INFUSION: 3.0000 mL/kg/h | INTRAVENOUS | Status: DC

## 2015-01-22 MED ORDER — SODIUM CHLORIDE 0.9 % WEIGHT BASED INFUSION
3.0000 mL/kg/h | INTRAVENOUS | Status: AC
  Administered 2015-01-22: 3 mL/kg/h via INTRAVENOUS

## 2015-01-22 MED ORDER — HEPARIN SODIUM (PORCINE) 1000 UNIT/ML IJ SOLN
INTRAMUSCULAR | Status: AC
  Filled 2015-01-22: qty 1

## 2015-01-22 MED ORDER — SODIUM CHLORIDE 0.9 % WEIGHT BASED INFUSION: 1.0000 mL/kg/h | INTRAVENOUS | Status: DC

## 2015-01-22 MED ORDER — SODIUM CHLORIDE 0.9 % IJ SOLN: 3.0000 mL | Freq: Two times a day (BID) | INTRAMUSCULAR | Status: DC

## 2015-01-22 MED ORDER — HEPARIN SODIUM (PORCINE) 1000 UNIT/ML IJ SOLN
INTRAMUSCULAR | Status: DC | PRN
  Administered 2015-01-22: 4000 [IU] via INTRAVENOUS

## 2015-01-22 MED ORDER — IOHEXOL 350 MG/ML SOLN
INTRAVENOUS | Status: DC | PRN
  Administered 2015-01-22: 55 mL via INTRA_ARTERIAL

## 2015-01-22 MED ORDER — VERAPAMIL HCL 2.5 MG/ML IV SOLN
INTRAVENOUS | Status: AC
  Filled 2015-01-22: qty 2

## 2015-01-22 MED ORDER — FENTANYL CITRATE (PF) 100 MCG/2ML IJ SOLN
INTRAMUSCULAR | Status: AC
  Filled 2015-01-22: qty 4

## 2015-01-22 MED ORDER — HEPARIN (PORCINE) IN NACL 2-0.9 UNIT/ML-% IJ SOLN
INTRAMUSCULAR | Status: AC
  Filled 2015-01-22: qty 1500

## 2015-01-22 MED ORDER — MIDAZOLAM HCL 2 MG/2ML IJ SOLN
INTRAMUSCULAR | Status: AC
  Filled 2015-01-22: qty 4

## 2015-01-22 MED ORDER — LIDOCAINE HCL (PF) 1 % IJ SOLN
INTRAMUSCULAR | Status: AC
  Filled 2015-01-22: qty 30

## 2015-01-22 MED ORDER — MIDAZOLAM HCL 2 MG/2ML IJ SOLN
INTRAMUSCULAR | Status: DC | PRN
  Administered 2015-01-22 (×2): 1 mg via INTRAVENOUS

## 2015-01-22 MED ORDER — HEPARIN (PORCINE) IN NACL 2-0.9 UNIT/ML-% IJ SOLN
INTRAMUSCULAR | Status: DC | PRN
  Administered 2015-01-22: 11:00:00

## 2015-01-22 MED ORDER — ASPIRIN 81 MG PO CHEW: 81.0000 mg | CHEWABLE_TABLET | ORAL | Status: DC

## 2015-01-22 SURGICAL SUPPLY — 17 items
CATH BALLN WEDGE 5F 110CM (CATHETERS) ×1 IMPLANT
CATH INFINITI 5 FR JL3.5 (CATHETERS) ×1 IMPLANT
CATH INFINITI 5FR ANG PIGTAIL (CATHETERS) ×2 IMPLANT
CATH OPTITORQUE JACKY 4.0 5F (CATHETERS) ×2 IMPLANT
DEVICE RAD COMP TR BAND LRG (VASCULAR PRODUCTS) ×2 IMPLANT
GLIDESHEATH SLEND SS 6F .021 (SHEATH) ×2 IMPLANT
GUIDEWIRE .025 260CM (WIRE) ×1 IMPLANT
KIT HEART LEFT (KITS) ×2 IMPLANT
KIT HEART RIGHT NAMIC (KITS) ×2 IMPLANT
PACK CARDIAC CATHETERIZATION (CUSTOM PROCEDURE TRAY) ×2 IMPLANT
SHEATH FAST CATH BRACH 5F 5CM (SHEATH) ×1 IMPLANT
SYR MEDRAD MARK V 150ML (SYRINGE) ×2 IMPLANT
TRANSDUCER W/STOPCOCK (MISCELLANEOUS) ×4 IMPLANT
TUBING CIL FLEX 10 FLL-RA (TUBING) ×2 IMPLANT
WIRE EMERALD 3MM-J .025X260CM (WIRE) ×1 IMPLANT
WIRE HI TORQ VERSACORE-J 145CM (WIRE) ×1 IMPLANT
WIRE SAFE-T 1.5MM-J .035X260CM (WIRE) ×2 IMPLANT

## 2015-01-22 NOTE — H&P (View-Only) (Signed)
Patient ID: Annette Sharp, female   DOB: [birth year], 74 y.o.   MRN: [id number]      SUBJECTIVE: The patient presents for follow-up of coronary artery disease. For the past several months, she has felt very weak and short of breath. She has felt tired and not felt like getting out of bed. She does not complain of chest pain per se. She underwent a normal nuclear stress test in December 2015. She was told she has the early signs of mild emphysema. She tried taking Crestor for 6 months but developed diffuse body aches and stopped taking it several months ago. She asked about undergoing left knee replacement surgery in the next few months. She is planning on going on vacation over Thanksgiving.   Review of Systems: As per "subjective", otherwise negative.  Allergies  Allergen Reactions  . Ace Inhibitors Cough  . Gemfibrozil Other (See Comments)    REACTION: Muscle Aches  . Statins     REACTION: Muscle Cramps  . Moxifloxacin Rash  . Ranexa [Ranolazine] Other (See Comments)    "NIGHTMARES"    Current Outpatient Prescriptions  Medication Sig Dispense Refill  . acyclovir (ZOVIRAX) 400 MG tablet Take 400 mg by mouth every morning.     Marland Kitchen albuterol (PROVENTIL HFA) 108 (90 BASE) MCG/ACT inhaler Inhale 1 puff into the lungs every 6 (six) hours as needed for shortness of breath. 1 Inhaler 1  . amLODipine (NORVASC) 10 MG tablet Take 10 mg by mouth every morning.     Marland Kitchen aspirin 81 MG tablet Take 81 mg by mouth every morning.     . chlorthalidone (HYGROTON) 25 MG tablet Take 0.5 tablets (12.5 mg total) by mouth daily. 45 tablet 3  . citalopram (CELEXA) 20 MG tablet Take 20 mg by mouth every morning.     . clopidogrel (PLAVIX) 75 MG tablet Take 75 mg by mouth every morning.     . clotrimazole (LOTRIMIN) 1 % cream Apply 1 application topically as needed.    . cyclobenzaprine (FLEXERIL) 5 MG tablet Take 5 mg by mouth 3 (three) times daily as needed for muscle spasms.    . isosorbide mononitrate (IMDUR) 30  MG 24 hr tablet Take 1 tablet (30 mg total) by mouth 2 (two) times daily. 60 tablet 6  . levothyroxine (SYNTHROID, LEVOTHROID) 88 MCG tablet Take 88 mcg by mouth every morning.     Marland Kitchen omeprazole (PRILOSEC) 20 MG capsule Take 20 mg by mouth every morning.     . potassium chloride (K-DUR) 10 MEQ tablet Take 20 meq every other day alternating 10 meq every other day 45 tablet 6   No current facility-administered medications for this visit.    Past Medical History  Diagnosis Date  . Overweight(278.02)     OBESITY  . Hyperlipidemia   . Other and unspecified angina pectoris     ANGINA  . Hypertension   . Back pain   . Peptic ulcer disease   . Thyroid disease     TREATED HYPOTHYROIDISM  . Sleep apnea     SEVERE OBSTRUCTIVE  . 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 Stroke ([place])   . Depression   . Chronic renal insufficiency     GFR 45 ML'S PER MINUTE  . Coronary artery disease     S/P CYPHER DRUG-ELUTING STENTS TO RIGHT CORONARY ARTERY JUNE 2007  . Coronary atherosclerosis of native coronary artery     NEGATIVE LEXISCAN FOR ISCHEMIA  MARCH 23,2010 NORMAL LV FUNCTTION    Past Surgical History  Procedure Laterality Date  . Carpal tunnel release    . Cholecystectomy    . Total abdominal hysterectomy w/ bilateral salpingoophorectomy    . Replacement total knee    . Rotator cuff repair      Social History   Social History  . Marital Status: Married    Spouse Name: N/A  . Number of Children: N/A  . Years of Education: N/A   Occupational History  . Not on file.   Social History Main Topics  . Smoking status: Never Smoker   . Smokeless tobacco: Never Used  . Alcohol Use: No  . Drug Use: No  . Sexual Activity: No   Other Topics Concern  . Not on file   Social History Narrative     Filed Vitals:   01/18/15 1318  BP: 148/78  Pulse: 64  Height: 5\' 4"  (1.626 m)  Weight: 186 lb (84.369 kg)  SpO2: 97%    PHYSICAL  EXAM General: NAD HEENT: Normal. Neck: No JVD, no thyromegaly. Lungs: Clear to auscultation bilaterally with normal respiratory effort. CV: Nondisplaced PMI. Regular rate and rhythm, normal S1/S2, no S3/S4, soft 1/6 pansystolic murmur along left sternal border. No pretibial or periankle edema. Abdomen: Soft, nontender, no distention.  Neurologic: Alert and oriented x 3.  Psych: Normal affect. Skin: Normal. Musculoskeletal: Normal range of motion, no gross deformities. Extremities: No clubbing or cyanosis.    ECG: Most recent ECG reviewed.      ASSESSMENT AND PLAN: 1. CAD with increasing fatigue and shortness of breath: Normal Lexiscan Cardiolite on 02/27/14. Symptoms out of proportion for mild emphysema. Will arrange for right and left heart cardiac catheterization with coronary angiography. Continue ASA, Imdur, and Plavix.  Statin intolerant.  2. Essential HTN: Mildly elevated today. Will monitor. Continue chlorthalidone. I am unable to use ACEI/ARB due to stage 3 CKD.  3. COPD: PRN use of albuterol inhaler.  4. Hyperlipidemia: Lipids on 06/07/14 showed total cholesterol 156, triglycerides 71, HDL 65, LDL 77. No longer on Crestor.  Will check lipids.  5. Sleep apnea: Using CPAP daily.   6. CKD stage 3: BUN 19 and creatinine 1.12 on 10/25/14. No changes to chlorthalidone use. Will check BMET.  7. Weakness/fatigue: See #1. Check CBC and BMET today.  Dispo: f/u after cath.  Kate Sable, M.D., F.A.C.C.

## 2015-01-22 NOTE — Discharge Instructions (Signed)
Radial Site Care °Refer to this sheet in the next few weeks. These instructions provide you with information about caring for yourself after your procedure. Your health care provider may also give you more specific instructions. Your treatment has been planned according to current medical practices, but problems sometimes occur. Call your health care provider if you have any problems or questions after your procedure. °WHAT TO EXPECT AFTER THE PROCEDURE °After your procedure, it is typical to have the following: °· Bruising at the radial site that usually fades within 1-2 weeks. °· Blood collecting in the tissue (hematoma) that may be painful to the touch. It should usually decrease in size and tenderness within 1-2 weeks. °HOME CARE INSTRUCTIONS °· Take medicines only as directed by your health care provider. °· You may shower 24-48 hours after the procedure or as directed by your health care provider. Remove the bandage (dressing) and gently wash the site with plain soap and water. Pat the area dry with a clean towel. Do not rub the site, because this may cause bleeding. °· Do not take baths, swim, or use a hot tub until your health care provider approves. °· Check your insertion site every day for redness, swelling, or drainage. °· Do not apply powder or lotion to the site. °· Do not flex or bend the affected arm for 24 hours or as directed by your health care provider. °· Do not push or pull heavy objects with the affected arm for 24 hours or as directed by your health care provider. °· Do not lift over 10 lb (4.5 kg) for 5 days after your procedure or as directed by your health care provider. °· Ask your health care provider when it is okay to: °¨ Return to work or school. °¨ Resume usual physical activities or sports. °¨ Resume sexual activity. °· Do not drive home if you are discharged the same day as the procedure. Have someone else drive you. °· You may drive 24 hours after the procedure unless otherwise  instructed by your health care provider. °· Do not operate machinery or power tools for 24 hours after the procedure. °· If your procedure was done as an outpatient procedure, which means that you went home the same day as your procedure, a responsible adult should be with you for the first 24 hours after you arrive home. °· Keep all follow-up visits as directed by your health care provider. This is important. °SEEK MEDICAL CARE IF: °· You have a fever. °· You have chills. °· You have increased bleeding from the radial site. Hold pressure on the site. °SEEK IMMEDIATE MEDICAL CARE IF: °· You have unusual pain at the radial site. °· You have redness, warmth, or swelling at the radial site. °· You have drainage (other than a small amount of blood on the dressing) from the radial site. °· The radial site is bleeding, and the bleeding does not stop after 30 minutes of holding steady pressure on the site. °· Your arm or hand becomes pale, cool, tingly, or numb. °  °This information is not intended to replace advice given to you by your health care provider. Make sure you discuss any questions you have with your health care provider. °  °Document Released: 04/18/2010 Document Revised: 04/06/2014 Document Reviewed: 10/02/2013 °Elsevier Interactive Patient Education ©2016 [employer] ° °

## 2015-01-22 NOTE — Interval H&P Note (Signed)
History and Physical Interval Note:  01/22/2015 10:16 AM  Annette Sharp  has presented today for surgery, with the diagnosis of cad, sob, angina pectoris  The various methods of treatment have been discussed with the patient and family. After consideration of risks, benefits and other options for treatment, the patient has consented to  Procedure(s): Right/Left Heart Cath and Coronary Angiography (N/A) as a surgical intervention .  The patient's history has been reviewed, patient examined, no change in status, stable for surgery.  I have reviewed the patient's chart and labs.  Questions were answered to the patient's satisfaction.     Kathlyn [place]

## 2015-01-25 ENCOUNTER — Emergency Department (HOSPITAL_COMMUNITY)
Admission: EM | Admit: 2015-01-25 | Discharge: 2015-01-25 | Disposition: A | Discharge status: Home / Self Care | Payer: Medicare Other | Attending: Emergency Medicine | Admitting: Emergency Medicine

## 2015-01-25 ENCOUNTER — Emergency Department (HOSPITAL_COMMUNITY): Payer: Medicare Other

## 2015-01-25 ENCOUNTER — Encounter (HOSPITAL_COMMUNITY): Payer: Self-pay | Admitting: *Deleted

## 2015-01-25 DIAGNOSIS — G44209 Tension-type headache, unspecified, not intractable: Secondary | ICD-10-CM | POA: Diagnosis not present

## 2015-01-25 DIAGNOSIS — I25119 Atherosclerotic heart disease of native coronary artery with unspecified angina pectoris: Secondary | ICD-10-CM | POA: Insufficient documentation

## 2015-01-25 DIAGNOSIS — Z79899 Other long term (current) drug therapy: Secondary | ICD-10-CM | POA: Diagnosis not present

## 2015-01-25 DIAGNOSIS — Z7902 Long term (current) use of antithrombotics/antiplatelets: Secondary | ICD-10-CM | POA: Insufficient documentation

## 2015-01-25 DIAGNOSIS — Z8673 Personal history of transient ischemic attack (TIA), and cerebral infarction without residual deficits: Secondary | ICD-10-CM | POA: Insufficient documentation

## 2015-01-25 DIAGNOSIS — Z8711 Personal history of peptic ulcer disease: Secondary | ICD-10-CM | POA: Insufficient documentation

## 2015-01-25 DIAGNOSIS — Z96659 Presence of unspecified artificial knee joint: Secondary | ICD-10-CM | POA: Insufficient documentation

## 2015-01-25 DIAGNOSIS — N189 Chronic kidney disease, unspecified: Secondary | ICD-10-CM | POA: Insufficient documentation

## 2015-01-25 DIAGNOSIS — R51 Headache: Secondary | ICD-10-CM | POA: Diagnosis not present

## 2015-01-25 DIAGNOSIS — E663 Overweight: Secondary | ICD-10-CM | POA: Diagnosis not present

## 2015-01-25 DIAGNOSIS — F329 Major depressive disorder, single episode, unspecified: Secondary | ICD-10-CM | POA: Diagnosis not present

## 2015-01-25 DIAGNOSIS — E876 Hypokalemia: Secondary | ICD-10-CM | POA: Diagnosis not present

## 2015-01-25 DIAGNOSIS — I129 Hypertensive chronic kidney disease with stage 1 through stage 4 chronic kidney disease, or unspecified chronic kidney disease: Secondary | ICD-10-CM | POA: Diagnosis not present

## 2015-01-25 DIAGNOSIS — Z9889 Other specified postprocedural states: Secondary | ICD-10-CM | POA: Diagnosis not present

## 2015-01-25 DIAGNOSIS — E039 Hypothyroidism, unspecified: Secondary | ICD-10-CM | POA: Diagnosis not present

## 2015-01-25 DIAGNOSIS — Z7982 Long term (current) use of aspirin: Secondary | ICD-10-CM | POA: Insufficient documentation

## 2015-01-25 DIAGNOSIS — S0990XA Unspecified injury of head, initial encounter: Secondary | ICD-10-CM | POA: Diagnosis not present

## 2015-01-25 DIAGNOSIS — S14109A Unspecified injury at unspecified level of cervical spinal cord, initial encounter: Secondary | ICD-10-CM | POA: Diagnosis not present

## 2015-01-25 LAB — CBC WITH DIFFERENTIAL/PLATELET
BASOS ABS: 0 10*3/uL (ref 0.0–0.1)
Basophils Relative: 1 %
EOS ABS: 0.2 10*3/uL (ref 0.0–0.7)
EOS PCT: 2 %
HCT: 37.8 % (ref 36.0–46.0)
HEMOGLOBIN: 13.7 g/dL (ref 12.0–15.0)
LYMPHS ABS: 2.1 10*3/uL (ref 0.7–4.0)
LYMPHS PCT: 25 %
MCH: 32.2 pg (ref 26.0–34.0)
MCHC: 36.2 g/dL — ABNORMAL HIGH (ref 30.0–36.0)
MCV: 88.9 fL (ref 78.0–100.0)
Monocytes Absolute: 1.1 10*3/uL — ABNORMAL HIGH (ref 0.1–1.0)
Monocytes Relative: 13 %
NEUTROS PCT: 59 %
Neutro Abs: 5.1 10*3/uL (ref 1.7–7.7)
PLATELETS: 277 10*3/uL (ref 150–400)
RBC: 4.25 MIL/uL (ref 3.87–5.11)
RDW: 12.9 % (ref 11.5–15.5)
WBC: 8.4 10*3/uL (ref 4.0–10.5)

## 2015-01-25 LAB — BASIC METABOLIC PANEL
Anion gap: 9 (ref 5–15)
BUN: 16 mg/dL (ref 6–20)
CALCIUM: 9.3 mg/dL (ref 8.9–10.3)
CHLORIDE: 98 mmol/L — AB (ref 101–111)
CO2: 29 mmol/L (ref 22–32)
CREATININE: 1.26 mg/dL — AB (ref 0.44–1.00)
GFR calc Af Amer: 48 mL/min — ABNORMAL LOW (ref >60)
GFR calc non Af Amer: 42 mL/min — ABNORMAL LOW (ref >60)
Glucose, Bld: 108 mg/dL — ABNORMAL HIGH (ref 65–99)
Potassium: 2.6 mmol/L — CL (ref 3.5–5.1)
SODIUM: 136 mmol/L (ref 135–145)

## 2015-01-25 LAB — POTASSIUM: POTASSIUM: 3.6 mmol/L (ref 3.5–5.1)

## 2015-01-25 LAB — MAGNESIUM: Magnesium: 1.9 mg/dL (ref 1.7–2.4)

## 2015-01-25 MED ORDER — POTASSIUM CHLORIDE 10 MEQ/100ML IV SOLN
10.0000 meq | INTRAVENOUS | Status: AC
  Administered 2015-01-25 (×2): 10 meq via INTRAVENOUS
  Filled 2015-01-25 (×2): qty 100

## 2015-01-25 MED ORDER — POTASSIUM CHLORIDE CRYS ER 20 MEQ PO TBCR
40.0000 meq | EXTENDED_RELEASE_TABLET | Freq: Once | ORAL | Status: AC
  Administered 2015-01-25: 40 meq via ORAL
  Filled 2015-01-25: qty 2

## 2015-01-25 MED ORDER — HYDROCODONE-ACETAMINOPHEN 5-325 MG PO TABS
2.0000 | ORAL_TABLET | Freq: Once | ORAL | Status: AC
  Administered 2015-01-25: 2 via ORAL
  Filled 2015-01-25: qty 2

## 2015-01-25 MED ORDER — POTASSIUM CHLORIDE ER 10 MEQ PO TBCR: 20.0000 meq | EXTENDED_RELEASE_TABLET | Freq: Every day | ORAL | Status: DC

## 2015-01-25 NOTE — ED Notes (Signed)
D/c papers and prescription given and reviewed. Patient verbalized understanding. Patient ambulatory out of dept. Without difficulty.

## 2015-01-25 NOTE — ED Provider Notes (Signed)
CSN: [id number]     Arrival date & time 01/25/15  1002 History   First MD Initiated Contact with Patient 01/25/15 1008     Chief Complaint  Patient presents with  . Headache     (Consider location/radiation/quality/duration/timing/severity/associated sxs/prior Treatment) Patient is a 74 y.o. female presenting with headaches. The history is provided by the patient. No language interpreter was used.  Headache Pain location:  Occipital Quality:  Dull Radiates to:  Does not radiate Severity currently:  5/10 Severity at highest:  5/10 Onset quality:  Sudden Timing:  Constant Progression:  Worsening Chronicity:  New Similar to prior headaches: no   Relieved by:  Nothing Worsened by:  Nothing Ineffective treatments:  None tried Associated symptoms: no back pain   Risk factors: no anger     Past Medical History  Diagnosis Date  . Overweight(278.02)     OBESITY  . Hyperlipidemia   . Other and unspecified angina pectoris     ANGINA  . Hypertension   . Back pain   . Peptic ulcer disease   . Thyroid disease     TREATED HYPOTHYROIDISM  . Sleep apnea     SEVERE OBSTRUCTIVE  . 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 Stroke ([place])   . Depression   . Chronic renal insufficiency     GFR 45 ML'S PER MINUTE  . Coronary artery disease     S/P CYPHER DRUG-ELUTING STENTS TO RIGHT CORONARY ARTERY JUNE 2007  . Coronary atherosclerosis of native coronary artery     NEGATIVE LEXISCAN FOR ISCHEMIA MARCH 23,2010 NORMAL LV FUNCTTION   Past Surgical History  Procedure Laterality Date  . Carpal tunnel release    . Cholecystectomy    . Total abdominal hysterectomy w/ bilateral salpingoophorectomy    . Replacement total knee    . Rotator cuff repair    . Cardiac catheterization N/A 01/22/2015    Procedure: Right/Left Heart Cath and Coronary Angiography;  Surgeon: Wellington Hampshire, MD;  Location: [facility];  Service: Cardiovascular;   Laterality: N/A;   Family History  Problem Relation Age of Onset  . CAD    . Hypertension    . Heart attack    . Cancer     Social History  Substance Use Topics  . Smoking status: Never Smoker   . Smokeless tobacco: Never Used  . Alcohol Use: No   OB History    Gravida Para Term Preterm AB TAB SAB Ectopic Multiple Living            2     Review of Systems  Musculoskeletal: Negative for back pain.  Neurological: Positive for headaches.  All other systems reviewed and are negative. Pt reports she fell last week and hit her head on concrete. Pt hit head Friday the 21.   Pt had a heart cath done on Tuesday.   Pt reports headache yesterday.   Pt reports noticed some soreness in her neck during cardiac cath.    Allergies  Ace inhibitors; Gemfibrozil; Statins; Moxifloxacin; and Ranexa  Home Medications   Prior to Admission medications   Medication Sig Start Date End Date Taking? Authorizing Provider  acyclovir (ZOVIRAX) 400 MG tablet Take 400 mg by mouth every morning.    Yes Historical Provider, MD  albuterol (PROVENTIL HFA) 108 (90 BASE) MCG/ACT inhaler Inhale 1 puff into the lungs every 6 (six) hours as needed for shortness of breath. 01/26/13  Yes  Herminio Commons, MD  amLODipine (NORVASC) 10 MG tablet Take 10 mg by mouth every morning.    Yes Donney Dice, PA-C  aspirin 81 MG tablet Take 81 mg by mouth every morning.    Yes Historical Provider, MD  betamethasone valerate (VALISONE) 0.1 % cream Apply 1 application topically 2 (two) times daily.   Yes Historical Provider, MD  chlorthalidone (HYGROTON) 25 MG tablet Take 0.5 tablets (12.5 mg total) by mouth daily. 01/16/15  Yes Herminio Commons, MD  citalopram (CELEXA) 20 MG tablet Take 20 mg by mouth every morning.    Yes Historical Provider, MD  clopidogrel (PLAVIX) 75 MG tablet Take 75 mg by mouth every morning.    Yes Historical Provider, MD  clotrimazole (LOTRIMIN) 1 % cream Apply 1 application topically as needed.  For rash   Yes Historical Provider, MD  cyclobenzaprine (FLEXERIL) 5 MG tablet Take 5 mg by mouth 3 (three) times daily as needed for muscle spasms.   Yes Historical Provider, MD  HYDROcodone-acetaminophen (NORCO/VICODIN) 5-325 MG tablet Take 1 tablet by mouth every 6 (six) hours as needed for moderate pain.   Yes Historical Provider, MD  isosorbide mononitrate (IMDUR) 30 MG 24 hr tablet Take 1 tablet (30 mg total) by mouth 2 (two) times daily. 03/26/14  Yes Herminio Commons, MD  levothyroxine (SYNTHROID, LEVOTHROID) 88 MCG tablet Take 88 mcg by mouth every morning.    Yes Historical Provider, MD  omeprazole (PRILOSEC) 20 MG capsule Take 20 mg by mouth every morning.    Yes Historical Provider, MD  potassium chloride (K-DUR) 10 MEQ tablet Take 20 meq every other day alternating 10 meq every other day 10/12/14  Yes Herminio Commons, MD   BP 135/54 mmHg  Pulse 58  Temp(Src) 98.4 F (36.9 C) (Oral)  Resp 19  Ht 5\' 2"  (1.575 m)  Wt 186 lb (84.369 kg)  BMI 34.01 kg/m2  SpO2 100% Physical Exam  Constitutional: She is oriented to person, place, and time. She appears well-developed and well-nourished.  HENT:  Head: Normocephalic and atraumatic.  Eyes: Conjunctivae and EOM are normal. Pupils are equal, round, and reactive to light.  Neck: Normal range of motion.  Cardiovascular: Normal rate, regular rhythm and normal heart sounds.   Pulmonary/Chest: Effort normal.  Abdominal: She exhibits no distension.  Musculoskeletal: Normal range of motion.  Neurological: She is alert and oriented to person, place, and time.  Skin: Skin is warm.  Psychiatric: She has a normal mood and affect.  Nursing note and vitals reviewed.   ED Course  Procedures (including critical care time) Labs Review Labs Reviewed  CBC WITH DIFFERENTIAL/PLATELET - Abnormal; Notable for the following:    MCHC 36.2 (*)    Monocytes Absolute 1.1 (*)    All other components within normal limits  BASIC METABOLIC PANEL -  Abnormal; Notable for the following:    Potassium 2.6 (*)    Chloride 98 (*)    Glucose, Bld 108 (*)    Creatinine, Ser 1.26 (*)    GFR calc non Af Amer 42 (*)    GFR calc Af Amer 48 (*)    All other components within normal limits  MAGNESIUM    Imaging Review Ct Head Wo Contrast  01/25/2015  CLINICAL DATA:  Status post fall.  Headaches. EXAM: CT HEAD WITHOUT CONTRAST CT CERVICAL SPINE WITHOUT CONTRAST TECHNIQUE: Multidetector CT imaging of the head and cervical spine was performed following the standard protocol without intravenous contrast. Multiplanar CT  image reconstructions of the cervical spine were also generated. COMPARISON:  08/03/2014 FINDINGS: CT HEAD FINDINGS There is no evidence of mass effect, midline shift, or extra-axial fluid collections. There is no evidence of a space-occupying lesion or intracranial hemorrhage. There is no evidence of a cortical-based area of acute infarction. There is an old left cerebellar infarct with encephalomalacia. There is generalized cerebral atrophy. There is periventricular white matter low attenuation likely secondary to microangiopathy. The ventricles and sulci are appropriate for the patient's age. The basal cisterns are patent. Visualized portions of the orbits are unremarkable. The visualized portions of the paranasal sinuses and mastoid air cells are unremarkable. Cerebrovascular atherosclerotic calcifications are noted. The osseous structures are unremarkable. CT CERVICAL SPINE FINDINGS The alignment is anatomic. The vertebral body heights are maintained. There is no acute fracture. There is no static listhesis. The prevertebral soft tissues are normal. The intraspinal soft tissues are not fully imaged on this examination due to poor soft tissue contrast, but there is no gross soft tissue abnormality. There is mild degenerative disc disease at C5-6 with mild disc space narrowing. There is a broad-based disc osteophyte complex eccentric towards the  right with bilateral uncovertebral degenerative changes and mild right facet arthropathy resulting in moderate right foraminal stenosis. Mild bilateral facet arthropathy at C7-T1 and T1-2. The visualized portions of the lung apices demonstrate no focal abnormality. There is bilateral carotid artery atherosclerosis. IMPRESSION: 1. No acute intracranial pathology. 2. No acute osseous injury of the cervical spine. Electronically Signed   By: Kathreen Devoid   On: 01/25/2015 12:04   Ct Cervical Spine Wo Contrast  01/25/2015  CLINICAL DATA:  Status post fall.  Headaches. EXAM: CT HEAD WITHOUT CONTRAST CT CERVICAL SPINE WITHOUT CONTRAST TECHNIQUE: Multidetector CT imaging of the head and cervical spine was performed following the standard protocol without intravenous contrast. Multiplanar CT image reconstructions of the cervical spine were also generated. COMPARISON:  08/03/2014 FINDINGS: CT HEAD FINDINGS There is no evidence of mass effect, midline shift, or extra-axial fluid collections. There is no evidence of a space-occupying lesion or intracranial hemorrhage. There is no evidence of a cortical-based area of acute infarction. There is an old left cerebellar infarct with encephalomalacia. There is generalized cerebral atrophy. There is periventricular white matter low attenuation likely secondary to microangiopathy. The ventricles and sulci are appropriate for the patient's age. The basal cisterns are patent. Visualized portions of the orbits are unremarkable. The visualized portions of the paranasal sinuses and mastoid air cells are unremarkable. Cerebrovascular atherosclerotic calcifications are noted. The osseous structures are unremarkable. CT CERVICAL SPINE FINDINGS The alignment is anatomic. The vertebral body heights are maintained. There is no acute fracture. There is no static listhesis. The prevertebral soft tissues are normal. The intraspinal soft tissues are not fully imaged on this examination due to  poor soft tissue contrast, but there is no gross soft tissue abnormality. There is mild degenerative disc disease at C5-6 with mild disc space narrowing. There is a broad-based disc osteophyte complex eccentric towards the right with bilateral uncovertebral degenerative changes and mild right facet arthropathy resulting in moderate right foraminal stenosis. Mild bilateral facet arthropathy at C7-T1 and T1-2. The visualized portions of the lung apices demonstrate no focal abnormality. There is bilateral carotid artery atherosclerosis. IMPRESSION: 1. No acute intracranial pathology. 2. No acute osseous injury of the cervical spine. Electronically Signed   By: Kathreen Devoid   On: 01/25/2015 12:04   I have personally reviewed and evaluated these images and  lab results as part of my medical decision-making.   EKG Interpretation None      MDM  Dr. Wyvonnia Dusky in to see.  Pt has low potassium.  Dr. Wyvonnia Dusky advised 38meq IV and po potassium.   Ct head and Ct neck show no acute injury.   Pt given rx of potassium   Final diagnoses:  Tension-type headache, not intractable, unspecified chronicity pattern  Hypokalemia        Fransico Meadow, PA-C 01/25/15 [place], MD 01/26/15 [phone]

## 2015-01-25 NOTE — Discharge Instructions (Signed)
Hypokalemia Hypokalemia means that the amount of potassium in the blood is lower than normal.Potassium is a chemical, called an electrolyte, that helps regulate the amount of fluid in the body. It also stimulates muscle contraction and helps nerves function properly.Most of the body's potassium is inside of cells, and only a very small amount is in the blood. Because the amount in the blood is so small, minor changes can be life-threatening. CAUSES  Antibiotics.  Diarrhea or vomiting.  Using laxatives too much, which can cause diarrhea.  Chronic kidney disease.  Water pills (diuretics).  Eating disorders (bulimia).  Low magnesium level.  Sweating a lot. SIGNS AND SYMPTOMS  Weakness.  Constipation.  Fatigue.  Muscle cramps.  Mental confusion.  Skipped heartbeats or irregular heartbeat (palpitations).  Tingling or numbness. DIAGNOSIS  Your health care provider can diagnose hypokalemia with blood tests. In addition to checking your potassium level, your health care provider may also check other lab tests. TREATMENT Hypokalemia can be treated with potassium supplements taken by mouth or adjustments in your current medicines. If your potassium level is very low, you may need to get potassium through a vein (IV) and be monitored in the hospital. A diet high in potassium is also helpful. Foods high in potassium are:  Nuts, such as peanuts and pistachios.  Seeds, such as sunflower seeds and pumpkin seeds.  Peas, lentils, and lima beans.  Whole grain and bran cereals and breads.  Fresh fruit and vegetables, such as apricots, avocado, bananas, cantaloupe, kiwi, oranges, tomatoes, asparagus, and potatoes.  Orange and tomato juices.  Red meats.  Fruit yogurt. HOME CARE INSTRUCTIONS  Take all medicines as prescribed by your health care provider.  Maintain a healthy diet by including nutritious food, such as fruits, vegetables, nuts, whole grains, and lean meats.  If  you are taking a laxative, be sure to follow the directions on the label. SEEK MEDICAL CARE IF:  Your weakness gets worse.  You feel your heart pounding or racing.  You are vomiting or having diarrhea.  You are diabetic and having trouble keeping your blood glucose in the normal range. SEEK IMMEDIATE MEDICAL CARE IF:  You have chest pain, shortness of breath, or dizziness.  You are vomiting or having diarrhea for more than 2 days.  You faint. MAKE SURE YOU:   Understand these instructions.  Will watch your condition.  Will get help right away if you are not doing well or get worse.   This information is not intended to replace advice given to you by your health care provider. Make sure you discuss any questions you have with your health care provider.   Document Released: 03/16/2005 Document Revised: 04/06/2014 Document Reviewed: 09/16/2012 Elsevier Interactive Patient Education 2016 [place] Headache Without Cause A headache is pain or discomfort felt around the head or neck area. The specific cause of a headache may not be found. There are many causes and types of headaches. A few common ones are:  Tension headaches.  Migraine headaches.  Cluster headaches.  Chronic daily headaches. HOME CARE INSTRUCTIONS  Watch your condition for any changes. Take these steps to help with your condition: Managing Pain  Take over-the-counter and prescription medicines only as told by your health care provider.  Lie down in a dark, quiet room when you have a headache.  If directed, apply ice to the head and neck area:  Put ice in a plastic bag.  Place a towel between your skin and the bag.  Leave the ice on for 20 minutes, 2-3 times per day.  Use a heating pad or hot shower to apply heat to the head and neck area as told by your health care provider.  Keep lights dim if bright lights bother you or make your headaches worse. Eating and Drinking  Eat meals on  a regular schedule.  Limit alcohol use.  Decrease the amount of caffeine you drink, or stop drinking caffeine. General Instructions  Keep all follow-up visits as told by your health care provider. This is important.  Keep a headache journal to help find out what may trigger your headaches. For example, write down:  What you eat and drink.  How much sleep you get.  Any change to your diet or medicines.  Try massage or other relaxation techniques.  Limit stress.  Sit up straight, and do not tense your muscles.  Do not use tobacco products, including cigarettes, chewing tobacco, or e-cigarettes. If you need help quitting, ask your health care provider.  Exercise regularly as told by your health care provider.  Sleep on a regular schedule. Get 7-9 hours of sleep, or the amount recommended by your health care provider. SEEK MEDICAL CARE IF:   Your symptoms are not helped by medicine.  You have a headache that is different from the usual headache.  You have nausea or you vomit.  You have a fever. SEEK IMMEDIATE MEDICAL CARE IF:   Your headache becomes severe.  You have repeated vomiting.  You have a stiff neck.  You have a loss of vision.  You have problems with speech.  You have pain in the eye or ear.  You have muscular weakness or loss of muscle control.  You lose your balance or have trouble walking.  You feel faint or pass out.  You have confusion.   This information is not intended to replace advice given to you by your health care provider. Make sure you discuss any questions you have with your health care provider.   Document Released: 03/16/2005 Document Revised: 12/05/2014 Document Reviewed: 07/09/2014 Elsevier Interactive Patient Education 2016 [place] A contusion is a deep bruise. Contusions are the result of a blunt injury to tissues and muscle fibers under the skin. The injury causes bleeding under the skin. The skin overlying  the contusion may turn blue, purple, or yellow. Minor injuries will give you a painless contusion, but more severe contusions may stay painful and swollen for a few weeks.  CAUSES  This condition is usually caused by a blow, trauma, or direct force to an area of the body. SYMPTOMS  Symptoms of this condition include:  Swelling of the injured area.  Pain and tenderness in the injured area.  Discoloration. The area may have redness and then turn blue, purple, or yellow. DIAGNOSIS  This condition is diagnosed based on a physical exam and medical history. An X-ray, CT scan, or MRI may be needed to determine if there are any associated injuries, such as broken bones (fractures). TREATMENT  Specific treatment for this condition depends on what area of the body was injured. In general, the best treatment for a contusion is resting, icing, applying pressure to (compression), and elevating the injured area. This is often called the RICE strategy. Over-the-counter anti-inflammatory medicines may also be recommended for pain control.  HOME CARE INSTRUCTIONS   Rest the injured area.  If directed, apply ice to the injured area:  Put ice in a plastic bag.  Place a  towel between your skin and the bag.  Leave the ice on for 20 minutes, 2-3 times per day.  If directed, apply light compression to the injured area using an elastic bandage. Make sure the bandage is not wrapped too tightly. Remove and reapply the bandage as directed by your health care provider.  If possible, raise (elevate) the injured area above the level of your heart while you are sitting or lying down.  Take over-the-counter and prescription medicines only as told by your health care provider. SEEK MEDICAL CARE IF:  Your symptoms do not improve after several days of treatment.  Your symptoms get worse.  You have difficulty moving the injured area. SEEK IMMEDIATE MEDICAL CARE IF:   You have severe pain.  You have numbness  in a hand or foot.  Your hand or foot turns pale or cold.   This information is not intended to replace advice given to you by your health care provider. Make sure you discuss any questions you have with your health care provider.   Document Released: 12/24/2004 Document Revised: 12/05/2014 Document Reviewed: 08/01/2014 Elsevier Interactive Patient Education [employer].

## 2015-01-25 NOTE — ED Notes (Signed)
Patient to have potassium rechecked prior to discharge.

## 2015-01-25 NOTE — ED Notes (Signed)
CRITICAL VALUE ALERT  Critical value received:  K 2.6  Date of notification:  01/25/15  Time of notification:  0940  Critical value read back:Yes.    Nurse who received alert:  Evelina Bucy  MD notified (1st page):  rancour  Time of first page:  24  MD notified (2nd page):  Time of second page:  Responding MD:  rancour  Time MD responded:  1155

## 2015-01-25 NOTE — ED Notes (Signed)
Pt states that she fell Friday evening and hit her head on cement (outside of [employer]). Hx of falls, unknown cause. Headache since falling. Heart cath done on Tuesday for clearance for knee replacement. States she is just getting around to having her headache checked. States she has been dizzy and "my head has been all confused" since falling.

## 2015-01-29 DIAGNOSIS — I10 Essential (primary) hypertension: Secondary | ICD-10-CM | POA: Diagnosis not present

## 2015-01-29 DIAGNOSIS — K219 Gastro-esophageal reflux disease without esophagitis: Secondary | ICD-10-CM | POA: Diagnosis not present

## 2015-01-29 DIAGNOSIS — E039 Hypothyroidism, unspecified: Secondary | ICD-10-CM | POA: Diagnosis not present

## 2015-01-29 DIAGNOSIS — D519 Vitamin B12 deficiency anemia, unspecified: Secondary | ICD-10-CM | POA: Diagnosis not present

## 2015-01-30 DIAGNOSIS — R05 Cough: Secondary | ICD-10-CM | POA: Diagnosis not present

## 2015-01-30 DIAGNOSIS — E039 Hypothyroidism, unspecified: Secondary | ICD-10-CM | POA: Diagnosis not present

## 2015-01-30 DIAGNOSIS — D519 Vitamin B12 deficiency anemia, unspecified: Secondary | ICD-10-CM | POA: Diagnosis not present

## 2015-02-05 DIAGNOSIS — Z13220 Encounter for screening for lipoid disorders: Secondary | ICD-10-CM | POA: Diagnosis not present

## 2015-02-05 DIAGNOSIS — E039 Hypothyroidism, unspecified: Secondary | ICD-10-CM | POA: Diagnosis not present

## 2015-02-05 DIAGNOSIS — I209 Angina pectoris, unspecified: Secondary | ICD-10-CM | POA: Diagnosis not present

## 2015-02-05 DIAGNOSIS — M10079 Idiopathic gout, unspecified ankle and foot: Secondary | ICD-10-CM | POA: Diagnosis not present

## 2015-02-05 DIAGNOSIS — I259 Chronic ischemic heart disease, unspecified: Secondary | ICD-10-CM | POA: Diagnosis not present

## 2015-02-05 DIAGNOSIS — B001 Herpesviral vesicular dermatitis: Secondary | ICD-10-CM | POA: Diagnosis not present

## 2015-02-05 DIAGNOSIS — F3289 Other specified depressive episodes: Secondary | ICD-10-CM | POA: Diagnosis not present

## 2015-02-05 DIAGNOSIS — N183 Chronic kidney disease, stage 3 (moderate): Secondary | ICD-10-CM | POA: Diagnosis not present

## 2015-02-05 DIAGNOSIS — E876 Hypokalemia: Secondary | ICD-10-CM | POA: Diagnosis not present

## 2015-02-05 DIAGNOSIS — Z0001 Encounter for general adult medical examination with abnormal findings: Secondary | ICD-10-CM | POA: Diagnosis not present

## 2015-02-05 DIAGNOSIS — I10 Essential (primary) hypertension: Secondary | ICD-10-CM | POA: Diagnosis not present

## 2015-02-05 DIAGNOSIS — K219 Gastro-esophageal reflux disease without esophagitis: Secondary | ICD-10-CM | POA: Diagnosis not present

## 2015-02-05 DIAGNOSIS — E7800 Pure hypercholesterolemia, unspecified: Secondary | ICD-10-CM | POA: Diagnosis not present

## 2015-02-07 ENCOUNTER — Ambulatory Visit (INDEPENDENT_AMBULATORY_CARE_PROVIDER_SITE_OTHER): Payer: Medicare Other | Admitting: Cardiovascular Disease

## 2015-02-07 ENCOUNTER — Encounter: Payer: Self-pay | Admitting: Cardiovascular Disease

## 2015-02-07 VITALS — BP 138/75 | HR 67 | Ht 64.0 in | Wt 185.0 lb

## 2015-02-07 DIAGNOSIS — I10 Essential (primary) hypertension: Secondary | ICD-10-CM | POA: Diagnosis not present

## 2015-02-07 DIAGNOSIS — I2510 Atherosclerotic heart disease of native coronary artery without angina pectoris: Secondary | ICD-10-CM | POA: Diagnosis not present

## 2015-02-07 DIAGNOSIS — N189 Chronic kidney disease, unspecified: Secondary | ICD-10-CM

## 2015-02-07 DIAGNOSIS — G4730 Sleep apnea, unspecified: Secondary | ICD-10-CM

## 2015-02-07 DIAGNOSIS — J438 Other emphysema: Secondary | ICD-10-CM

## 2015-02-07 DIAGNOSIS — E785 Hyperlipidemia, unspecified: Secondary | ICD-10-CM | POA: Diagnosis not present

## 2015-02-07 DIAGNOSIS — E876 Hypokalemia: Secondary | ICD-10-CM

## 2015-02-07 MED ORDER — POTASSIUM CHLORIDE ER 10 MEQ PO TBCR: 20.0000 meq | EXTENDED_RELEASE_TABLET | Freq: Every day | ORAL | Status: DC

## 2015-02-07 NOTE — Patient Instructions (Signed)
Continue all current medications. Your physician wants you to follow up in:  1 year.  You will receive a reminder letter in the mail one-two months in advance.  If you don't receive a letter, please call our office to schedule the follow up appointment   

## 2015-02-07 NOTE — Progress Notes (Signed)
Patient ID: Annette Sharp, female   DOB: [birth year], 74 y.o.   MRN: [id number]      SUBJECTIVE: The patient returns after undergoing coronary angiography on 01/22/15. This demonstrated a widely patent RCA stent with mild nonobstructive coronary artery disease with lesions of 20% and 30% in the LAD and RCA. Left ventricular systolic function was normal as was LVEDP. There were normal filling pressures and no evidence of pulmonary hypertension with right heart catheterization.  She is feeling much better. She is now taking 20 meq KCl daily and vitamin B12.   Review of Systems: As per "subjective", otherwise negative.  Allergies  Allergen Reactions  . Ace Inhibitors Cough  . Gemfibrozil Other (See Comments)    REACTION: Muscle Aches  . Statins     REACTION: Muscle Cramps  . Moxifloxacin Rash  . Ranexa [Ranolazine] Other (See Comments)    "NIGHTMARES"    Current Outpatient Prescriptions  Medication Sig Dispense Refill  . acyclovir (ZOVIRAX) 400 MG tablet Take 400 mg by mouth every morning.     Marland Kitchen albuterol (PROVENTIL HFA) 108 (90 BASE) MCG/ACT inhaler Inhale 1 puff into the lungs every 6 (six) hours as needed for shortness of breath. 1 Inhaler 1  . amLODipine (NORVASC) 10 MG tablet Take 10 mg by mouth every morning.     Marland Kitchen aspirin 81 MG tablet Take 81 mg by mouth every morning.     . betamethasone valerate (VALISONE) 0.1 % cream Apply 1 application topically 2 (two) times daily as needed.     . cetirizine (ZYRTEC) 10 MG tablet Take 10 mg by mouth daily.    . chlorthalidone (HYGROTON) 25 MG tablet Take 0.5 tablets (12.5 mg total) by mouth daily. 45 tablet 3  . citalopram (CELEXA) 20 MG tablet Take 20 mg by mouth every morning.     . clopidogrel (PLAVIX) 75 MG tablet Take 75 mg by mouth every morning.     . clotrimazole (LOTRIMIN) 1 % cream Apply 1 application topically as needed. For rash    . cyclobenzaprine (FLEXERIL) 5 MG tablet Take 5 mg by mouth 3 (three) times daily as needed for  muscle spasms.    Marland Kitchen HYDROcodone-acetaminophen (NORCO/VICODIN) 5-325 MG tablet Take 1 tablet by mouth every 6 (six) hours as needed for moderate pain.    . isosorbide mononitrate (IMDUR) 30 MG 24 hr tablet Take 1 tablet (30 mg total) by mouth 2 (two) times daily. 60 tablet 6  . levothyroxine (SYNTHROID, LEVOTHROID) 88 MCG tablet Take 88 mcg by mouth every morning.     Marland Kitchen omeprazole (PRILOSEC) 20 MG capsule Take 20 mg by mouth every morning.     . potassium chloride (K-DUR) 10 MEQ tablet Take 2 tablets (20 mEq total) by mouth daily. 30 tablet 0  . vitamin B-12 (CYANOCOBALAMIN) 1000 MCG tablet Take 1,000 mcg by mouth daily.     No current facility-administered medications for this visit.    Past Medical History  Diagnosis Date  . Overweight(278.02)     OBESITY  . Hyperlipidemia   . Other and unspecified angina pectoris     ANGINA  . Hypertension   . Back pain   . Peptic ulcer disease   . Thyroid disease     TREATED HYPOTHYROIDISM  . Sleep apnea     SEVERE OBSTRUCTIVE  . 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 Stroke ([place])   . Depression   . Chronic renal insufficiency  GFR 45 ML'S PER MINUTE  . Coronary artery disease     S/P CYPHER DRUG-ELUTING STENTS TO RIGHT CORONARY ARTERY JUNE 2007  . Coronary atherosclerosis of native coronary artery     NEGATIVE LEXISCAN FOR ISCHEMIA MARCH 23,2010 NORMAL LV FUNCTTION    Past Surgical History  Procedure Laterality Date  . Carpal tunnel release    . Cholecystectomy    . Total abdominal hysterectomy w/ bilateral salpingoophorectomy    . Replacement total knee    . Rotator cuff repair    . Cardiac catheterization N/A 01/22/2015    Procedure: Right/Left Heart Cath and Coronary Angiography;  Surgeon: Wellington Hampshire, MD;  Location: [facility];  Service: Cardiovascular;  Laterality: N/A;    Social History   Social History  . Marital Status: Married    Spouse Name: N/A  . Number of  Children: N/A  . Years of Education: N/A   Occupational History  . Not on file.   Social History Main Topics  . Smoking status: Never Smoker   . Smokeless tobacco: Never Used  . Alcohol Use: No  . Drug Use: No  . Sexual Activity: No   Other Topics Concern  . Not on file   Social History Narrative     Filed Vitals:   02/07/15 1019  BP: 138/75  Pulse: 67  Height: 5\' 4"  (1.626 m)  Weight: 185 lb (83.915 kg)    PHYSICAL EXAM General: NAD HEENT: Normal. Neck: No JVD, no thyromegaly. Lungs: Clear to auscultation bilaterally with normal respiratory effort. CV: Nondisplaced PMI. Regular rate and rhythm, normal S1/S2, no S3/S4, soft 1/6 pansystolic murmur along left sternal border. No pretibial or periankle edema. Abdomen: Soft, nontender, no distention.  Neurologic: Alert and oriented x 3.  Psych: Normal affect. Skin: Normal. Musculoskeletal: Normal range of motion, no gross deformities. Extremities: No clubbing or cyanosis.    ECG: Most recent ECG reviewed.      ASSESSMENT AND PLAN: 1. CAD: Coronary angiography results noted above. Continue ASA, Imdur, and Plavix. Statin intolerant.  2. Essential HTN: Normal. Continue chlorthalidone. I am unable to use ACEI/ARB due to stage 3 CKD.  3. COPD: PRN use of albuterol inhaler.  4. Hyperlipidemia: Lipids on 06/07/14 showed total cholesterol 156, triglycerides 71, HDL 65, LDL 77. No longer on Crestor.  Will obtain copy of lipids from PCP.  5. Sleep apnea: Using CPAP daily.   6. CKD stage 3: BUN 16 and creatinine 1.26 on 01/25/15. No changes to chlorthalidone use. K low at 2.6 but normalized to 3.6 after repletion.  7. CVA: Takes Plavix.  Dispo: f/u 1 year.  Kate Sable, M.D., F.A.C.C.

## 2015-03-27 ENCOUNTER — Encounter: Payer: Self-pay | Admitting: Physician Assistant

## 2015-03-27 ENCOUNTER — Telehealth: Payer: Self-pay | Admitting: *Deleted

## 2015-03-27 DIAGNOSIS — M1712 Unilateral primary osteoarthritis, left knee: Secondary | ICD-10-CM | POA: Diagnosis not present

## 2015-03-27 NOTE — H&P (Signed)
TOTAL KNEE ADMISSION H&P  Patient is being admitted for left total knee arthroplasty.  Subjective:  Chief Complaint:left knee pain.  HPI: Annette Sharp, 74 y.o. female, has a history of pain and functional disability in the left knee due to arthritis and has failed non-surgical conservative treatments for greater than 12 weeks to includeNSAID's and/or analgesics, corticosteriod injections, viscosupplementation injections, flexibility and strengthening excercises, supervised PT with diminished ADL's post treatment, use of assistive devices, weight reduction as appropriate and activity modification.  Onset of symptoms was gradual, starting 9 years ago with gradually worsening course since that time. The patient noted no past surgery on the left knee(s).  Patient currently rates pain in the left knee(s) at 10 out of 10 with activity. Patient has night pain, worsening of pain with activity and weight bearing, pain that interferes with activities of daily living, crepitus and joint swelling.  Patient has evidence of subchondral sclerosis, periarticular osteophytes and joint space narrowing by imaging studies.  There is no active infection.  Patient Active Problem List   Diagnosis Date Noted  . Primary localized osteoarthritis of left knee   . Coronary artery disease involving native coronary artery   . Dyspnea   . Chronic kidney disease 11/12/2011  . Preoperative clearance 06/24/2010  . SHOULDER PAIN 04/15/2009  . Hyperlipidemia 01/05/2009  . Overweight 01/05/2009  . Essential hypertension 01/05/2009  . Angina decubitus ([place]) 01/05/2009  . CAD, NATIVE VESSEL 01/05/2009   Past Medical History  Diagnosis Date  . Overweight(278.02)     OBESITY  . Hyperlipidemia   . Other and unspecified angina pectoris     ANGINA  . Hypertension   . Back pain   . Peptic ulcer disease   . Thyroid disease     TREATED HYPOTHYROIDISM  . Sleep apnea     SEVERE OBSTRUCTIVE  . Cerebrovascular disease      NONOBSTRUCTIVE CVA BY CAROTID DOPPLERS OCTOBER 2008  . Stroke ([place])   . Depression   . Chronic renal insufficiency     GFR 45 ML'S PER MINUTE  . Coronary artery disease     S/P CYPHER DRUG-ELUTING STENTS TO RIGHT CORONARY ARTERY JUNE 2007  . Coronary atherosclerosis of native coronary artery     NEGATIVE LEXISCAN FOR ISCHEMIA MARCH 23,2010 NORMAL LV FUNCTTION  . Primary localized osteoarthritis of left knee     Past Surgical History  Procedure Laterality Date  . Carpal tunnel release    . Cholecystectomy    . Total abdominal hysterectomy w/ bilateral salpingoophorectomy    . Replacement total knee Right 07/28/2010  . Rotator cuff repair Right 07/08/2009  . Cardiac catheterization N/A 01/22/2015    Procedure: Right/Left Heart Cath and Coronary Angiography;  Surgeon: Wellington Hampshire, MD;  Location: [facility];  Service: Cardiovascular;  Laterality: N/A;    No prescriptions prior to admission   Allergies  Allergen Reactions  . Ace Inhibitors Cough  . Gemfibrozil Other (See Comments)    REACTION: Muscle Aches  . Statins     REACTION: Muscle Cramps  . Moxifloxacin Rash  . Ranexa [Ranolazine] Other (See Comments)    "NIGHTMARES"    Social History  Substance Use Topics  . Smoking status: Never Smoker   . Smokeless tobacco: Never Used  . Alcohol Use: No    Family History  Problem Relation Age of Onset  . CAD    . Hypertension    . Heart attack    . Cancer  Review of Systems  Constitutional: Negative.   HENT: Negative.   Eyes: Negative.   Respiratory: Negative.   Cardiovascular: Negative.   Gastrointestinal: Negative.   Genitourinary: Negative.   Musculoskeletal: Positive for back pain and joint pain.  Skin: Negative.   Neurological: Negative.   Endo/Heme/Allergies: Negative.   Psychiatric/Behavioral: Negative.     Objective:  Physical Exam  Constitutional: She is oriented to person, place, and time. She appears well-developed and well-nourished.   HENT:  Head: Normocephalic and atraumatic.  Mouth/Throat: Oropharynx is clear and moist.  Eyes: Conjunctivae are normal. Pupils are equal, round, and reactive to light.  Neck: Neck supple.  Cardiovascular: Normal rate and regular rhythm.   Respiratory: Effort normal and breath sounds normal.  GI: Soft.  Genitourinary:  Not pertinent to current symptomatology therefore not examined.  Neurological: She is alert and oriented to person, place, and time.  Skin: Skin is warm and dry.  Psychiatric: She has a normal mood and affect. Her behavior is normal.    Vital signs in last 24 hours: Temp:  [97.7 F (36.5 C)] 97.7 F (36.5 C) (12/28 1600) Pulse Rate:  [62] 62 (12/28 1600) BP: (152)/(76) 152/76 mmHg (12/28 1600) SpO2:  [99 %] 99 % (12/28 1600) Weight:  [83.915 kg (185 lb)] 83.915 kg (185 lb) (12/28 1600)  Labs:   Estimated body mass index is 33.83 kg/(m^2) as calculated from the following:   Height as of this encounter: 5\' 2"  (1.575 m).   Weight as of this encounter: 83.915 kg (185 lb).   Imaging Review Plain radiographs demonstrate severe degenerative joint disease of the left knee(s). The overall alignment issignificant varus. The bone quality appears to be good for age and reported activity level.  Assessment/Plan:  End stage arthritis, left knee  Principal Problem:   Primary localized osteoarthritis of left knee Active Problems:   Hyperlipidemia   Overweight   Essential hypertension   Angina decubitus ([facility])   CAD, NATIVE VESSEL   Chronic kidney disease   The patient history, physical examination, clinical judgment of the provider and imaging studies are consistent with end stage degenerative joint disease of the left knee(s) and total knee arthroplasty is deemed medically necessary. The treatment options including medical management, injection therapy arthroscopy and arthroplasty were discussed at length. The risks and benefits of total knee arthroplasty were  presented and reviewed. The risks due to aseptic loosening, infection, stiffness, patella tracking problems, thromboembolic complications and other imponderables were discussed. The patient acknowledged the explanation, agreed to proceed with the plan and consent was signed. Patient is being admitted for inpatient treatment for surgery, pain control, PT, OT, prophylactic antibiotics, VTE prophylaxis, progressive ambulation and ADL's and discharge planning. The patient is planning to be discharged home with home health services   Telemetry post op due to irregular heart beat and history of syncope.  Cleared for surgery by Dr Pennelope Bracken

## 2015-03-27 NOTE — Telephone Encounter (Signed)
Patient scheduled for left total knee replacement on 04/08/2015.  Sherry with [facility] question how long patient should hold her ASA / Plavix prior to surgery.

## 2015-03-28 NOTE — Telephone Encounter (Signed)
Noted, will fax back to [facility] at her request.  Fax:  [phone]

## 2015-03-28 NOTE — Pre-Procedure Instructions (Signed)
    DALARY DEMAURO  03/28/2015      [id number] - [place], [place] [address] [place] [address] Phone: [phone] Fax: [phone]    Your procedure is scheduled on 04/08/15.  Report to [facility] Admitting at 7 A.M.  Call this number if you have problems the morning of surgery:  [phone]   Remember:  Do not eat food or drink liquids after midnight.  Take these medicines the morning of surgery with A SIP OF WATER --all inhalers,amlodipine,zyrtec,hygroton,hydrocodone,imdur,synthroid,prilosec   Do not wear jewelry, make-up or nail polish.  Do not wear lotions, powders, or perfumes.  You may wear deodorant.  Do not shave 48 hours prior to surgery.  Men may shave face and neck.  Do not bring valuables to the hospital.  [facility] is not responsible for any belongings or valuables.  Contacts, dentures or bridgework may not be worn into surgery.  Leave your suitcase in the car.  After surgery it may be brought to your room.  For patients admitted to the hospital, discharge time will be determined by your treatment team.  Patients discharged the day of surgery will not be allowed to drive home.   Name and phone number of your driver:   Special instructions:   Please read over the following fact sheets that you were given. Pain Booklet, Coughing and Deep Breathing, Total Joint Packet and MRSA Information

## 2015-03-28 NOTE — Telephone Encounter (Signed)
Plavix 5 days before, ASA ideally continued throughout surgery.

## 2015-03-29 ENCOUNTER — Encounter (HOSPITAL_COMMUNITY): Payer: Self-pay

## 2015-03-29 ENCOUNTER — Encounter (HOSPITAL_COMMUNITY)
Admission: RE | Admit: 2015-03-29 | Discharge: 2015-03-29 | Disposition: A | Discharge status: Home / Self Care | Payer: Medicare Other | Source: Ambulatory Visit | Attending: Orthopedic Surgery | Admitting: Orthopedic Surgery

## 2015-03-29 DIAGNOSIS — Z01812 Encounter for preprocedural laboratory examination: Secondary | ICD-10-CM | POA: Insufficient documentation

## 2015-03-29 DIAGNOSIS — M1712 Unilateral primary osteoarthritis, left knee: Secondary | ICD-10-CM | POA: Diagnosis not present

## 2015-03-29 DIAGNOSIS — Z0183 Encounter for blood typing: Secondary | ICD-10-CM | POA: Insufficient documentation

## 2015-03-29 HISTORY — DX: Chronic obstructive pulmonary disease, unspecified: J44.9

## 2015-03-29 HISTORY — DX: Anxiety disorder, unspecified: F41.9

## 2015-03-29 HISTORY — DX: Hypothyroidism, unspecified: E03.9

## 2015-03-29 LAB — COMPREHENSIVE METABOLIC PANEL
ALBUMIN: 4 g/dL (ref 3.5–5.0)
ALT: 16 U/L (ref 14–54)
ANION GAP: 11 (ref 5–15)
AST: 21 U/L (ref 15–41)
Alkaline Phosphatase: 56 U/L (ref 38–126)
BUN: 17 mg/dL (ref 6–20)
CALCIUM: 9.7 mg/dL (ref 8.9–10.3)
CO2: 28 mmol/L (ref 22–32)
Chloride: 102 mmol/L (ref 101–111)
Creatinine, Ser: 1.59 mg/dL — ABNORMAL HIGH (ref 0.44–1.00)
GFR calc non Af Amer: 31 mL/min — ABNORMAL LOW (ref >60)
GFR, EST AFRICAN AMERICAN: 36 mL/min — AB (ref >60)
GLUCOSE: 102 mg/dL — AB (ref 65–99)
POTASSIUM: 3.4 mmol/L — AB (ref 3.5–5.1)
SODIUM: 141 mmol/L (ref 135–145)
Total Bilirubin: 0.5 mg/dL (ref 0.3–1.2)
Total Protein: 7.7 g/dL (ref 6.5–8.1)

## 2015-03-29 LAB — CBC WITH DIFFERENTIAL/PLATELET
BASOS PCT: 1 %
Basophils Absolute: 0 10*3/uL (ref 0.0–0.1)
EOS ABS: 0.2 10*3/uL (ref 0.0–0.7)
EOS PCT: 2 %
HCT: 42.1 % (ref 36.0–46.0)
Hemoglobin: 14.9 g/dL (ref 12.0–15.0)
LYMPHS ABS: 2.8 10*3/uL (ref 0.7–4.0)
Lymphocytes Relative: 36 %
MCH: 32 pg (ref 26.0–34.0)
MCHC: 35.4 g/dL (ref 30.0–36.0)
MCV: 90.3 fL (ref 78.0–100.0)
MONO ABS: 0.6 10*3/uL (ref 0.1–1.0)
MONOS PCT: 8 %
NEUTROS PCT: 53 %
Neutro Abs: 4.2 10*3/uL (ref 1.7–7.7)
PLATELETS: 288 10*3/uL (ref 150–400)
RBC: 4.66 MIL/uL (ref 3.87–5.11)
RDW: 12.8 % (ref 11.5–15.5)
WBC: 7.8 10*3/uL (ref 4.0–10.5)

## 2015-03-29 LAB — TYPE AND SCREEN
ABO/RH(D): O POS
ANTIBODY SCREEN: NEGATIVE

## 2015-03-29 LAB — SURGICAL PCR SCREEN
MRSA, PCR: NEGATIVE
Staphylococcus aureus: NEGATIVE

## 2015-03-29 LAB — PROTIME-INR
INR: 1.01 (ref 0.00–1.49)
Prothrombin Time: 13.5 seconds (ref 11.6–15.2)

## 2015-03-29 LAB — APTT: aPTT: 31 seconds (ref 24–37)

## 2015-03-30 LAB — URINE CULTURE

## 2015-04-07 MED ORDER — LACTATED RINGERS IV SOLN
INTRAVENOUS | Status: DC
  Administered 2015-04-08 (×3): via INTRAVENOUS

## 2015-04-07 MED ORDER — CHLORHEXIDINE GLUCONATE 4 % EX LIQD: 60.0000 mL | Freq: Once | CUTANEOUS | Status: DC

## 2015-04-07 MED ORDER — POVIDONE-IODINE 7.5 % EX SOLN
Freq: Once | CUTANEOUS | Status: DC
  Filled 2015-04-07: qty 118

## 2015-04-07 MED ORDER — CEFAZOLIN SODIUM-DEXTROSE 2-3 GM-% IV SOLR
2.0000 g | INTRAVENOUS | Status: AC
  Administered 2015-04-08: 2 g via INTRAVENOUS
  Filled 2015-04-07: qty 50

## 2015-04-07 NOTE — Anesthesia Preprocedure Evaluation (Addendum)
Anesthesia Evaluation  Patient identified by MRN, date of birth, ID band Patient awake    Reviewed: Allergy & Precautions, NPO status , Patient's Chart, lab work & pertinent test results  Airway Mallampati: II   Neck ROM: Full    Dental  (+) Teeth Intact, Dental Advisory Given   Pulmonary neg pulmonary ROS, sleep apnea and Continuous Positive Airway Pressure Ventilation ,    breath sounds clear to auscultation       Cardiovascular hypertension, Pt. on medications negative cardio ROS   Rhythm:Regular  Cardiac CATH 12/2014 Normal LVF, Patent STENT RCA, mild stenosis 20-30% other vessels   Neuro/Psych Anxiety Depression negative neurological ROS  negative psych ROS   GI/Hepatic negative GI ROS, Neg liver ROS,   Endo/Other  negative endocrine ROSHypothyroidism   Renal/GU negative Renal ROS  negative genitourinary   Musculoskeletal negative musculoskeletal ROS (+)   Abdominal (+)  Abdomen: soft.    Peds negative pediatric ROS (+)  Hematology negative hematology ROS (+) 15/42, INR 1.01   Anesthesia Other Findings   Reproductive/Obstetrics negative OB ROS                           Anesthesia Physical Anesthesia Plan  ASA: III  Anesthesia Plan: Spinal   Post-op Pain Management: GA combined w/ Regional for post-op pain   Induction: Intravenous  Airway Management Planned: Oral ETT and Nasal Cannula  Additional Equipment:   Intra-op Plan:   Post-operative Plan:   Informed Consent: I have reviewed the patients History and Physical, chart, labs and discussed the procedure including the risks, benefits and alternatives for the proposed anesthesia with the patient or authorized representative who has indicated his/her understanding and acceptance.     Plan Discussed with:   Anesthesia Plan Comments: (Check to verify off plavix x 5 days, if so will offer spinal, if not GA with [facility] Block)         Anesthesia Quick Evaluation

## 2015-04-08 ENCOUNTER — Inpatient Hospital Stay (HOSPITAL_COMMUNITY)
Admission: RE | Admit: 2015-04-08 | Discharge: 2015-04-10 | DRG: 470 | Disposition: A | Discharge status: Home / Self Care | Payer: Medicare Other | Source: Ambulatory Visit | Attending: Orthopedic Surgery | Admitting: Orthopedic Surgery

## 2015-04-08 ENCOUNTER — Inpatient Hospital Stay (HOSPITAL_COMMUNITY): Payer: Medicare Other | Admitting: Anesthesiology

## 2015-04-08 ENCOUNTER — Encounter (HOSPITAL_COMMUNITY)
Admission: RE | Disposition: A | Discharge status: Home / Self Care | Payer: Self-pay | Source: Ambulatory Visit | Attending: Orthopedic Surgery

## 2015-04-08 ENCOUNTER — Encounter (HOSPITAL_COMMUNITY): Payer: Self-pay | Admitting: Anesthesiology

## 2015-04-08 DIAGNOSIS — F419 Anxiety disorder, unspecified: Secondary | ICD-10-CM | POA: Diagnosis present

## 2015-04-08 DIAGNOSIS — F329 Major depressive disorder, single episode, unspecified: Secondary | ICD-10-CM | POA: Diagnosis present

## 2015-04-08 DIAGNOSIS — J449 Chronic obstructive pulmonary disease, unspecified: Secondary | ICD-10-CM | POA: Diagnosis present

## 2015-04-08 DIAGNOSIS — Z23 Encounter for immunization: Secondary | ICD-10-CM | POA: Diagnosis not present

## 2015-04-08 DIAGNOSIS — I25119 Atherosclerotic heart disease of native coronary artery with unspecified angina pectoris: Secondary | ICD-10-CM | POA: Diagnosis present

## 2015-04-08 DIAGNOSIS — E663 Overweight: Secondary | ICD-10-CM | POA: Diagnosis present

## 2015-04-08 DIAGNOSIS — I208 Other forms of angina pectoris: Secondary | ICD-10-CM | POA: Diagnosis present

## 2015-04-08 DIAGNOSIS — G8918 Other acute postprocedural pain: Secondary | ICD-10-CM | POA: Diagnosis not present

## 2015-04-08 DIAGNOSIS — Z8249 Family history of ischemic heart disease and other diseases of the circulatory system: Secondary | ICD-10-CM | POA: Diagnosis not present

## 2015-04-08 DIAGNOSIS — M179 Osteoarthritis of knee, unspecified: Secondary | ICD-10-CM | POA: Diagnosis present

## 2015-04-08 DIAGNOSIS — Z955 Presence of coronary angioplasty implant and graft: Secondary | ICD-10-CM

## 2015-04-08 DIAGNOSIS — N189 Chronic kidney disease, unspecified: Secondary | ICD-10-CM | POA: Diagnosis present

## 2015-04-08 DIAGNOSIS — Z888 Allergy status to other drugs, medicaments and biological substances status: Secondary | ICD-10-CM | POA: Diagnosis not present

## 2015-04-08 DIAGNOSIS — E785 Hyperlipidemia, unspecified: Secondary | ICD-10-CM | POA: Diagnosis present

## 2015-04-08 DIAGNOSIS — E039 Hypothyroidism, unspecified: Secondary | ICD-10-CM | POA: Diagnosis present

## 2015-04-08 DIAGNOSIS — Z96651 Presence of right artificial knee joint: Secondary | ICD-10-CM | POA: Diagnosis present

## 2015-04-08 DIAGNOSIS — I2510 Atherosclerotic heart disease of native coronary artery without angina pectoris: Secondary | ICD-10-CM | POA: Diagnosis present

## 2015-04-08 DIAGNOSIS — I129 Hypertensive chronic kidney disease with stage 1 through stage 4 chronic kidney disease, or unspecified chronic kidney disease: Secondary | ICD-10-CM | POA: Diagnosis not present

## 2015-04-08 DIAGNOSIS — G4730 Sleep apnea, unspecified: Secondary | ICD-10-CM | POA: Diagnosis present

## 2015-04-08 DIAGNOSIS — M1712 Unilateral primary osteoarthritis, left knee: Principal | ICD-10-CM | POA: Diagnosis present

## 2015-04-08 DIAGNOSIS — Z6833 Body mass index (BMI) 33.0-33.9, adult: Secondary | ICD-10-CM | POA: Diagnosis not present

## 2015-04-08 DIAGNOSIS — Z8673 Personal history of transient ischemic attack (TIA), and cerebral infarction without residual deficits: Secondary | ICD-10-CM

## 2015-04-08 DIAGNOSIS — I10 Essential (primary) hypertension: Secondary | ICD-10-CM | POA: Diagnosis present

## 2015-04-08 DIAGNOSIS — M25562 Pain in left knee: Secondary | ICD-10-CM | POA: Diagnosis not present

## 2015-04-08 DIAGNOSIS — M1710 Unilateral primary osteoarthritis, unspecified knee: Secondary | ICD-10-CM | POA: Diagnosis present

## 2015-04-08 HISTORY — PX: TOTAL KNEE ARTHROPLASTY: SHX125

## 2015-04-08 HISTORY — DX: Unilateral primary osteoarthritis, left knee: M17.12

## 2015-04-08 SURGERY — ARTHROPLASTY, KNEE, TOTAL
Anesthesia: Spinal | Site: Knee | Laterality: Left

## 2015-04-08 MED ORDER — SODIUM CHLORIDE 0.9 % IJ SOLN
INTRAMUSCULAR | Status: AC
  Filled 2015-04-08: qty 10

## 2015-04-08 MED ORDER — LIDOCAINE HCL (CARDIAC) 20 MG/ML IV SOLN
INTRAVENOUS | Status: DC | PRN
  Administered 2015-04-08: 60 mg via INTRATRACHEAL

## 2015-04-08 MED ORDER — MEPERIDINE HCL 25 MG/ML IJ SOLN: 6.2500 mg | INTRAMUSCULAR | Status: DC | PRN

## 2015-04-08 MED ORDER — DOCUSATE SODIUM 100 MG PO CAPS
100.0000 mg | ORAL_CAPSULE | Freq: Two times a day (BID) | ORAL | Status: DC
  Administered 2015-04-08 – 2015-04-10 (×5): 100 mg via ORAL
  Filled 2015-04-08 (×5): qty 1

## 2015-04-08 MED ORDER — LIDOCAINE HCL (CARDIAC) 20 MG/ML IV SOLN
INTRAVENOUS | Status: AC
  Filled 2015-04-08: qty 5

## 2015-04-08 MED ORDER — PROPOFOL 10 MG/ML IV BOLUS
INTRAVENOUS | Status: AC
  Filled 2015-04-08: qty 20

## 2015-04-08 MED ORDER — FENTANYL CITRATE (PF) 100 MCG/2ML IJ SOLN
INTRAMUSCULAR | Status: AC
  Administered 2015-04-08: 50 ug
  Filled 2015-04-08: qty 2

## 2015-04-08 MED ORDER — DEXAMETHASONE SODIUM PHOSPHATE 10 MG/ML IJ SOLN
10.0000 mg | Freq: Three times a day (TID) | INTRAMUSCULAR | Status: AC
  Administered 2015-04-08 – 2015-04-10 (×5): 10 mg via INTRAVENOUS
  Filled 2015-04-08 (×5): qty 1

## 2015-04-08 MED ORDER — ACETAMINOPHEN 325 MG PO TABS: 650.0000 mg | ORAL_TABLET | Freq: Four times a day (QID) | ORAL | Status: DC | PRN

## 2015-04-08 MED ORDER — POLYETHYLENE GLYCOL 3350 17 G PO PACK
17.0000 g | PACK | Freq: Two times a day (BID) | ORAL | Status: DC
  Administered 2015-04-08 – 2015-04-10 (×4): 17 g via ORAL
  Filled 2015-04-08 (×4): qty 1

## 2015-04-08 MED ORDER — ONDANSETRON HCL 4 MG/2ML IJ SOLN
INTRAMUSCULAR | Status: AC
  Filled 2015-04-08: qty 2

## 2015-04-08 MED ORDER — SODIUM CHLORIDE 0.9 % IR SOLN
Status: DC | PRN
Start: 2015-04-08 — End: 2015-04-08
  Administered 2015-04-08: 1000 mL

## 2015-04-08 MED ORDER — CEFAZOLIN SODIUM-DEXTROSE 2-3 GM-% IV SOLR
2.0000 g | Freq: Three times a day (TID) | INTRAVENOUS | Status: AC
  Administered 2015-04-08 – 2015-04-09 (×2): 2 g via INTRAVENOUS
  Filled 2015-04-08 (×3): qty 50

## 2015-04-08 MED ORDER — ACYCLOVIR 400 MG PO TABS
400.0000 mg | ORAL_TABLET | Freq: Every morning | ORAL | Status: DC
  Administered 2015-04-09 – 2015-04-10 (×2): 400 mg via ORAL
  Filled 2015-04-08 (×2): qty 1

## 2015-04-08 MED ORDER — EPHEDRINE SULFATE 50 MG/ML IJ SOLN
INTRAMUSCULAR | Status: AC
  Filled 2015-04-08: qty 1

## 2015-04-08 MED ORDER — BUPIVACAINE-EPINEPHRINE (PF) 0.5% -1:200000 IJ SOLN
INTRAMUSCULAR | Status: DC | PRN
  Administered 2015-04-08: 25 mL

## 2015-04-08 MED ORDER — PHENYLEPHRINE 40 MCG/ML (10ML) SYRINGE FOR IV PUSH (FOR BLOOD PRESSURE SUPPORT)
PREFILLED_SYRINGE | INTRAVENOUS | Status: AC
  Filled 2015-04-08: qty 10

## 2015-04-08 MED ORDER — ISOSORBIDE MONONITRATE ER 30 MG PO TB24
30.0000 mg | ORAL_TABLET | Freq: Two times a day (BID) | ORAL | Status: DC
  Administered 2015-04-08 – 2015-04-10 (×4): 30 mg via ORAL
  Filled 2015-04-08 (×4): qty 1

## 2015-04-08 MED ORDER — SODIUM CHLORIDE 0.9 % IR SOLN
Status: DC | PRN
  Administered 2015-04-08: 3000 mL

## 2015-04-08 MED ORDER — MENTHOL 3 MG MT LOZG: 1.0000 | LOZENGE | OROMUCOSAL | Status: DC | PRN

## 2015-04-08 MED ORDER — ONDANSETRON HCL 4 MG/2ML IJ SOLN
INTRAMUSCULAR | Status: DC | PRN
  Administered 2015-04-08: 4 mg via INTRAVENOUS

## 2015-04-08 MED ORDER — POTASSIUM CHLORIDE ER 10 MEQ PO TBCR
20.0000 meq | EXTENDED_RELEASE_TABLET | Freq: Every day | ORAL | Status: DC
  Administered 2015-04-09 – 2015-04-10 (×2): 20 meq via ORAL
  Filled 2015-04-08 (×2): qty 2

## 2015-04-08 MED ORDER — OXYCODONE HCL 5 MG PO TABS
5.0000 mg | ORAL_TABLET | ORAL | Status: DC | PRN
  Administered 2015-04-08 – 2015-04-10 (×11): 10 mg via ORAL
  Filled 2015-04-08 (×11): qty 2

## 2015-04-08 MED ORDER — PANTOPRAZOLE SODIUM 40 MG PO TBEC
40.0000 mg | DELAYED_RELEASE_TABLET | Freq: Every day | ORAL | Status: DC
  Administered 2015-04-09 – 2015-04-10 (×2): 40 mg via ORAL
  Filled 2015-04-08 (×2): qty 1

## 2015-04-08 MED ORDER — FENTANYL CITRATE (PF) 250 MCG/5ML IJ SOLN
INTRAMUSCULAR | Status: DC | PRN
  Administered 2015-04-08 (×2): 50 ug via INTRAVENOUS

## 2015-04-08 MED ORDER — BUPIVACAINE-EPINEPHRINE (PF) 0.25% -1:200000 IJ SOLN
INTRAMUSCULAR | Status: AC
  Filled 2015-04-08: qty 30

## 2015-04-08 MED ORDER — AMLODIPINE BESYLATE 10 MG PO TABS
10.0000 mg | ORAL_TABLET | Freq: Every morning | ORAL | Status: DC
  Administered 2015-04-10: 10 mg via ORAL
  Filled 2015-04-08: qty 1

## 2015-04-08 MED ORDER — FENTANYL CITRATE (PF) 250 MCG/5ML IJ SOLN
INTRAMUSCULAR | Status: AC
  Filled 2015-04-08: qty 5

## 2015-04-08 MED ORDER — LEVOTHYROXINE SODIUM 88 MCG PO TABS
88.0000 ug | ORAL_TABLET | Freq: Every day | ORAL | Status: DC
  Administered 2015-04-09 – 2015-04-10 (×2): 88 ug via ORAL
  Filled 2015-04-08 (×2): qty 1

## 2015-04-08 MED ORDER — METOCLOPRAMIDE HCL 5 MG PO TABS: 5.0000 mg | ORAL_TABLET | Freq: Three times a day (TID) | ORAL | Status: DC | PRN

## 2015-04-08 MED ORDER — EPHEDRINE SULFATE 50 MG/ML IJ SOLN
INTRAMUSCULAR | Status: DC | PRN
  Administered 2015-04-08 (×2): 10 mg via INTRAVENOUS
  Administered 2015-04-08: 5 mg via INTRAVENOUS

## 2015-04-08 MED ORDER — ALBUTEROL SULFATE (2.5 MG/3ML) 0.083% IN NEBU: 2.5000 mg | INHALATION_SOLUTION | Freq: Four times a day (QID) | RESPIRATORY_TRACT | Status: DC | PRN

## 2015-04-08 MED ORDER — ALUM & MAG HYDROXIDE-SIMETH 200-200-20 MG/5ML PO SUSP: 30.0000 mL | ORAL | Status: DC | PRN

## 2015-04-08 MED ORDER — HYDROMORPHONE HCL 1 MG/ML IJ SOLN: 0.2500 mg | INTRAMUSCULAR | Status: DC | PRN

## 2015-04-08 MED ORDER — LORATADINE 10 MG PO TABS
10.0000 mg | ORAL_TABLET | Freq: Every day | ORAL | Status: DC
  Administered 2015-04-09 – 2015-04-10 (×2): 10 mg via ORAL
  Filled 2015-04-08 (×3): qty 1

## 2015-04-08 MED ORDER — POTASSIUM CHLORIDE IN NACL 20-0.9 MEQ/L-% IV SOLN
INTRAVENOUS | Status: DC
  Administered 2015-04-08: 20:00:00 via INTRAVENOUS
  Filled 2015-04-08 (×2): qty 1000

## 2015-04-08 MED ORDER — PROPOFOL 500 MG/50ML IV EMUL
INTRAVENOUS | Status: DC | PRN
  Administered 2015-04-08: 50 ug/kg/min via INTRAVENOUS

## 2015-04-08 MED ORDER — METOCLOPRAMIDE HCL 5 MG/ML IJ SOLN: 5.0000 mg | Freq: Three times a day (TID) | INTRAMUSCULAR | Status: DC | PRN

## 2015-04-08 MED ORDER — DEXAMETHASONE SODIUM PHOSPHATE 10 MG/ML IJ SOLN
INTRAMUSCULAR | Status: AC
  Filled 2015-04-08: qty 1

## 2015-04-08 MED ORDER — DIPHENHYDRAMINE HCL 12.5 MG/5ML PO ELIX: 12.5000 mg | ORAL_SOLUTION | ORAL | Status: DC | PRN

## 2015-04-08 MED ORDER — PHENOL 1.4 % MT LIQD: 1.0000 | OROMUCOSAL | Status: DC | PRN

## 2015-04-08 MED ORDER — ONDANSETRON HCL 4 MG PO TABS: 4.0000 mg | ORAL_TABLET | Freq: Four times a day (QID) | ORAL | Status: DC | PRN

## 2015-04-08 MED ORDER — ONDANSETRON HCL 4 MG/2ML IJ SOLN: 4.0000 mg | Freq: Four times a day (QID) | INTRAMUSCULAR | Status: DC | PRN

## 2015-04-08 MED ORDER — BUPIVACAINE IN DEXTROSE 0.75-8.25 % IT SOLN
INTRATHECAL | Status: DC | PRN
  Administered 2015-04-08: 2 mL via INTRATHECAL

## 2015-04-08 MED ORDER — DEXAMETHASONE SODIUM PHOSPHATE 10 MG/ML IJ SOLN
INTRAMUSCULAR | Status: DC | PRN
  Administered 2015-04-08: 10 mg via INTRAVENOUS

## 2015-04-08 MED ORDER — HYDROMORPHONE HCL 1 MG/ML IJ SOLN
1.0000 mg | INTRAMUSCULAR | Status: DC | PRN
  Administered 2015-04-08 – 2015-04-09 (×2): 1 mg via INTRAVENOUS
  Filled 2015-04-08 (×3): qty 1

## 2015-04-08 MED ORDER — INFLUENZA VAC SPLIT QUAD 0.5 ML IM SUSY
0.5000 mL | PREFILLED_SYRINGE | INTRAMUSCULAR | Status: AC
  Administered 2015-04-10: 0.5 mL via INTRAMUSCULAR
  Filled 2015-04-08: qty 0.5

## 2015-04-08 MED ORDER — TRIAMCINOLONE ACETONIDE 0.1 % EX CREA
TOPICAL_CREAM | Freq: Two times a day (BID) | CUTANEOUS | Status: DC
  Administered 2015-04-09: 22:00:00 via TOPICAL
  Filled 2015-04-08: qty 15

## 2015-04-08 MED ORDER — CLOPIDOGREL BISULFATE 75 MG PO TABS
75.0000 mg | ORAL_TABLET | Freq: Every morning | ORAL | Status: DC
  Administered 2015-04-08 – 2015-04-10 (×3): 75 mg via ORAL
  Filled 2015-04-08 (×3): qty 1

## 2015-04-08 MED ORDER — MIDAZOLAM HCL 2 MG/2ML IJ SOLN
INTRAMUSCULAR | Status: AC
  Administered 2015-04-08: 1 mg
  Filled 2015-04-08: qty 2

## 2015-04-08 MED ORDER — ROCURONIUM BROMIDE 50 MG/5ML IV SOLN
INTRAVENOUS | Status: AC
  Filled 2015-04-08: qty 1

## 2015-04-08 MED ORDER — PROMETHAZINE HCL 25 MG/ML IJ SOLN: 6.2500 mg | INTRAMUSCULAR | Status: DC | PRN

## 2015-04-08 MED ORDER — ACETAMINOPHEN 650 MG RE SUPP: 650.0000 mg | Freq: Four times a day (QID) | RECTAL | Status: DC | PRN

## 2015-04-08 MED ORDER — CYCLOBENZAPRINE HCL 10 MG PO TABS
5.0000 mg | ORAL_TABLET | Freq: Three times a day (TID) | ORAL | Status: DC | PRN
  Administered 2015-04-09 – 2015-04-10 (×5): 5 mg via ORAL
  Filled 2015-04-08 (×5): qty 1

## 2015-04-08 MED ORDER — BUPIVACAINE-EPINEPHRINE 0.25% -1:200000 IJ SOLN
INTRAMUSCULAR | Status: DC | PRN
  Administered 2015-04-08: 30 mL

## 2015-04-08 MED ORDER — ASPIRIN EC 325 MG PO TBEC
325.0000 mg | DELAYED_RELEASE_TABLET | Freq: Every day | ORAL | Status: DC
  Administered 2015-04-09 – 2015-04-10 (×2): 325 mg via ORAL
  Filled 2015-04-08 (×2): qty 1

## 2015-04-08 MED ORDER — BUPIVACAINE IN DEXTROSE 0.75-8.25 % IT SOLN: INTRATHECAL | Status: DC | PRN

## 2015-04-08 MED ORDER — CITALOPRAM HYDROBROMIDE 20 MG PO TABS
20.0000 mg | ORAL_TABLET | Freq: Every morning | ORAL | Status: DC
  Administered 2015-04-09 – 2015-04-10 (×2): 20 mg via ORAL
  Filled 2015-04-08 (×2): qty 1

## 2015-04-08 SURGICAL SUPPLY — 75 items
APL SKNCLS STERI-STRIP NONHPOA (GAUZE/BANDAGES/DRESSINGS) ×1
BANDAGE ELASTIC 6 VELCRO ST LF (GAUZE/BANDAGES/DRESSINGS) ×2 IMPLANT
BANDAGE ESMARK 6X9 LF (GAUZE/BANDAGES/DRESSINGS) ×1 IMPLANT
BENZOIN TINCTURE PRP APPL 2/3 (GAUZE/BANDAGES/DRESSINGS) ×3 IMPLANT
BLADE SAGITTAL 25.0X1.19X90 (BLADE) ×2 IMPLANT
BLADE SAGITTAL 25.0X1.19X90MM (BLADE) ×1
BLADE SAW RECIP 87.9 MT (BLADE) ×2 IMPLANT
BLADE SAW SAG 90X13X1.27 (BLADE) ×3 IMPLANT
BLADE SURG 10 STRL SS (BLADE) ×6 IMPLANT
BNDG CMPR 9X6 STRL LF SNTH (GAUZE/BANDAGES/DRESSINGS) ×1
BNDG CMPR MED 15X6 ELC VLCR LF (GAUZE/BANDAGES/DRESSINGS) ×1
BNDG ELASTIC 6X15 VLCR STRL LF (GAUZE/BANDAGES/DRESSINGS) ×3 IMPLANT
BNDG ESMARK 6X9 LF (GAUZE/BANDAGES/DRESSINGS) ×3
BOWL SMART MIX CTS (DISPOSABLE) ×3 IMPLANT
CAPT KNEE TOTAL 3 ATTUNE ×2 IMPLANT
CEMENT HV SMART SET (Cement) ×6 IMPLANT
CLOSURE WOUND 1/2 X4 (GAUZE/BANDAGES/DRESSINGS) ×1
COVER SURGICAL LIGHT HANDLE (MISCELLANEOUS) ×3 IMPLANT
CUFF TOURNIQUET SINGLE 34IN LL (TOURNIQUET CUFF) ×3 IMPLANT
CUFF TOURNIQUET SINGLE 44IN (TOURNIQUET CUFF) IMPLANT
DECANTER SPIKE VIAL GLASS SM (MISCELLANEOUS) ×1 IMPLANT
DRAPE EXTREMITY T 121X128X90 (DRAPE) ×3 IMPLANT
DRAPE INCISE IOBAN 66X45 STRL (DRAPES) ×1 IMPLANT
DRAPE PROXIMA HALF (DRAPES) ×3 IMPLANT
DRAPE U-SHAPE 47X51 STRL (DRAPES) ×3 IMPLANT
DRSG AQUACEL AG ADV 3.5X10 (GAUZE/BANDAGES/DRESSINGS) ×3 IMPLANT
DRSG AQUACEL AG ADV 3.5X14 (GAUZE/BANDAGES/DRESSINGS) ×1 IMPLANT
DURAPREP 26ML APPLICATOR (WOUND CARE) ×6 IMPLANT
ELECT CAUTERY BLADE 6.4 (BLADE) ×3 IMPLANT
ELECT REM PT RETURN 9FT ADLT (ELECTROSURGICAL) ×3
ELECTRODE REM PT RTRN 9FT ADLT (ELECTROSURGICAL) ×1 IMPLANT
FACESHIELD WRAPAROUND (MASK) ×3 IMPLANT
FACESHIELD WRAPAROUND OR TEAM (MASK) ×1 IMPLANT
GAUZE SPONGE 4X4 12PLY STRL (GAUZE/BANDAGES/DRESSINGS) ×3 IMPLANT
GLOVE BIO SURGEON STRL SZ7 (GLOVE) ×5 IMPLANT
GLOVE BIOGEL PI IND STRL 7.0 (GLOVE) ×1 IMPLANT
GLOVE BIOGEL PI IND STRL 7.5 (GLOVE) ×1 IMPLANT
GLOVE BIOGEL PI INDICATOR 7.0 (GLOVE) ×4
GLOVE BIOGEL PI INDICATOR 7.5 (GLOVE) ×4
GLOVE SS BIOGEL STRL SZ 7.5 (GLOVE) ×1 IMPLANT
GLOVE SUPERSENSE BIOGEL SZ 7.5 (GLOVE) ×4
GOWN STRL REUS W/ TWL LRG LVL3 (GOWN DISPOSABLE) ×1 IMPLANT
GOWN STRL REUS W/ TWL XL LVL3 (GOWN DISPOSABLE) ×2 IMPLANT
GOWN STRL REUS W/TWL LRG LVL3 (GOWN DISPOSABLE) ×3
GOWN STRL REUS W/TWL XL LVL3 (GOWN DISPOSABLE) ×6
HANDPIECE INTERPULSE COAX TIP (DISPOSABLE) ×3
HOOD PEEL AWAY FACE SHEILD DIS (HOOD) ×6 IMPLANT
IMMOBILIZER KNEE 22 UNIV (SOFTGOODS) ×3 IMPLANT
KIT BASIN OR (CUSTOM PROCEDURE TRAY) ×3 IMPLANT
KIT ROOM TURNOVER OR (KITS) ×3 IMPLANT
MANIFOLD NEPTUNE II (INSTRUMENTS) ×3 IMPLANT
MARKER SKIN DUAL TIP RULER LAB (MISCELLANEOUS) ×3 IMPLANT
NDL 18GX1X1/2 (RX/OR ONLY) (NEEDLE) ×1 IMPLANT
NEEDLE 18GX1X1/2 (RX/OR ONLY) (NEEDLE) ×3 IMPLANT
NS IRRIG 1000ML POUR BTL (IV SOLUTION) ×3 IMPLANT
PACK TOTAL JOINT (CUSTOM PROCEDURE TRAY) ×3 IMPLANT
PACK UNIVERSAL I (CUSTOM PROCEDURE TRAY) ×3 IMPLANT
PAD ARMBOARD 7.5X6 YLW CONV (MISCELLANEOUS) ×6 IMPLANT
SET HNDPC FAN SPRY TIP SCT (DISPOSABLE) ×1 IMPLANT
STRIP CLOSURE SKIN 1/2X4 (GAUZE/BANDAGES/DRESSINGS) ×2 IMPLANT
SUCTION FRAZIER TIP 10 FR DISP (SUCTIONS) ×3 IMPLANT
SUT MNCRL AB 3-0 PS2 18 (SUTURE) ×3 IMPLANT
SUT VIC AB 0 CT1 27 (SUTURE) ×9
SUT VIC AB 0 CT1 27XBRD ANBCTR (SUTURE) ×2 IMPLANT
SUT VIC AB 1 CT1 27 (SUTURE) ×3
SUT VIC AB 1 CT1 27XBRD ANBCTR (SUTURE) ×1 IMPLANT
SUT VIC AB 2-0 CT1 27 (SUTURE) ×6
SUT VIC AB 2-0 CT1 TAPERPNT 27 (SUTURE) ×2 IMPLANT
SYR 30ML SLIP (SYRINGE) ×3 IMPLANT
TOWEL OR 17X24 6PK STRL BLUE (TOWEL DISPOSABLE) ×3 IMPLANT
TOWEL OR 17X26 10 PK STRL BLUE (TOWEL DISPOSABLE) ×3 IMPLANT
TRAY FOLEY CATH 16FR SILVER (SET/KITS/TRAYS/PACK) ×3 IMPLANT
TUBE CONNECTING 12'X1/4 (SUCTIONS)
TUBE CONNECTING 12X1/4 (SUCTIONS) ×1 IMPLANT
YANKAUER SUCT BULB TIP NO VENT (SUCTIONS) ×3 IMPLANT

## 2015-04-08 NOTE — Anesthesia Postprocedure Evaluation (Signed)
Anesthesia Post Note  Patient: Annette Sharp  Procedure(s) Performed: Procedure(s) (LRB): LEFT TOTAL KNEE ARTHROPLASTY (Left)  Patient location during evaluation: PACU Anesthesia Type: Spinal and Regional Level of consciousness: oriented, awake and alert and awake Pain management: pain level controlled Vital Signs Assessment: post-procedure vital signs reviewed and stable Respiratory status: spontaneous breathing, respiratory function stable and patient connected to nasal cannula oxygen Cardiovascular status: blood pressure returned to baseline and stable Postop Assessment: no headache, no backache and spinal receding Anesthetic complications: no    Last Vitals:  Filed Vitals:   04/08/15 1150 04/08/15 1232  BP: 114/51 129/58  Pulse: 73 73  Temp:  36.4 C  Resp: 15 17    Last Pain: There were no vitals filed for this visit.               Catalina Gravel

## 2015-04-08 NOTE — Anesthesia Procedure Notes (Addendum)
Anesthesia Regional Block:  Adductor canal block  Pre-Anesthetic Checklist: ,, timeout performed, Correct Patient, Correct Site, Correct Laterality, Correct Procedure, Correct Position, site marked, Risks and benefits discussed,  Surgical consent,  Pre-op evaluation,  At surgeon's request and post-op pain management  Laterality: Left  Prep: Maximum Sterile Barrier Precautions used and Dura Prep       Needles:  Injection technique: Single-shot  Needle Type: Echogenic Stimulator Needle     Needle Length: 10cm 10 cm Needle Gauge: 21 and 21 G    Additional Needles:  Procedures: ultrasound guided (picture in chart) Adductor canal block Narrative:  Start time: 04/08/2015 8:02 AM End time: 04/08/2015 8:12 AM Injection made incrementally with aspirations every 5 mL.  Performed by: Personally  Anesthesiologist: Alexis Frock  Additional Notes: 25cc .5% marcaine with epi, no pain on injection, negative aspirations   Spinal Patient location during procedure: OR Start time: 04/08/2015 8:49 AM End time: 04/08/2015 8:59 AM Staffing Anesthesiologist: Alexis Frock Performed by: anesthesiologist  Preanesthetic Checklist Completed: patient identified, site marked, surgical consent, pre-op evaluation, timeout performed, IV checked, risks and benefits discussed and monitors and equipment checked Spinal Block Patient position: sitting Prep: DuraPrep Patient monitoring: heart rate, continuous pulse ox and blood pressure Approach: midline Location: L4-5 Injection technique: single-shot Needle Needle type: Whitacre and Introducer  Needle gauge: 24 G Needle length: 9 cm Needle insertion depth: 5.5 cm Assessment Sensory level: T6 Additional Notes Negative paresthesia. Negative blood return. Positive free-flowing CSF. Expiration date of kit checked and confirmed. Patient tolerated procedure well, without complications.

## 2015-04-08 NOTE — Interval H&P Note (Signed)
History and Physical Interval Note:  04/08/2015 8:47 AM  Annette Sharp  has presented today for surgery, with the diagnosis of PRIMARY LOCALIZED OA LEFT KNEE  The various methods of treatment have been discussed with the patient and family. After consideration of risks, benefits and other options for treatment, the patient has consented to  Procedure(s): LEFT TOTAL KNEE ARTHROPLASTY (Left) as a surgical intervention .  The patient's history has been reviewed, patient examined, no change in status, stable for surgery.  I have reviewed the patient's chart and labs.  Questions were answered to the patient's satisfaction.     Elsie Saas A

## 2015-04-08 NOTE — Evaluation (Signed)
Physical Therapy Evaluation Patient Details Name: ANGELISSE COLLIER MRN: [id number] DOB: 1940-12-07 Today's Date: 04/08/2015   History of Present Illness  Admitted for LTKA;  has a past medical history of  Hypertension; Back pain; Stroke ([place]); Depression; Chronic renal insufficiency; Coronary artery disease; Coronary atherosclerosis of native coronary artery; Primary localized osteoarthritis of left knee; Sleep apnea; COPD (chronic obstructive pulmonary disease) (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  Pt will benefit from skilled PT to increase their independence and safety with mobility to allow discharge to the venue listed below.      Follow Up Recommendations Home health PT;Supervision/Assistance - 24 hour    Equipment Recommendations  None recommended by PT    Recommendations for Other Services       Precautions / Restrictions Precautions Precautions: Knee Precaution Booklet Issued: Yes (comment) Precaution Comments: Pt educated to not allow any pillow or bolster under knee for healing with optimal range of motion.  Required Braces or Orthoses: Knee Immobilizer - Left Knee Immobilizer - Left: On when out of bed or walking;Discontinue once straight leg raise with < 10 degree lag Restrictions Weight Bearing Restrictions: Yes LLE Weight Bearing: Weight bearing as tolerated      Mobility  Bed Mobility Overal bed mobility: Needs Assistance Bed Mobility: Supine to Sit     Supine to sit: Min assist     General bed mobility comments: min assist for LLE support off of bed  Transfers Overall transfer level: Needs assistance Equipment used: Rolling walker (2 wheeled) Transfers: Sit to/from Stand Sit to Stand: Min guard         General transfer comment: Cues for hand placement and  safety  Ambulation/Gait Ambulation/Gait assistance: Min assist Ambulation Distance (Feet): 5 Feet Assistive device: Rolling walker (2 wheeled) Gait Pattern/deviations: Step-to pattern     General Gait Details: Cues for gait sequence and to activate quad for stance stability  Stairs            Wheelchair Mobility    Modified Rankin (Stroke Patients Only)       Balance                                             Pertinent Vitals/Pain Pain Assessment: 0-10 Pain Score: 7  Pain Location: L knee Pain Descriptors / Indicators: Aching Pain Intervention(s): Limited activity within patient's tolerance;Monitored during session;Premedicated before session;Repositioned    Home Living Family/patient expects to be discharged to:: Private residence Living Arrangements: Spouse/significant other Available Help at Discharge: Family;Available 24 hours/day Type of Home: House Home Access: Ramped entrance     Home Layout: One level Home Equipment: Walker - 2 wheels;Cane - single point;Bedside commode;Shower seat      Prior Function Level of Independence: Independent               Hand Dominance        Extremity/Trunk Assessment   Upper Extremity Assessment: Defer to OT evaluation           Lower Extremity Assessment: LLE deficits/detail   LLE Deficits / Details: Grossly decr AROM and strength, limited by pain postop; quad activation present, able to perform straight leg raise     Communication   Communication: No  difficulties  Cognition Arousal/Alertness: Awake/alert Behavior During Therapy: WFL for tasks assessed/performed Overall Cognitive Status: Within Functional Limits for tasks assessed                      General Comments      Exercises Total Joint Exercises Quad Sets: AROM;Left;5 reps Heel Slides: AAROM;Left (2) Straight Leg Raises: AAROM;Left;5 reps      Assessment/Plan    PT Assessment Patient needs  continued PT services  PT Diagnosis Difficulty walking;Acute pain   PT Problem List Decreased strength;Decreased range of motion;Decreased activity tolerance;Decreased balance;Decreased mobility;Decreased knowledge of use of DME;Decreased knowledge of precautions;Pain  PT Treatment Interventions DME instruction;Gait training;Functional mobility training;Therapeutic activities;Therapeutic exercise;Patient/family education   PT Goals (Current goals can be found in the Care Plan section) Acute Rehab PT Goals Patient Stated Goal: sleep through the night without pain waking her PT Goal Formulation: With patient Time For Goal Achievement: 04/15/15 Potential to Achieve Goals: Good    Frequency 7X/week   Barriers to discharge        Co-evaluation               End of Session Equipment Utilized During Treatment: Gait belt Activity Tolerance: Patient tolerated treatment well Patient left: in chair;with call bell/phone within reach;with family/visitor present Nurse Communication: Mobility status         Time: [id number] PT Time Calculation (min) (ACUTE ONLY): 18 min   Charges:   PT Evaluation $PT Eval Low Complexity: 1 Procedure     PT G CodesQuin Hoop 04/08/2015, 5:03 PM  Roney Marion, [place] Pager [phone] Office [phone]

## 2015-04-08 NOTE — Progress Notes (Signed)
Utilization review completed.  

## 2015-04-08 NOTE — Op Note (Signed)
MRN:     [id number] DOB/AGE:    Jan 01, 1941 / 75 y.o.       OPERATIVE REPORT    DATE OF PROCEDURE:  04/08/2015       PREOPERATIVE DIAGNOSIS:   PRIMARY LOCALIZED OA LEFT KNEE      Estimated body mass index is 33.46 kg/(m^2) as calculated from the following:   Height as of this encounter: 5\' 2"  (1.575 m).   Weight as of this encounter: 83.008 kg (183 lb).                                                        POSTOPERATIVE DIAGNOSIS:   SAME                                                                      PROCEDURE:  Procedure(s): LEFT TOTAL KNEE ARTHROPLASTY Using Depuy Attune RP implants #5 Femur, #4Tibia, 79mm  RP bearing, 32 Patella     SURGEON: Karyna Bessler A    ASSISTANT:  Kirstin Shepperson PA-C   (Present and scrubbed throughout the case, critical for assistance with exposure, retraction, instrumentation, and closure.)         ANESTHESIA: Spinal with Adductor Nerve Block     TOURNIQUET TIME: [id number]   COMPLICATIONS:  None     SPECIMENS: None   INDICATIONS FOR PROCEDURE: The patient has  DJD LEFT KNEE, varus deformities, XR shows bone on bone arthritis. Patient has failed all conservative measures including anti-inflammatory medicines, narcotics, attempts at  exercise and weight loss, cortisone injections and viscosupplementation.  Risks and benefits of surgery have been discussed, questions answered.   DESCRIPTION OF PROCEDURE: The patient identified by armband, received  right femoral nerve block and IV antibiotics, in the holding area at [facility]. Patient taken to the operating room, appropriate anesthetic  monitors were attached General endotracheal anesthesia induced with  the patient in supine position, Foley catheter was inserted. Tourniquet  applied high to the operative thigh. Lateral post and foot positioner  applied to the table, the lower extremity was then prepped and draped  in usual sterile fashion from the ankle to the tourniquet. Time-out procedure  was performed. The limb was wrapped with an Esmarch bandage and the tourniquet inflated to 365 mmHg. We began the operation by making the anterior midline incision starting at handbreadth above the patella going over the patella 1 cm medial to and  4 cm distal to the tibial tubercle. Small bleeders in the skin and the  subcutaneous tissue identified and cauterized. Transverse retinaculum was incised and reflected medially and a medial parapatellar arthrotomy was accomplished. the patella was everted and theprepatellar fat pad resected. The superficial medial collateral  ligament was then elevated from anterior to posterior along the proximal  flare of the tibia and anterior half of the menisci resected. The knee was hyperflexed exposing bone on bone arthritis. Peripheral and notch osteophytes as well as the cruciate ligaments were then resected. We continued to  work our way around posteriorly along the proximal tibia, and externally  rotated the tibia subluxing  it out from underneath the femur. A McHale  retractor was placed through the notch and a lateral Hohmann retractor  placed, and we then drilled through the proximal tibia in line with the  axis of the tibia followed by an intramedullary guide rod and 2-degree  posterior slope cutting guide. The tibial cutting guide was pinned into place  allowing resection of 4 mm of bone medially and about 6 mm of bone  laterally because of her varus deformity. Satisfied with the tibial resection, we then  entered the distal femur 2 mm anterior to the PCL origin with the  intramedullary guide rod and applied the distal femoral cutting guide  set at 14mm, with 5 degrees of valgus. This was pinned along the  epicondylar axis. At this point, the distal femoral cut was accomplished without difficulty. We then sized for a #5 femoral component and pinned the guide in 3 degrees of external rotation.The chamfer cutting guide was pinned into place. The anterior,  posterior, and chamfer cuts were accomplished without difficulty followed by  the  RP box cutting guide and the box cut. We also removed posterior osteophytes from the posterior femoral condyles. At this  time, the knee was brought into full extension. We checked our  extension and flexion gaps and found them symmetric at 37mm.  The patella thickness measured at 21 mm. We set the cutting guide at 13 and removed the posterior 8 mm  of the patella sized for 32 button and drilled the lollipop. The knee  was then once again hyperflexed exposing the proximal tibia. We sized for a #4 tibial base plate, applied the smokestack and the conical reamer followed by the the Delta fin keel punch. We then hammered into place the  RP trial femoral component, inserted a 1 trial bearing, trial patellar button, and took the knee through range of motion from 0-130 degrees. No thumb pressure was required for patellar  tracking. At this point, all trial components were removed, Cement was mixed and applied to all bony metallic mating surfaces except for the posterior condyles of the femur itself. In order, we  hammered into place the tibial tray and removed excess cement, the femoral component and removed excess cement, a 32mm  RP bearing  was inserted, and the knee brought to full extension with compression.  The patellar button was clamped into place, and excess cement  removed. While the cement cured the wound was irrigated out with normal saline solution pulse lavage.. Ligament stability and patellar tracking were checked and found to be excellent.. The parapatellar arthrotomy was closed with  #1 Vicryl suture. The subcutaneous tissue with 0 and 2-0 undyed  Vicryl suture, and 4-0 Monocryl.. A dressing of Aquaseal,  4 x 4, dressing sponges, Webril, and Ace wrap applied. Needle and sponge count were correct times 2.The patient awakened, extubated, and taken to recovery room without difficulty. Vascular status was normal,  pulses 2+ and symmetric.   Gladyse Corvin A 04/08/2015, 10:48 AM

## 2015-04-08 NOTE — Progress Notes (Signed)
Orthopedic Tech Progress Note Patient Details:  Annette Sharp 12/28/1940 [id number]  CPM Left Knee CPM Left Knee: On Left Knee Flexion (Degrees): 90 Left Knee Extension (Degrees): 0 Additional Comments: Trapeze bar and foot roll   [name] 04/08/2015, 1:10 PM

## 2015-04-08 NOTE — Progress Notes (Signed)
RT note: Placed patient on CPAP 10 cmH2O with full face mask per RCP protocol. Patient tolerating well at this time.

## 2015-04-08 NOTE — Transfer of Care (Signed)
Immediate Anesthesia Transfer of Care Note  Patient: Annette Sharp  Procedure(s) Performed: Procedure(s): LEFT TOTAL KNEE ARTHROPLASTY (Left)  Patient Location: PACU  Anesthesia Type:Spinal  Level of Consciousness: awake and alert   Airway & Oxygen Therapy: Patient Spontanous Breathing and Patient connected to face mask oxygen  Post-op Assessment: Report given to RN and Post -op Vital signs reviewed and stable  Post vital signs: Reviewed and stable  Last Vitals:  Filed Vitals:   04/08/15 0816 04/08/15 0820  BP:  122/34  Pulse: 67 70  Temp:    Resp: 19 18    Complications: No apparent anesthesia complications

## 2015-04-09 ENCOUNTER — Encounter (HOSPITAL_COMMUNITY): Payer: Self-pay | Admitting: Orthopedic Surgery

## 2015-04-09 LAB — CBC
HEMATOCRIT: 32.1 % — AB (ref 36.0–46.0)
HEMOGLOBIN: 11 g/dL — AB (ref 12.0–15.0)
MCH: 31.2 pg (ref 26.0–34.0)
MCHC: 34.3 g/dL (ref 30.0–36.0)
MCV: 90.9 fL (ref 78.0–100.0)
Platelets: 230 10*3/uL (ref 150–400)
RBC: 3.53 MIL/uL — ABNORMAL LOW (ref 3.87–5.11)
RDW: 12.5 % (ref 11.5–15.5)
WBC: 15.9 10*3/uL — ABNORMAL HIGH (ref 4.0–10.5)

## 2015-04-09 LAB — BASIC METABOLIC PANEL
ANION GAP: 11 (ref 5–15)
BUN: 14 mg/dL (ref 6–20)
CHLORIDE: 100 mmol/L — AB (ref 101–111)
CO2: 26 mmol/L (ref 22–32)
CREATININE: 1.36 mg/dL — AB (ref 0.44–1.00)
Calcium: 8.7 mg/dL — ABNORMAL LOW (ref 8.9–10.3)
GFR calc non Af Amer: 38 mL/min — ABNORMAL LOW (ref >60)
GFR, EST AFRICAN AMERICAN: 44 mL/min — AB (ref >60)
GLUCOSE: 169 mg/dL — AB (ref 65–99)
Potassium: 3.8 mmol/L (ref 3.5–5.1)
Sodium: 137 mmol/L (ref 135–145)

## 2015-04-09 NOTE — Progress Notes (Signed)
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 Pt currently presents with acute L knee pain which limited session today and required min guard assist for all functional transfers and ADLs. Pt will benefit from continued acute OT to increase independence and safety with ADLs and mobility to allow for safe discharge home with assistance from family. No OT follow up or DME recommendations at this time.     Follow Up Recommendations  No OT follow up;Supervision - Intermittent    Equipment Recommendations  None recommended by OT    Recommendations for Other Services       Precautions / Restrictions Precautions Precautions: Knee Precaution Booklet Issued: No Precaution Comments:  Required Braces or Orthoses: Knee Immobilizer - Left Knee Immobilizer - Left:  (d/c by PTA ) Restrictions Weight Bearing Restrictions: Yes LLE Weight Bearing: Weight bearing as tolerated      Mobility Bed Mobility Overal bed mobility: Needs Assistance Bed Mobility: Supine to Sit     Supine to sit: Min assist     General bed mobility comments: Pt up in chair on OT arrival  Transfers Overall transfer level: Needs assistance Equipment used: Rolling walker (2 wheeled) Transfers: Sit to/from Stand Sit to Stand: Min guard         General transfer comment: Good demonstration of safe hand placement. Cues for proper RW  use and min guard for safety due to increased pain and some unsteadiness.    Balance Overall balance assessment: Needs assistance Sitting-balance support: No upper extremity supported;Feet supported Sitting balance-Leahy Scale: Good     Standing balance support: Bilateral upper extremity supported;During functional activity Standing balance-Leahy Scale: Poor Standing balance comment: Required UE support on sink counter to complete grooming tasks due to increased pain                            ADL Overall ADL's : Needs assistance/impaired     Grooming: Wash/dry hands;Min Dispensing optician: Min guard;Ambulation;BSC;RW Toilet Transfer Details (indicate cue type and reason): BSC over toilet Toileting- Clothing Manipulation and Hygiene: Min guard;Sit to/from stand       Functional mobility during ADLs: Min guard;Rolling walker General ADL Comments: Min guard for safety due to increase pain and some unsteadiness ambulating to/from bathroom. Discussed step sequence for shower transfer (will practice tomorrow).      Vision Vision Assessment?: No apparent visual deficits   Perception     Praxis      Pertinent Vitals/Pain Pain Assessment: 0-10 Pain Score: 7  Pain Location: L knee Pain Descriptors / Indicators: Aching;Sore Pain Intervention(s): Limited activity within patient's tolerance;Monitored during session;Premedicated before session;Repositioned;Ice applied     Hand Dominance Right   Extremity/Trunk Assessment Upper Extremity Assessment Upper Extremity Assessment: Overall WFL for tasks assessed   Lower Extremity Assessment Lower Extremity Assessment: LLE deficits/detail LLE Deficits / Details: Grossly decr AROM  and strength, limited by pain postop; quad activation present, able to perform straight leg raise       Communication Communication Communication: No difficulties   Cognition Arousal/Alertness:  Awake/alert Behavior During Therapy: WFL for tasks assessed/performed Overall Cognitive Status: Within Functional Limits for tasks assessed                     General Comments       Exercises Exercises: Total Joint     Shoulder Instructions      Home Living Family/patient expects to be discharged to:: Private residence Living Arrangements: Spouse/significant other;Children Available Help at Discharge: Family;Available 24 hours/day Type of Home: House Home Access: Ramped entrance     Home Layout: One level     Bathroom Shower/Tub: Walk-in shower;Door   [employer] Toilet: Standard     Home Equipment: Environmental consultant - 2 wheels;Cane - single point;Bedside commode;Shower seat - built in;Hand held shower head   Additional Comments: Daughter will be staying with pt to assist 24/7      Prior Functioning/Environment Level of Independence: Independent             OT Diagnosis: Acute pain   OT Problem List: Decreased range of motion;Decreased strength;Decreased activity tolerance;Impaired balance (sitting and/or standing);Decreased coordination;Decreased safety awareness;Decreased knowledge of use of DME or AE;Decreased knowledge of precautions;Pain   OT Treatment/Interventions: Self-care/ADL training;Therapeutic exercise;Energy conservation;DME and/or AE instruction;Therapeutic activities;Patient/family education;Balance training    OT Goals(Current goals can be found in the care plan section) Acute Rehab OT Goals Patient Stated Goal: to go home OT Goal Formulation: With patient Potential to Achieve Goals: Good ADL Goals Pt Will Perform Lower Body Bathing: with supervision;sit to/from stand Pt Will Perform Lower Body Dressing: with supervision;sit to/from stand Pt Will Transfer to Toilet: with supervision;ambulating;bedside commode Pt Will Perform Toileting - Clothing Manipulation and hygiene: with supervision;sit to/from stand Pt Will Perform Tub/Shower Transfer: Shower  transfer;with supervision;ambulating;3 in 1;rolling walker  OT Frequency: Min 2X/week   Barriers to D/C:            Co-evaluation              End of Session Equipment Utilized During Treatment: Gait belt;Rolling walker CPM Left Knee CPM Left Knee: Off Additional Comments: Pillow under heels and ice applied (pt had been in sero degree foam for an hour on arrival and was uanble to tolerate it after session) Nurse Communication: Mobility status  Activity Tolerance: Patient limited by pain Patient left: in chair;with call bell/phone within reach;with family/visitor present   Time: [id number] OT Time Calculation (min): 27 min Charges:  OT General Charges $OT Visit: 1 Procedure OT Evaluation $OT Eval Moderate Complexity: 1 Procedure OT Treatments $Self Care/Home Management : 8-22 mins G-Codes:    Redmond Baseman, OTR/L Pager: [phone] 04/09/2015, 11:47 AM

## 2015-04-09 NOTE — Progress Notes (Signed)
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  Pt received stair training and demonstrated ability to ascend/descend 10 steps safely with min guard. Overall mobility level of min guard/supervision this session. From a mobility standpoint anticipate patient will be ready for DC home when medically ready.     Follow Up Recommendations  Home health PT;Supervision/Assistance - 24 hour     Equipment Recommendations  None recommended by PT    Recommendations for Other Services       Precautions / Restrictions Precautions Precautions: Knee Precaution Booklet Issued: Yes (comment) Required Braces or Orthoses: Knee Immobilizer - Left Knee Immobilizer - Left: Other (comment) (discontinued; SLR with <10 degree lag this session) Restrictions Weight Bearing Restrictions: Yes LLE Weight Bearing: Weight bearing as tolerated    Mobility  Bed Mobility Overal bed mobility: Needs Assistance Bed Mobility: Supine to Sit;Sit to Supine     Supine to sit: Min guard Sit to supine: Min guard   General bed mobility comments: no use of rails or physical assistance needed; HOB flat; min guard for safety  Transfers Overall transfer level: Needs assistance Equipment used: Rolling walker (2 wheeled) Transfers: Sit to/from Stand Sit to Stand: Supervision         General transfer comment: vc for hand placement from EOB; good  technique  Ambulation/Gait Ambulation/Gait assistance: Supervision Ambulation Distance (Feet): 100 Feet Assistive device: Rolling walker (2 wheeled) Gait Pattern/deviations: Step-through pattern;Decreased stride length;Antalgic   Gait velocity interpretation: Below normal speed for age/gender General Gait Details: pt with improved gait mechanics and ability for increased WS to L LE; min cues for upright posture   Stairs Stairs: Yes Stairs assistance: Min guard Stair Management: One rail Left;Sideways Number of Stairs: 10 General stair comments: educated pt on sequencing with demonstration; pt with no unsteadiness; min guard for safety; verbalized proper technique at end of session  Wheelchair Mobility    Modified Rankin (Stroke Patients Only)       Balance Overall balance assessment: Needs assistance Sitting-balance support: Feet supported Sitting balance-Leahy Scale: Good     Standing balance support: No upper extremity supported Standing balance-Leahy Scale: Fair Standing balance comment: static standing only                    Cognition Arousal/Alertness: Awake/alert Behavior During Therapy: WFL for tasks assessed/performed Overall Cognitive Status: Within Functional Limits for tasks assessed                      Exercises      General Comments General comments (skin integrity, edema, etc.): Pt's daughter present for session      Pertinent Vitals/Pain Pain Assessment: 0-10 Pain Score: 1  Pain Location: L knee Pain Descriptors / Indicators: Sore Pain Intervention(s): Monitored during session;Premedicated before session;Repositioned    Home Living Family/patient expects to be discharged to:: Private residence Living Arrangements: Spouse/significant other;Children Available Help at Discharge: Family;Available 24 hours/day Type of Home: House Home Access: Ramped entrance   Home  Layout: One level Home Equipment: Walker - 2 wheels;Cane -  single point;Bedside commode;Shower seat - built in;Hand held shower head Additional Comments: Daughter will be staying with pt to assist 24/7    Prior Function Level of Independence: Independent          PT Goals (current goals can now be found in the care plan section) Acute Rehab PT Goals Patient Stated Goal: none stated PT Goal Formulation: With patient Time For Goal Achievement: 04/15/15 Potential to Achieve Goals: Good Progress towards PT goals: Progressing toward goals    Frequency  7X/week    PT Plan Current plan remains appropriate    Co-evaluation             End of Session Equipment Utilized During Treatment: Gait belt Activity Tolerance: Patient tolerated treatment well Patient left: with call bell/phone within reach;with family/visitor present;in bed     Time: 1500-1520 PT Time Calculation (min) (ACUTE ONLY): 20 min  Charges:  $Gait Training: 8-22 mins                    G Codes:      [place] April, PTA Pager: [phone]   04/09/2015, 3:32 PM

## 2015-04-09 NOTE — Progress Notes (Signed)
Subjective: 1 Day Post-Op Procedure(s) (LRB): LEFT TOTAL KNEE ARTHROPLASTY (Left) Patient reports pain as 3 on 0-10 scale.    Objective: Vital signs in last 24 hours: Temp:  [97.5 F (36.4 C)-98.1 F (36.7 C)] 97.6 F (36.4 C) (01/10 0627) Pulse Rate:  [69-79] 76 (01/10 0627) Resp:  [15-18] 17 (01/10 0627) BP: (114-161)/(51-59) 161/54 mmHg (01/10 0627) SpO2:  [94 %-100 %] 97 % (01/10 0627)  Intake/Output from previous day: 01/09 0701 - 01/10 0700 In: 2835 [I.V.:2785; IV Piggyback:50] Out: 1400 [Urine:1350; Blood:50] Intake/Output this shift: Total I/O In: -  Out: 700 [Urine:700]   Recent Labs  04/09/15 0436  HGB 11.0*    Recent Labs  04/09/15 0436  WBC 15.9*  RBC 3.53*  HCT 32.1*  PLT 230    Recent Labs  04/09/15 0436  NA 137  K 3.8  CL 100*  CO2 26  BUN 14  CREATININE 1.36*  GLUCOSE 169*  CALCIUM 8.7*   No results for input(s): LABPT, INR in the last 72 hours.  ABD soft Neurovascular intact Sensation intact distally Incision: dressing C/D/I  Assessment/Plan: 1 Day Post-Op Procedure(s) (LRB): LEFT TOTAL KNEE ARTHROPLASTY (Left) Advance diet Up with therapy Plan for discharge tomorrow  Linda Hedges 04/09/2015, 10:35 AM

## 2015-04-09 NOTE — Progress Notes (Signed)
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 Overall mobility level of min A/min guard for bed mobility and transfers and supervision for gait training. Pt with improved gait mechanics and ability to ambulate 100 ft before fatigued. Pt tolerated exercises well. Patient needs to practice stairs next session.     Follow Up Recommendations  Home health PT;Supervision/Assistance - 24 hour     Equipment Recommendations  None recommended by PT    Recommendations for Other Services       Precautions / Restrictions Precautions Precautions: Knee Precaution Booklet Issued: Yes (comment) Precaution Comments:  Required Braces or Orthoses: Knee Immobilizer - Left Knee Immobilizer - Left: Other (comment) (discontinued; SLR with <10 degree lag this session) Restrictions Weight Bearing Restrictions: Yes LLE Weight Bearing: Weight bearing as tolerated    Mobility  Bed Mobility Overal bed mobility: Needs Assistance Bed Mobility: Supine to Sit     Supine to sit: Min assist     General bed mobility comments: HOB flat with min use of bedrail; min A to bring L LE to EOB with pt able to go most of the way without assist  Transfers Overall transfer level: Needs assistance Equipment used: Rolling walker (2 wheeled) Transfers: Sit to/from Stand Sit to Stand: Min guard         General transfer comment: vc for hand placement and  min guard for safety  Ambulation/Gait Ambulation/Gait assistance: Supervision Ambulation Distance (Feet): 100 Feet Assistive device: Rolling walker (2 wheeled) Gait Pattern/deviations: Step-through pattern;Decreased step length - right;Decreased stance time - left;Trunk flexed   Gait velocity interpretation: Below normal speed for age/gender General Gait Details: vc for increased L knee ext before WS to L side; vc for maintaining upright posture   Stairs            Wheelchair Mobility    Modified Rankin (Stroke Patients Only)       Balance Overall balance assessment: Needs assistance Sitting-balance support: Feet supported Sitting balance-Leahy Scale: Good     Standing balance support: Bilateral upper extremity supported Standing balance-Leahy Scale: Fair                      Cognition Arousal/Alertness: Awake/alert Behavior During Therapy: WFL for tasks assessed/performed Overall Cognitive Status: Within Functional Limits for tasks assessed                      Exercises Total Joint Exercises Quad Sets: AROM;Left;10 reps;Supine Short Arc Quad: AROM;10 reps;Left;Supine Heel Slides: Left;AROM;10 reps;Supine (2) Straight Leg Raises: Left;AROM;10 reps;Supine Goniometric ROM: 0-70    General Comments        Pertinent Vitals/Pain Pain Assessment: 0-10 Pain Score: 10-Worst pain ever Pain Location: L knee Pain Descriptors / Indicators: Sore Pain Intervention(s): Limited activity within patient's tolerance;Monitored during session;Premedicated before session (pt reported decreased pain throughout session)    Home Living  Prior Function            PT Goals (current goals can now be found in the care plan section) Acute Rehab PT Goals Patient Stated Goal: none stated PT Goal Formulation: With patient Time For Goal Achievement: 04/15/15 Potential to Achieve Goals: Good Progress towards PT goals: Progressing  toward goals    Frequency  7X/week    PT Plan Current plan remains appropriate    Co-evaluation             End of Session Equipment Utilized During Treatment: Gait belt Activity Tolerance: Patient tolerated treatment well Patient left: in chair;with call bell/phone within reach;with family/visitor present     Time: [id number] PT Time Calculation (min) (ACUTE ONLY): 40 min  Charges:  $Gait Training: 8-22 mins $Therapeutic Exercise: 8-22 mins $Therapeutic Activity: 8-22 mins                    G Codes:      Salina April, PTA Pager: [phone]   04/09/2015, 11:23 AM

## 2015-04-10 DIAGNOSIS — M1712 Unilateral primary osteoarthritis, left knee: Secondary | ICD-10-CM | POA: Diagnosis not present

## 2015-04-10 LAB — CBC
HCT: 28.9 % — ABNORMAL LOW (ref 36.0–46.0)
Hemoglobin: 10 g/dL — ABNORMAL LOW (ref 12.0–15.0)
MCH: 31.3 pg (ref 26.0–34.0)
MCHC: 34.6 g/dL (ref 30.0–36.0)
MCV: 90.6 fL (ref 78.0–100.0)
PLATELETS: 209 10*3/uL (ref 150–400)
RBC: 3.19 MIL/uL — AB (ref 3.87–5.11)
RDW: 12.5 % (ref 11.5–15.5)
WBC: 13.3 10*3/uL — AB (ref 4.0–10.5)

## 2015-04-10 LAB — BASIC METABOLIC PANEL
ANION GAP: 10 (ref 5–15)
BUN: 16 mg/dL (ref 6–20)
CALCIUM: 8.8 mg/dL — AB (ref 8.9–10.3)
CO2: 30 mmol/L (ref 22–32)
Chloride: 98 mmol/L — ABNORMAL LOW (ref 101–111)
Creatinine, Ser: 1.13 mg/dL — ABNORMAL HIGH (ref 0.44–1.00)
GFR calc Af Amer: 55 mL/min — ABNORMAL LOW (ref >60)
GFR, EST NON AFRICAN AMERICAN: 47 mL/min — AB (ref >60)
GLUCOSE: 146 mg/dL — AB (ref 65–99)
POTASSIUM: 4 mmol/L (ref 3.5–5.1)
SODIUM: 138 mmol/L (ref 135–145)

## 2015-04-10 MED ORDER — ASPIRIN 325 MG PO TBEC: DELAYED_RELEASE_TABLET | ORAL | Status: DC

## 2015-04-10 MED ORDER — OXYCODONE HCL 5 MG PO TABS: ORAL_TABLET | ORAL | Status: DC

## 2015-04-10 MED ORDER — DOCUSATE SODIUM 100 MG PO CAPS: ORAL_CAPSULE | ORAL | Status: DC

## 2015-04-10 MED ORDER — POLYETHYLENE GLYCOL 3350 17 G PO PACK: PACK | ORAL | Status: DC

## 2015-04-10 NOTE — Progress Notes (Signed)
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  Overall mobility level of supervision this session. Pt recalled precaution. Reviewed HEP and use of CPM and bone foam. From a mobility standpoint anticipate patient will be ready for DC home medically ready.     Follow Up Recommendations  Home health PT;Supervision/Assistance - 24 hour     Equipment Recommendations  None recommended by PT    Recommendations for Other Services       Precautions / Restrictions Precautions Precautions: Knee Restrictions Weight Bearing Restrictions: Yes LLE Weight Bearing: Weight bearing as tolerated    Mobility  Bed Mobility Overal bed mobility: Needs Assistance Bed Mobility: Supine to Sit;Sit to Supine     Supine to sit: Modified independent (Device/Increase time) Sit to supine: Supervision   General bed mobility comments: mod I for supine to sit with HOB flat and no use of bedrails; supervision for safety with sit to supine; no physical assist needed with carry over of technique  Transfers Overall transfer level: Needs assistance Equipment used: Rolling walker (2 wheeled) Transfers: Sit to/from Stand Sit to Stand: Supervision         General transfer comment: carry over of hand placement and technique; supervision for safety  Ambulation/Gait Ambulation/Gait assistance: Supervision Ambulation Distance (Feet): 200  Feet Assistive device: Rolling walker (2 wheeled) Gait Pattern/deviations: Step-through pattern;Antalgic;Trunk flexed   Gait velocity interpretation: Below normal speed for age/gender General Gait Details: pt with antalgic gait initially with improved ability to WS to L LE after ~58ft; min vc for upright posture and position of RW    Stairs Stairs: Yes Stairs assistance: Min guard Stair Management: One rail Left;Sideways Number of Stairs: 10 General stair comments: carry over of technique with min cues needed for first step; pt with good safety awareness and no unsteadiness  Wheelchair Mobility    Modified Rankin (Stroke Patients Only)       Balance Overall balance assessment: Needs assistance Sitting-balance support: No upper extremity supported Sitting balance-Leahy Scale: Good     Standing balance support: Single extremity supported Standing balance-Leahy Scale: Fair                      Cognition Arousal/Alertness: Awake/alert Behavior During Therapy: WFL for tasks assessed/performed Overall Cognitive Status: Within Functional Limits for tasks assessed                      Exercises Total Joint Exercises Quad Sets: AROM;Left;10 reps;Supine Heel Slides: Left;AROM;10 reps;Supine Straight Leg Raises: Left;AROM;10 reps;Supine Goniometric ROM: 0-75    General Comments        Pertinent Vitals/Pain Pain Assessment: 0-10 Pain Score: 6  Pain Location: L knee with activity Pain Descriptors / Indicators: Sore Pain Intervention(s): Limited activity within patient's tolerance;Monitored during session;Premedicated before session;Repositioned    Home Living                      Prior Function  PT Goals (current goals can now be found in the care plan section) Acute Rehab PT Goals Patient Stated Goal: to go home PT Goal Formulation: With patient Time For Goal Achievement: 04/15/15 Potential to Achieve Goals: Good Progress  towards PT goals: Progressing toward goals    Frequency  7X/week    PT Plan Current plan remains appropriate    Co-evaluation             End of Session Equipment Utilized During Treatment: Gait belt Activity Tolerance: Patient tolerated treatment well Patient left: with call bell/phone within reach;with family/visitor present;in bed     Time: [id number] PT Time Calculation (min) (ACUTE ONLY): 21 min  Charges:  $Gait Training: 8-22 mins                    G Codes:      Salina April, PTA Pager: [phone]   04/10/2015, 1:10 PM

## 2015-04-10 NOTE — Discharge Summary (Signed)
Patient ID: Annette Sharp MRN: [id number] DOB/AGE: Oct 24, 1940 75 y.o.  Admit date: 04/08/2015 Discharge date: 04/10/2015  Admission Diagnoses:  Principal Problem:   Primary localized osteoarthritis of left knee Active Problems:   Hyperlipidemia   Overweight   Essential hypertension   Angina decubitus (HCC)   CAD, NATIVE VESSEL   Chronic kidney disease   DJD (degenerative joint disease) of knee   Discharge Diagnoses:  Same  Past Medical History  Diagnosis Date  . Overweight(278.02)     OBESITY  . Hyperlipidemia   . Other and unspecified angina pectoris     ANGINA  . Hypertension   . Back pain   . Peptic ulcer disease   . Thyroid disease     TREATED HYPOTHYROIDISM  . 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 Stroke ([place])   . Depression   . Chronic renal insufficiency     GFR 45 ML'S PER MINUTE  . Coronary artery disease     S/P CYPHER DRUG-ELUTING STENTS TO RIGHT CORONARY ARTERY JUNE 2007  . Coronary atherosclerosis of native coronary artery     NEGATIVE LEXISCAN FOR ISCHEMIA MARCH 23,2010 NORMAL LV FUNCTTION  . Primary localized osteoarthritis of left knee   . Sleep apnea     SEVERE OBSTRUCTIVE    cpap  . COPD (chronic obstructive pulmonary disease) ([place])   . Anxiety   . Hypothyroidism     Surgeries: Procedure(s): LEFT TOTAL KNEE ARTHROPLASTY on 04/08/2015   Consultants:    Discharged Condition: Improved  Hospital Course: Annette Sharp is an 75 y.o. female who was admitted 04/08/2015 for operative treatment ofPrimary localized osteoarthritis of left knee. Patient has severe unremitting pain that affects sleep, daily activities, and work/hobbies. After pre-op clearance the patient was taken to the operating room on 04/08/2015 and underwent  Procedure(s): LEFT TOTAL KNEE ARTHROPLASTY.    Patient was given perioperative antibiotics: Anti-infectives    Start     Dose/Rate Route Frequency Ordered Stop   04/09/15 1000   acyclovir (ZOVIRAX) tablet 400 mg     400 mg Oral  Every morning - 10a 04/08/15 1302     04/08/15 1700  ceFAZolin (ANCEF) IVPB 2 g/50 mL premix     2 g 100 mL/hr over 30 Minutes Intravenous Every 8 hours 04/08/15 1302 04/09/15 0138   04/08/15 0830  ceFAZolin (ANCEF) IVPB 2 g/50 mL premix     2 g 100 mL/hr over 30 Minutes Intravenous To [facility]Stay [facility] 04/07/15 1111 04/08/15 0920       Patient was given sequential compression devices, early ambulation, and chemoprophylaxis to prevent DVT.  Patient benefited maximally from hospital stay and there were no complications.    Recent vital signs: Patient Vitals for the past 24 hrs:  BP Temp Temp src Pulse Resp SpO2  04/10/15 0522 (!) 132/53 mmHg 98.1 F (36.7 C) - 77 18 97 %  04/09/15 2113 (!) 148/51 mmHg 97.8 F (36.6 C) - 85 18 100 %  04/09/15 1201 (!) 159/57 mmHg 98 F (36.7 C) Oral 78 18 97 %     Recent laboratory studies:  Recent Labs  04/09/15 0436 04/10/15 0715  WBC 15.9* 13.3*  HGB 11.0* 10.0*  HCT 32.1* 28.9*  PLT 230 209  NA 137 138  K 3.8 4.0  CL 100* 98*  CO2 26 30  BUN 14 16  CREATININE 1.36* 1.13*  GLUCOSE 169* 146*  CALCIUM 8.7* 8.8*     Discharge  Medications:     Medication List    STOP taking these medications        amLODipine 10 MG tablet  Commonly known as:  NORVASC     aspirin 81 MG tablet  Replaced by:  aspirin 325 MG EC tablet     HYDROcodone-acetaminophen 5-325 MG tablet  Commonly known as:  NORCO/VICODIN      TAKE these medications        acyclovir 400 MG tablet  Commonly known as:  ZOVIRAX  Take 400 mg by mouth every morning.     albuterol 108 (90 Base) MCG/ACT inhaler  Commonly known as:  PROVENTIL HFA  Inhale 1 puff into the lungs every 6 (six) hours as needed for shortness of breath.     aspirin 325 MG EC tablet  1 tab a day for the next 30 days to prevent blood clots     betamethasone valerate 0.1 % cream  Commonly known as:  VALISONE  Apply 1 application topically  daily as needed (for lichen scheleros).     cetirizine 10 MG tablet  Commonly known as:  ZYRTEC  Take 10 mg by mouth daily.     chlorthalidone 25 MG tablet  Commonly known as:  HYGROTON  Take 0.5 tablets (12.5 mg total) by mouth daily.     citalopram 20 MG tablet  Commonly known as:  CELEXA  Take 20 mg by mouth every morning.     clopidogrel 75 MG tablet  Commonly known as:  PLAVIX  Take 75 mg by mouth every morning.     cyclobenzaprine 5 MG tablet  Commonly known as:  FLEXERIL  Take 5 mg by mouth 3 (three) times daily as needed for muscle spasms.     docusate sodium 100 MG capsule  Commonly known as:  COLACE  1 tab 2 times a day while on narcotics.  STOOL SOFTENER     isosorbide mononitrate 30 MG 24 hr tablet  Commonly known as:  IMDUR  Take 1 tablet (30 mg total) by mouth 2 (two) times daily.     levothyroxine 88 MCG tablet  Commonly known as:  SYNTHROID, LEVOTHROID  Take 88 mcg by mouth every morning.     omeprazole 20 MG capsule  Commonly known as:  PRILOSEC  Take 20 mg by mouth every morning.     oxyCODONE 5 MG immediate release tablet  Commonly known as:  Oxy IR/ROXICODONE  1-2 tablets every 4-6 hrs as needed for pain     polyethylene glycol packet  Commonly known as:  MIRALAX / GLYCOLAX  17grams in 16 oz of water twice a day until bowel movement.  LAXITIVE.  Restart if two days since last bowel movement     potassium chloride 10 MEQ tablet  Commonly known as:  K-DUR  Take 2 tablets (20 mEq total) by mouth daily.     vitamin B-12 1000 MCG tablet  Commonly known as:  CYANOCOBALAMIN  Take 1,000 mcg by mouth daily.        Diagnostic Studies: No results found.  Disposition: 01-Home or Self Care      Discharge Instructions    CPM    Complete by:  As directed   Continuous passive motion machine (CPM):      Use the CPM from 0 to 90 for 6 hours per day.       You may break it up into 2 or 3 sessions per day.      Use CPM for  2 weeks or until you are  told to stop.     Call MD / Call 911    Complete by:  As directed   If you experience chest pain or shortness of breath, CALL 911 and be transported to the hospital emergency room.  If you develope a fever above 101 F, pus (white drainage) or increased drainage or redness at the wound, or calf pain, call your surgeon's office.     Change dressing    Complete by:  As directed   Change the gauze dressing daily with sterile 4 x 4 inch gauze and apply TED hose.  DO NOT REMOVE BANDAGE OVER SURGICAL INCISION.  [place] WHOLE LEG INCLUDING OVER THE WATERPROOF BANDAGE WITH SOAP AND WATER EVERY DAY.     Constipation Prevention    Complete by:  As directed   Drink plenty of fluids.  Prune juice may be helpful.  You may use a stool softener, such as Colace (over the counter) 100 mg twice a day.  Use MiraLax (over the counter) for constipation as needed.     Diet - low sodium heart healthy    Complete by:  As directed      Discharge instructions    Complete by:  As directed   INSTRUCTIONS AFTER JOINT REPLACEMENT   Remove items at home which could result in a fall. This includes throw rugs or furniture in walking pathways ICE to the affected joint every three hours while awake for 30 minutes at a time, for at least the first 3-5 days, and then as needed for pain and swelling.  Continue to use ice for pain and swelling. You may notice swelling that will progress down to the foot and ankle.  This is normal after surgery.  Elevate your leg when you are not up walking on it.   Continue to use the breathing machine you got in the hospital (incentive spirometer) which will help keep your temperature down.  It is common for your temperature to cycle up and down following surgery, especially at night when you are not up moving around and exerting yourself.  The breathing machine keeps your lungs expanded and your temperature down.   DIET:  As you were doing prior to hospitalization, we recommend a well-balanced  diet.  DRESSING / WOUND CARE / SHOWERING  Keep the surgical dressing until follow up.  The dressing is water proof, so you can shower without any extra covering.  IF THE DRESSING FALLS OFF or the wound gets wet inside, change the dressing with sterile gauze.  Please use good hand washing techniques before changing the dressing.  Do not use any lotions or creams on the incision until instructed by your surgeon.    ACTIVITY  Increase activity slowly as tolerated, but follow the weight bearing instructions below.   No driving for 6 weeks or until further direction given by your physician.  You cannot drive while taking narcotics.  No lifting or carrying greater than 10 lbs. until further directed by your surgeon. Avoid periods of inactivity such as sitting longer than an hour when not asleep. This helps prevent blood clots.  You may return to work once you are authorized by your doctor.     WEIGHT BEARING   Weight bearing as tolerated with assist device (walker, cane, etc) as directed, use it as long as suggested by your surgeon or therapist, typically at least 1-2 weeks.   EXERCISES  Results after joint replacement surgery are often  greatly improved when you follow the exercise, range of motion and muscle strengthening exercises prescribed by your doctor. Safety measures are also important to protect the joint from further injury. Any time any of these exercises cause you to have increased pain or swelling, decrease what you are doing until you are comfortable again and then slowly increase them. If you have problems or questions, call your caregiver or physical therapist for advice.   Rehabilitation is important following a joint replacement. After just a few days of immobilization, the muscles of the leg can become weakened and shrink (atrophy).  These exercises are designed to build up the tone and strength of the thigh and leg muscles and to improve motion. Often times heat used for twenty  to thirty minutes before working out will loosen up your tissues and help with improving the range of motion but do not use heat for the first two weeks following surgery (sometimes heat can increase post-operative swelling).   These exercises can be done on a training (exercise) mat, on the floor, on a table or on a bed. Use whatever works the best and is most comfortable for you.    Use music or television while you are exercising so that the exercises are a pleasant break in your day. This will make your life better with the exercises acting as a break in your routine that you can look forward to.   Perform all exercises about fifteen times, three times per day or as directed.  You should exercise both the operative leg and the other leg as well.   Exercises include:   Quad Sets - Tighten up the muscle on the front of the thigh (Quad) and hold for 5-10 seconds.   Straight Leg Raises - With your knee straight (if you were given a brace, keep it on), lift the leg to 60 degrees, hold for 3 seconds, and slowly lower the leg.  Perform this exercise against resistance later as your leg gets stronger.  Leg Slides: Lying on your back, slowly slide your foot toward your buttocks, bending your knee up off the floor (only go as far as is comfortable). Then slowly slide your foot back down until your leg is flat on the floor again.  Angel Wings: Lying on your back spread your legs to the side as far apart as you can without causing discomfort.  Hamstring Strength:  Lying on your back, push your heel against the floor with your leg straight by tightening up the muscles of your buttocks.  Repeat, but this time bend your knee to a comfortable angle, and push your heel against the floor.  You may put a pillow under the heel to make it more comfortable if necessary.   A rehabilitation program following joint replacement surgery can speed recovery and prevent re-injury in the future due to weakened muscles. Contact  your doctor or a physical therapist for more information on knee rehabilitation.    CONSTIPATION  Constipation is defined medically as fewer than three stools per week and severe constipation as less than one stool per week.  Even if you have a regular bowel pattern at home, your normal regimen is likely to be disrupted due to multiple reasons following surgery.  Combination of anesthesia, postoperative narcotics, change in appetite and fluid intake all can affect your bowels.   YOU MUST use at least one of the following options; they are listed in order of increasing strength to get the job done.  They are all available over the counter, and you may need to use some, POSSIBLY even all of these options:    Drink plenty of fluids (prune juice may be helpful) and high fiber foods Colace 100 mg by mouth twice a day  Senokot for constipation as directed and as needed Dulcolax (bisacodyl), take with full glass of water  Miralax (polyethylene glycol) once or twice a day as needed.  If you have tried all these things and are unable to have a bowel movement in the first 3-4 days after surgery call either your surgeon or your primary doctor.    If you experience loose stools or diarrhea, hold the medications until you stool forms back up.  If your symptoms do not get better within 1 week or if they get worse, check with your doctor.  If you experience "the worst abdominal pain ever" or develop nausea or vomiting, please contact the office immediately for further recommendations for treatment.   ITCHING:  If you experience itching with your medications, try taking only a single pain pill, or even half a pain pill at a time.  You can also use Benadryl over the counter for itching or also to help with sleep.   TED HOSE STOCKINGS:  Use stockings on both legs until for at least 2 weeks or as directed by physician office. They may be removed at night for sleeping.  MEDICATIONS:  See your medication summary  on the "After Visit Summary" that nursing will review with you.  You may have some home medications which will be placed on hold until you complete the course of blood thinner medication.  It is important for you to complete the blood thinner medication as prescribed.  PRECAUTIONS:  If you experience chest pain or shortness of breath - call 911 immediately for transfer to the hospital emergency department.   If you develop a fever greater that 101 F, purulent drainage from wound, increased redness or drainage from wound, foul odor from the wound/dressing, or calf pain - CONTACT YOUR SURGEON.                                                   FOLLOW-UP APPOINTMENTS:  If you do not already have a post-op appointment, please call the office for an appointment to be seen by your surgeon.  Guidelines for how soon to be seen are listed in your "After Visit Summary", but are typically between 1-4 weeks after surgery.  OTHER INSTRUCTIONS:   Knee Replacement:  Do not place pillow under knee, focus on keeping the knee straight while resting. CPM instructions: 0-90 degrees, 2 hours in the morning, 2 hours in the afternoon, and 2 hours in the evening. Place foam block, curve side up under heel at all times except when in CPM or when walking.  DO NOT modify, tear, cut, or change the foam block in any way.  MAKE SURE YOU:  Understand these instructions.  Get help right away if you are not doing well or get worse.    Thank you for letting us be a part of your medical care team.  It is a privilege we respect greatly.  We hope these instructions will help you stay on track for a fast and full recovery!     Do not put a pillow under the knee. Place  it under the heel.    Complete by:  As directed   Place gray foam block, curve side up under heel at all times except when in CPM or when walking.  DO NOT modify, tear, cut, or change in any way the gray foam block.     Increase activity slowly as tolerated    Complete  by:  As directed      TED hose    Complete by:  As directed   Use stockings (TED hose) for 2 weeks on both leg(s).  You may remove them at night for sleeping.           Follow-up Information    Follow up with Lorn Junes, MD On 04/23/2015.   Specialty:  Orthopedic Surgery   Why:  appt time 3pm   Contact information:   [address] [place] [address] [phone]        Signed: Linda Hedges 04/10/2015, 8:54 AM

## 2015-04-10 NOTE — Progress Notes (Signed)
Pt ready for discharge. IV removed and belongings gathered. Discharge instructions/education reviewed with pt and daughter and all questions/concerns addressed. Pt will be transported out via wheelchair to daughter's car. Will continue to monitor.

## 2015-04-10 NOTE — Progress Notes (Signed)
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 Pt completed all functional transfers with supervision. Practiced compensatory strategies for LB ADLs and pt required min-min guard assist to successfully complete task and will have daughter to assist with these tasks at home. Reviewed fall prevention and pain management strategies. All education has been completed and pt has no further questions. OT signing off.   Follow Up Recommendations  No OT follow up;Supervision - Intermittent    Equipment Recommendations  None recommended by OT    Recommendations for Other Services      Precautions / Restrictions Precautions Precautions: Knee Restrictions Weight Bearing Restrictions: Yes LLE Weight Bearing: Weight bearing as tolerated       Mobility Bed Mobility Overal bed mobility: Needs Assistance Bed Mobility: Supine to Sit;Sit to Supine     Supine to sit: Supervision Sit to supine: Supervision   General bed mobility comments: HOB flat, no use of bedrails to simulate home environment. Educated pt on hooking R foot under L ankle to help progress legs on/off bed if needed  Transfers Overall transfer level: Needs assistance Equipment used: Rolling walker (2 wheeled) Transfers: Sit to/from Stand Sit to Stand: Supervision         General transfer comment:  Supervision for safety. Good demonstration of safe hand placement on seated surfaces and proper use of RW during ADLs.    Balance Overall balance assessment: Needs assistance Sitting-balance support: No upper extremity supported;Feet supported Sitting balance-Leahy Scale: Good     Standing balance support: Bilateral upper extremity supported;During functional activity Standing balance-Leahy Scale: Fair                     ADL Overall ADL's : Needs assistance/impaired     Grooming: Wash/dry hands;Supervision/safety;Standing           Upper Body Dressing : Set up;Sitting   Lower Body Dressing: Minimal assistance;Cueing for compensatory techniques;Sit to/from stand Lower Body Dressing Details (indicate cue type and reason): Min assist to don socks, min guard assist to don underwear Toilet Transfer: Supervision/safety;Ambulation;BSC;RW Toilet Transfer Details (indicate cue type and reason): BSC over toilet Toileting- Clothing Manipulation and Hygiene: Supervision/safety;Sit to/from stand   Tub/ Shower Transfer: Walk-in shower;Supervision/safety;Cueing for sequencing;Ambulation;Rolling walker Tub/Shower Transfer Details (indicate cue type and reason): Cues for step sequence Functional mobility during ADLs: Supervision/safety;Rolling walker General ADL Comments: Reviewed LB dressing techniques and safe step sequence for shower transfer. Educated pt on fall prevention and pain management strategies.      Vision                     Perception     Praxis      Cognition   Behavior During Therapy: [facility] for tasks assessed/performed Overall Cognitive Status: Within Functional Limits for tasks assessed                       Extremity/Trunk Assessment  Exercises     Shoulder Instructions       General Comments      Pertinent Vitals/ Pain       Pain Assessment: 0-10 Pain Score: 5  Pain Location: L knee Pain Descriptors /  Indicators: Aching Pain Intervention(s): Limited activity within patient's tolerance;Monitored during session;Repositioned;Ice applied  Home Living                                          Prior Functioning/Environment              Frequency       Progress Toward Goals  OT Goals(current goals can now be found in the care plan section)  Progress towards OT goals: Goals met/education completed, patient discharged from OT  Acute Rehab OT Goals Patient Stated Goal: to go home today OT Goal Formulation: With patient Potential to Achieve Goals: Good ADL Goals Pt Will Perform Lower Body Bathing: with supervision;sit to/from stand Pt Will Perform Lower Body Dressing: with supervision;sit to/from stand Pt Will Transfer to Toilet: with supervision;ambulating;bedside commode Pt Will Perform Toileting - Clothing Manipulation and hygiene: with supervision;sit to/from stand Pt Will Perform Tub/Shower Transfer: Shower transfer;with supervision;ambulating;3 in 1;rolling walker  Plan All goals met and education completed, patient discharged from OT services    Co-evaluation                 End of Session Equipment Utilized During Treatment: Gait belt;Rolling walker CPM Left Knee CPM Left Knee: Off Additional Comments: trapeze bar   Activity Tolerance Patient tolerated treatment well   Patient Left in bed;with call bell/phone within reach;Other (comment) (PT present and zero degree bone foam applied)   Nurse Communication Mobility status        Time: 9735-3299 OT Time Calculation (min): 23 min  Charges: OT General Charges $OT Visit: 1 Procedure OT Treatments $Self Care/Home Management : 23-37 mins  Redmond Baseman, OTR/L Pager: [phone] 04/10/2015, 11:56 AM

## 2015-04-12 DIAGNOSIS — N189 Chronic kidney disease, unspecified: Secondary | ICD-10-CM | POA: Diagnosis not present

## 2015-04-12 DIAGNOSIS — I129 Hypertensive chronic kidney disease with stage 1 through stage 4 chronic kidney disease, or unspecified chronic kidney disease: Secondary | ICD-10-CM | POA: Diagnosis not present

## 2015-04-12 DIAGNOSIS — J449 Chronic obstructive pulmonary disease, unspecified: Secondary | ICD-10-CM | POA: Diagnosis not present

## 2015-04-12 DIAGNOSIS — Z471 Aftercare following joint replacement surgery: Secondary | ICD-10-CM | POA: Diagnosis not present

## 2015-04-12 DIAGNOSIS — Z96652 Presence of left artificial knee joint: Secondary | ICD-10-CM | POA: Diagnosis not present

## 2015-04-12 DIAGNOSIS — I2510 Atherosclerotic heart disease of native coronary artery without angina pectoris: Secondary | ICD-10-CM | POA: Diagnosis not present

## 2015-04-13 DIAGNOSIS — J449 Chronic obstructive pulmonary disease, unspecified: Secondary | ICD-10-CM | POA: Diagnosis not present

## 2015-04-13 DIAGNOSIS — Z96652 Presence of left artificial knee joint: Secondary | ICD-10-CM | POA: Diagnosis not present

## 2015-04-13 DIAGNOSIS — I2510 Atherosclerotic heart disease of native coronary artery without angina pectoris: Secondary | ICD-10-CM | POA: Diagnosis not present

## 2015-04-13 DIAGNOSIS — N189 Chronic kidney disease, unspecified: Secondary | ICD-10-CM | POA: Diagnosis not present

## 2015-04-13 DIAGNOSIS — I129 Hypertensive chronic kidney disease with stage 1 through stage 4 chronic kidney disease, or unspecified chronic kidney disease: Secondary | ICD-10-CM | POA: Diagnosis not present

## 2015-04-13 DIAGNOSIS — Z471 Aftercare following joint replacement surgery: Secondary | ICD-10-CM | POA: Diagnosis not present

## 2015-04-16 DIAGNOSIS — I2510 Atherosclerotic heart disease of native coronary artery without angina pectoris: Secondary | ICD-10-CM | POA: Diagnosis not present

## 2015-04-16 DIAGNOSIS — I129 Hypertensive chronic kidney disease with stage 1 through stage 4 chronic kidney disease, or unspecified chronic kidney disease: Secondary | ICD-10-CM | POA: Diagnosis not present

## 2015-04-16 DIAGNOSIS — Z471 Aftercare following joint replacement surgery: Secondary | ICD-10-CM | POA: Diagnosis not present

## 2015-04-16 DIAGNOSIS — N189 Chronic kidney disease, unspecified: Secondary | ICD-10-CM | POA: Diagnosis not present

## 2015-04-16 DIAGNOSIS — J449 Chronic obstructive pulmonary disease, unspecified: Secondary | ICD-10-CM | POA: Diagnosis not present

## 2015-04-16 DIAGNOSIS — Z96652 Presence of left artificial knee joint: Secondary | ICD-10-CM | POA: Diagnosis not present

## 2015-04-18 DIAGNOSIS — J449 Chronic obstructive pulmonary disease, unspecified: Secondary | ICD-10-CM | POA: Diagnosis not present

## 2015-04-18 DIAGNOSIS — N189 Chronic kidney disease, unspecified: Secondary | ICD-10-CM | POA: Diagnosis not present

## 2015-04-18 DIAGNOSIS — I2510 Atherosclerotic heart disease of native coronary artery without angina pectoris: Secondary | ICD-10-CM | POA: Diagnosis not present

## 2015-04-18 DIAGNOSIS — I129 Hypertensive chronic kidney disease with stage 1 through stage 4 chronic kidney disease, or unspecified chronic kidney disease: Secondary | ICD-10-CM | POA: Diagnosis not present

## 2015-04-18 DIAGNOSIS — Z471 Aftercare following joint replacement surgery: Secondary | ICD-10-CM | POA: Diagnosis not present

## 2015-04-18 DIAGNOSIS — Z96652 Presence of left artificial knee joint: Secondary | ICD-10-CM | POA: Diagnosis not present

## 2015-04-19 DIAGNOSIS — I2510 Atherosclerotic heart disease of native coronary artery without angina pectoris: Secondary | ICD-10-CM | POA: Diagnosis not present

## 2015-04-19 DIAGNOSIS — J449 Chronic obstructive pulmonary disease, unspecified: Secondary | ICD-10-CM | POA: Diagnosis not present

## 2015-04-19 DIAGNOSIS — Z471 Aftercare following joint replacement surgery: Secondary | ICD-10-CM | POA: Diagnosis not present

## 2015-04-19 DIAGNOSIS — N189 Chronic kidney disease, unspecified: Secondary | ICD-10-CM | POA: Diagnosis not present

## 2015-04-19 DIAGNOSIS — I129 Hypertensive chronic kidney disease with stage 1 through stage 4 chronic kidney disease, or unspecified chronic kidney disease: Secondary | ICD-10-CM | POA: Diagnosis not present

## 2015-04-19 DIAGNOSIS — Z96652 Presence of left artificial knee joint: Secondary | ICD-10-CM | POA: Diagnosis not present

## 2015-04-20 DIAGNOSIS — J449 Chronic obstructive pulmonary disease, unspecified: Secondary | ICD-10-CM | POA: Diagnosis not present

## 2015-04-20 DIAGNOSIS — N189 Chronic kidney disease, unspecified: Secondary | ICD-10-CM | POA: Diagnosis not present

## 2015-04-20 DIAGNOSIS — I129 Hypertensive chronic kidney disease with stage 1 through stage 4 chronic kidney disease, or unspecified chronic kidney disease: Secondary | ICD-10-CM | POA: Diagnosis not present

## 2015-04-20 DIAGNOSIS — I2510 Atherosclerotic heart disease of native coronary artery without angina pectoris: Secondary | ICD-10-CM | POA: Diagnosis not present

## 2015-04-20 DIAGNOSIS — Z96652 Presence of left artificial knee joint: Secondary | ICD-10-CM | POA: Diagnosis not present

## 2015-04-20 DIAGNOSIS — Z471 Aftercare following joint replacement surgery: Secondary | ICD-10-CM | POA: Diagnosis not present

## 2015-04-22 DIAGNOSIS — I129 Hypertensive chronic kidney disease with stage 1 through stage 4 chronic kidney disease, or unspecified chronic kidney disease: Secondary | ICD-10-CM | POA: Diagnosis not present

## 2015-04-22 DIAGNOSIS — N189 Chronic kidney disease, unspecified: Secondary | ICD-10-CM | POA: Diagnosis not present

## 2015-04-22 DIAGNOSIS — Z96652 Presence of left artificial knee joint: Secondary | ICD-10-CM | POA: Diagnosis not present

## 2015-04-22 DIAGNOSIS — J449 Chronic obstructive pulmonary disease, unspecified: Secondary | ICD-10-CM | POA: Diagnosis not present

## 2015-04-22 DIAGNOSIS — Z471 Aftercare following joint replacement surgery: Secondary | ICD-10-CM | POA: Diagnosis not present

## 2015-04-22 DIAGNOSIS — I2510 Atherosclerotic heart disease of native coronary artery without angina pectoris: Secondary | ICD-10-CM | POA: Diagnosis not present

## 2015-04-23 DIAGNOSIS — Z96652 Presence of left artificial knee joint: Secondary | ICD-10-CM | POA: Diagnosis not present

## 2015-04-29 DIAGNOSIS — E876 Hypokalemia: Secondary | ICD-10-CM | POA: Diagnosis not present

## 2015-04-29 DIAGNOSIS — I10 Essential (primary) hypertension: Secondary | ICD-10-CM | POA: Diagnosis not present

## 2015-04-29 DIAGNOSIS — M10079 Idiopathic gout, unspecified ankle and foot: Secondary | ICD-10-CM | POA: Diagnosis not present

## 2015-04-29 DIAGNOSIS — D519 Vitamin B12 deficiency anemia, unspecified: Secondary | ICD-10-CM | POA: Diagnosis not present

## 2015-05-01 DIAGNOSIS — R2689 Other abnormalities of gait and mobility: Secondary | ICD-10-CM | POA: Diagnosis not present

## 2015-05-01 DIAGNOSIS — M25662 Stiffness of left knee, not elsewhere classified: Secondary | ICD-10-CM | POA: Diagnosis not present

## 2015-05-01 DIAGNOSIS — M6281 Muscle weakness (generalized): Secondary | ICD-10-CM | POA: Diagnosis not present

## 2015-05-03 DIAGNOSIS — R2689 Other abnormalities of gait and mobility: Secondary | ICD-10-CM | POA: Diagnosis not present

## 2015-05-03 DIAGNOSIS — M25662 Stiffness of left knee, not elsewhere classified: Secondary | ICD-10-CM | POA: Diagnosis not present

## 2015-05-03 DIAGNOSIS — M6281 Muscle weakness (generalized): Secondary | ICD-10-CM | POA: Diagnosis not present

## 2015-05-06 DIAGNOSIS — R2689 Other abnormalities of gait and mobility: Secondary | ICD-10-CM | POA: Diagnosis not present

## 2015-05-06 DIAGNOSIS — M6281 Muscle weakness (generalized): Secondary | ICD-10-CM | POA: Diagnosis not present

## 2015-05-06 DIAGNOSIS — M25662 Stiffness of left knee, not elsewhere classified: Secondary | ICD-10-CM | POA: Diagnosis not present

## 2015-05-09 DIAGNOSIS — R2689 Other abnormalities of gait and mobility: Secondary | ICD-10-CM | POA: Diagnosis not present

## 2015-05-09 DIAGNOSIS — M6281 Muscle weakness (generalized): Secondary | ICD-10-CM | POA: Diagnosis not present

## 2015-05-09 DIAGNOSIS — M25662 Stiffness of left knee, not elsewhere classified: Secondary | ICD-10-CM | POA: Diagnosis not present

## 2015-05-13 DIAGNOSIS — M6281 Muscle weakness (generalized): Secondary | ICD-10-CM | POA: Diagnosis not present

## 2015-05-13 DIAGNOSIS — R2689 Other abnormalities of gait and mobility: Secondary | ICD-10-CM | POA: Diagnosis not present

## 2015-05-13 DIAGNOSIS — M25662 Stiffness of left knee, not elsewhere classified: Secondary | ICD-10-CM | POA: Diagnosis not present

## 2015-05-15 DIAGNOSIS — M6281 Muscle weakness (generalized): Secondary | ICD-10-CM | POA: Diagnosis not present

## 2015-05-15 DIAGNOSIS — R2689 Other abnormalities of gait and mobility: Secondary | ICD-10-CM | POA: Diagnosis not present

## 2015-05-15 DIAGNOSIS — M25662 Stiffness of left knee, not elsewhere classified: Secondary | ICD-10-CM | POA: Diagnosis not present

## 2015-05-16 DIAGNOSIS — R2689 Other abnormalities of gait and mobility: Secondary | ICD-10-CM | POA: Diagnosis not present

## 2015-05-16 DIAGNOSIS — M25662 Stiffness of left knee, not elsewhere classified: Secondary | ICD-10-CM | POA: Diagnosis not present

## 2015-05-16 DIAGNOSIS — M6281 Muscle weakness (generalized): Secondary | ICD-10-CM | POA: Diagnosis not present

## 2015-05-21 DIAGNOSIS — M25662 Stiffness of left knee, not elsewhere classified: Secondary | ICD-10-CM | POA: Diagnosis not present

## 2015-05-21 DIAGNOSIS — R2689 Other abnormalities of gait and mobility: Secondary | ICD-10-CM | POA: Diagnosis not present

## 2015-05-21 DIAGNOSIS — M1712 Unilateral primary osteoarthritis, left knee: Secondary | ICD-10-CM | POA: Diagnosis not present

## 2015-05-21 DIAGNOSIS — M6281 Muscle weakness (generalized): Secondary | ICD-10-CM | POA: Diagnosis not present

## 2015-05-23 DIAGNOSIS — R2689 Other abnormalities of gait and mobility: Secondary | ICD-10-CM | POA: Diagnosis not present

## 2015-05-23 DIAGNOSIS — M6281 Muscle weakness (generalized): Secondary | ICD-10-CM | POA: Diagnosis not present

## 2015-05-23 DIAGNOSIS — M25662 Stiffness of left knee, not elsewhere classified: Secondary | ICD-10-CM | POA: Diagnosis not present

## 2015-05-24 ENCOUNTER — Telehealth: Payer: Self-pay | Admitting: *Deleted

## 2015-05-24 NOTE — Telephone Encounter (Signed)
Message left on voice mail - question on discrepancy in her medications that she just got from the pharmacy.    Attempted to return call - left message.

## 2015-05-25 ENCOUNTER — Emergency Department (HOSPITAL_COMMUNITY): Payer: Medicare Other

## 2015-05-25 ENCOUNTER — Encounter (HOSPITAL_COMMUNITY): Payer: Self-pay

## 2015-05-25 ENCOUNTER — Inpatient Hospital Stay (HOSPITAL_COMMUNITY)
Admission: EM | Admit: 2015-05-25 | Discharge: 2015-05-26 | DRG: 641 | Disposition: A | Discharge status: Home / Self Care | Payer: Medicare Other | Attending: Internal Medicine | Admitting: Internal Medicine

## 2015-05-25 DIAGNOSIS — J449 Chronic obstructive pulmonary disease, unspecified: Secondary | ICD-10-CM | POA: Diagnosis present

## 2015-05-25 DIAGNOSIS — I2510 Atherosclerotic heart disease of native coronary artery without angina pectoris: Secondary | ICD-10-CM | POA: Diagnosis present

## 2015-05-25 DIAGNOSIS — Z8711 Personal history of peptic ulcer disease: Secondary | ICD-10-CM | POA: Diagnosis not present

## 2015-05-25 DIAGNOSIS — Z90710 Acquired absence of both cervix and uterus: Secondary | ICD-10-CM

## 2015-05-25 DIAGNOSIS — D72820 Lymphocytosis (symptomatic): Secondary | ICD-10-CM | POA: Diagnosis present

## 2015-05-25 DIAGNOSIS — Z96652 Presence of left artificial knee joint: Secondary | ICD-10-CM | POA: Diagnosis present

## 2015-05-25 DIAGNOSIS — Z79899 Other long term (current) drug therapy: Secondary | ICD-10-CM | POA: Diagnosis not present

## 2015-05-25 DIAGNOSIS — E785 Hyperlipidemia, unspecified: Secondary | ICD-10-CM | POA: Diagnosis present

## 2015-05-25 DIAGNOSIS — E872 Acidosis, unspecified: Secondary | ICD-10-CM | POA: Diagnosis present

## 2015-05-25 DIAGNOSIS — E876 Hypokalemia: Principal | ICD-10-CM | POA: Diagnosis present

## 2015-05-25 DIAGNOSIS — Z955 Presence of coronary angioplasty implant and graft: Secondary | ICD-10-CM | POA: Diagnosis not present

## 2015-05-25 DIAGNOSIS — Z7902 Long term (current) use of antithrombotics/antiplatelets: Secondary | ICD-10-CM | POA: Diagnosis not present

## 2015-05-25 DIAGNOSIS — G4730 Sleep apnea, unspecified: Secondary | ICD-10-CM | POA: Diagnosis present

## 2015-05-25 DIAGNOSIS — I10 Essential (primary) hypertension: Secondary | ICD-10-CM | POA: Diagnosis present

## 2015-05-25 DIAGNOSIS — Z8673 Personal history of transient ischemic attack (TIA), and cerebral infarction without residual deficits: Secondary | ICD-10-CM

## 2015-05-25 DIAGNOSIS — M1712 Unilateral primary osteoarthritis, left knee: Secondary | ICD-10-CM | POA: Diagnosis present

## 2015-05-25 DIAGNOSIS — E039 Hypothyroidism, unspecified: Secondary | ICD-10-CM | POA: Diagnosis present

## 2015-05-25 DIAGNOSIS — N183 Chronic kidney disease, stage 3 (moderate): Secondary | ICD-10-CM | POA: Diagnosis present

## 2015-05-25 DIAGNOSIS — I129 Hypertensive chronic kidney disease with stage 1 through stage 4 chronic kidney disease, or unspecified chronic kidney disease: Secondary | ICD-10-CM | POA: Diagnosis present

## 2015-05-25 DIAGNOSIS — R531 Weakness: Secondary | ICD-10-CM | POA: Diagnosis not present

## 2015-05-25 DIAGNOSIS — E860 Dehydration: Secondary | ICD-10-CM | POA: Diagnosis present

## 2015-05-25 DIAGNOSIS — N179 Acute kidney failure, unspecified: Secondary | ICD-10-CM | POA: Diagnosis present

## 2015-05-25 DIAGNOSIS — Z7982 Long term (current) use of aspirin: Secondary | ICD-10-CM

## 2015-05-25 DIAGNOSIS — R0602 Shortness of breath: Secondary | ICD-10-CM | POA: Diagnosis not present

## 2015-05-25 DIAGNOSIS — Z8249 Family history of ischemic heart disease and other diseases of the circulatory system: Secondary | ICD-10-CM

## 2015-05-25 DIAGNOSIS — R202 Paresthesia of skin: Secondary | ICD-10-CM | POA: Diagnosis present

## 2015-05-25 DIAGNOSIS — J439 Emphysema, unspecified: Secondary | ICD-10-CM | POA: Diagnosis not present

## 2015-05-25 DIAGNOSIS — N189 Chronic kidney disease, unspecified: Secondary | ICD-10-CM | POA: Diagnosis present

## 2015-05-25 LAB — URINALYSIS, ROUTINE W REFLEX MICROSCOPIC
BILIRUBIN URINE: NEGATIVE
GLUCOSE, UA: NEGATIVE mg/dL
HGB URINE DIPSTICK: NEGATIVE
Ketones, ur: NEGATIVE mg/dL
Leukocytes, UA: NEGATIVE
Nitrite: NEGATIVE
Protein, ur: NEGATIVE mg/dL
pH: 6 (ref 5.0–8.0)

## 2015-05-25 LAB — BRAIN NATRIURETIC PEPTIDE: B NATRIURETIC PEPTIDE 5: 35 pg/mL (ref 0.0–100.0)

## 2015-05-25 LAB — COMPREHENSIVE METABOLIC PANEL
ALK PHOS: 67 U/L (ref 38–126)
ALT: 18 U/L (ref 14–54)
AST: 31 U/L (ref 15–41)
Albumin: 4.4 g/dL (ref 3.5–5.0)
Anion gap: 22 — ABNORMAL HIGH (ref 5–15)
BILIRUBIN TOTAL: 0.7 mg/dL (ref 0.3–1.2)
BUN: 16 mg/dL (ref 6–20)
CALCIUM: 9.7 mg/dL (ref 8.9–10.3)
CO2: 20 mmol/L — ABNORMAL LOW (ref 22–32)
CREATININE: 1.75 mg/dL — AB (ref 0.44–1.00)
Chloride: 95 mmol/L — ABNORMAL LOW (ref 101–111)
GFR, EST AFRICAN AMERICAN: 32 mL/min — AB (ref >60)
GFR, EST NON AFRICAN AMERICAN: 28 mL/min — AB (ref >60)
Glucose, Bld: 137 mg/dL — ABNORMAL HIGH (ref 65–99)
Potassium: 2.4 mmol/L — CL (ref 3.5–5.1)
Sodium: 137 mmol/L (ref 135–145)
Total Protein: 8.2 g/dL — ABNORMAL HIGH (ref 6.5–8.1)

## 2015-05-25 LAB — CBC WITH DIFFERENTIAL/PLATELET
BASOS PCT: 0 %
Basophils Absolute: 0 10*3/uL (ref 0.0–0.1)
EOS ABS: 0.2 10*3/uL (ref 0.0–0.7)
Eosinophils Relative: 1 %
HEMATOCRIT: 40.1 % (ref 36.0–46.0)
Hemoglobin: 14.4 g/dL (ref 12.0–15.0)
Lymphocytes Relative: 52 %
Lymphs Abs: 7.1 10*3/uL — ABNORMAL HIGH (ref 0.7–4.0)
MCH: 31.9 pg (ref 26.0–34.0)
MCHC: 35.9 g/dL (ref 30.0–36.0)
MCV: 88.7 fL (ref 78.0–100.0)
MONO ABS: 1.3 10*3/uL — AB (ref 0.1–1.0)
MONOS PCT: 10 %
NEUTROS ABS: 5.1 10*3/uL (ref 1.7–7.7)
Neutrophils Relative %: 37 %
Platelets: 355 10*3/uL (ref 150–400)
RBC: 4.52 MIL/uL (ref 3.87–5.11)
RDW: 12.7 % (ref 11.5–15.5)
WBC: 13.8 10*3/uL — ABNORMAL HIGH (ref 4.0–10.5)

## 2015-05-25 LAB — I-STAT CG4 LACTIC ACID, ED
LACTIC ACID, VENOUS: 1.68 mmol/L (ref 0.5–2.0)
Lactic Acid, Venous: 8.27 mmol/L (ref 0.5–2.0)

## 2015-05-25 LAB — LIPASE, BLOOD: LIPASE: 23 U/L (ref 11–51)

## 2015-05-25 LAB — MAGNESIUM: MAGNESIUM: 1.6 mg/dL — AB (ref 1.7–2.4)

## 2015-05-25 LAB — TROPONIN I: Troponin I: <0.03 ng/mL (ref <0.031)

## 2015-05-25 LAB — TSH: TSH: 1.122 u[IU]/mL (ref 0.350–4.500)

## 2015-05-25 MED ORDER — POTASSIUM CHLORIDE IN NACL 20-0.9 MEQ/L-% IV SOLN
INTRAVENOUS | Status: AC
  Administered 2015-05-25: 23:00:00 via INTRAVENOUS

## 2015-05-25 MED ORDER — POTASSIUM CHLORIDE 10 MEQ/100ML IV SOLN
INTRAVENOUS | Status: AC
  Filled 2015-05-25: qty 100

## 2015-05-25 MED ORDER — CYCLOBENZAPRINE HCL 10 MG PO TABS: 5.0000 mg | ORAL_TABLET | Freq: Three times a day (TID) | ORAL | Status: DC | PRN

## 2015-05-25 MED ORDER — ONDANSETRON HCL 4 MG/2ML IJ SOLN: 4.0000 mg | Freq: Four times a day (QID) | INTRAMUSCULAR | Status: DC | PRN

## 2015-05-25 MED ORDER — PANTOPRAZOLE SODIUM 40 MG PO TBEC
40.0000 mg | DELAYED_RELEASE_TABLET | Freq: Every day | ORAL | Status: DC
  Administered 2015-05-26: 40 mg via ORAL
  Filled 2015-05-25: qty 1

## 2015-05-25 MED ORDER — ASPIRIN EC 81 MG PO TBEC
81.0000 mg | DELAYED_RELEASE_TABLET | Freq: Every day | ORAL | Status: DC
  Administered 2015-05-26: 81 mg via ORAL
  Filled 2015-05-25: qty 1

## 2015-05-25 MED ORDER — ACYCLOVIR 400 MG PO TABS
400.0000 mg | ORAL_TABLET | Freq: Every morning | ORAL | Status: DC
  Filled 2015-05-25 (×2): qty 1

## 2015-05-25 MED ORDER — POTASSIUM CHLORIDE 10 MEQ/100ML IV SOLN
10.0000 meq | INTRAVENOUS | Status: AC
  Administered 2015-05-25 (×5): 10 meq via INTRAVENOUS
  Filled 2015-05-25 (×7): qty 100

## 2015-05-25 MED ORDER — ACETAMINOPHEN 650 MG RE SUPP: 650.0000 mg | Freq: Four times a day (QID) | RECTAL | Status: DC | PRN

## 2015-05-25 MED ORDER — SODIUM CHLORIDE 0.9 % IV BOLUS (SEPSIS)
1000.0000 mL | Freq: Once | INTRAVENOUS | Status: AC
  Administered 2015-05-25: 1000 mL via INTRAVENOUS

## 2015-05-25 MED ORDER — BISACODYL 5 MG PO TBEC: 5.0000 mg | DELAYED_RELEASE_TABLET | Freq: Every day | ORAL | Status: DC | PRN

## 2015-05-25 MED ORDER — HYDROCODONE-ACETAMINOPHEN 5-325 MG PO TABS: 1.0000 | ORAL_TABLET | ORAL | Status: DC | PRN

## 2015-05-25 MED ORDER — SODIUM CHLORIDE 0.9% FLUSH
3.0000 mL | Freq: Two times a day (BID) | INTRAVENOUS | Status: DC
  Administered 2015-05-25: 3 mL via INTRAVENOUS

## 2015-05-25 MED ORDER — VITAMIN B-12 1000 MCG PO TABS
1000.0000 ug | ORAL_TABLET | Freq: Every day | ORAL | Status: DC
  Administered 2015-05-26: 1000 ug via ORAL
  Filled 2015-05-25: qty 1

## 2015-05-25 MED ORDER — AMLODIPINE BESYLATE 5 MG PO TABS
10.0000 mg | ORAL_TABLET | Freq: Every morning | ORAL | Status: DC
  Administered 2015-05-26: 10 mg via ORAL
  Filled 2015-05-25: qty 2

## 2015-05-25 MED ORDER — ONDANSETRON HCL 4 MG PO TABS: 4.0000 mg | ORAL_TABLET | Freq: Four times a day (QID) | ORAL | Status: DC | PRN

## 2015-05-25 MED ORDER — ISOSORBIDE MONONITRATE ER 30 MG PO TB24
30.0000 mg | ORAL_TABLET | Freq: Two times a day (BID) | ORAL | Status: DC
  Administered 2015-05-25 – 2015-05-26 (×2): 30 mg via ORAL
  Filled 2015-05-25 (×2): qty 1

## 2015-05-25 MED ORDER — IPRATROPIUM-ALBUTEROL 0.5-2.5 (3) MG/3ML IN SOLN: 3.0000 mL | RESPIRATORY_TRACT | Status: DC | PRN

## 2015-05-25 MED ORDER — MAGNESIUM SULFATE 2 GM/50ML IV SOLN
2.0000 g | Freq: Once | INTRAVENOUS | Status: AC
  Administered 2015-05-25: 2 g via INTRAVENOUS
  Filled 2015-05-25: qty 50

## 2015-05-25 MED ORDER — CLOPIDOGREL BISULFATE 75 MG PO TABS
75.0000 mg | ORAL_TABLET | Freq: Every morning | ORAL | Status: DC
  Administered 2015-05-26: 75 mg via ORAL
  Filled 2015-05-25: qty 1

## 2015-05-25 MED ORDER — ACETAMINOPHEN 325 MG PO TABS: 650.0000 mg | ORAL_TABLET | Freq: Four times a day (QID) | ORAL | Status: DC | PRN

## 2015-05-25 MED ORDER — HEPARIN SODIUM (PORCINE) 5000 UNIT/ML IJ SOLN
5000.0000 [IU] | Freq: Three times a day (TID) | INTRAMUSCULAR | Status: DC
  Administered 2015-05-25: 5000 [IU] via SUBCUTANEOUS
  Filled 2015-05-25 (×2): qty 1

## 2015-05-25 MED ORDER — POLYETHYLENE GLYCOL 3350 17 G PO PACK: 17.0000 g | PACK | Freq: Every day | ORAL | Status: DC | PRN

## 2015-05-25 MED ORDER — POTASSIUM CHLORIDE CRYS ER 20 MEQ PO TBCR
40.0000 meq | EXTENDED_RELEASE_TABLET | Freq: Once | ORAL | Status: AC
  Administered 2015-05-25: 40 meq via ORAL
  Filled 2015-05-25: qty 2

## 2015-05-25 MED ORDER — CITALOPRAM HYDROBROMIDE 20 MG PO TABS
20.0000 mg | ORAL_TABLET | Freq: Every morning | ORAL | Status: DC
  Administered 2015-05-26: 20 mg via ORAL
  Filled 2015-05-25: qty 1

## 2015-05-25 MED ORDER — LEVOTHYROXINE SODIUM 88 MCG PO TABS
88.0000 ug | ORAL_TABLET | Freq: Every morning | ORAL | Status: DC
  Administered 2015-05-26: 88 ug via ORAL
  Filled 2015-05-25: qty 1

## 2015-05-25 MED ORDER — LORATADINE 10 MG PO TABS
10.0000 mg | ORAL_TABLET | Freq: Every day | ORAL | Status: DC
  Administered 2015-05-26: 10 mg via ORAL
  Filled 2015-05-25: qty 1

## 2015-05-25 NOTE — ED Notes (Signed)
Pt states she was cooking and became real tired. States she then became SOB. When pt arrived to ED she was hyperventilating. Complaining of numbness all over. Pt's fingers drawing and cramping

## 2015-05-25 NOTE — H&P (Signed)
[facility] History and Physical  MAYLEI ZEBROWSKI [id number] DOB: [birth year] DOA: 05/25/2015  Referring physician: ED physician PCP: Rory Percy, MD  Specialists: Dr. Bronson Ing (cardiology)   Chief Complaint:  Fatigue, numbness/tingling  HPI: Annette Sharp is a 75 y.o. female with PMH of coronary artery disease with stent, hypertension, CKD stage III, hypothyroidism, and COPD who presents to the ED with fatigue and generalized paresthesias. Patient reports chronic exertional dyspnea and fatigue, but states that fatigue has worsened significantly over the past 1-2 weeks. Prior to that she suffered one week of severe watery diarrhea which she reports was associated with a roughly 20 pound weight loss. She had recovered from a GI perspective but remained quite fatigued. She woke in her usual state this morning, but while cooking at home this evening, she developed paresthesias that began periorally, spread to include the upper extremities, and eventually became generalized, prompting her to seek evaluation in the ED. She denies any recent change in medications. She denies fevers, chills, dysuria, or cough. As noted above, she reports 1 week of severe, watery diarrhea, but notes that this is resolved approximately 2-3 weeks ago. She endorses chronic dyspnea with exertion, but denies chest pain, palpitations, or productive cough. She has never experienced paresthesias previously.  In ED, patient was found to be afebrile, saturating well on room air and with vital signs stable. EKG featured sinus tachycardia with rate 109 and chest x-ray was negative for acute cardiopulmonary disease. Initial blood work is notable for serum potassium of 2.4, serum creatinine of 1.75, up from apparent baseline of 1.2. There is a leukocytosis to 13,800 on CBC within absolute lymphocytosis of 7100. Lactic acid was markedly elevated to 8.27 and urinalysis is notable for a specific gravity of <1.005. 2 L of normal saline  were bolused in the emergency department. 60 mEq of IV potassium and 40 mEq PO potassium were administered. Patient enjoyed complete resolution of her paresthesias while still in the ED and remained hemodynamically stable. She will be admitted for ongoing evaluation and management of critical hypokalemia with generalized paresthesias and fatigue.  Where does patient live?   At home     Can patient participate in ADLs?  Yes       Review of Systems:   General: no fevers, chills, sweats, or poor appetite. Recent wt loss d/t diarrhea; fatigue HEENT: no blurry vision, hearing changes or sore throat Pulm: no cough or wheeze. Chronic dyspnea CV: no chest pain or palpitations Abd: no nausea, vomiting, abdominal pain, or constipation. 1 wk of severe diarrhea 2-3 wks ago GU: no dysuria, hematuria, increased urinary frequency, or urgency  Ext: no leg edema Neuro: no focal weakness, no vision change or hearing loss. Paresthesias just PTA, now resolved Skin: no rash, no wounds MSK: No muscle spasm, no deformity, no red, hot, or swollen joint Heme: No easy bruising or bleeding Travel history: No recent long distant travel    Allergy:  Allergies  Allergen Reactions  . Ace Inhibitors Cough  . Gemfibrozil Other (See Comments)    Muscle Aches  . Statins     REACTION: Muscle Cramps  . Moxifloxacin Rash  . Ranexa [Ranolazine] Other (See Comments)    "NIGHTMARES"    Past Medical History  Diagnosis Date  . Overweight(278.02)     OBESITY  . Hyperlipidemia   . Other and unspecified angina pectoris     ANGINA  . Hypertension   . Back pain   . Peptic ulcer disease   .  Thyroid disease     TREATED HYPOTHYROIDISM  . 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 Stroke ([place])   . Depression   . Chronic renal insufficiency     GFR 45 ML'S PER MINUTE  . Coronary artery disease     S/P CYPHER DRUG-ELUTING STENTS TO RIGHT CORONARY ARTERY JUNE 2007  . Coronary  atherosclerosis of native coronary artery     NEGATIVE LEXISCAN FOR ISCHEMIA MARCH 23,2010 NORMAL LV FUNCTTION  . Primary localized osteoarthritis of left knee   . Sleep apnea     SEVERE OBSTRUCTIVE    cpap  . COPD (chronic obstructive pulmonary disease) ([place])   . Anxiety   . Hypothyroidism     Past Surgical History  Procedure Laterality Date  . Carpal tunnel release    . Cholecystectomy    . Total abdominal hysterectomy w/ bilateral salpingoophorectomy    . Replacement total knee Right 07/28/2010  . Rotator cuff repair Right 07/08/2009  . Cardiac catheterization N/A 01/22/2015    Procedure: Right/Left Heart Cath and Coronary Angiography;  Surgeon: Wellington Hampshire, MD;  Location: [facility];  Service: Cardiovascular;  Laterality: N/A;  . Abdominal hysterectomy    . Joint replacement    . Total knee arthroplasty Left 04/08/2015    Procedure: LEFT TOTAL KNEE ARTHROPLASTY;  Surgeon: Elsie Saas, MD;  Location: [facility];  Service: Orthopedics;  Laterality: Left;    Social History:  reports that she has never smoked. She has never used smokeless tobacco. She reports that she does not drink alcohol or use illicit drugs.  Family History:  Family History  Problem Relation Age of Onset  . CAD    . Hypertension    . Heart attack    . Cancer       Prior to Admission medications   Medication Sig Start Date End Date Taking? Authorizing Provider  acyclovir (ZOVIRAX) 400 MG tablet Take 400 mg by mouth every morning.    Yes Historical Provider, MD  albuterol (PROVENTIL HFA) 108 (90 BASE) MCG/ACT inhaler Inhale 1 puff into the lungs every 6 (six) hours as needed for shortness of breath. 01/26/13  Yes Herminio Commons, MD  amLODipine (NORVASC) 10 MG tablet Take 10 mg by mouth every morning. 05/04/15  Yes Historical Provider, MD  aspirin EC 81 MG tablet Take 81 mg by mouth daily.   Yes Historical Provider, MD  Bisacodyl (LAXATIVE PO) Take 1 tablet by mouth daily as needed (Constipation).    Yes Historical Provider, MD  cetirizine (ZYRTEC) 10 MG tablet Take 10 mg by mouth daily.   Yes Historical Provider, MD  chlorthalidone (HYGROTON) 25 MG tablet Take 0.5 tablets (12.5 mg total) by mouth daily. 01/16/15  Yes Herminio Commons, MD  citalopram (CELEXA) 20 MG tablet Take 20 mg by mouth every morning.    Yes Historical Provider, MD  clopidogrel (PLAVIX) 75 MG tablet Take 75 mg by mouth every morning.    Yes Historical Provider, MD  cyclobenzaprine (FLEXERIL) 5 MG tablet Take 5 mg by mouth 3 (three) times daily as needed for muscle spasms.   Yes Historical Provider, MD  isosorbide mononitrate (IMDUR) 30 MG 24 hr tablet Take 1 tablet (30 mg total) by mouth 2 (two) times daily. 03/26/14  Yes Herminio Commons, MD  levothyroxine (SYNTHROID, LEVOTHROID) 88 MCG tablet Take 88 mcg by mouth every morning.    Yes Historical Provider, MD  omeprazole (PRILOSEC) 20 MG  capsule Take 20 mg by mouth every morning.    Yes Historical Provider, MD  polyethylene glycol (MIRALAX / GLYCOLAX) packet 17grams in 16 oz of water twice a day until bowel movement.  LAXITIVE.  Restart if two days since last bowel movement Patient taking differently: Take 17 g by mouth daily as needed for mild constipation. 17grams in 16 oz of water twice a day until bowel movement.  LAXITIVE.  Restart if two days since last bowel movement 04/10/15  Yes Kirstin Shepperson, PA-C  potassium chloride (K-DUR) 10 MEQ tablet Take 2 tablets (20 mEq total) by mouth daily. 02/07/15  Yes Herminio Commons, MD  vitamin B-12 (CYANOCOBALAMIN) 1000 MCG tablet Take 1,000 mcg by mouth daily.   Yes Historical Provider, MD  aspirin EC 325 MG EC tablet 1 tab a day for the next 30 days to prevent blood clots 04/10/15   Kirstin Shepperson, PA-C  betamethasone valerate (VALISONE) 0.1 % cream Apply 1 application topically daily as needed (for lichen scheleros).     Historical Provider, MD  docusate sodium (COLACE) 100 MG capsule 1 tab 2 times a day while  on narcotics.  STOOL SOFTENER 04/10/15   Kirstin Shepperson, PA-C  oxyCODONE (OXY IR/ROXICODONE) 5 MG immediate release tablet 1-2 tablets every 4-6 hrs as needed for pain 04/10/15   Matthew Saras, PA-C    Physical Exam: Filed Vitals:   05/25/15 1830 05/25/15 1930 05/25/15 2000 05/25/15 2200  BP: 140/63 146/65 143/60 133/55  Pulse: 91 88 88 76  Temp:      TempSrc:      Resp: 19 29 25 27   Height:      Weight:      SpO2: 100% 99% 99% 100%   General: Not in acute distress HEENT:       Eyes: PERRL, EOMI, no scleral icterus or conjunctival pallor.       ENT: No discharge from the ears or nose, no pharyngeal ulcers, oral mucosa dry, tacky.        Neck: No JVD, no bruit, no appreciable mass Heme: No cervical adenopathy, no pallor Cardiac: S1/S2, RRR, No murmurs, No gallops or rubs. Pulm: Good air movement bilaterally. No rales, wheezing, rhonchi or rubs. Abd: Soft, nondistended, nontender, no rebound pain or gaurding, no mass or organomegaly, BS present. Ext: No LE edema bilaterally. 2+DP/PT pulse bilaterally. Musculoskeletal: No gross deformity, no red, hot, swollen joints   Skin: No rashes or wounds on exposed surfaces. Poor turgor  Neuro: Alert, oriented X3, cranial nerves II-XII grossly intact. No focal findings Psych: Patient is not overtly psychotic, appropriate mood and affect.  Labs on Admission:  Basic Metabolic Panel:  Recent Labs Lab 05/25/15 1640  NA 137  K 2.4*  CL 95*  CO2 20*  GLUCOSE 137*  BUN 16  CREATININE 1.75*  CALCIUM 9.7   Liver Function Tests:  Recent Labs Lab 05/25/15 1640  AST 31  ALT 18  ALKPHOS 67  BILITOT 0.7  PROT 8.2*  ALBUMIN 4.4    Recent Labs Lab 05/25/15 1640  LIPASE 23   No results for input(s): AMMONIA in the last 168 hours. CBC:  Recent Labs Lab 05/25/15 1640  WBC 13.8*  NEUTROABS 5.1  HGB 14.4  HCT 40.1  MCV 88.7  PLT 355   Cardiac Enzymes:  Recent Labs Lab 05/25/15 1640  TROPONINI <0.03    BNP  (last 3 results)  Recent Labs  05/25/15 1640  BNP 35.0    ProBNP (last 3 results) No  results for input(s): PROBNP in the last 8760 hours.  CBG: No results for input(s): GLUCAP in the last 168 hours.  Radiological Exams on Admission: Dg Chest 2 View  05/25/2015  CLINICAL DATA:  Shortness of Breath EXAM: CHEST  2 VIEW COMPARISON:  01/30/2015 FINDINGS: The heart size and mediastinal contours are within normal limits. Both lungs are clear. The visualized skeletal structures are unremarkable. IMPRESSION: No active cardiopulmonary disease. Electronically Signed   By: Inez Catalina M.D.   On: 05/25/2015 17:29    EKG: Independently reviewed.  Abnormal findings: Sinus tachycardia (rate 109), atrial premature complexes   Assessment/Plan  1. Critical hypokalemia  - Serum potassium 2.4 on admission  - Likely multifactorial with contributions from GI losses and chlorthalidone use  - Given the absolute lymphocytosis, new-onset leukemia considered, in which case hypokalemia is driven by increased lymphocyte production; this is considered much less likely  - Hold chlorthalidone, should likely be discontinued  - Supplemented with 60 meq IV potassium, 40 meq PO potassium in ED  - Check magnesium level and replete prn  - Check urine potassium to creatinine ratio - Repeat chem panel after replacement  - Monitor on telemetry    2. Dehydration  - Secondary to profuse watery diarrhea which has since resolved  - 3 L NS given in ED, will continue IVF with NS at 100 cc/hr overnight  - Follow I/Os, renal fxn  3. AKI on CKD stage III  - SCr 1.75 on arrival, up from apparent baseline of 1.2  - Suspect a prerenal azotemia in setting of profuse watery diarrhea  - Anticipate recovery with IVF resuscitation   - Repeat chem panel after fluid resuscitation  - Avoid nephrotoxins where possible   4. Hypertension  - At goal currently  - Holding chlorthalidone in light of hypokalemia, should likely be  discontinued  - Continue home-dose Norvasc 10 qD   5. Lymphocytosis  - Absolute lymphocyte count elevated to [id number] - Uncertain etiology; new leukemia considered in setting of hypokalemia  - Monitor   DVT ppx: SQ Heparin    Code Status: Full code Family Communication:  Yes, patient's grandchildren at bed side Disposition Plan: Admit to inpatient   Date of Service 05/25/2015    Vianne Bulls, MD [facility] Pager [phone]  If 7PM-7AM, please contact night-coverage [email or web address] Password [facility] 05/25/2015, 10:19 PM

## 2015-05-25 NOTE — ED Provider Notes (Addendum)
CSN: [id number]     Arrival date & time 05/25/15  1638 History   First MD Initiated Contact with Patient 05/25/15 1644     Chief Complaint  Patient presents with  . Fatigue     (Consider location/radiation/quality/duration/timing/severity/associated sxs/prior Treatment) HPI Patient presents with her granddaughter who assists with the history of present illness. Patient notes that she has baseline weakness, chronically, but today, while cooking, felt particularly more weak than usual. Weakness occurred with increased dyspnea, beyond baseline. She also developed paresthesia in all her distal extremities, but no chest pain, no headache, no lightheadedness, nor syncope. Symptoms persist in spite of resting.  No recent medication changes, diet changes, activity changes. Patient acknowledges a history of COPD, for which she takes albuterol. Patient also has history of CVA, takes Plavix.   Past Medical History  Diagnosis Date  . Overweight(278.02)     OBESITY  . Hyperlipidemia   . Other and unspecified angina pectoris     ANGINA  . Hypertension   . Back pain   . Peptic ulcer disease   . Thyroid disease     TREATED HYPOTHYROIDISM  . 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 Stroke ([place])   . Depression   . Chronic renal insufficiency     GFR 45 ML'S PER MINUTE  . Coronary artery disease     S/P CYPHER DRUG-ELUTING STENTS TO RIGHT CORONARY ARTERY JUNE 2007  . Coronary atherosclerosis of native coronary artery     NEGATIVE LEXISCAN FOR ISCHEMIA MARCH 23,2010 NORMAL LV FUNCTTION  . Primary localized osteoarthritis of left knee   . Sleep apnea     SEVERE OBSTRUCTIVE    cpap  . COPD (chronic obstructive pulmonary disease) ([place])   . Anxiety   . Hypothyroidism    Past Surgical History  Procedure Laterality Date  . Carpal tunnel release    . Cholecystectomy    . Total abdominal hysterectomy w/ bilateral salpingoophorectomy    .  Replacement total knee Right 07/28/2010  . Rotator cuff repair Right 07/08/2009  . Cardiac catheterization N/A 01/22/2015    Procedure: Right/Left Heart Cath and Coronary Angiography;  Surgeon: Wellington Hampshire, MD;  Location: [facility];  Service: Cardiovascular;  Laterality: N/A;  . Abdominal hysterectomy    . Joint replacement    . Total knee arthroplasty Left 04/08/2015    Procedure: LEFT TOTAL KNEE ARTHROPLASTY;  Surgeon: Elsie Saas, MD;  Location: [facility];  Service: Orthopedics;  Laterality: Left;   Family History  Problem Relation Age of Onset  . CAD    . Hypertension    . Heart attack    . Cancer     Social History  Substance Use Topics  . Smoking status: Never Smoker   . Smokeless tobacco: Never Used  . Alcohol Use: No   OB History    Gravida Para Term Preterm AB TAB SAB Ectopic Multiple Living            2     Review of Systems  Constitutional:       Per HPI, otherwise negative  HENT:       Per HPI, otherwise negative  Respiratory:       Per HPI, otherwise negative  Cardiovascular:       Per HPI, otherwise negative  Gastrointestinal: Negative for vomiting.  Endocrine:       Negative aside from HPI  Genitourinary:  Neg aside from HPI   Musculoskeletal:       Per HPI, otherwise negative  Skin: Negative.   Neurological: Positive for weakness. Negative for syncope.      Allergies  Ace inhibitors; Gemfibrozil; Statins; Moxifloxacin; and Ranexa  Home Medications   Prior to Admission medications   Medication Sig Start Date End Date Taking? Authorizing Provider  acyclovir (ZOVIRAX) 400 MG tablet Take 400 mg by mouth every morning.     Historical Provider, MD  albuterol (PROVENTIL HFA) 108 (90 BASE) MCG/ACT inhaler Inhale 1 puff into the lungs every 6 (six) hours as needed for shortness of breath. 01/26/13   Herminio Commons, MD  aspirin EC 325 MG EC tablet 1 tab a day for the next 30 days to prevent blood clots 04/10/15   Kirstin Shepperson,  PA-C  betamethasone valerate (VALISONE) 0.1 % cream Apply 1 application topically daily as needed (for lichen scheleros).     Historical Provider, MD  cetirizine (ZYRTEC) 10 MG tablet Take 10 mg by mouth daily.    Historical Provider, MD  chlorthalidone (HYGROTON) 25 MG tablet Take 0.5 tablets (12.5 mg total) by mouth daily. 01/16/15   Herminio Commons, MD  citalopram (CELEXA) 20 MG tablet Take 20 mg by mouth every morning.     Historical Provider, MD  clopidogrel (PLAVIX) 75 MG tablet Take 75 mg by mouth every morning.     Historical Provider, MD  cyclobenzaprine (FLEXERIL) 5 MG tablet Take 5 mg by mouth 3 (three) times daily as needed for muscle spasms.    Historical Provider, MD  docusate sodium (COLACE) 100 MG capsule 1 tab 2 times a day while on narcotics.  STOOL SOFTENER 04/10/15   Kirstin Shepperson, PA-C  isosorbide mononitrate (IMDUR) 30 MG 24 hr tablet Take 1 tablet (30 mg total) by mouth 2 (two) times daily. 03/26/14   Herminio Commons, MD  levothyroxine (SYNTHROID, LEVOTHROID) 88 MCG tablet Take 88 mcg by mouth every morning.     Historical Provider, MD  omeprazole (PRILOSEC) 20 MG capsule Take 20 mg by mouth every morning.     Historical Provider, MD  oxyCODONE (OXY IR/ROXICODONE) 5 MG immediate release tablet 1-2 tablets every 4-6 hrs as needed for pain 04/10/15   Kirstin Shepperson, PA-C  polyethylene glycol (MIRALAX / GLYCOLAX) packet 17grams in 16 oz of water twice a day until bowel movement.  LAXITIVE.  Restart if two days since last bowel movement 04/10/15   Kirstin Shepperson, PA-C  potassium chloride (K-DUR) 10 MEQ tablet Take 2 tablets (20 mEq total) by mouth daily. 02/07/15   Herminio Commons, MD  vitamin B-12 (CYANOCOBALAMIN) 1000 MCG tablet Take 1,000 mcg by mouth daily.    Historical Provider, MD   BP 165/77 mmHg  Pulse 111  Temp(Src) 98.8 F (37.1 C) (Oral)  Resp 22  Ht 5\' 2"  (1.575 m)  Wt 170 lb (77.111 kg)  BMI 31.09 kg/m2  SpO2 99% Physical Exam   Constitutional: She is oriented to person, place, and time. She appears well-developed and well-nourished. No distress.  HENT:  Head: Normocephalic and atraumatic.  Eyes: Conjunctivae and EOM are normal.  Cardiovascular: An irregularly irregular rhythm present. Tachycardia present.   Pulmonary/Chest: No stridor. She has decreased breath sounds.  Abdominal: She exhibits no distension.  Musculoskeletal: She exhibits no edema.  Neurological: She is alert and oriented to person, place, and time. No cranial nerve deficit.  Skin: Skin is warm and dry.  Psychiatric: She has a normal mood  and affect.  Nursing note and vitals reviewed.   ED Course  Procedures   Labs Review Labs Reviewed  COMPREHENSIVE METABOLIC PANEL - Abnormal; Notable for the following:    Potassium 2.4 (*)    Chloride 95 (*)    CO2 20 (*)    Glucose, Bld 137 (*)    Creatinine, Ser 1.75 (*)    Total Protein 8.2 (*)    GFR calc non Af Amer 28 (*)    GFR calc Af Amer 32 (*)    Anion gap 22 (*)    All other components within normal limits  CBC WITH DIFFERENTIAL/PLATELET - Abnormal; Notable for the following:    WBC 13.8 (*)    Lymphs Abs 7.1 (*)    Monocytes Absolute 1.3 (*)    All other components within normal limits  I-STAT CG4 LACTIC ACID, ED - Abnormal; Notable for the following:    Lactic Acid, Venous 8.27 (*)    All other components within normal limits  LIPASE, BLOOD  BRAIN NATRIURETIC PEPTIDE  TROPONIN I  URINALYSIS, ROUTINE W REFLEX MICROSCOPIC (NOT AT [facility])    Imaging Review Dg Chest 2 View  05/25/2015  CLINICAL DATA:  Shortness of Breath EXAM: CHEST  2 VIEW COMPARISON:  01/30/2015 FINDINGS: The heart size and mediastinal contours are within normal limits. Both lungs are clear. The visualized skeletal structures are unremarkable. IMPRESSION: No active cardiopulmonary disease. Electronically Signed   By: Inez Catalina M.D.   On: 05/25/2015 17:29   I have personally reviewed and evaluated these images  and lab results as part of my medical decision-making.  EKG with sinus tachycardia, rate 110, frequent PAC, T-wave abnormality, abnormal Cardiac monitor also has tachycardia, 100, 105, abnormal Pulse oximetry 95% with nasal cannula abnormal  6:07 PM Initial labs notable for lactic acidosis, greater than 8, hypokalemia, 2.4.  Patient tends to receive IV fluid resuscitation, IV potassium, oral potassium.  Additional labs pending.  6:37 PM Hr 90's, paresthesia has resolved.   9:24 PM Patient ambulatory to the bathroom. Vital signs remain reassuring, blood pressure 143/60. Urinalysis pending, repeat lactic pending.   Update: Repeat lactic normalized. Patient continues to receive potassium, IV, states that she feels somewhat better, and though she was ambulatory, she feels particularly weak with ambulation.  MDM  Patient presents with weakness, numbness in her distal extremities. Patient describes symptoms that have progressed for some time, but became acute today. Patient multiple medical issues, most prominently COPD Here, the patient is found to be hypokalemic, with a notable lactic acidosis. No evidence for ongoing infection, and there suspicion for heightened metabolic demand w subsequent dehydration and electrolyte abnormality. With the patient's lactic acid of greater than 8, she received fluid resuscitation, IV potassium repletion. Repeat lactic acidosis is reassuring. However, given the patient's critically abnormal values, she was admitted for further evaluation, management.  Carmin Muskrat, MD 05/25/15 2144  CRITICAL CARE Performed by: Carmin Muskrat Total critical care time: 40 minutes Critical care time was exclusive of separately billable procedures and treating other patients. Critical care was necessary to treat or prevent imminent or life-threatening deterioration. Critical care was time spent personally by me on the following activities: development of  treatment plan with patient and/or surrogate as well as nursing, discussions with consultants, evaluation of patient's response to treatment, examination of patient, obtaining history from patient or surrogate, ordering and performing treatments and interventions, ordering and review of laboratory studies, ordering and review of radiographic studies, pulse oximetry and re-evaluation of  patient's condition.   Carmin Muskrat, MD 05/25/15 2144

## 2015-05-25 NOTE — ED Notes (Signed)
CRITICAL VALUE ALERT  Critical value received:  Potassium 2.4  Date of notification:  05/25/15  Time of notification:  1805  Critical value read back:Yes.    Nurse who received alert:  Domenica Reamer RN   MD notified (1st page):  Dr Vanita Panda  Time of first page:  1806  MD notified (2nd page):  Time of second page:  Responding MD:  Dr Vanita Panda   Time MD responded:  1806

## 2015-05-25 NOTE — ED Notes (Signed)
Hospitalist at the bedside 

## 2015-05-25 NOTE — ED Notes (Signed)
CRITICAL VALUE ALERT  Critical value received:  Lactic acid 8.27  Date of notification:  05/25/15  Time of notification:  [id number]  Critical value read back:Yes.    Nurse who received alert:  Domenica Reamer RN   MD notified (1st page): Dr Vanita Panda  Time of first page:  1751  MD notified (2nd page):  Time of second page:  Responding MD:  Dr Vanita Panda  Time MD responded:  [phone]

## 2015-05-26 DIAGNOSIS — N179 Acute kidney failure, unspecified: Secondary | ICD-10-CM

## 2015-05-26 DIAGNOSIS — E860 Dehydration: Secondary | ICD-10-CM

## 2015-05-26 DIAGNOSIS — J439 Emphysema, unspecified: Secondary | ICD-10-CM

## 2015-05-26 DIAGNOSIS — E876 Hypokalemia: Principal | ICD-10-CM

## 2015-05-26 DIAGNOSIS — I10 Essential (primary) hypertension: Secondary | ICD-10-CM

## 2015-05-26 LAB — BASIC METABOLIC PANEL
ANION GAP: 10 (ref 5–15)
BUN: 14 mg/dL (ref 6–20)
CHLORIDE: 105 mmol/L (ref 101–111)
CO2: 23 mmol/L (ref 22–32)
Calcium: 8.2 mg/dL — ABNORMAL LOW (ref 8.9–10.3)
Creatinine, Ser: 1.4 mg/dL — ABNORMAL HIGH (ref 0.44–1.00)
GFR calc Af Amer: 42 mL/min — ABNORMAL LOW (ref >60)
GFR calc non Af Amer: 37 mL/min — ABNORMAL LOW (ref >60)
GLUCOSE: 102 mg/dL — AB (ref 65–99)
POTASSIUM: 3.4 mmol/L — AB (ref 3.5–5.1)
Sodium: 138 mmol/L (ref 135–145)

## 2015-05-26 LAB — GLUCOSE, CAPILLARY: Glucose-Capillary: 87 mg/dL (ref 65–99)

## 2015-05-26 MED ORDER — POTASSIUM CHLORIDE CRYS ER 20 MEQ PO TBCR
40.0000 meq | EXTENDED_RELEASE_TABLET | Freq: Once | ORAL | Status: AC
  Administered 2015-05-26: 40 meq via ORAL
  Filled 2015-05-26: qty 2

## 2015-05-26 MED ORDER — ACYCLOVIR 200 MG PO CAPS
400.0000 mg | ORAL_CAPSULE | Freq: Every day | ORAL | Status: DC
  Administered 2015-05-26: 400 mg via ORAL
  Filled 2015-05-26: qty 2

## 2015-05-26 MED ORDER — METOPROLOL TARTRATE 25 MG PO TABS: 25.0000 mg | ORAL_TABLET | Freq: Two times a day (BID) | ORAL | Status: DC

## 2015-05-26 MED ORDER — ENSURE ENLIVE PO LIQD: 237.0000 mL | Freq: Two times a day (BID) | ORAL | Status: DC

## 2015-05-26 MED ORDER — MAGNESIUM SULFATE 2 GM/50ML IV SOLN
2.0000 g | Freq: Once | INTRAVENOUS | Status: AC
  Administered 2015-05-26: 2 g via INTRAVENOUS
  Filled 2015-05-26: qty 50

## 2015-05-26 NOTE — Discharge Summary (Addendum)
Physician Discharge Summary  Annette Sharp [id number] DOB: 1940/08/18 DOA: 05/25/2015  PCP: Annette Percy, MD  Admit date: 05/25/2015 Discharge date: 05/26/2015  Time spent: 35 minutes  Recommendations for Outpatient Follow-up:  1. Follow up with PCP in 1-2 weeks.  2. Repeat BMET and CBC in 1 week.   Discharge Diagnoses:  Principal Problem:   Hypokalemia Active Problems:   Essential hypertension   CAD, NATIVE VESSEL   Chronic kidney disease   Dehydration   Acute kidney injury (HCC)   COPD (chronic obstructive pulmonary disease) (HCC)   Paresthesia   Lactic acidosis   Lymphocytosis   Discharge Condition: Improved  Diet recommendation: Heart Healthy  Filed Weights   05/25/15 1641 05/25/15 1643  Weight: 77.111 kg (170 lb) 77.111 kg (170 lb)    History of present illness:  68 yof with a hx of CAD, HTN, CKD stage III, COPD, and hypothyroidism presented with fatigue and generalized paresthesias. Patient reports a recent GI illness with 1 week of severe watery diarrhea. While in the ED, labs revealed a potassium of 2.4 and dehydration. She was admitted for further management.  Hospital Course:  Patient presented with hypokalemia, potassium 2.4 on admission. It was likely related to a recent GI illness with 1 week of watery diarrhea in addition to chlorthalidone use. She was given IV Potassium and had her chlorthalidone held with significant improvement. On discharge her potassium is within normal limits and patient is feeling significantly improved with resolution in her paresthesias. She is ambulating without difficulty and will need a repeat BMP in 1 week. Chlorthalidone will be discontinued upon discharge.  1. Dehydration, secondary to recent GI illness. Resolved with IVF. She was advised to continue with oral hydration.  2. Hypomagnesemia, repleted. 3. AKI superimposed on CKD stage III, baseline creatinine appears to be 1.2. Resolved with hydration.  4. Essential HTN,  stable. Patient reports fatigue since starting Chlorthalidone. Given side effects and hypokalemia, this will be discontinued upon discharge. She will be started on low dose Metoprolol with further adjustments per her PCP. 5. Lymphocytosis, etiology unknown. Recommend outpatient follow up and CBC in 1 week. 6. COPD without evidence of exacerbation. Continue bronchodilators as needed. 7. Hypothyroidism, continue Synthroid.   Procedures:  none  Consultations:  none  Discharge Exam: Filed Vitals:   05/26/15 0623 05/26/15 1101  BP: 124/49 152/69  Pulse: 71 77  Temp: 98.2 F (36.8 C)   Resp: 20      General: NAD, looks comfortable  Cardiovascular: RRR, S1, S2   Respiratory: clear bilaterally, No wheezing, rales or rhonchi  Abdomen: soft, non tender, no distention , bowel sounds normal  Musculoskeletal: No edema b/l   Discharge Instructions   Discharge Instructions    Diet - low sodium heart healthy    Complete by:  As directed      Increase activity slowly    Complete by:  As directed           Current Discharge Medication List    START taking these medications   Details  metoprolol tartrate (LOPRESSOR) 25 MG tablet Take 1 tablet (25 mg total) by mouth 2 (two) times daily. Qty: 60 tablet, Refills: 0      CONTINUE these medications which have NOT CHANGED   Details  acyclovir (ZOVIRAX) 400 MG tablet Take 400 mg by mouth every morning.     albuterol (PROVENTIL HFA) 108 (90 BASE) MCG/ACT inhaler Inhale 1 puff into the lungs every 6 (six) hours as needed for  shortness of breath. Qty: 1 Inhaler, Refills: 1    amLODipine (NORVASC) 10 MG tablet Take 10 mg by mouth every morning. Refills: 3    aspirin EC 81 MG tablet Take 81 mg by mouth daily.    Bisacodyl (LAXATIVE PO) Take 1 tablet by mouth daily as needed (Constipation).    cetirizine (ZYRTEC) 10 MG tablet Take 10 mg by mouth daily.    citalopram (CELEXA) 20 MG tablet Take 20 mg by mouth every morning.      clopidogrel (PLAVIX) 75 MG tablet Take 75 mg by mouth every morning.     cyclobenzaprine (FLEXERIL) 5 MG tablet Take 5 mg by mouth 3 (three) times daily as needed for muscle spasms.    isosorbide mononitrate (IMDUR) 30 MG 24 hr tablet Take 1 tablet (30 mg total) by mouth 2 (two) times daily. Qty: 60 tablet, Refills: 6    levothyroxine (SYNTHROID, LEVOTHROID) 88 MCG tablet Take 88 mcg by mouth every morning.     omeprazole (PRILOSEC) 20 MG capsule Take 20 mg by mouth every morning.     polyethylene glycol (MIRALAX / GLYCOLAX) packet 17grams in 16 oz of water twice a day until bowel movement.  LAXITIVE.  Restart if two days since last bowel movement Qty: 60 each, Refills: 0    vitamin B-12 (CYANOCOBALAMIN) 1000 MCG tablet Take 1,000 mcg by mouth daily.    betamethasone valerate (VALISONE) 0.1 % cream Apply 1 application topically daily as needed (for lichen scheleros).     docusate sodium (COLACE) 100 MG capsule 1 tab 2 times a day while on narcotics.  STOOL SOFTENER Qty: 60 capsule, Refills: 0    oxyCODONE (OXY IR/ROXICODONE) 5 MG immediate release tablet 1-2 tablets every 4-6 hrs as needed for pain Qty: 100 tablet, Refills: 0      STOP taking these medications     chlorthalidone (HYGROTON) 25 MG tablet      potassium chloride (K-DUR) 10 MEQ tablet        Allergies  Allergen Reactions  . Ace Inhibitors Cough  . Gemfibrozil Other (See Comments)    Muscle Aches  . Statins     REACTION: Muscle Cramps  . Moxifloxacin Rash  . Ranexa [Ranolazine] Other (See Comments)    "NIGHTMARES"   Follow-up Information    Follow up with Annette Percy, MD.   Specialty:  Family Medicine   Why:  Call to schedule follow-up appointment in 1-2 weeks.   Contact information:   [address] [place] [address] [phone]        The results of significant diagnostics from this hospitalization (including imaging, microbiology, ancillary and laboratory) are listed below for reference.     Significant Diagnostic Studies: Dg Chest 2 View  05/25/2015  CLINICAL DATA:  Shortness of Breath EXAM: CHEST  2 VIEW COMPARISON:  01/30/2015 FINDINGS: The heart size and mediastinal contours are within normal limits. Both lungs are clear. The visualized skeletal structures are unremarkable. IMPRESSION: No active cardiopulmonary disease. Electronically Signed   By: Inez Catalina M.D.   On: 05/25/2015 17:29      Labs: Basic Metabolic Panel:  Recent Labs Lab 05/25/15 1640 05/26/15 0103  NA 137 138  K 2.4* 3.4*  CL 95* 105  CO2 20* 23  GLUCOSE 137* 102*  BUN 16 14  CREATININE 1.75* 1.40*  CALCIUM 9.7 8.2*  MG 1.6*  --    Liver Function Tests:  Recent Labs Lab 05/25/15 1640  AST 31  ALT 18  ALKPHOS 67  BILITOT 0.7  PROT 8.2*  ALBUMIN 4.4    Recent Labs Lab 05/25/15 1640  LIPASE 23    CBC:  Recent Labs Lab 05/25/15 1640  WBC 13.8*  NEUTROABS 5.1  HGB 14.4  HCT 40.1  MCV 88.7  PLT 355   Cardiac Enzymes:  Recent Labs Lab 05/25/15 1640  TROPONINI <0.03   BNP: BNP (last 3 results)  Recent Labs  05/25/15 1640  BNP 35.0     CBG:  Recent Labs Lab 05/26/15 1205  GLUCAP 87     Signed: Jaedyn Sharp. MD [facility] 05/26/2015, 3:56 PM    By signing my name below, I, Annette Sharp, attest that this documentation has been prepared under the direction and in the presence of [facility]. MD Electronically Signed: Rosalie Sharp, Scribe. 05/26/2015  I, Dr. Kathie Dike, personally performed the services described in this documentaiton. All medical record entries made by the scribe were at my direction and in my presence. I have reviewed the chart and agree that the record reflects my personal performance and is accurate and complete  Kathie Dike, MD, 05/26/2015 3:56 PM

## 2015-05-26 NOTE — Progress Notes (Signed)
AVS reviewed with patient.  Verbalized understanding of discharge instructions, physician follow-up, and medications.  Prescription provided to patient for Metoprolol.  Patient's IV removed.  Site WNL.  Patient reports belongings intact and in possession at time of discharge.  Patient transported by RN via w/c to main entrance for discharge.  Patient transported home by husband in private vehicle.  Patient stable at time of discharge.

## 2015-05-28 DIAGNOSIS — R2689 Other abnormalities of gait and mobility: Secondary | ICD-10-CM | POA: Diagnosis not present

## 2015-05-28 DIAGNOSIS — M25662 Stiffness of left knee, not elsewhere classified: Secondary | ICD-10-CM | POA: Diagnosis not present

## 2015-05-28 DIAGNOSIS — M6281 Muscle weakness (generalized): Secondary | ICD-10-CM | POA: Diagnosis not present

## 2015-05-28 NOTE — Telephone Encounter (Signed)
Medication was sent in by pmd.

## 2015-06-13 DIAGNOSIS — N183 Chronic kidney disease, stage 3 (moderate): Secondary | ICD-10-CM | POA: Diagnosis not present

## 2015-06-13 DIAGNOSIS — E039 Hypothyroidism, unspecified: Secondary | ICD-10-CM | POA: Diagnosis not present

## 2015-06-13 DIAGNOSIS — E876 Hypokalemia: Secondary | ICD-10-CM | POA: Diagnosis not present

## 2015-06-13 DIAGNOSIS — D519 Vitamin B12 deficiency anemia, unspecified: Secondary | ICD-10-CM | POA: Diagnosis not present

## 2015-06-25 DIAGNOSIS — Z96652 Presence of left artificial knee joint: Secondary | ICD-10-CM | POA: Diagnosis not present

## 2015-07-01 DIAGNOSIS — M25662 Stiffness of left knee, not elsewhere classified: Secondary | ICD-10-CM | POA: Diagnosis not present

## 2015-07-01 DIAGNOSIS — R2689 Other abnormalities of gait and mobility: Secondary | ICD-10-CM | POA: Diagnosis not present

## 2015-07-01 DIAGNOSIS — M6281 Muscle weakness (generalized): Secondary | ICD-10-CM | POA: Diagnosis not present

## 2015-07-04 DIAGNOSIS — M25662 Stiffness of left knee, not elsewhere classified: Secondary | ICD-10-CM | POA: Diagnosis not present

## 2015-07-04 DIAGNOSIS — M6281 Muscle weakness (generalized): Secondary | ICD-10-CM | POA: Diagnosis not present

## 2015-07-04 DIAGNOSIS — R2689 Other abnormalities of gait and mobility: Secondary | ICD-10-CM | POA: Diagnosis not present

## 2015-07-16 DIAGNOSIS — M25662 Stiffness of left knee, not elsewhere classified: Secondary | ICD-10-CM | POA: Diagnosis not present

## 2015-07-16 DIAGNOSIS — M6281 Muscle weakness (generalized): Secondary | ICD-10-CM | POA: Diagnosis not present

## 2015-07-16 DIAGNOSIS — R2689 Other abnormalities of gait and mobility: Secondary | ICD-10-CM | POA: Diagnosis not present

## 2015-07-19 DIAGNOSIS — R2689 Other abnormalities of gait and mobility: Secondary | ICD-10-CM | POA: Diagnosis not present

## 2015-07-19 DIAGNOSIS — M25662 Stiffness of left knee, not elsewhere classified: Secondary | ICD-10-CM | POA: Diagnosis not present

## 2015-07-19 DIAGNOSIS — M6281 Muscle weakness (generalized): Secondary | ICD-10-CM | POA: Diagnosis not present

## 2015-07-22 DIAGNOSIS — M25662 Stiffness of left knee, not elsewhere classified: Secondary | ICD-10-CM | POA: Diagnosis not present

## 2015-07-22 DIAGNOSIS — R2689 Other abnormalities of gait and mobility: Secondary | ICD-10-CM | POA: Diagnosis not present

## 2015-07-22 DIAGNOSIS — M6281 Muscle weakness (generalized): Secondary | ICD-10-CM | POA: Diagnosis not present

## 2015-07-26 DIAGNOSIS — M25662 Stiffness of left knee, not elsewhere classified: Secondary | ICD-10-CM | POA: Diagnosis not present

## 2015-07-26 DIAGNOSIS — M6281 Muscle weakness (generalized): Secondary | ICD-10-CM | POA: Diagnosis not present

## 2015-07-26 DIAGNOSIS — R2689 Other abnormalities of gait and mobility: Secondary | ICD-10-CM | POA: Diagnosis not present

## 2015-08-02 DIAGNOSIS — M25662 Stiffness of left knee, not elsewhere classified: Secondary | ICD-10-CM | POA: Diagnosis not present

## 2015-08-02 DIAGNOSIS — M6281 Muscle weakness (generalized): Secondary | ICD-10-CM | POA: Diagnosis not present

## 2015-08-02 DIAGNOSIS — R2689 Other abnormalities of gait and mobility: Secondary | ICD-10-CM | POA: Diagnosis not present

## 2015-08-06 DIAGNOSIS — M1712 Unilateral primary osteoarthritis, left knee: Secondary | ICD-10-CM | POA: Diagnosis not present

## 2015-08-07 DIAGNOSIS — E039 Hypothyroidism, unspecified: Secondary | ICD-10-CM | POA: Diagnosis not present

## 2015-08-07 DIAGNOSIS — D519 Vitamin B12 deficiency anemia, unspecified: Secondary | ICD-10-CM | POA: Diagnosis not present

## 2015-08-07 DIAGNOSIS — I10 Essential (primary) hypertension: Secondary | ICD-10-CM | POA: Diagnosis not present

## 2015-08-13 ENCOUNTER — Encounter (INDEPENDENT_AMBULATORY_CARE_PROVIDER_SITE_OTHER): Payer: Self-pay | Admitting: *Deleted

## 2015-08-30 DIAGNOSIS — E860 Dehydration: Secondary | ICD-10-CM | POA: Diagnosis not present

## 2015-08-30 DIAGNOSIS — R05 Cough: Secondary | ICD-10-CM | POA: Diagnosis not present

## 2015-08-30 DIAGNOSIS — J069 Acute upper respiratory infection, unspecified: Secondary | ICD-10-CM | POA: Diagnosis not present

## 2015-10-12 DIAGNOSIS — H40003 Preglaucoma, unspecified, bilateral: Secondary | ICD-10-CM | POA: Diagnosis not present

## 2015-10-12 DIAGNOSIS — H5213 Myopia, bilateral: Secondary | ICD-10-CM | POA: Diagnosis not present

## 2015-11-14 DIAGNOSIS — D519 Vitamin B12 deficiency anemia, unspecified: Secondary | ICD-10-CM | POA: Diagnosis not present

## 2015-11-14 DIAGNOSIS — M10079 Idiopathic gout, unspecified ankle and foot: Secondary | ICD-10-CM | POA: Diagnosis not present

## 2015-11-14 DIAGNOSIS — M129 Arthropathy, unspecified: Secondary | ICD-10-CM | POA: Diagnosis not present

## 2015-11-14 DIAGNOSIS — E860 Dehydration: Secondary | ICD-10-CM | POA: Diagnosis not present

## 2015-11-14 DIAGNOSIS — Z6835 Body mass index (BMI) 35.0-35.9, adult: Secondary | ICD-10-CM | POA: Diagnosis not present

## 2015-11-14 DIAGNOSIS — F3289 Other specified depressive episodes: Secondary | ICD-10-CM | POA: Diagnosis not present

## 2015-11-14 DIAGNOSIS — E039 Hypothyroidism, unspecified: Secondary | ICD-10-CM | POA: Diagnosis not present

## 2015-11-14 DIAGNOSIS — E876 Hypokalemia: Secondary | ICD-10-CM | POA: Diagnosis not present

## 2015-11-14 DIAGNOSIS — G629 Polyneuropathy, unspecified: Secondary | ICD-10-CM | POA: Diagnosis not present

## 2015-11-21 DIAGNOSIS — R930 Abnormal findings on diagnostic imaging of skull and head, not elsewhere classified: Secondary | ICD-10-CM | POA: Diagnosis not present

## 2015-11-21 DIAGNOSIS — Z9181 History of falling: Secondary | ICD-10-CM | POA: Diagnosis not present

## 2015-11-21 DIAGNOSIS — R410 Disorientation, unspecified: Secondary | ICD-10-CM | POA: Diagnosis not present

## 2015-11-21 DIAGNOSIS — R202 Paresthesia of skin: Secondary | ICD-10-CM | POA: Diagnosis not present

## 2015-11-28 ENCOUNTER — Other Ambulatory Visit: Payer: Self-pay

## 2015-12-30 IMAGING — CR DG CHEST 2V
2 series · 2 of 2 positions shown · non-contrast
Comparison: 10/22/2011

CLINICAL DATA: Chest tightness, shortness of breath.

EXAM:
CHEST  2 VIEW

[view not recorded (1 of 2)]
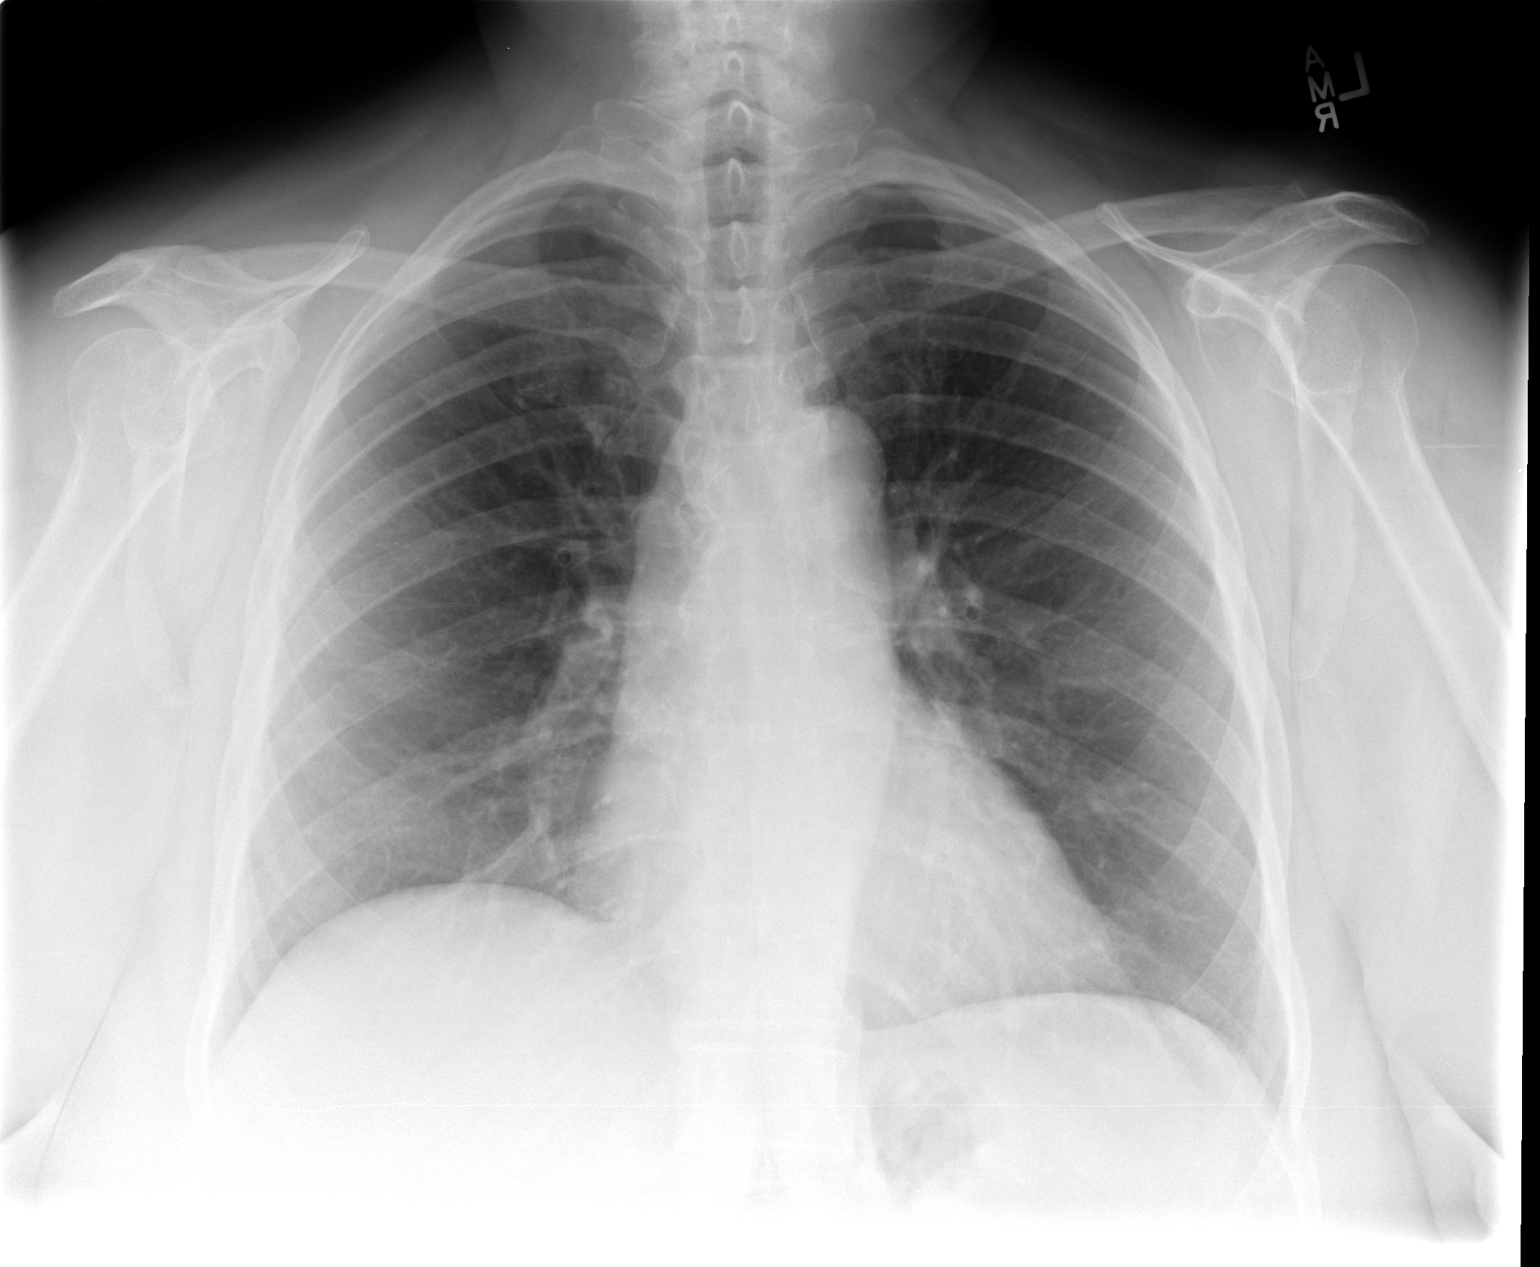

[view not recorded (2 of 2)]
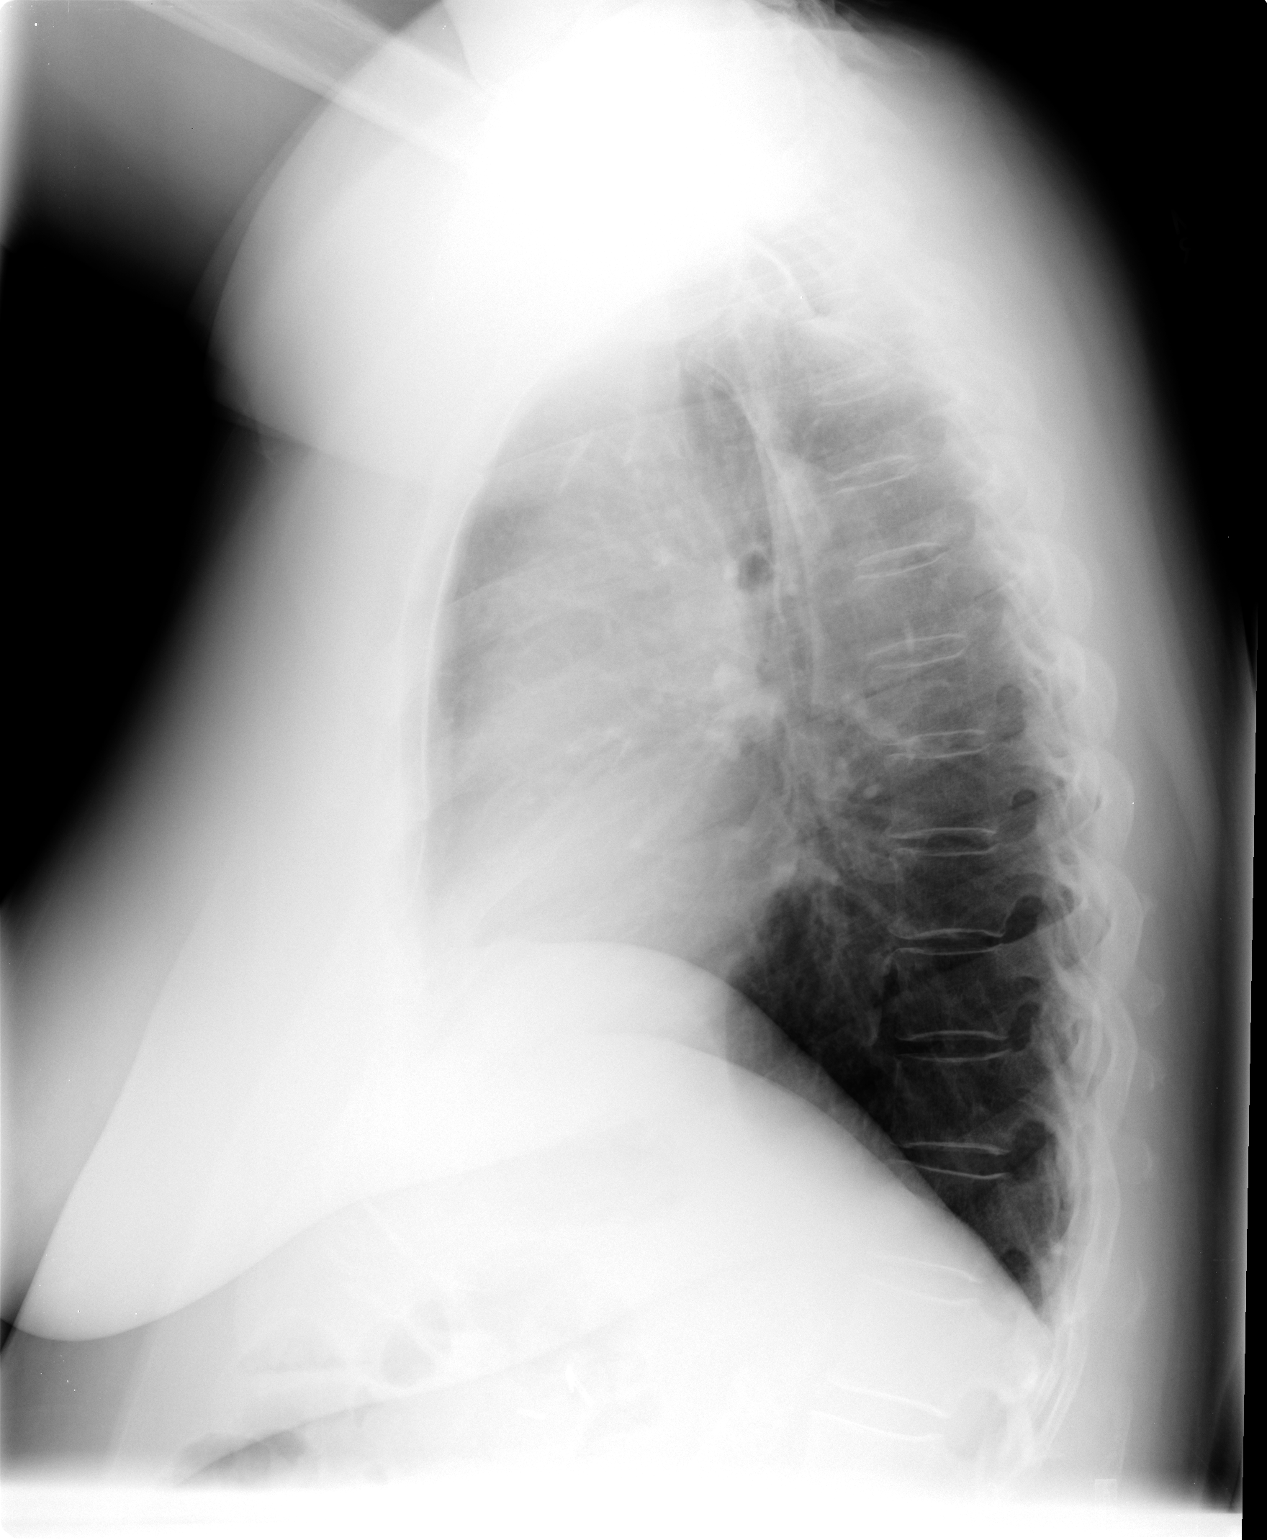

[2 of 2 positions shown; findings below may reference images not displayed]

FINDINGS: The heart size and mediastinal contours are within normal limits.
Both lungs are clear. The visualized skeletal structures are
unremarkable.
IMPRESSION: No active cardiopulmonary disease.

## 2016-01-14 DIAGNOSIS — R5383 Other fatigue: Secondary | ICD-10-CM | POA: Diagnosis not present

## 2016-01-14 DIAGNOSIS — M5136 Other intervertebral disc degeneration, lumbar region: Secondary | ICD-10-CM | POA: Diagnosis not present

## 2016-01-14 DIAGNOSIS — Z6836 Body mass index (BMI) 36.0-36.9, adult: Secondary | ICD-10-CM | POA: Diagnosis not present

## 2016-01-14 DIAGNOSIS — M25551 Pain in right hip: Secondary | ICD-10-CM | POA: Diagnosis not present

## 2016-01-14 DIAGNOSIS — M25552 Pain in left hip: Secondary | ICD-10-CM | POA: Diagnosis not present

## 2016-01-14 DIAGNOSIS — M4316 Spondylolisthesis, lumbar region: Secondary | ICD-10-CM | POA: Diagnosis not present

## 2016-02-01 DIAGNOSIS — Z23 Encounter for immunization: Secondary | ICD-10-CM | POA: Diagnosis not present

## 2016-02-11 DIAGNOSIS — M1712 Unilateral primary osteoarthritis, left knee: Secondary | ICD-10-CM | POA: Diagnosis not present

## 2016-03-02 DIAGNOSIS — E876 Hypokalemia: Secondary | ICD-10-CM | POA: Diagnosis not present

## 2016-03-02 DIAGNOSIS — K219 Gastro-esophageal reflux disease without esophagitis: Secondary | ICD-10-CM | POA: Diagnosis not present

## 2016-03-02 DIAGNOSIS — D519 Vitamin B12 deficiency anemia, unspecified: Secondary | ICD-10-CM | POA: Diagnosis not present

## 2016-03-02 DIAGNOSIS — J449 Chronic obstructive pulmonary disease, unspecified: Secondary | ICD-10-CM | POA: Diagnosis not present

## 2016-03-02 DIAGNOSIS — I10 Essential (primary) hypertension: Secondary | ICD-10-CM | POA: Diagnosis not present

## 2016-03-02 DIAGNOSIS — E7800 Pure hypercholesterolemia, unspecified: Secondary | ICD-10-CM | POA: Diagnosis not present

## 2016-03-02 DIAGNOSIS — E039 Hypothyroidism, unspecified: Secondary | ICD-10-CM | POA: Diagnosis not present

## 2016-03-05 DIAGNOSIS — F3289 Other specified depressive episodes: Secondary | ICD-10-CM | POA: Diagnosis not present

## 2016-03-05 DIAGNOSIS — E039 Hypothyroidism, unspecified: Secondary | ICD-10-CM | POA: Diagnosis not present

## 2016-03-05 DIAGNOSIS — Z0001 Encounter for general adult medical examination with abnormal findings: Secondary | ICD-10-CM | POA: Diagnosis not present

## 2016-03-05 DIAGNOSIS — I259 Chronic ischemic heart disease, unspecified: Secondary | ICD-10-CM | POA: Diagnosis not present

## 2016-03-05 DIAGNOSIS — M10079 Idiopathic gout, unspecified ankle and foot: Secondary | ICD-10-CM | POA: Diagnosis not present

## 2016-03-05 DIAGNOSIS — E7800 Pure hypercholesterolemia, unspecified: Secondary | ICD-10-CM | POA: Diagnosis not present

## 2016-03-05 DIAGNOSIS — D519 Vitamin B12 deficiency anemia, unspecified: Secondary | ICD-10-CM | POA: Diagnosis not present

## 2016-03-31 DIAGNOSIS — Z1231 Encounter for screening mammogram for malignant neoplasm of breast: Secondary | ICD-10-CM | POA: Diagnosis not present

## 2016-06-02 DIAGNOSIS — I10 Essential (primary) hypertension: Secondary | ICD-10-CM | POA: Diagnosis not present

## 2016-06-02 DIAGNOSIS — E039 Hypothyroidism, unspecified: Secondary | ICD-10-CM | POA: Diagnosis not present

## 2016-06-02 DIAGNOSIS — E7800 Pure hypercholesterolemia, unspecified: Secondary | ICD-10-CM | POA: Diagnosis not present

## 2016-06-04 DIAGNOSIS — Z79899 Other long term (current) drug therapy: Secondary | ICD-10-CM | POA: Diagnosis not present

## 2016-06-04 DIAGNOSIS — E039 Hypothyroidism, unspecified: Secondary | ICD-10-CM | POA: Diagnosis not present

## 2016-06-04 DIAGNOSIS — M85852 Other specified disorders of bone density and structure, left thigh: Secondary | ICD-10-CM | POA: Diagnosis not present

## 2016-06-04 DIAGNOSIS — Z96653 Presence of artificial knee joint, bilateral: Secondary | ICD-10-CM | POA: Diagnosis not present

## 2016-06-04 DIAGNOSIS — Z7982 Long term (current) use of aspirin: Secondary | ICD-10-CM | POA: Diagnosis not present

## 2016-06-04 DIAGNOSIS — I10 Essential (primary) hypertension: Secondary | ICD-10-CM | POA: Diagnosis not present

## 2016-06-04 DIAGNOSIS — M810 Age-related osteoporosis without current pathological fracture: Secondary | ICD-10-CM | POA: Diagnosis not present

## 2016-06-04 DIAGNOSIS — Z90710 Acquired absence of both cervix and uterus: Secondary | ICD-10-CM | POA: Diagnosis not present

## 2016-06-15 DIAGNOSIS — E039 Hypothyroidism, unspecified: Secondary | ICD-10-CM | POA: Diagnosis not present

## 2016-06-15 DIAGNOSIS — I639 Cerebral infarction, unspecified: Secondary | ICD-10-CM | POA: Diagnosis not present

## 2016-06-15 DIAGNOSIS — N183 Chronic kidney disease, stage 3 (moderate): Secondary | ICD-10-CM | POA: Diagnosis not present

## 2016-06-15 DIAGNOSIS — I259 Chronic ischemic heart disease, unspecified: Secondary | ICD-10-CM | POA: Diagnosis not present

## 2016-06-15 DIAGNOSIS — I10 Essential (primary) hypertension: Secondary | ICD-10-CM | POA: Diagnosis not present

## 2016-06-15 DIAGNOSIS — Z6836 Body mass index (BMI) 36.0-36.9, adult: Secondary | ICD-10-CM | POA: Diagnosis not present

## 2016-12-19 DIAGNOSIS — H40003 Preglaucoma, unspecified, bilateral: Secondary | ICD-10-CM | POA: Diagnosis not present

## 2016-12-26 DIAGNOSIS — Z23 Encounter for immunization: Secondary | ICD-10-CM | POA: Diagnosis not present

## 2017-01-06 DIAGNOSIS — H40003 Preglaucoma, unspecified, bilateral: Secondary | ICD-10-CM | POA: Diagnosis not present

## 2017-01-06 DIAGNOSIS — H2512 Age-related nuclear cataract, left eye: Secondary | ICD-10-CM | POA: Diagnosis not present

## 2017-01-06 DIAGNOSIS — H47323 Drusen of optic disc, bilateral: Secondary | ICD-10-CM | POA: Diagnosis not present

## 2017-01-06 DIAGNOSIS — H2511 Age-related nuclear cataract, right eye: Secondary | ICD-10-CM | POA: Diagnosis not present

## 2017-01-22 DIAGNOSIS — M25512 Pain in left shoulder: Secondary | ICD-10-CM | POA: Diagnosis not present

## 2017-01-22 DIAGNOSIS — S4992XA Unspecified injury of left shoulder and upper arm, initial encounter: Secondary | ICD-10-CM | POA: Diagnosis not present

## 2017-01-22 DIAGNOSIS — W010XXA Fall on same level from slipping, tripping and stumbling without subsequent striking against object, initial encounter: Secondary | ICD-10-CM | POA: Diagnosis not present

## 2017-01-22 DIAGNOSIS — M25522 Pain in left elbow: Secondary | ICD-10-CM | POA: Diagnosis not present

## 2017-01-22 DIAGNOSIS — S0993XA Unspecified injury of face, initial encounter: Secondary | ICD-10-CM | POA: Diagnosis not present

## 2017-01-22 DIAGNOSIS — S199XXA Unspecified injury of neck, initial encounter: Secondary | ICD-10-CM | POA: Diagnosis not present

## 2017-01-22 DIAGNOSIS — Z7982 Long term (current) use of aspirin: Secondary | ICD-10-CM | POA: Diagnosis not present

## 2017-01-22 DIAGNOSIS — S40012A Contusion of left shoulder, initial encounter: Secondary | ICD-10-CM | POA: Diagnosis not present

## 2017-01-22 DIAGNOSIS — F17200 Nicotine dependence, unspecified, uncomplicated: Secondary | ICD-10-CM | POA: Diagnosis not present

## 2017-01-22 DIAGNOSIS — S0083XA Contusion of other part of head, initial encounter: Secondary | ICD-10-CM | POA: Diagnosis not present

## 2017-01-22 DIAGNOSIS — Z8673 Personal history of transient ischemic attack (TIA), and cerebral infarction without residual deficits: Secondary | ICD-10-CM | POA: Diagnosis not present

## 2017-01-22 DIAGNOSIS — Z23 Encounter for immunization: Secondary | ICD-10-CM | POA: Diagnosis not present

## 2017-01-22 DIAGNOSIS — S0990XA Unspecified injury of head, initial encounter: Secondary | ICD-10-CM | POA: Diagnosis not present

## 2017-01-22 DIAGNOSIS — S0081XA Abrasion of other part of head, initial encounter: Secondary | ICD-10-CM | POA: Diagnosis not present

## 2017-01-22 DIAGNOSIS — E7800 Pure hypercholesterolemia, unspecified: Secondary | ICD-10-CM | POA: Diagnosis not present

## 2017-01-22 DIAGNOSIS — I10 Essential (primary) hypertension: Secondary | ICD-10-CM | POA: Diagnosis not present

## 2017-01-22 DIAGNOSIS — Z7902 Long term (current) use of antithrombotics/antiplatelets: Secondary | ICD-10-CM | POA: Diagnosis not present

## 2017-01-22 DIAGNOSIS — R51 Headache: Secondary | ICD-10-CM | POA: Diagnosis not present

## 2017-01-22 DIAGNOSIS — Z79899 Other long term (current) drug therapy: Secondary | ICD-10-CM | POA: Diagnosis not present

## 2017-01-27 DIAGNOSIS — S4992XA Unspecified injury of left shoulder and upper arm, initial encounter: Secondary | ICD-10-CM | POA: Diagnosis not present

## 2017-01-27 DIAGNOSIS — Z6837 Body mass index (BMI) 37.0-37.9, adult: Secondary | ICD-10-CM | POA: Diagnosis not present

## 2017-02-03 DIAGNOSIS — S46812A Strain of other muscles, fascia and tendons at shoulder and upper arm level, left arm, initial encounter: Secondary | ICD-10-CM | POA: Diagnosis not present

## 2017-02-03 DIAGNOSIS — S4992XA Unspecified injury of left shoulder and upper arm, initial encounter: Secondary | ICD-10-CM | POA: Diagnosis not present

## 2017-02-03 DIAGNOSIS — M19012 Primary osteoarthritis, left shoulder: Secondary | ICD-10-CM | POA: Diagnosis not present

## 2017-02-03 DIAGNOSIS — S46912A Strain of unspecified muscle, fascia and tendon at shoulder and upper arm level, left arm, initial encounter: Secondary | ICD-10-CM | POA: Diagnosis not present

## 2017-02-03 DIAGNOSIS — M75102 Unspecified rotator cuff tear or rupture of left shoulder, not specified as traumatic: Secondary | ICD-10-CM | POA: Diagnosis not present

## 2017-02-03 DIAGNOSIS — M25412 Effusion, left shoulder: Secondary | ICD-10-CM | POA: Diagnosis not present

## 2017-03-04 DIAGNOSIS — R5383 Other fatigue: Secondary | ICD-10-CM | POA: Diagnosis not present

## 2017-03-04 DIAGNOSIS — D519 Vitamin B12 deficiency anemia, unspecified: Secondary | ICD-10-CM | POA: Diagnosis not present

## 2017-03-04 DIAGNOSIS — I10 Essential (primary) hypertension: Secondary | ICD-10-CM | POA: Diagnosis not present

## 2017-03-04 DIAGNOSIS — N183 Chronic kidney disease, stage 3 (moderate): Secondary | ICD-10-CM | POA: Diagnosis not present

## 2017-03-04 DIAGNOSIS — I259 Chronic ischemic heart disease, unspecified: Secondary | ICD-10-CM | POA: Diagnosis not present

## 2017-03-04 DIAGNOSIS — E039 Hypothyroidism, unspecified: Secondary | ICD-10-CM | POA: Diagnosis not present

## 2017-03-04 DIAGNOSIS — E7800 Pure hypercholesterolemia, unspecified: Secondary | ICD-10-CM | POA: Diagnosis not present

## 2017-03-04 DIAGNOSIS — E876 Hypokalemia: Secondary | ICD-10-CM | POA: Diagnosis not present

## 2017-03-04 DIAGNOSIS — M10079 Idiopathic gout, unspecified ankle and foot: Secondary | ICD-10-CM | POA: Diagnosis not present

## 2017-03-04 DIAGNOSIS — K219 Gastro-esophageal reflux disease without esophagitis: Secondary | ICD-10-CM | POA: Diagnosis not present

## 2017-03-22 ENCOUNTER — Emergency Department (HOSPITAL_COMMUNITY): Payer: Medicare Other

## 2017-03-22 ENCOUNTER — Encounter (HOSPITAL_COMMUNITY): Payer: Self-pay | Admitting: Emergency Medicine

## 2017-03-22 ENCOUNTER — Other Ambulatory Visit: Payer: Self-pay

## 2017-03-22 ENCOUNTER — Emergency Department (HOSPITAL_COMMUNITY)
Admission: EM | Admit: 2017-03-22 | Discharge: 2017-03-22 | Disposition: A | Discharge status: Home / Self Care | Payer: Medicare Other | Attending: Emergency Medicine | Admitting: Emergency Medicine

## 2017-03-22 DIAGNOSIS — Y92009 Unspecified place in unspecified non-institutional (private) residence as the place of occurrence of the external cause: Secondary | ICD-10-CM | POA: Insufficient documentation

## 2017-03-22 DIAGNOSIS — M542 Cervicalgia: Secondary | ICD-10-CM | POA: Diagnosis not present

## 2017-03-22 DIAGNOSIS — Z96652 Presence of left artificial knee joint: Secondary | ICD-10-CM | POA: Diagnosis not present

## 2017-03-22 DIAGNOSIS — E039 Hypothyroidism, unspecified: Secondary | ICD-10-CM | POA: Diagnosis not present

## 2017-03-22 DIAGNOSIS — I10 Essential (primary) hypertension: Secondary | ICD-10-CM | POA: Diagnosis not present

## 2017-03-22 DIAGNOSIS — I2510 Atherosclerotic heart disease of native coronary artery without angina pectoris: Secondary | ICD-10-CM | POA: Insufficient documentation

## 2017-03-22 DIAGNOSIS — W0110XA Fall on same level from slipping, tripping and stumbling with subsequent striking against unspecified object, initial encounter: Secondary | ICD-10-CM | POA: Diagnosis not present

## 2017-03-22 DIAGNOSIS — Y999 Unspecified external cause status: Secondary | ICD-10-CM | POA: Insufficient documentation

## 2017-03-22 DIAGNOSIS — Y939 Activity, unspecified: Secondary | ICD-10-CM | POA: Insufficient documentation

## 2017-03-22 DIAGNOSIS — R51 Headache: Secondary | ICD-10-CM | POA: Diagnosis not present

## 2017-03-22 DIAGNOSIS — S0990XA Unspecified injury of head, initial encounter: Secondary | ICD-10-CM | POA: Insufficient documentation

## 2017-03-22 DIAGNOSIS — J449 Chronic obstructive pulmonary disease, unspecified: Secondary | ICD-10-CM | POA: Diagnosis not present

## 2017-03-22 DIAGNOSIS — M545 Low back pain: Secondary | ICD-10-CM | POA: Diagnosis not present

## 2017-03-22 DIAGNOSIS — S199XXA Unspecified injury of neck, initial encounter: Secondary | ICD-10-CM | POA: Diagnosis not present

## 2017-03-22 DIAGNOSIS — S3992XA Unspecified injury of lower back, initial encounter: Secondary | ICD-10-CM | POA: Diagnosis not present

## 2017-03-22 DIAGNOSIS — W19XXXA Unspecified fall, initial encounter: Secondary | ICD-10-CM

## 2017-03-22 NOTE — ED Provider Notes (Signed)
[facility] EMERGENCY DEPARTMENT Provider Note   CSN: [id number] Arrival date & time: 03/22/17  9983     History   Chief Complaint Chief Complaint  Patient presents with  . Fall    HPI Annette Sharp is a 76 y.o. female.   Fall     Pt was seen at [facility]. Per pt, c/o sudden onset and resolution of one episode of slip and fall that occurred PTA. Pt states she was assisting her husband down a ramp at their home when she slipped and fell onto her buttocks, then fell backwards hitting her head. Pt c/o posterior head, neck and low back pains. Denies syncope/LOC, no AMS, no CP/palpitations, no SOB/cough, no abd pain, no N/V/D, no focal motor weakness, no tingling/numbness in extremities.    Past Medical History:  Diagnosis Date  . Anxiety   . Back pain   . Cerebrovascular disease    NONOBSTRUCTIVE CVA BY CAROTID DOPPLERS OCTOBER 2008  . Chronic renal insufficiency    GFR 45 ML'S PER MINUTE  . COPD (chronic obstructive pulmonary disease) ([place])   . Coronary artery disease    S/P CYPHER DRUG-ELUTING STENTS TO RIGHT CORONARY ARTERY JUNE 2007  . Coronary atherosclerosis of native coronary artery    NEGATIVE LEXISCAN FOR ISCHEMIA MARCH 23,2010 NORMAL LV FUNCTTION  . Depression   . Hyperlipidemia   . Hypertension   . Hypothyroidism   . Other and unspecified angina pectoris    ANGINA  . Overweight(278.02)    OBESITY  . Peptic ulcer disease   . Primary localized osteoarthritis of left knee   . Sleep apnea    SEVERE OBSTRUCTIVE    cpap  . Stroke ([place])   . Thyroid disease    TREATED HYPOTHYROIDISM    Patient Active Problem List   Diagnosis Date Noted  . Hypokalemia 05/25/2015  . Dehydration 05/25/2015  . Acute kidney injury ([place]) 05/25/2015  . COPD (chronic obstructive pulmonary disease) ([place]) 05/25/2015  . Paresthesia 05/25/2015  . Lactic acidosis 05/25/2015  . Lymphocytosis 05/25/2015  . DJD (degenerative joint disease) of knee 04/08/2015  . Primary localized  osteoarthritis of left knee   . Coronary artery disease involving native coronary artery   . Dyspnea   . Chronic kidney disease 11/12/2011  . Preoperative clearance 06/24/2010  . SHOULDER PAIN 04/15/2009  . Hyperlipidemia 01/05/2009  . Overweight 01/05/2009  . Essential hypertension 01/05/2009  . Angina decubitus ([place]) 01/05/2009  . CAD, NATIVE VESSEL 01/05/2009    Past Surgical History:  Procedure Laterality Date  . ABDOMINAL HYSTERECTOMY    . CARDIAC CATHETERIZATION N/A 01/22/2015   Procedure: Right/Left Heart Cath and Coronary Angiography;  Surgeon: Wellington Hampshire, MD;  Location: [facility];  Service: Cardiovascular;  Laterality: N/A;  . CARPAL TUNNEL RELEASE    . CHOLECYSTECTOMY    . JOINT REPLACEMENT    . 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 ROTATOR CUFF REPAIR Right 07/08/2009  . TOTAL ABDOMINAL HYSTERECTOMY W/ BILATERAL SALPINGOOPHORECTOMY    . TOTAL KNEE ARTHROPLASTY Left 04/08/2015   Procedure: LEFT TOTAL KNEE ARTHROPLASTY;  Surgeon: Elsie Saas, MD;  Location: [facility];  Service: Orthopedics;  Laterality: Left;    OB History    Gravida Para Term Preterm AB Living             2   SAB TAB Ectopic Multiple Live Births                   Home Medications  Prior to Admission medications   Medication Sig Start Date End Date Taking? Authorizing Provider  acyclovir (ZOVIRAX) 400 MG tablet Take 400 mg by mouth every morning.     [provider]  albuterol (PROVENTIL HFA) 108 (90 BASE) MCG/ACT inhaler Inhale 1 puff into the lungs every 6 (six) hours as needed for shortness of breath. 01/26/13   Herminio Commons, MD  amLODipine (NORVASC) 10 MG tablet Take 10 mg by mouth every morning. 05/04/15   [provider]  aspirin EC 81 MG tablet Take 81 mg by mouth daily.    [provider]  betamethasone valerate (VALISONE) 0.1 % cream Apply 1 application topically daily as needed (for lichen scheleros).     [provider]    Bisacodyl (LAXATIVE PO) Take 1 tablet by mouth daily as needed (Constipation).    [provider]  cetirizine (ZYRTEC) 10 MG tablet Take 10 mg by mouth daily.    [provider]  citalopram (CELEXA) 20 MG tablet Take 20 mg by mouth every morning.     [provider]  clopidogrel (PLAVIX) 75 MG tablet Take 75 mg by mouth every morning.     [provider]  cyclobenzaprine (FLEXERIL) 5 MG tablet Take 5 mg by mouth 3 (three) times daily as needed for muscle spasms.    [provider]  docusate sodium (COLACE) 100 MG capsule 1 tab 2 times a day while on narcotics.  STOOL SOFTENER 04/10/15   Shepperson, Kirstin, PA-C  isosorbide mononitrate (IMDUR) 30 MG 24 hr tablet Take 1 tablet (30 mg total) by mouth 2 (two) times daily. 03/26/14   Herminio Commons, MD  levothyroxine (SYNTHROID, LEVOTHROID) 88 MCG tablet Take 88 mcg by mouth every morning.     [provider]  metoprolol tartrate (LOPRESSOR) 25 MG tablet Take 1 tablet (25 mg total) by mouth 2 (two) times daily. 05/26/15   Kathie Dike, MD  omeprazole (PRILOSEC) 20 MG capsule Take 20 mg by mouth every morning.     [provider]  oxyCODONE (OXY IR/ROXICODONE) 5 MG immediate release tablet 1-2 tablets every 4-6 hrs as needed for pain 04/10/15   Shepperson, Kirstin, PA-C  polyethylene glycol (MIRALAX / GLYCOLAX) packet 17grams in 16 oz of water twice a day until bowel movement.  LAXITIVE.  Restart if two days since last bowel movement Patient taking differently: Take 17 g by mouth daily as needed for mild constipation. 17grams in 16 oz of water twice a day until bowel movement.  LAXITIVE.  Restart if two days since last bowel movement 04/10/15   Shepperson, Kirstin, PA-C  vitamin B-12 (CYANOCOBALAMIN) 1000 MCG tablet Take 1,000 mcg by mouth daily.    [provider]    Family History Family History  Problem Relation Age of Onset  . CAD Unknown   . Hypertension Unknown   .  Heart attack Unknown   . Cancer Unknown     Social History Social History   Tobacco Use  . Smoking status: Never Smoker  . Smokeless tobacco: Never Used  Substance Use Topics  . Alcohol use: No    Alcohol/week: 0.0 oz  . Drug use: No     Allergies   Ace inhibitors; Gemfibrozil; Statins; Moxifloxacin; and Ranexa [ranolazine]   Review of Systems Review of Systems ROS: Statement: All systems negative except as marked or noted in the HPI; Constitutional: Negative for fever and chills. ; ; Eyes: Negative for eye pain, redness and discharge. ; ;  ENMT: Negative for ear pain, hoarseness, nasal congestion, sinus pressure and sore throat. ; ; Cardiovascular: Negative for chest pain, palpitations, diaphoresis, dyspnea and peripheral edema. ; ; Respiratory: Negative for cough, wheezing and stridor. ; ; Gastrointestinal: Negative for nausea, vomiting, diarrhea, abdominal pain, blood in stool, hematemesis, jaundice and rectal bleeding. . ; ; Genitourinary: Negative for dysuria, flank pain and hematuria. ; ; Musculoskeletal: +head injury, back pain and neck pain. Negative for swelling and deformity.; ; Skin: Negative for pruritus, rash, abrasions, blisters, bruising and skin lesion.; ; Neuro: Negative for headache, lightheadedness and neck stiffness. Negative for weakness, altered level of consciousness, altered mental status, extremity weakness, paresthesias, involuntary movement, seizure and syncope.       Physical Exam Updated Vital Signs BP (!) 146/50 (BP Location: Left Arm)   Pulse (!) 48   Temp 98.4 F (36.9 C) (Oral)   Resp 18   Ht 5\' 2"  (1.575 m)   Wt 90.7 kg (200 lb)   SpO2 98%   BMI 36.58 kg/m   Physical Exam 0850: Physical examination:  Nursing notes reviewed; Vital signs and O2 SAT reviewed;  Constitutional: Well developed, Well nourished, Well hydrated, In no acute distress; Head:  Normocephalic, atraumatic. +mild posterior scalp tenderness to palp. No open wounds..; Eyes:  EOMI, PERRL, No scleral icterus; ENMT: Mouth and pharynx normal, Mucous membranes moist; Neck: Supple, Full range of motion, No lymphadenopathy; Cardiovascular: Regular rate and rhythm, No gallop; Respiratory: Breath sounds clear & equal bilaterally, No wheezes.  Speaking full sentences with ease, Normal respiratory effort/excursion; Chest: Nontender, Movement normal; Abdomen: Soft, Nontender, Nondistended, Normal bowel sounds; Genitourinary: No CVA tenderness; Spine:  No midline CS, TS, LS tenderness.;; Extremities: Pulses normal, Pelvis stable. Pt is able to lift extended bilateral LE's off stretcher without difficulty. No deformity. No edema, No calf edema or asymmetry.; Neuro: AA&Ox3, Major CN grossly intact. No facial droop. Speech clear. No gross focal motor or sensory deficits in extremities.; Skin: Color normal, Warm, Dry.   ED Treatments / Results  Labs (all labs ordered are listed, but only abnormal results are displayed)   EKG  EKG Interpretation None       Radiology   Procedures Procedures (including critical care time)  Medications Ordered in ED Medications - No data to display   Initial Impression / Assessment and Plan / ED Course  I have reviewed the triage vital signs and the nursing notes.  Pertinent labs & imaging results that were available during my care of the patient were reviewed by me and considered in my medical decision making (see chart for details).  MDM Reviewed: previous chart, nursing note and vitals Interpretation: x-ray and CT scan   Dg Lumbar Spine Complete Result Date: 03/22/2017 CLINICAL DATA:  Pain following fall EXAM: LUMBAR SPINE - COMPLETE 4+ VIEW COMPARISON:  None. FINDINGS: Frontal, lateral, spot lumbosacral lateral, and bilateral oblique views were obtained. There are 5 non-rib-bearing lumbar type vertebral bodies. There is mild lumbar dextroscoliosis. There is no fracture. There is 2 mm of retrolisthesis of L2 on L3. There is 3 mm of  retrolisthesis of L3 on L4. No other spondylolisthesis evident. There is moderate disc space narrowing at L2-3. There is mild disc space narrowing at L3-4. There is mild disc space narrowing at L5-S1. There is facet osteoarthritic change at L4-5 and L5-S1 bilaterally. There is aortic atherosclerosis. IMPRESSION: Mild scoliosis. Osteoarthritic change at several levels. Areas of mild spondylolisthesis are felt to be due to underlying spondylosis. No fracture evident. There  is aortic atherosclerosis. Aortic Atherosclerosis (ICD10-I70.0). Electronically Signed   By: Lowella Grip III M.D.   On: 03/22/2017 09:18   Ct Head Wo Contrast Result Date: 03/22/2017 CLINICAL DATA:  76 year old female status post fall, slipped on ice. Headache and posterior neck pain. EXAM: CT HEAD WITHOUT CONTRAST CT CERVICAL SPINE WITHOUT CONTRAST TECHNIQUE: Multidetector CT imaging of the head and cervical spine was performed following the standard protocol without intravenous contrast. Multiplanar CT image reconstructions of the cervical spine were also generated. COMPARISON:  [facility] and cervical spine CT 01/22/2017, and earlier. FINDINGS: CT HEAD FINDINGS Brain: Stable and normal for age cerebral volume. No midline shift, ventriculomegaly, mass effect, evidence of mass lesion, intracranial hemorrhage or evidence of cortically based acute infarction. Chronic inferior left cerebellar infarct is stable. Patchy bilateral cerebral white matter hypodensity is stable. Vascular: Calcified atherosclerosis at the skull base. No suspicious intracranial vascular hyperdensity. Skull: Hyperostosis, normal variant.  No skull fracture identified. Sinuses/Orbits: Visualized paranasal sinuses and mastoids are stable and well pneumatized. Other: Leftward gaze deviation. Otherwise no acute orbit or scalp soft tissue findings. CT CERVICAL SPINE FINDINGS Alignment: Mild reversal of cervical lordosis, straightening demonstrated in  October. Subtle anterolisthesis at C3-C4 and C4-C5 is stable. Bilateral posterior element alignment is within normal limits. Cervicothoracic junction alignment is within normal limits. Skull base and vertebrae: Visualized skull base is intact. No atlanto-occipital dissociation. No cervical spine fracture. Soft tissues and spinal canal: No prevertebral fluid or swelling. No visible canal hematoma. Stable noncontrast neck soft tissues. Left greater than right carotid calcified atherosclerosis. Disc levels: Chronic disc and endplate degeneration at C5-C6. Mild for age cervical spine degeneration elsewhere. Upper chest: Visible upper thoracic levels appear intact. Negative lung apices. IMPRESSION: 1. No acute traumatic injury identified in the head or cervical spine. 2. Stable non contrast CT appearance of the brain with chronic left cerebellar infarct and bilateral cerebral white matter disease. 3. Mild for age cervical spine degeneration. Electronically Signed   By: Genevie Ann M.D.   On: 03/22/2017 09:31   Ct Cervical Spine Wo Contrast Result Date: 03/22/2017 CLINICAL DATA:  76 year old female status post fall, slipped on ice. Headache and posterior neck pain. EXAM: CT HEAD WITHOUT CONTRAST CT CERVICAL SPINE WITHOUT CONTRAST TECHNIQUE: Multidetector CT imaging of the head and cervical spine was performed following the standard protocol without intravenous contrast. Multiplanar CT image reconstructions of the cervical spine were also generated. COMPARISON:  [facility] and cervical spine CT 01/22/2017, and earlier. FINDINGS: CT HEAD FINDINGS Brain: Stable and normal for age cerebral volume. No midline shift, ventriculomegaly, mass effect, evidence of mass lesion, intracranial hemorrhage or evidence of cortically based acute infarction. Chronic inferior left cerebellar infarct is stable. Patchy bilateral cerebral white matter hypodensity is stable. Vascular: Calcified atherosclerosis at the skull base.  No suspicious intracranial vascular hyperdensity. Skull: Hyperostosis, normal variant.  No skull fracture identified. Sinuses/Orbits: Visualized paranasal sinuses and mastoids are stable and well pneumatized. Other: Leftward gaze deviation. Otherwise no acute orbit or scalp soft tissue findings. CT CERVICAL SPINE FINDINGS Alignment: Mild reversal of cervical lordosis, straightening demonstrated in October. Subtle anterolisthesis at C3-C4 and C4-C5 is stable. Bilateral posterior element alignment is within normal limits. Cervicothoracic junction alignment is within normal limits. Skull base and vertebrae: Visualized skull base is intact. No atlanto-occipital dissociation. No cervical spine fracture. Soft tissues and spinal canal: No prevertebral fluid or swelling. No visible canal hematoma. Stable noncontrast neck soft tissues. Left greater than right carotid calcified  atherosclerosis. Disc levels: Chronic disc and endplate degeneration at C5-C6. Mild for age cervical spine degeneration elsewhere. Upper chest: Visible upper thoracic levels appear intact. Negative lung apices. IMPRESSION: 1. No acute traumatic injury identified in the head or cervical spine. 2. Stable non contrast CT appearance of the brain with chronic left cerebellar infarct and bilateral cerebral white matter disease. 3. Mild for age cervical spine degeneration. Electronically Signed   By: Genevie Ann M.D.   On: 03/22/2017 09:31    1005:  CT/XR reassuring. Tx symptomatically at this time. Dx and testing d/w pt.  Questions answered.  Verb understanding, agreeable to d/c home with outpt f/u.    Final Clinical Impressions(s) / ED Diagnoses   Final diagnoses:  None    ED Discharge Orders    None        Francine Graven, DO 03/24/17 1911

## 2017-03-22 NOTE — ED Notes (Signed)
Patient transported to CT 

## 2017-03-22 NOTE — Discharge Instructions (Signed)
Take over the counter tylenol, as directed on packaging, as needed for discomfort.  Apply moist heat or ice to the area(s) of discomfort, for 15 minutes at a time, several times per day for the next few days.  Do not fall asleep on a heating or ice pack.  Call your regular medical doctor on Wednesday to schedule a follow up appointment this week.  Return to the Emergency Department immediately if worsening.

## 2017-03-22 NOTE — ED Triage Notes (Addendum)
Pt reports tripped and fell this morning while assisting husband down a ramp at their home. Pt denies loc but reports headache,lower back pain. Pt reports is on plavix.  Pt HR in triage 48. Pt denies chest pain, shortness of breath.

## 2017-04-12 DIAGNOSIS — I10 Essential (primary) hypertension: Secondary | ICD-10-CM | POA: Diagnosis not present

## 2017-04-12 DIAGNOSIS — D519 Vitamin B12 deficiency anemia, unspecified: Secondary | ICD-10-CM | POA: Diagnosis not present

## 2017-04-12 DIAGNOSIS — K219 Gastro-esophageal reflux disease without esophagitis: Secondary | ICD-10-CM | POA: Diagnosis not present

## 2017-04-12 DIAGNOSIS — M10079 Idiopathic gout, unspecified ankle and foot: Secondary | ICD-10-CM | POA: Diagnosis not present

## 2017-04-12 DIAGNOSIS — Z0001 Encounter for general adult medical examination with abnormal findings: Secondary | ICD-10-CM | POA: Diagnosis not present

## 2017-04-12 DIAGNOSIS — E039 Hypothyroidism, unspecified: Secondary | ICD-10-CM | POA: Diagnosis not present

## 2017-04-12 DIAGNOSIS — I259 Chronic ischemic heart disease, unspecified: Secondary | ICD-10-CM | POA: Diagnosis not present

## 2017-04-12 DIAGNOSIS — B001 Herpesviral vesicular dermatitis: Secondary | ICD-10-CM | POA: Diagnosis not present

## 2017-04-12 DIAGNOSIS — N183 Chronic kidney disease, stage 3 (moderate): Secondary | ICD-10-CM | POA: Diagnosis not present

## 2017-04-12 DIAGNOSIS — E7800 Pure hypercholesterolemia, unspecified: Secondary | ICD-10-CM | POA: Diagnosis not present

## 2017-04-12 DIAGNOSIS — F3289 Other specified depressive episodes: Secondary | ICD-10-CM | POA: Diagnosis not present

## 2017-04-12 DIAGNOSIS — M129 Arthropathy, unspecified: Secondary | ICD-10-CM | POA: Diagnosis not present

## 2017-04-13 DIAGNOSIS — M75122 Complete rotator cuff tear or rupture of left shoulder, not specified as traumatic: Secondary | ICD-10-CM | POA: Diagnosis not present

## 2017-05-13 ENCOUNTER — Ambulatory Visit (INDEPENDENT_AMBULATORY_CARE_PROVIDER_SITE_OTHER): Payer: Medicare Other | Admitting: Cardiovascular Disease

## 2017-05-13 ENCOUNTER — Encounter: Payer: Self-pay | Admitting: Cardiovascular Disease

## 2017-05-13 VITALS — BP 132/74 | HR 55 | Ht 62.0 in | Wt 193.0 lb

## 2017-05-13 DIAGNOSIS — Z8673 Personal history of transient ischemic attack (TIA), and cerebral infarction without residual deficits: Secondary | ICD-10-CM

## 2017-05-13 DIAGNOSIS — I25118 Atherosclerotic heart disease of native coronary artery with other forms of angina pectoris: Secondary | ICD-10-CM

## 2017-05-13 DIAGNOSIS — E785 Hyperlipidemia, unspecified: Secondary | ICD-10-CM | POA: Diagnosis not present

## 2017-05-13 DIAGNOSIS — Z01818 Encounter for other preprocedural examination: Secondary | ICD-10-CM | POA: Diagnosis not present

## 2017-05-13 DIAGNOSIS — Z955 Presence of coronary angioplasty implant and graft: Secondary | ICD-10-CM

## 2017-05-13 DIAGNOSIS — I10 Essential (primary) hypertension: Secondary | ICD-10-CM

## 2017-05-13 MED ORDER — METOPROLOL TARTRATE 25 MG PO TABS: 12.5000 mg | ORAL_TABLET | Freq: Two times a day (BID) | ORAL | 0 refills | Status: DC

## 2017-05-13 NOTE — Patient Instructions (Signed)
Your physician wants you to follow-up in:  1 year with Dr.Koneswaran You will receive a reminder letter in the mail two months in advance. If you don't receive a letter, please call our office to schedule the follow-up appointment.    DECREASE Metoprolol to 12. 5 mg twice a day    No tests or lab work today     If you need a refill on your cardiac medications before your next appointment, please call your pharmacy.      Thank you for choosing [facility] !

## 2017-05-13 NOTE — Progress Notes (Signed)
SUBJECTIVE: The patient presents for overdue follow-up.  I last evaluated her over 2 years ago on 02/07/2015.  I have seen her more recently when I evaluated her husband in the office.    She has a history of coronary artery disease.  Most recent coronary angiogram was performed on 01/22/15. This demonstrated a widely patent RCA stent with mild nonobstructive coronary artery disease with lesions of 20% and 30% in the LAD and RCA. Left ventricular systolic function was normal as was LVEDP. There were normal filling pressures and no evidence of pulmonary hypertension with right heart catheterization.  ECG performed today which I personally reviewed demonstrated sinus bradycardia, 48 bpm, with sinus arrhythmia and late R wave transition.  The patient denies any symptoms of chest pain, palpitations, shortness of breath, leg swelling, orthopnea, PND, and syncope.  She needs to undergo left rotator cuff repair and plans to in the near future with Dr. Noemi Chapel.  She slipped and fell in the snow and hit the back of her head in December 2018.  I reviewed documentation from her ED presentation on 03/22/17.  Head CT showed no acute traumatic injury in the head or cervical spine.  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   Review of Systems: As per "subjective", otherwise negative.  Allergies  Allergen Reactions  . Ace Inhibitors Cough  . Gemfibrozil Other (See Comments)    Muscle Aches  . Statins     REACTION: Muscle Cramps  . Moxifloxacin Rash  . Ranexa [Ranolazine] Other (See Comments)    "NIGHTMARES"    Current Outpatient Medications  Medication Sig Dispense Refill  . acyclovir (ZOVIRAX) 400 MG tablet Take 400 mg by mouth every morning.     Marland Kitchen albuterol (PROVENTIL HFA) 108 (90 BASE) MCG/ACT inhaler Inhale 1 puff into the lungs every 6 (six) hours as needed for shortness of breath. 1 Inhaler 1  . amLODipine (NORVASC) 10 MG tablet Take  10 mg by mouth every morning.  3  . aspirin EC 81 MG tablet Take 81 mg by mouth daily.    . betamethasone valerate (VALISONE) 0.1 % cream Apply 1 application topically daily as needed (for lichen scheleros).     . Bisacodyl (LAXATIVE PO) Take 1 tablet by mouth daily as needed (Constipation).    . cetirizine (ZYRTEC) 10 MG tablet Take 10 mg by mouth daily.    . citalopram (CELEXA) 20 MG tablet Take 20 mg by mouth every morning.     . clopidogrel (PLAVIX) 75 MG tablet Take 75 mg by mouth every morning.     . cyclobenzaprine (FLEXERIL) 5 MG tablet Take 5 mg by mouth 3 (three) times daily as needed for muscle spasms.    Marland Kitchen docusate sodium (COLACE) 100 MG capsule 1 tab 2 times a day while on narcotics.  STOOL SOFTENER 60 capsule 0  . isosorbide mononitrate (IMDUR) 30 MG 24 hr tablet Take 1 tablet (30 mg total) by mouth 2 (two) times daily. 60 tablet 6  . levothyroxine (SYNTHROID, LEVOTHROID) 88 MCG tablet Take 88 mcg by mouth every morning.     . metoprolol tartrate (LOPRESSOR) 25 MG tablet Take 1 tablet (25 mg total) by mouth 2 (two) times daily. 60 tablet 0  . omeprazole (PRILOSEC) 20 MG capsule Take 20 mg by mouth every morning.     Marland Kitchen oxyCODONE (OXY IR/ROXICODONE) 5 MG immediate release tablet 1-2 tablets every 4-6 hrs as needed for pain 100  tablet 0  . polyethylene glycol (MIRALAX / GLYCOLAX) packet 17grams in 16 oz of water twice a day until bowel movement.  LAXITIVE.  Restart if two days since last bowel movement (Patient taking differently: Take 17 g by mouth daily as needed for mild constipation. 17grams in 16 oz of water twice a day until bowel movement.  LAXITIVE.  Restart if two days since last bowel movement) 60 each 0  . vitamin B-12 (CYANOCOBALAMIN) 1000 MCG tablet Take 1,000 mcg by mouth daily.     No current facility-administered medications for this visit.     Past Medical History:  Diagnosis Date  . Anxiety   . Back pain   . Cerebrovascular disease    NONOBSTRUCTIVE CVA BY  CAROTID DOPPLERS OCTOBER 2008  . Chronic renal insufficiency    GFR 45 ML'S PER MINUTE  . COPD (chronic obstructive pulmonary disease) ([place])   . Coronary artery disease    S/P CYPHER DRUG-ELUTING STENTS TO RIGHT CORONARY ARTERY JUNE 2007  . Coronary atherosclerosis of native coronary artery    NEGATIVE LEXISCAN FOR ISCHEMIA MARCH 23,2010 NORMAL LV FUNCTTION  . Depression   . Hyperlipidemia   . Hypertension   . Hypothyroidism   . Other and unspecified angina pectoris    ANGINA  . Overweight(278.02)    OBESITY  . Peptic ulcer disease   . Primary localized osteoarthritis of left knee   . Sleep apnea    SEVERE OBSTRUCTIVE    cpap  . Stroke ([place])   . Thyroid disease    TREATED HYPOTHYROIDISM    Past Surgical History:  Procedure Laterality Date  . ABDOMINAL HYSTERECTOMY    . CARDIAC CATHETERIZATION N/A 01/22/2015   Procedure: Right/Left Heart Cath and Coronary Angiography;  Surgeon: Wellington Hampshire, MD;  Location: [facility];  Service: Cardiovascular;  Laterality: N/A;  . CARPAL TUNNEL RELEASE    . CHOLECYSTECTOMY    . JOINT REPLACEMENT    . 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 ROTATOR CUFF REPAIR Right 07/08/2009  . TOTAL ABDOMINAL HYSTERECTOMY W/ BILATERAL SALPINGOOPHORECTOMY    . TOTAL KNEE ARTHROPLASTY Left 04/08/2015   Procedure: LEFT TOTAL KNEE ARTHROPLASTY;  Surgeon: Elsie Saas, MD;  Location: [facility];  Service: Orthopedics;  Laterality: Left;    Social History   Socioeconomic History  . Marital status: Married    Spouse name: Not on file  . Number of children: Not on file  . Years of education: Not on file  . Highest education level: Not on file  Social Needs  . Financial resource strain: Not on file  . Food insecurity - worry: Not on file  . Food insecurity - inability: Not on file  . Transportation needs - medical: Not on file  . Transportation needs - non-medical: Not on file  Occupational History  . Not on file  Tobacco Use  . Smoking status:  Never Smoker  . Smokeless tobacco: Never Used  Substance and Sexual Activity  . Alcohol use: No    Alcohol/week: 0.0 oz  . Drug use: No  . Sexual activity: No  Other Topics Concern  . Not on file  Social History Narrative  . Not on file     Vitals:   05/13/17 1515  BP: 132/74  Pulse: (!) 55  SpO2: 96%  Weight: 193 lb (87.5 kg)  Height: 5\' 2"  (1.575 m)    Wt Readings from Last 3 Encounters:  05/13/17 193 lb (87.5 kg)  03/22/17 200 lb (90.7  kg)  05/25/15 170 lb (77.1 kg)     PHYSICAL EXAM General: NAD HEENT: Normal. Neck: No JVD, no thyromegaly. Lungs: Clear to auscultation bilaterally with normal respiratory effort. CV: Regular rate and rhythm, normal S1/S2, no S3/S4, no murmur. No pretibial or periankle edema.  No carotid bruit.   Abdomen: Soft, nontender, no distention.  Neurologic: Alert and oriented.  Psych: Normal affect. Skin: Normal. Musculoskeletal: No gross deformities.    ECG: Most recent ECG reviewed.   Labs: Lab Results  Component Value Date/Time   K 3.4 (L) 05/26/2015 01:03 AM   BUN 14 05/26/2015 01:03 AM   CREATININE 1.40 (H) 05/26/2015 01:03 AM   ALT 18 05/25/2015 04:40 PM   TSH 1.122 05/25/2015 04:40 PM   HGB 14.4 05/25/2015 04:40 PM     Lipids: No results found for: LDLCALC, LDLDIRECT, CHOL, TRIG, HDL     ASSESSMENT AND PLAN: 1.  Coronary artery disease with history of RCA stent: Symptomatically stable.  Continue aspirin and Imdur 30 mg twice daily.  I will reduce the dose of metoprolol to 12.5 mg twice daily.  She believes she is only been taking 25 mg every morning.  2.  Hypertension: Blood pressure is controlled.  No changes to therapy.  3.  Hyperlipidemia: I will obtain a copy of lipids from PCP.  She is statin intolerant.  4.  History of CVA: She is on Plavix.  5.  Preoperative risk stratification: I think she can proceed with left rotator cuff repair with an acceptable level of risk.  No noninvasive cardiac testing is  indicated at this time.    Disposition: Follow up 1 year   Kate Sable, M.D., F.A.C.C.

## 2017-05-20 DIAGNOSIS — M75122 Complete rotator cuff tear or rupture of left shoulder, not specified as traumatic: Secondary | ICD-10-CM | POA: Diagnosis not present

## 2017-05-24 NOTE — Pre-Procedure Instructions (Signed)
PAYSLEE BATESON  05/24/2017      [id number] [place] [place] [address] [place] [address] Phone: [phone] Fax: [phone]    Your procedure is scheduled on March 6  Report to [facility].  Call this number if you have problems the morning of surgery:  [phone]   Remember:  Do not eat food or drink liquids after midnight.  Continue all medications as directed by your physician except follow these medication instructions before surgery below   Take these medicines the morning of surgery with A SIP OF WATER  acetaminophen (TYLENOL)  acyclovir (ZOVIRAX) amLODipine (NORVASC)  cetirizine (ZYRTEC)  citalopram (CELEXA) isosorbide mononitrate (IMDUR)  levothyroxine (SYNTHROID, LEVOTHROID) metoprolol tartrate (LOPRESSOR) omeprazole (PRILOSEC)  7 days prior to surgery STOP taking any Aspirin(unless otherwise instructed by your surgeon), Aleve, Naproxen, Ibuprofen, Motrin, Advil, Goody's, BC's, all herbal medications, fish oil, and all vitamins  Follow your doctors instructions regarding your Aspirin.  If no instructions were given by your doctor, then you will need to call the prescribing office office to get instructions.    FOLLOW PHYSICIANS INSTRUCTIONS ABOUT PLAVIX   Do not wear jewelry, make-up or nail polish.  Do not wear lotions, powders, or perfumes, or deodorant.  Do not shave 48 hours prior to surgery.    Do not bring valuables to the hospital.  [facility] is not responsible for any belongings or valuables.  Contacts, dentures or bridgework may not be worn into surgery.  Leave your suitcase in the car.  After surgery it may be brought to your room.  For patients admitted to the hospital, discharge time will be determined by your treatment team.  Patients discharged the day of surgery will not be allowed to drive home.    Special instructions:   [facility]-  Preparing For Surgery  Before surgery, you can play an important role. Because skin is not sterile, your skin needs to be as free of germs as possible. You can reduce the number of germs on your skin by washing with CHG (chlorahexidine gluconate) Soap before surgery.  CHG is an antiseptic cleaner which kills germs and bonds with the skin to continue killing germs even after washing.  Please do not use if you have an allergy to CHG or antibacterial soaps. If your skin becomes reddened/irritated stop using the CHG.  Do not shave (including legs and underarms) for at least 48 hours prior to first CHG shower. It is OK to shave your face.  Please follow these instructions carefully.   1. Shower the NIGHT BEFORE SURGERY and the MORNING OF SURGERY with CHG.   2. If you chose to wash your hair, wash your hair first as usual with your normal shampoo.  3. After you shampoo, rinse your hair and body thoroughly to remove the shampoo.  4. Use CHG as you would any other liquid soap. You can apply CHG directly to the skin and wash gently with a scrungie or a clean washcloth.   5. Apply the CHG Soap to your body ONLY FROM THE NECK DOWN.  Do not use on open wounds or open sores. Avoid contact with your eyes, ears, mouth and genitals (private parts). Wash Face and genitals (private parts)  with your normal soap.  6. Wash thoroughly, paying special attention to the area where your surgery will be performed.  7. Thoroughly rinse  your body with warm water from the neck down.  8. DO NOT shower/wash with your normal soap after using and rinsing off the CHG Soap.  9. Pat yourself dry with a CLEAN TOWEL.  10. Wear CLEAN PAJAMAS to bed the night before surgery, wear comfortable clothes the morning of surgery  11. Place CLEAN SHEETS on your bed the night of your first shower and DO NOT SLEEP WITH PETS.    Day of Surgery: Do not apply any deodorants/lotions. Please wear clean clothes to the hospital/surgery  center.      Please read over the following fact sheets that you were given.

## 2017-05-25 ENCOUNTER — Other Ambulatory Visit: Payer: Self-pay

## 2017-05-25 ENCOUNTER — Encounter (HOSPITAL_COMMUNITY)
Admission: RE | Admit: 2017-05-25 | Discharge: 2017-05-25 | Disposition: A | Discharge status: Home / Self Care | Payer: Medicare Other | Source: Ambulatory Visit | Attending: Orthopaedic Surgery | Admitting: Orthopaedic Surgery

## 2017-05-25 ENCOUNTER — Encounter (HOSPITAL_COMMUNITY): Payer: Self-pay

## 2017-05-25 DIAGNOSIS — Z01818 Encounter for other preprocedural examination: Secondary | ICD-10-CM | POA: Insufficient documentation

## 2017-05-25 HISTORY — DX: Headache: R51

## 2017-05-25 HISTORY — DX: Headache, unspecified: R51.9

## 2017-05-25 HISTORY — DX: Dyspnea, unspecified: R06.00

## 2017-05-25 HISTORY — DX: Gastro-esophageal reflux disease without esophagitis: K21.9

## 2017-05-25 LAB — CBC
HCT: 40.9 % (ref 36.0–46.0)
Hemoglobin: 14.4 g/dL (ref 12.0–15.0)
MCH: 32.1 pg (ref 26.0–34.0)
MCHC: 35.2 g/dL (ref 30.0–36.0)
MCV: 91.1 fL (ref 78.0–100.0)
Platelets: 252 10*3/uL (ref 150–400)
RBC: 4.49 MIL/uL (ref 3.87–5.11)
RDW: 12.3 % (ref 11.5–15.5)
WBC: 7.7 10*3/uL (ref 4.0–10.5)

## 2017-05-25 LAB — BASIC METABOLIC PANEL
Anion gap: 9 (ref 5–15)
BUN: 10 mg/dL (ref 6–20)
CALCIUM: 9.2 mg/dL (ref 8.9–10.3)
CO2: 23 mmol/L (ref 22–32)
Chloride: 104 mmol/L (ref 101–111)
Creatinine, Ser: 1.21 mg/dL — ABNORMAL HIGH (ref 0.44–1.00)
GFR calc Af Amer: 50 mL/min — ABNORMAL LOW (ref >60)
GFR, EST NON AFRICAN AMERICAN: 43 mL/min — AB (ref >60)
GLUCOSE: 88 mg/dL (ref 65–99)
Potassium: 4.1 mmol/L (ref 3.5–5.1)
Sodium: 136 mmol/L (ref 135–145)

## 2017-05-25 LAB — SURGICAL PCR SCREEN
MRSA, PCR: NEGATIVE
STAPHYLOCOCCUS AUREUS: NEGATIVE

## 2017-05-25 NOTE — Progress Notes (Signed)
Dr. Rich Fuchs office called for orders.

## 2017-05-25 NOTE — H&P (Signed)
PREOPERATIVE H&P  Chief Complaint: DJD LEFT SHOULDER  HPI: Annette Sharp is a 77 y.o. female who presents for preoperative history and physical with a diagnosis of DJD LEFT SHOULDER. Symptoms are rated as moderate to severe, and have been worsening.  This is significantly impairing activities of daily living.  She has elected for surgical management.   Past Medical History:  Diagnosis Date  . Anxiety   . Back pain   . Cerebrovascular disease    NONOBSTRUCTIVE CVA BY CAROTID DOPPLERS OCTOBER 2008  . Chronic renal insufficiency    GFR 45 ML'S PER MINUTE  . COPD (chronic obstructive pulmonary disease) ([place])   . Coronary artery disease    S/P CYPHER DRUG-ELUTING STENTS TO RIGHT CORONARY ARTERY JUNE 2007  . Coronary atherosclerosis of native coronary artery    NEGATIVE LEXISCAN FOR ISCHEMIA MARCH 23,2010 NORMAL LV FUNCTTION  . Depression   . Hyperlipidemia   . Hypertension   . Hypothyroidism   . Other and unspecified angina pectoris    ANGINA  . Overweight(278.02)    OBESITY  . Peptic ulcer disease   . Primary localized osteoarthritis of left knee   . Sleep apnea    SEVERE OBSTRUCTIVE    cpap  . Stroke ([place])   . Thyroid disease    TREATED HYPOTHYROIDISM   Past Surgical History:  Procedure Laterality Date  . ABDOMINAL HYSTERECTOMY    . CARDIAC CATHETERIZATION N/A 01/22/2015   Procedure: Right/Left Heart Cath and Coronary Angiography;  Surgeon: Wellington Hampshire, MD;  Location: [facility];  Service: Cardiovascular;  Laterality: N/A;  . CARPAL TUNNEL RELEASE    . CHOLECYSTECTOMY    . JOINT REPLACEMENT    . 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 ROTATOR CUFF REPAIR Right 07/08/2009  . TOTAL ABDOMINAL HYSTERECTOMY W/ BILATERAL SALPINGOOPHORECTOMY    . TOTAL KNEE ARTHROPLASTY Left 04/08/2015   Procedure: LEFT TOTAL KNEE ARTHROPLASTY;  Surgeon: Elsie Saas, MD;  Location: [facility];  Service: Orthopedics;  Laterality: Left;   Social History   Socioeconomic History  .  Marital status: Married    Spouse name: Not on file  . Number of children: Not on file  . Years of education: Not on file  . Highest education level: Not on file  Social Needs  . Financial resource strain: Not on file  . Food insecurity - worry: Not on file  . Food insecurity - inability: Not on file  . Transportation needs - medical: Not on file  . Transportation needs - non-medical: Not on file  Occupational History  . Not on file  Tobacco Use  . Smoking status: Never Smoker  . Smokeless tobacco: Never Used  Substance and Sexual Activity  . Alcohol use: No    Alcohol/week: 0.0 oz  . Drug use: No  . Sexual activity: No  Other Topics Concern  . Not on file  Social History Narrative  . Not on file   Family History  Problem Relation Age of Onset  . CAD Unknown   . Hypertension Unknown   . Heart attack Unknown   . Cancer Unknown   . CAD Mother   . Cancer Mother   . CAD Father   . Diabetes Sister   . CAD Sister   . Leukemia Brother    Allergies  Allergen Reactions  . Ace Inhibitors Cough  . Gemfibrozil Other (See Comments)    Muscle Aches  . Statins Other (See Comments)    Muscle aches.  Marland Kitchen  Moxifloxacin Rash  . Ranexa [Ranolazine] Other (See Comments)    "NIGHTMARES"   Prior to Admission medications   Medication Sig Start Date End Date Taking? Authorizing Provider  acetaminophen (TYLENOL) 500 MG tablet Take 500 mg by mouth every 6 (six) hours as needed (for pain).   Yes [provider]  acyclovir (ZOVIRAX) 400 MG tablet Take 400 mg by mouth every morning.    Yes [provider]  amLODipine (NORVASC) 10 MG tablet Take 10 mg by mouth every morning. 05/04/15  Yes [provider]  aspirin EC 81 MG tablet Take 81 mg by mouth daily.   Yes [provider]  betamethasone valerate (VALISONE) 0.1 % cream Apply 1 application topically daily as needed (for lichen scheleros).    Yes [provider]  cetirizine (ZYRTEC) 10 MG tablet  Take 10 mg by mouth every morning.    Yes [provider]  citalopram (CELEXA) 20 MG tablet Take 20 mg by mouth every morning.    Yes [provider]  clopidogrel (PLAVIX) 75 MG tablet Take 75 mg by mouth every morning.    Yes [provider]  docusate sodium (COLACE) 100 MG capsule 1 tab 2 times a day while on narcotics.  STOOL SOFTENER Patient taking differently: Take 100 mg by mouth 2 (two) times daily as needed (for constipation.).  04/10/15  Yes Shepperson, Kirstin, PA-C  isosorbide mononitrate (IMDUR) 60 MG 24 hr tablet Take 60 mg by mouth 2 (two) times daily.   Yes [provider]  levothyroxine (SYNTHROID, LEVOTHROID) 88 MCG tablet Take 88 mcg by mouth every morning.    Yes [provider]  metoprolol tartrate (LOPRESSOR) 25 MG tablet Take 0.5 tablets (12.5 mg total) by mouth 2 (two) times daily. 05/13/17  Yes Herminio Commons, MD  omeprazole (PRILOSEC) 20 MG capsule Take 20 mg by mouth daily as needed (FOR HEARTBURN/INDIGESTION.).    Yes [provider]  polyethylene glycol (MIRALAX / GLYCOLAX) packet 17grams in 16 oz of water twice a day until bowel movement.  LAXITIVE.  Restart if two days since last bowel movement Patient taking differently: Take 17 g by mouth daily as needed for mild constipation. 17grams in 16 oz of water twice a day until bowel movement.  LAXITIVE.  Restart if two days since last bowel movement 04/10/15  Yes Shepperson, Kirstin, PA-C     Positive ROS: All other systems have been reviewed and were otherwise negative with the exception of those mentioned in the HPI and as above.  Physical Exam: General: Alert, no acute distress Cardiovascular: No pedal edema Respiratory: No cyanosis, no use of accessory musculature GI: No organomegaly, abdomen is soft and non-tender Skin: No lesions in the area of chief complaint Neurologic: Sensation intact distally Psychiatric: Patient is competent for consent with normal  mood and affect Lymphatic: No axillary or cervical lymphadenopathy  MUSCULOSKELETAL: L shoulder: pseudoparalysis, distal motor sensory intact,  Assessment: DJD LEFT SHOULDER  Plan: Plan for Procedure(s): TOTAL SHOULDER ARTHROPLASTY  The risks benefits and alternatives were discussed with the patient including but not limited to the risks of nonoperative treatment, versus surgical intervention including infection, bleeding, nerve injury,  blood clots, cardiopulmonary complications, morbidity, mortality, among others, and they were willing to proceed.   Hiram Gash, MD  05/25/2017 12:39 PM

## 2017-06-01 MED ORDER — SODIUM CHLORIDE 0.9 % IV SOLN
1000.0000 mg | INTRAVENOUS | Status: AC
  Administered 2017-06-02: 1000 mg via INTRAVENOUS
  Filled 2017-06-01: qty 1100

## 2017-06-01 MED ORDER — CEFAZOLIN SODIUM-DEXTROSE 2-4 GM/100ML-% IV SOLN
2.0000 g | INTRAVENOUS | Status: AC
  Administered 2017-06-02: 2 g via INTRAVENOUS
  Filled 2017-06-01: qty 100

## 2017-06-02 ENCOUNTER — Inpatient Hospital Stay (HOSPITAL_COMMUNITY)
Admission: RE | Admit: 2017-06-02 | Discharge: 2017-06-03 | DRG: 483 | Disposition: A | Discharge status: Home / Self Care | Payer: Medicare Other | Source: Ambulatory Visit | Attending: Orthopaedic Surgery | Admitting: Orthopaedic Surgery

## 2017-06-02 ENCOUNTER — Encounter (HOSPITAL_COMMUNITY): Payer: Self-pay | Admitting: *Deleted

## 2017-06-02 ENCOUNTER — Inpatient Hospital Stay (HOSPITAL_COMMUNITY): Payer: Medicare Other | Admitting: Certified Registered Nurse Anesthetist

## 2017-06-02 ENCOUNTER — Inpatient Hospital Stay (HOSPITAL_COMMUNITY): Payer: Medicare Other

## 2017-06-02 ENCOUNTER — Encounter (HOSPITAL_COMMUNITY)
Admission: RE | Disposition: A | Discharge status: Home / Self Care | Payer: Self-pay | Source: Ambulatory Visit | Attending: Orthopaedic Surgery

## 2017-06-02 DIAGNOSIS — N189 Chronic kidney disease, unspecified: Secondary | ICD-10-CM | POA: Diagnosis present

## 2017-06-02 DIAGNOSIS — Z7902 Long term (current) use of antithrombotics/antiplatelets: Secondary | ICD-10-CM | POA: Diagnosis not present

## 2017-06-02 DIAGNOSIS — Z96612 Presence of left artificial shoulder joint: Secondary | ICD-10-CM | POA: Diagnosis not present

## 2017-06-02 DIAGNOSIS — Z7982 Long term (current) use of aspirin: Secondary | ICD-10-CM | POA: Diagnosis not present

## 2017-06-02 DIAGNOSIS — Z96653 Presence of artificial knee joint, bilateral: Secondary | ICD-10-CM | POA: Diagnosis present

## 2017-06-02 DIAGNOSIS — Z955 Presence of coronary angioplasty implant and graft: Secondary | ICD-10-CM

## 2017-06-02 DIAGNOSIS — J449 Chronic obstructive pulmonary disease, unspecified: Secondary | ICD-10-CM | POA: Diagnosis present

## 2017-06-02 DIAGNOSIS — K219 Gastro-esophageal reflux disease without esophagitis: Secondary | ICD-10-CM | POA: Diagnosis present

## 2017-06-02 DIAGNOSIS — Z79890 Hormone replacement therapy (postmenopausal): Secondary | ICD-10-CM | POA: Diagnosis not present

## 2017-06-02 DIAGNOSIS — Z9181 History of falling: Secondary | ICD-10-CM | POA: Diagnosis not present

## 2017-06-02 DIAGNOSIS — Z90710 Acquired absence of both cervix and uterus: Secondary | ICD-10-CM

## 2017-06-02 DIAGNOSIS — Z79899 Other long term (current) drug therapy: Secondary | ICD-10-CM | POA: Diagnosis not present

## 2017-06-02 DIAGNOSIS — E039 Hypothyroidism, unspecified: Secondary | ICD-10-CM | POA: Diagnosis present

## 2017-06-02 DIAGNOSIS — Z888 Allergy status to other drugs, medicaments and biological substances status: Secondary | ICD-10-CM | POA: Diagnosis not present

## 2017-06-02 DIAGNOSIS — M75102 Unspecified rotator cuff tear or rupture of left shoulder, not specified as traumatic: Principal | ICD-10-CM | POA: Diagnosis present

## 2017-06-02 DIAGNOSIS — M19012 Primary osteoarthritis, left shoulder: Secondary | ICD-10-CM | POA: Diagnosis present

## 2017-06-02 DIAGNOSIS — I2510 Atherosclerotic heart disease of native coronary artery without angina pectoris: Secondary | ICD-10-CM | POA: Diagnosis present

## 2017-06-02 DIAGNOSIS — Z8673 Personal history of transient ischemic attack (TIA), and cerebral infarction without residual deficits: Secondary | ICD-10-CM | POA: Diagnosis not present

## 2017-06-02 DIAGNOSIS — G4730 Sleep apnea, unspecified: Secondary | ICD-10-CM | POA: Diagnosis present

## 2017-06-02 DIAGNOSIS — Z471 Aftercare following joint replacement surgery: Secondary | ICD-10-CM | POA: Diagnosis not present

## 2017-06-02 DIAGNOSIS — I129 Hypertensive chronic kidney disease with stage 1 through stage 4 chronic kidney disease, or unspecified chronic kidney disease: Secondary | ICD-10-CM | POA: Diagnosis present

## 2017-06-02 DIAGNOSIS — G8918 Other acute postprocedural pain: Secondary | ICD-10-CM | POA: Diagnosis not present

## 2017-06-02 DIAGNOSIS — Z09 Encounter for follow-up examination after completed treatment for conditions other than malignant neoplasm: Secondary | ICD-10-CM

## 2017-06-02 DIAGNOSIS — M12812 Other specific arthropathies, not elsewhere classified, left shoulder: Secondary | ICD-10-CM | POA: Diagnosis present

## 2017-06-02 HISTORY — PX: TOTAL SHOULDER ARTHROPLASTY: SHX126

## 2017-06-02 SURGERY — ARTHROPLASTY, SHOULDER, TOTAL
Anesthesia: General | Site: Shoulder | Laterality: Left

## 2017-06-02 MED ORDER — METOPROLOL TARTRATE 12.5 MG HALF TABLET
12.5000 mg | ORAL_TABLET | Freq: Two times a day (BID) | ORAL | Status: DC
  Administered 2017-06-02 – 2017-06-03 (×2): 12.5 mg via ORAL
  Filled 2017-06-02 (×2): qty 1

## 2017-06-02 MED ORDER — ONDANSETRON HCL 4 MG/2ML IJ SOLN: 4.0000 mg | Freq: Once | INTRAMUSCULAR | Status: DC | PRN

## 2017-06-02 MED ORDER — MIDAZOLAM HCL 5 MG/5ML IJ SOLN
INTRAMUSCULAR | Status: DC | PRN
  Administered 2017-06-02 (×2): 1 mg via INTRAVENOUS

## 2017-06-02 MED ORDER — ONDANSETRON HCL 4 MG PO TABS: 4.0000 mg | ORAL_TABLET | Freq: Four times a day (QID) | ORAL | Status: DC | PRN

## 2017-06-02 MED ORDER — PANTOPRAZOLE SODIUM 40 MG PO TBEC
40.0000 mg | DELAYED_RELEASE_TABLET | Freq: Every day | ORAL | Status: DC
  Administered 2017-06-02 – 2017-06-03 (×2): 40 mg via ORAL
  Filled 2017-06-02 (×2): qty 1

## 2017-06-02 MED ORDER — DOCUSATE SODIUM 100 MG PO CAPS
100.0000 mg | ORAL_CAPSULE | Freq: Two times a day (BID) | ORAL | Status: DC
  Administered 2017-06-02 – 2017-06-03 (×2): 100 mg via ORAL
  Filled 2017-06-02 (×2): qty 1

## 2017-06-02 MED ORDER — ACETAMINOPHEN 500 MG PO TABS
1000.0000 mg | ORAL_TABLET | Freq: Four times a day (QID) | ORAL | Status: AC
  Administered 2017-06-02 – 2017-06-03 (×4): 1000 mg via ORAL
  Filled 2017-06-02 (×4): qty 2

## 2017-06-02 MED ORDER — METOCLOPRAMIDE HCL 5 MG/ML IJ SOLN: 5.0000 mg | Freq: Three times a day (TID) | INTRAMUSCULAR | Status: DC | PRN

## 2017-06-02 MED ORDER — MEPERIDINE HCL 50 MG/ML IJ SOLN: 6.2500 mg | INTRAMUSCULAR | Status: DC | PRN

## 2017-06-02 MED ORDER — CHLORHEXIDINE GLUCONATE 4 % EX LIQD: 60.0000 mL | Freq: Once | CUTANEOUS | Status: DC

## 2017-06-02 MED ORDER — HYDROMORPHONE HCL 1 MG/ML IJ SOLN: 0.2500 mg | INTRAMUSCULAR | Status: DC | PRN

## 2017-06-02 MED ORDER — AMLODIPINE BESYLATE 10 MG PO TABS
10.0000 mg | ORAL_TABLET | Freq: Every morning | ORAL | Status: DC
  Administered 2017-06-03: 10 mg via ORAL
  Filled 2017-06-02: qty 1

## 2017-06-02 MED ORDER — BUPIVACAINE-EPINEPHRINE (PF) 0.5% -1:200000 IJ SOLN
INTRAMUSCULAR | Status: DC | PRN
  Administered 2017-06-02: 30 mL via PERINEURAL

## 2017-06-02 MED ORDER — ACYCLOVIR 400 MG PO TABS
400.0000 mg | ORAL_TABLET | Freq: Every morning | ORAL | Status: DC
  Administered 2017-06-03: 400 mg via ORAL
  Filled 2017-06-02: qty 1

## 2017-06-02 MED ORDER — LACTATED RINGERS IV SOLN
INTRAVENOUS | Status: DC | PRN
  Administered 2017-06-02: 08:00:00 via INTRAVENOUS

## 2017-06-02 MED ORDER — CEFAZOLIN SODIUM-DEXTROSE 1-4 GM/50ML-% IV SOLN
1.0000 g | Freq: Four times a day (QID) | INTRAVENOUS | Status: AC
  Administered 2017-06-02 – 2017-06-03 (×3): 1 g via INTRAVENOUS
  Filled 2017-06-02 (×4): qty 50

## 2017-06-02 MED ORDER — DIPHENHYDRAMINE HCL 12.5 MG/5ML PO ELIX: 12.5000 mg | ORAL_SOLUTION | ORAL | Status: DC | PRN

## 2017-06-02 MED ORDER — LIDOCAINE HCL (CARDIAC) 20 MG/ML IV SOLN
INTRAVENOUS | Status: DC | PRN
  Administered 2017-06-02: 80 mg via INTRAVENOUS

## 2017-06-02 MED ORDER — 0.9 % SODIUM CHLORIDE (POUR BTL) OPTIME
TOPICAL | Status: DC | PRN
  Administered 2017-06-02: 1000 mL

## 2017-06-02 MED ORDER — MORPHINE SULFATE (PF) 2 MG/ML IV SOLN: 0.5000 mg | INTRAVENOUS | Status: DC | PRN

## 2017-06-02 MED ORDER — GLYCOPYRROLATE 0.2 MG/ML IJ SOLN
INTRAMUSCULAR | Status: DC | PRN
  Administered 2017-06-02: 0.2 mg via INTRAVENOUS

## 2017-06-02 MED ORDER — METOCLOPRAMIDE HCL 5 MG PO TABS: 5.0000 mg | ORAL_TABLET | Freq: Three times a day (TID) | ORAL | Status: DC | PRN

## 2017-06-02 MED ORDER — ONDANSETRON HCL 4 MG/2ML IJ SOLN: 4.0000 mg | Freq: Four times a day (QID) | INTRAMUSCULAR | Status: DC | PRN

## 2017-06-02 MED ORDER — PROPOFOL 10 MG/ML IV BOLUS
INTRAVENOUS | Status: AC
  Filled 2017-06-02: qty 20

## 2017-06-02 MED ORDER — FENTANYL CITRATE (PF) 250 MCG/5ML IJ SOLN
INTRAMUSCULAR | Status: AC
  Filled 2017-06-02: qty 5

## 2017-06-02 MED ORDER — SODIUM CHLORIDE 0.9 % IR SOLN
Status: DC | PRN
  Administered 2017-06-02: 3000 mL

## 2017-06-02 MED ORDER — PHENYLEPHRINE HCL 10 MG/ML IJ SOLN
INTRAVENOUS | Status: DC | PRN
  Administered 2017-06-02: 40 ug/min via INTRAVENOUS

## 2017-06-02 MED ORDER — ZOLPIDEM TARTRATE 5 MG PO TABS
5.0000 mg | ORAL_TABLET | Freq: Every evening | ORAL | Status: DC | PRN
Start: 2017-06-02 — End: 2017-06-03

## 2017-06-02 MED ORDER — CELECOXIB 200 MG PO CAPS
200.0000 mg | ORAL_CAPSULE | Freq: Two times a day (BID) | ORAL | Status: DC
  Administered 2017-06-02 – 2017-06-03 (×2): 200 mg via ORAL
  Filled 2017-06-02 (×2): qty 1

## 2017-06-02 MED ORDER — PHENYLEPHRINE HCL 10 MG/ML IJ SOLN
INTRAMUSCULAR | Status: DC | PRN
  Administered 2017-06-02: 80 ug via INTRAVENOUS

## 2017-06-02 MED ORDER — CLOPIDOGREL BISULFATE 75 MG PO TABS
75.0000 mg | ORAL_TABLET | Freq: Every morning | ORAL | Status: DC
  Administered 2017-06-03: 75 mg via ORAL
  Filled 2017-06-02: qty 1

## 2017-06-02 MED ORDER — DEXAMETHASONE SODIUM PHOSPHATE 4 MG/ML IJ SOLN
INTRAMUSCULAR | Status: DC | PRN
  Administered 2017-06-02: 10 mg via INTRAVENOUS

## 2017-06-02 MED ORDER — OXYCODONE HCL 5 MG PO TABS
5.0000 mg | ORAL_TABLET | Freq: Four times a day (QID) | ORAL | Status: DC | PRN
  Administered 2017-06-02 – 2017-06-03 (×2): 5 mg via ORAL
  Administered 2017-06-03: 10 mg via ORAL
  Filled 2017-06-02: qty 2
  Filled 2017-06-02 (×2): qty 1

## 2017-06-02 MED ORDER — EPHEDRINE SULFATE 50 MG/ML IJ SOLN
INTRAMUSCULAR | Status: DC | PRN
  Administered 2017-06-02 (×2): 10 mg via INTRAVENOUS

## 2017-06-02 MED ORDER — MIDAZOLAM HCL 2 MG/2ML IJ SOLN
INTRAMUSCULAR | Status: AC
  Filled 2017-06-02: qty 2

## 2017-06-02 MED ORDER — BUPIVACAINE-EPINEPHRINE (PF) 0.5% -1:200000 IJ SOLN
INTRAMUSCULAR | Status: AC
  Filled 2017-06-02: qty 30

## 2017-06-02 MED ORDER — PROPOFOL 10 MG/ML IV BOLUS
INTRAVENOUS | Status: DC | PRN
  Administered 2017-06-02: 80 mg via INTRAVENOUS

## 2017-06-02 MED ORDER — ROCURONIUM BROMIDE 100 MG/10ML IV SOLN
INTRAVENOUS | Status: DC | PRN
  Administered 2017-06-02: 30 mg via INTRAVENOUS
  Administered 2017-06-02: 50 mg via INTRAVENOUS

## 2017-06-02 MED ORDER — FENTANYL CITRATE (PF) 100 MCG/2ML IJ SOLN
INTRAMUSCULAR | Status: DC | PRN
  Administered 2017-06-02: 100 ug via INTRAVENOUS

## 2017-06-02 MED ORDER — ASPIRIN EC 81 MG PO TBEC
81.0000 mg | DELAYED_RELEASE_TABLET | Freq: Every day | ORAL | Status: DC
  Administered 2017-06-02 – 2017-06-03 (×2): 81 mg via ORAL
  Filled 2017-06-02 (×2): qty 1

## 2017-06-02 MED ORDER — CITALOPRAM HYDROBROMIDE 20 MG PO TABS
20.0000 mg | ORAL_TABLET | Freq: Every morning | ORAL | Status: DC
  Administered 2017-06-03: 20 mg via ORAL
  Filled 2017-06-02: qty 1

## 2017-06-02 MED ORDER — LEVOTHYROXINE SODIUM 88 MCG PO TABS
88.0000 ug | ORAL_TABLET | Freq: Every day | ORAL | Status: DC
  Administered 2017-06-03: 88 ug via ORAL
  Filled 2017-06-02: qty 1

## 2017-06-02 MED ORDER — ISOSORBIDE MONONITRATE ER 60 MG PO TB24
60.0000 mg | ORAL_TABLET | Freq: Two times a day (BID) | ORAL | Status: DC
  Administered 2017-06-02 – 2017-06-03 (×2): 60 mg via ORAL
  Filled 2017-06-02 (×2): qty 1

## 2017-06-02 MED ORDER — ONDANSETRON HCL 4 MG/2ML IJ SOLN
INTRAMUSCULAR | Status: DC | PRN
  Administered 2017-06-02: 4 mg via INTRAVENOUS

## 2017-06-02 MED ORDER — SUGAMMADEX SODIUM 200 MG/2ML IV SOLN
INTRAVENOUS | Status: DC | PRN
  Administered 2017-06-02: 400 mg via INTRAVENOUS

## 2017-06-02 SURGICAL SUPPLY — 79 items
AID PSTN UNV HD RSTRNT DISP (MISCELLANEOUS) ×1
APL SKNCLS STERI-STRIP NONHPOA (GAUZE/BANDAGES/DRESSINGS) ×1
BASEPLATE GLENOSPHERE 25 STD (Miscellaneous) ×1 IMPLANT
BASEPLATE GLENOSPHERE 25MM STD (Miscellaneous) ×1 IMPLANT
BENZOIN TINCTURE PRP APPL 2/3 (GAUZE/BANDAGES/DRESSINGS) ×3 IMPLANT
BIT DRILL 3.2 PERIPHERAL SCREW (BIT) ×2 IMPLANT
BLADE SAW SAG 29X58X.64 (BLADE) IMPLANT
BLADE SAW SAG 73X25 THK (BLADE) ×2
BLADE SAW SGTL 73X25 THK (BLADE) IMPLANT
BONE SCREW THREAD 6.5X35MM (Screw) ×1 IMPLANT
BSPLAT GLND STD 25 RVRS SHLDR (Miscellaneous) ×1 IMPLANT
CHLORAPREP W/TINT 26ML (MISCELLANEOUS) ×4 IMPLANT
CLOSURE STERI-STRIP 1/2X4 (GAUZE/BANDAGES/DRESSINGS) ×1
CLOSURE WOUND 1/2 X4 (GAUZE/BANDAGES/DRESSINGS) ×1
CLSR STERI-STRIP ANTIMIC 1/2X4 (GAUZE/BANDAGES/DRESSINGS) ×1 IMPLANT
COVER SURGICAL LIGHT HANDLE (MISCELLANEOUS) ×3 IMPLANT
DRAPE INCISE IOBAN 66X45 STRL (DRAPES) ×4 IMPLANT
DRAPE ORTHO SPLIT 77X108 STRL (DRAPES) ×6
DRAPE PROXIMA HALF (DRAPES) ×3 IMPLANT
DRAPE SURG ORHT 6 SPLT 77X108 (DRAPES) ×2 IMPLANT
DRAPE SWITCH (DRAPES) ×3 IMPLANT
DRSG AQUACEL AG ADV 3.5X 6 (GAUZE/BANDAGES/DRESSINGS) ×3 IMPLANT
ELECT BLADE 4.0 EZ CLEAN MEGAD (MISCELLANEOUS) ×3
ELECT CAUTERY BLADE 6.4 (BLADE) ×3 IMPLANT
ELECT REM PT RETURN 9FT ADLT (ELECTROSURGICAL) ×3
ELECTRODE BLDE 4.0 EZ CLN MEGD (MISCELLANEOUS) ×1 IMPLANT
ELECTRODE REM PT RTRN 9FT ADLT (ELECTROSURGICAL) ×1 IMPLANT
GLENOIDSPHERE LATERALIZED 33 (Joint) ×3 IMPLANT
GLOVE BIOGEL PI IND STRL 8 (GLOVE) ×1 IMPLANT
GLOVE BIOGEL PI INDICATOR 8 (GLOVE) ×4
GLOVE ECLIPSE 8.0 STRL XLNG CF (GLOVE) ×8 IMPLANT
GOWN STRL REUS W/ TWL LRG LVL3 (GOWN DISPOSABLE) ×1 IMPLANT
GOWN STRL REUS W/ TWL XL LVL3 (GOWN DISPOSABLE) ×1 IMPLANT
GOWN STRL REUS W/TWL LRG LVL3 (GOWN DISPOSABLE) ×3
GOWN STRL REUS W/TWL XL LVL3 (GOWN DISPOSABLE) ×6
GUIDEWIRE GLENOID 2.5X220 (WIRE) ×2 IMPLANT
HANDPIECE INTERPULSE COAX TIP (DISPOSABLE) ×3
IMPL REVERSE SHOULDER 0X3.5 (Shoulder) IMPLANT
IMPLANT REVERSE SHOULDER 0X3.5 (Shoulder) ×3 IMPLANT
KIT BASIN OR (CUSTOM PROCEDURE TRAY) ×3 IMPLANT
KIT ROOM TURNOVER OR (KITS) ×3 IMPLANT
KIT STABILIZATION SHOULDER (MISCELLANEOUS) ×3 IMPLANT
MANIFOLD NEPTUNE II (INSTRUMENTS) ×3 IMPLANT
NDL HYPO 25GX1X1/2 BEV (NEEDLE) IMPLANT
NDL MAYO TROCAR (NEEDLE) ×1 IMPLANT
NEEDLE HYPO 25GX1X1/2 BEV (NEEDLE) IMPLANT
NEEDLE MAYO TROCAR (NEEDLE) ×3 IMPLANT
NS IRRIG 1000ML POUR BTL (IV SOLUTION) ×3 IMPLANT
PACK SHOULDER (CUSTOM PROCEDURE TRAY) ×3 IMPLANT
PAD ARMBOARD 7.5X6 YLW CONV (MISCELLANEOUS) ×4 IMPLANT
RESTRAINT HEAD UNIVERSAL NS (MISCELLANEOUS) ×3 IMPLANT
SCREW BONE THREAD 6.5X35 (Screw) ×1 IMPLANT
SCREW PERIPHERAL 30 (Screw) ×2 IMPLANT
SCREW PERIPHERAL 42 (Screw) ×2 IMPLANT
SET HNDPC FAN SPRY TIP SCT (DISPOSABLE) IMPLANT
SHOULDER SYSTEM 33 OD 12.5DEG (Joint) ×3 IMPLANT
SLING ARM IMMOBILIZER LRG (SOFTGOODS) ×2 IMPLANT
SMARTMIX MINI TOWER (MISCELLANEOUS)
SPHERE GLENOID LATERALIZED 33 (Joint) IMPLANT
SPONGE LAP 18X18 X RAY DECT (DISPOSABLE) ×3 IMPLANT
STEM HUMERAL SZ2B STND 70 PTC (Stem) ×3 IMPLANT
STEM HUMERAL SZ2BSTD 70 PTC (Stem) IMPLANT
STRIP CLOSURE SKIN 1/2X4 (GAUZE/BANDAGES/DRESSINGS) ×2 IMPLANT
SUCTION FRAZIER HANDLE 10FR (MISCELLANEOUS) ×2
SUCTION TUBE FRAZIER 10FR DISP (MISCELLANEOUS) ×1 IMPLANT
SUT ETHIBOND 2 V 37 (SUTURE) ×3 IMPLANT
SUT ETHIBOND NAB CT1 #1 30IN (SUTURE) ×3 IMPLANT
SUT FIBERWIRE #5 38 CONV NDL (SUTURE) ×18
SUT MON AB 3-0 SH 27 (SUTURE) ×3
SUT MON AB 3-0 SH27 (SUTURE) ×1 IMPLANT
SUT VIC AB 0 CT1 18XCR BRD 8 (SUTURE) ×1 IMPLANT
SUT VIC AB 0 CT1 8-18 (SUTURE) ×3
SUT VIC AB 2-0 CT1 27 (SUTURE) ×3
SUT VIC AB 2-0 CT1 TAPERPNT 27 (SUTURE) ×1 IMPLANT
SUTURE FIBERWR #5 38 CONV NDL (SUTURE) ×6 IMPLANT
SYSTEM SHOULDER 33 OD 12.5DEG (Joint) IMPLANT
TOWEL OR 17X26 10 PK STRL BLUE (TOWEL DISPOSABLE) ×3 IMPLANT
TOWER CARTRIDGE SMART MIX (DISPOSABLE) IMPLANT
TOWER SMARTMIX MINI (MISCELLANEOUS) IMPLANT

## 2017-06-02 NOTE — Anesthesia Procedure Notes (Signed)
Anesthesia Regional Block: Interscalene brachial plexus block   Pre-Anesthetic Checklist: ,, timeout performed, Correct Patient, Correct Site, Correct Laterality, Correct Procedure, Correct Position, site marked, Risks and benefits discussed,  Surgical consent,  Pre-op evaluation,  At surgeon's request and post-op pain management  Laterality: Left  Prep: chloraprep       Needles:  Injection technique: Single-shot  Needle Type: Echogenic Stimulator Needle     Needle Length: 5cm  Needle Gauge: 21     Additional Needles:   Procedures:, nerve stimulator,,,,,,,   Nerve Stimulator or Paresthesia:  Response: 0.4 mA,   Additional Responses:   Narrative:  Start time: 06/02/2017 8:10 AM End time: 06/02/2017 8:20 AM Injection made incrementally with aspirations every 5 mL.  Performed by: Personally  Anesthesiologist: Lillia Abed, MD  Additional Notes: Monitors applied. Patient sedated. Sterile prep and drape,hand hygiene and sterile gloves were used. Relevant anatomy identified.Needle position confirmed.Local anesthetic injected incrementally after negative aspiration. Local anesthetic spread visualized around nerve(s). Vascular puncture avoided. No complications. Image printed for medical record.The patient tolerated the procedure well.

## 2017-06-02 NOTE — Plan of Care (Signed)
  Progressing Education: Knowledge of General Education information will improve 06/02/2017 1635 - Progressing by Rance Muir, RN Health Behavior/Discharge Planning: Ability to manage health-related needs will improve 06/02/2017 1635 - Progressing by Rance Muir, RN Clinical Measurements: Ability to maintain clinical measurements within normal limits will improve 06/02/2017 1635 - Progressing by Rance Muir, RN Will remain free from infection 06/02/2017 1635 - Progressing by Rance Muir, RN Diagnostic test results will improve 06/02/2017 1635 - Progressing by Rance Muir, RN Respiratory complications will improve 06/02/2017 1635 - Progressing by Rance Muir, RN Cardiovascular complication will be avoided 06/02/2017 1635 - Progressing by Rance Muir, RN Activity: Risk for activity intolerance will decrease 06/02/2017 1635 - Progressing by Rance Muir, RN Nutrition: Adequate nutrition will be maintained 06/02/2017 1635 - Progressing by Rance Muir, RN Coping: Level of anxiety will decrease 06/02/2017 1635 - Progressing by Rance Muir, RN Elimination: Will not experience complications related to bowel motility 06/02/2017 1635 - Progressing by Rance Muir, RN Will not experience complications related to urinary retention 06/02/2017 1635 - Progressing by Rance Muir, RN Pain Managment: General experience of comfort will improve 06/02/2017 1635 - Progressing by Rance Muir, RN Safety: Ability to remain free from injury will improve 06/02/2017 1635 - Progressing by Rance Muir, RN Skin Integrity: Risk for impaired skin integrity will decrease 06/02/2017 1635 - Progressing by Rance Muir, RN

## 2017-06-02 NOTE — Anesthesia Preprocedure Evaluation (Signed)
Anesthesia Evaluation  Patient identified by MRN, date of birth, ID band Patient awake    Reviewed: Allergy & Precautions, NPO status , Patient's Chart, lab work & pertinent test results  Airway Mallampati: I  TM Distance: >3 FB Neck ROM: Full    Dental   Pulmonary sleep apnea , COPD,    Pulmonary exam normal        Cardiovascular hypertension, Pt. on medications + CAD and + Cardiac Stents  Normal cardiovascular exam     Neuro/Psych Anxiety Depression CVA    GI/Hepatic GERD  Medicated and Controlled,  Endo/Other    Renal/GU      Musculoskeletal   Abdominal   Peds  Hematology   Anesthesia Other Findings   Reproductive/Obstetrics                             Anesthesia Physical Anesthesia Plan  ASA: III  Anesthesia Plan: General   Post-op Pain Management:  Regional for Post-op pain   Induction: Intravenous  PONV Risk Score and Plan: 3 and Ondansetron and Treatment may vary due to age or medical condition  Airway Management Planned: Oral ETT  Additional Equipment:   Intra-op Plan:   Post-operative Plan: Extubation in OR  Informed Consent: I have reviewed the patients History and Physical, chart, labs and discussed the procedure including the risks, benefits and alternatives for the proposed anesthesia with the patient or authorized representative who has indicated his/her understanding and acceptance.     Plan Discussed with: CRNA and Surgeon  Anesthesia Plan Comments:         Anesthesia Quick Evaluation

## 2017-06-02 NOTE — Op Note (Signed)
Orthopaedic Surgery Operative Note (CSN: [id number])  Annette Sharp  04-30-1940 Date of Surgery: 06/02/2017   Diagnoses:  DJD LEFT SHOULDER  Procedures: Reverse total shoulder arthroplasty for irreparable cuff tear   Operative Finding Successful completion of planned procedure.  Good stability and nerve intact at end of case on tug test.  Subscap repair robust and stable to 30 deg easily ER.  Post-operative plan: The patient will be admitted overnight.  The patient will be NWB in sling.  DVT prophylaxis not indicated in isolated upper extremity surgery patient with no specific risks factors.  Pain control with PRN pain medication preferring oral medicines.  Follow up plan will be scheduled in approximately 7 days for XR and incision check.  Post-Op Diagnosis: Same Surgeons:Primary: Hiram Gash, MD Assistants:Brandon Leslee Home OPA Location: [facility] OR ROOM 03 Anesthesia: General Antibiotics: Ancef 2g preop Tourniquet time: * No tourniquets in log * Estimated Blood Loss: 426 Complications: None Specimens: None Implants: Implant Name Type Inv. Item Serial No. Manufacturer Lot No. LRB No. Used Action  BASEPLATE GLENOSPHERE 83MH STD - [id number] Miscellaneous BASEPLATE GLENOSPHERE 21JH STD [id number] [employer]  Left 1 Implanted  Lateralized Glenosphere    [id number] [employer]  Left 1 Implanted  BONE SCREW THREAD 6.5X35MM - [id number] Screw BONE SCREW THREAD 6.5X35MM  [employer]  Left 1 Implanted  SCREW 5.0X42    [employer]  Left 1 Implanted  screw 5.0x30      Left 1 Implanted  IMPLANT REVERSE SHOULDER 0X3.5 - [id number] Shoulder IMPLANT REVERSE SHOULDER 0X3.5 [id number] [employer]  Left 1 Implanted  standard ptc humeral stem    [id number] [employer]  Left 1 Implanted  Reversed insert   [id number]   Left 1 Implanted    Indications for Surgery:   Annette Sharp is a 77 y.o. 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  Benefits and risks of operative  and nonoperative management were discussed prior to surgery with patient/guardian(s) and informed consent form was completed.  Specific risks including infection, need for additional surgery, dislocation, fracture, periprosthetic complications.   Procedure:   The patient was identified in the preoperative holding area where the surgical site was marked. The patient was taken to the OR where a procedural timeout was called and the above noted anesthesia was induced.  The patient was positioned beachchair on allen with spider arm holder.  Preoperative antibiotics were dosed.  The patient's left arm was prepped and draped in the usual sterile fashion.  A second preoperative timeout was called.      Standard deltopectoral approach was performed with a #10 blade. We dissected down to the subcutaneous tissues and the cephalic vein was taken laterally with the deltoid. Clavipectoral fascia was incised in line with the incision. Deep retractors were placed. The long of the biceps tendon was identified and there was significant tenosynovitis present.  Tenodesis was performed to the pectoralis tendon with #2 Ethibond. The remaining biceps was followed up into the rotator interval where it was released.   The subscapularis was taken down in a full thickness layer with capsule along the humeral neck extending inferiorly around the humeral head. We continued releasing the capsule directly off of the osteophytes inferiorly all the way around the corner. This allowed [place] to dislocate the humeral head.   The humeral head had evidence of mild wear.  The rotator cuff was carefully examined and noted to be irreperably torn.  The decision was confirmed that  a reverse total shoulder was indicated for this patient.  There were osteophytes along the inferior humeral neck. The osteophytes were removed with an osteotome and a rongeur.  Osteophytes were removed with a rongeur and an osteotome and the anatomic neck was well  visualized.     A humeral cutting guide was inserted down the intramedullary canal. The version was set at 20 of retroversion. Humeral osteotomy was performed with an oscillating saw. The head fragment was passed off the back table. A starter awl was used to open the humeral canal. We next used T-handle straight sound reamers to ream up to an appropriate fit. A chisel was used to remove proximal humeral bone. We then broached starting with a size one broach and broaching up to 2 which obtained an appropriate fit. The broach handle was removed. A cut protector was placed. The broach handle was removed and a cut protector was placed. The humerus was retracted posteriorly and we turned our attention to glenoid exposure.  The subscapularis was again identified and immediately we took care to palpate the axillary nerve anteriorly and verify its position with gentle palpation as well as the tug test.  We then released the SGHL with bovie cautery prior to placing a curved mayo at the junction of the anterior glenoid well above the axillary nerve and bluntly dissecting the subscapularis from the capsule.  We then carefully protected the axillary nerve as we gently released the inferior capsule to fully mobilize the subscapularis.  An anterior deltoid retractor was then placed as well as a small Hohmann retractor superiorly.   The glenoid was inspected and had evidence of mild superior wear in the setting of an irreparable cuff and pseudoparalysis.  The remaining labrum was removed circumferentially taking great care not to disrupt the posterior capsule.   The glenoid drill guide was placed and used to drill a guide pin in the center, inferior position. The glenoid face was then reamed concentrically over the guide wire. The center hole was drilled over the guidepin in a near anatomic angle of version. Next the glenoid vault was drilled back to a depth of 35 mm.  We tapped and then placed a 31m size baseplate with  059mlateralization was selected with a 35 mm x 6.5 mm length central screw.  The base plate was screwed into the glenoid vault obtaining secure fixation. We next placed superior and inferior locking screws for additional fixation.  Next a 33 +3 lateralized glenosphere was selected and impacted onto the baseplate. The center screw was tightened.  We turned attention back to the humeral side. The cut protector was removed. We trialed with multiple size tray and polyethylene options and selected a 6 which provided good stability and range of motion without excess soft tissue tension. The offset was dialed in to match the normal anatomy. The shoulder was trialed.  There was good ROM in all planes and the shoulder was stable with no inferior translation.  The real humeral implants were opened after again confirming sizes.  The trial was removed. #5 Fiberwire sutures passed through the humeral neck for subscap repair. The humeral component was press-fit obtaining a secure fit. A +0 high offset tray was selected and impacted onto the stem.  A 33+6 polyethylene liner was impacted onto the stem.  The joint was reduced and thoroughly irrigated with pulsatile lavage. Subscap was repaired back with #5 Fiberwire sutures through bone tunnels. Hemostasis was obtained. The deltopectoral interval was reapproximated with #1 Ethibond.  The subcutaneous tissues were closed with 2-0 Vicryl and the skin was closed with running monocryl.    The wounds were cleaned and dried and an Aquacel dressing was placed. The drapes taken down. The arm was placed into sling with abduction pillow. Patient was awakened, extubated, and transferred to the recovery room in stable condition. There were no intraoperative complications. The sponge, needle, and attention counts were correct at the end of the case.    Joya Gaskins, OPA-C, present and scrubbed throughout the case, critical for completion in a timely fashion, and for retraction,  instrumentation, closure.

## 2017-06-02 NOTE — Interval H&P Note (Signed)
Discussed case, risks and benefits with patient again.  All questions answered, no change to history.  Neeley Sedivy MD  

## 2017-06-02 NOTE — Anesthesia Postprocedure Evaluation (Signed)
Anesthesia Post Note  Patient: Josclyn C Bhatnagar  Procedure(s) Performed: TOTAL SHOULDER ARTHROPLASTY (Left Shoulder)     Patient location during evaluation: PACU Anesthesia Type: General Level of consciousness: awake and alert Pain management: pain level controlled Vital Signs Assessment: post-procedure vital signs reviewed and stable Respiratory status: spontaneous breathing, nonlabored ventilation, respiratory function stable and patient connected to nasal cannula oxygen Cardiovascular status: blood pressure returned to baseline and stable Postop Assessment: no apparent nausea or vomiting Anesthetic complications: no    Last Vitals:  Vitals:   06/02/17 1150 06/02/17 1220  BP: (!) 105/49 (!) 110/54  Pulse: (!) 51 (!) 51  Resp: 18 (!) 23  Temp:    SpO2: 97% 94%    Last Pain:  Vitals:   06/02/17 1120  TempSrc:   PainSc: 0-No pain                 Sharnise Blough DAVID

## 2017-06-02 NOTE — Anesthesia Procedure Notes (Signed)
Procedure Name: Intubation Date/Time: 06/02/2017 8:47 AM Performed by: Lewayne Pauley T, CRNA Pre-anesthesia Checklist: Patient identified, Emergency Drugs available, Suction available and Patient being monitored Patient Re-evaluated:Patient Re-evaluated prior to induction Oxygen Delivery Method: Circle system utilized Preoxygenation: Pre-oxygenation with 100% oxygen Induction Type: IV induction Ventilation: Mask ventilation without difficulty Laryngoscope Size: Miller and 2 Grade View: Grade I Tube type: Oral Tube size: 7.5 mm Number of attempts: 1 Airway Equipment and Method: Patient positioned with wedge pillow and Stylet Placement Confirmation: ETT inserted through vocal cords under direct vision,  positive ETCO2 and breath sounds checked- equal and bilateral Secured at: 21 cm Tube secured with: Tape Dental Injury: Teeth and Oropharynx as per pre-operative assessment

## 2017-06-02 NOTE — Transfer of Care (Signed)
Immediate Anesthesia Transfer of Care Note  Patient: Annette Sharp  Procedure(s) Performed: TOTAL SHOULDER ARTHROPLASTY (Left Shoulder)  Patient Location: PACU  Anesthesia Type:GA combined with regional for post-op pain  Level of Consciousness: awake, alert  and oriented  Airway & Oxygen Therapy: Patient Spontanous Breathing and Patient connected to nasal cannula oxygen  Post-op Assessment: Report given to RN and Post -op Vital signs reviewed and stable  Post vital signs: Reviewed and stable  Last Vitals:  Vitals:   06/02/17 0643 06/02/17 1050  BP: (!) 161/46 112/87  Pulse: (!) 48 64  Resp: 20 19  Temp: 36.8 C (!) 36.3 C  SpO2: 100% 100%    Last Pain:  Vitals:   06/02/17 1050  TempSrc:   PainSc: 0-No pain      Patients Stated Pain Goal: 3 (19/62/22 [id number])  Complications: No apparent anesthesia complications

## 2017-06-03 ENCOUNTER — Other Ambulatory Visit: Payer: Self-pay

## 2017-06-03 MED ORDER — OXYCODONE HCL 5 MG PO TABS: ORAL_TABLET | ORAL | 0 refills | Status: AC

## 2017-06-03 MED ORDER — MELOXICAM 7.5 MG PO TABS: 7.5000 mg | ORAL_TABLET | Freq: Every day | ORAL | 2 refills | Status: DC

## 2017-06-03 MED ORDER — ONDANSETRON HCL 4 MG PO TABS: 4.0000 mg | ORAL_TABLET | Freq: Three times a day (TID) | ORAL | 1 refills | Status: AC | PRN

## 2017-06-03 MED ORDER — ACETAMINOPHEN 500 MG PO TABS: 1000.0000 mg | ORAL_TABLET | Freq: Three times a day (TID) | ORAL | 0 refills | Status: AC

## 2017-06-03 NOTE — Plan of Care (Signed)
  Adequate for Discharge Education: Knowledge of General Education information will improve 06/03/2017 1112 - Adequate for Discharge by Rance Muir, RN Health Behavior/Discharge Planning: Ability to manage health-related needs will improve 06/03/2017 1112 - Adequate for Discharge by Rance Muir, RN Clinical Measurements: Ability to maintain clinical measurements within normal limits will improve 06/03/2017 1112 - Adequate for Discharge by Rance Muir, RN Will remain free from infection 06/03/2017 1112 - Adequate for Discharge by Rance Muir, RN Diagnostic test results will improve 06/03/2017 1112 - Adequate for Discharge by Rance Muir, RN Respiratory complications will improve 06/03/2017 1112 - Adequate for Discharge by Rance Muir, RN Cardiovascular complication will be avoided 06/03/2017 1112 - Adequate for Discharge by Rance Muir, RN Activity: Risk for activity intolerance will decrease 06/03/2017 1112 - Adequate for Discharge by Rance Muir, RN Nutrition: Adequate nutrition will be maintained 06/03/2017 1112 - Adequate for Discharge by Rance Muir, RN Coping: Level of anxiety will decrease 06/03/2017 1112 - Adequate for Discharge by Rance Muir, RN Elimination: Will not experience complications related to bowel motility 06/03/2017 1112 - Adequate for Discharge by Rance Muir, RN Will not experience complications related to urinary retention 06/03/2017 1112 - Adequate for Discharge by Rance Muir, RN Pain Managment: General experience of comfort will improve 06/03/2017 1112 - Adequate for Discharge by Rance Muir, RN Safety: Ability to remain free from injury will improve 06/03/2017 1112 - Adequate for Discharge by Rance Muir, RN Skin Integrity: Risk for impaired skin integrity will decrease 06/03/2017 1112 - Adequate for Discharge by Rance Muir, RN

## 2017-06-03 NOTE — Evaluation (Signed)
Occupational Therapy Evaluation Patient Details Name: Annette Sharp MRN: [id number] DOB: September 18, 1940 Today's Date: 06/03/2017    History of Present Illness Pt is a 77 y.o. female s/p L Reverse TSA. PMHx: Anxiety, Back pain, COPD, CAD, Depression, Hyperlipidemia, HTN, Hypothyroidism, Sleep apnea, Stroke, Thyroid disease, Cardiac cath 2016, Bil TKA, R rotator cuff repair 2011.    Clinical Impression   Pt reports she was independent with ADL PTA. Currently pt overall supervision for functional mobility and min assist for ADL. All shoulder, ADL, and safety education completed with pt and husband. From an OT standpoint, pt ready for d/c home with 24/7 supervision from her husband. Will continue to follow acutely as pt would benefit from continued skilled OT to address established goals.    Follow Up Recommendations  Follow surgeon's recommendation for DC plan and follow-up therapies;Supervision - Intermittent    Equipment Recommendations  None recommended by OT    Recommendations for Other Services       Precautions / Restrictions Precautions Precautions: Fall;Shoulder Type of Shoulder Precautions: NO ER, NO shoulder extension, NO passive FF >70. Shoulder Interventions: Shoulder sling/immobilizer Precaution Booklet Issued: Yes (comment) Required Braces or Orthoses: Sling Restrictions Weight Bearing Restrictions: Yes LUE Weight Bearing: Non weight bearing      Mobility Bed Mobility Overal bed mobility: Needs Assistance Bed Mobility: Supine to Sit     Supine to sit: Min assist     General bed mobility comments: Min HHA for trunk elevation to sitting, daughter can provide this level of assist. Exiting to L side to simulate home environment.  Transfers Overall transfer level: Needs assistance Equipment used: None Transfers: Sit to/from Stand Sit to Stand: Supervision         General transfer comment: for safety, no physical assist required    Balance Overall balance  assessment: Mild deficits observed, not formally tested                                         ADL either performed or assessed with clinical judgement   ADL Overall ADL's : Needs assistance/impaired Eating/Feeding: Set up;Sitting   Grooming: Supervision/safety;Standing   Upper Body Bathing: Minimal assistance;Sitting   Lower Body Bathing: Minimal assistance;Sit to/from stand   Upper Body Dressing : Minimal assistance;Sitting   Lower Body Dressing: Minimal assistance;Sit to/from stand   Toilet Transfer: Supervision/safety;Ambulation Toilet Transfer Details (indicate cue type and reason): Simulated by sit to stand from EOB with functional mobility       Tub/Shower Transfer Details (indicate cue type and reason): Discussed sitting on shower chair for safety during bathing Functional mobility during ADLs: Supervision/safety General ADL Comments: Educated pt on proper positioning of LUE in bed/chair, sling management and wear schedule, LUE NWB status, LUE exercises, UB bathing/dressing technqiue.     Vision         Perception     Praxis      Pertinent Vitals/Pain Pain Assessment: Faces Faces Pain Scale: Hurts little more Pain Location: L shoulder Pain Descriptors / Indicators: Grimacing;Sore Pain Intervention(s): Monitored during session;Repositioned;Ice applied     Hand Dominance Right   Extremity/Trunk Assessment Upper Extremity Assessment Upper Extremity Assessment: LUE deficits/detail LUE Deficits / Details: Elbow, wrist, hand ROM WFL. Minimal increase in edema. Sensation intact LUE: Unable to fully assess due to immobilization   Lower Extremity Assessment Lower Extremity Assessment: Overall WFL for tasks assessed  Communication Communication Communication: No difficulties   Cognition Arousal/Alertness: Awake/alert Behavior During Therapy: WFL for tasks assessed/performed Overall Cognitive Status: Within Functional Limits for tasks  assessed                                     General Comments       Exercises Exercises: Shoulder Shoulder Exercises Elbow Flexion: AROM;Left;10 reps;Seated Elbow Extension: AROM;Left;10 reps;Seated Wrist Flexion: AROM;Left;10 reps;Seated Wrist Extension: AROM;Left;10 reps;Seated Digit Composite Flexion: AROM;Left;10 reps;Seated Composite Extension: AROM;Left;10 reps;Seated   Shoulder Instructions Shoulder Instructions Donning/doffing shirt without moving shoulder: Minimal assistance Method for sponge bathing under operated UE: Minimal assistance Donning/doffing sling/immobilizer: Minimal assistance Correct positioning of sling/immobilizer: Supervision/safety ROM for elbow, wrist and digits of operated UE: Supervision/safety Sling wearing schedule (on at all times/off for ADL's): Supervision/safety Proper positioning of operated UE when showering: Supervision/safety Positioning of UE while sleeping: Minimal assistance    Home Living Family/patient expects to be discharged to:: Private residence Living Arrangements: Spouse/significant other Available Help at Discharge: Family;Available 24 hours/day(husband, daughter) Type of Home: House Home Access: Stairs to enter;Ramped entrance Entrance Stairs-Number of Steps: 2   Home Layout: One level     Bathroom Shower/Tub: Occupational psychologist: Standard     Home Equipment: Environmental consultant - 2 wheels;Cane - single point;Bedside commode;Shower seat - built in          Prior Functioning/Environment Level of Independence: Independent                 OT Problem List: Decreased strength;Impaired balance (sitting and/or standing);Decreased knowledge of precautions;Decreased range of motion;Pain;Impaired UE functional use;Increased edema      OT Treatment/Interventions: Self-care/ADL training;Therapeutic exercise;DME and/or AE instruction;Therapeutic activities;Patient/family education;Balance training     OT Goals(Current goals can be found in the care plan section) Acute Rehab OT Goals Patient Stated Goal: return home OT Goal Formulation: With patient/family Time For Goal Achievement: 06/17/17 Potential to Achieve Goals: Good ADL Goals Pt Will Perform Upper Body Bathing: with supervision;sitting Pt Will Perform Upper Body Dressing: with supervision;sitting Additional ADL Goal #1: Pt will independently perform LUE elbow/wrist/hand AROM exercises.  OT Frequency: Min 3X/week   Barriers to D/C:            Co-evaluation              AM-PAC PT "6 Clicks" Daily Activity     Outcome Measure Help from another person eating meals?: None Help from another person taking care of personal grooming?: A Little Help from another person toileting, which includes using toliet, bedpan, or urinal?: A Little Help from another person bathing (including washing, rinsing, drying)?: A Little Help from another person to put on and taking off regular upper body clothing?: A Little Help from another person to put on and taking off regular lower body clothing?: A Little 6 Click Score: 19   End of Session Equipment Utilized During Treatment: Other (comment)(L sling) Nurse Communication: Mobility status;Other (comment);Weight bearing status(ready for d/c from OT standpoint)  Activity Tolerance: Patient tolerated treatment well Patient left: in chair;with call bell/phone within reach;with family/visitor present  OT Visit Diagnosis: Unsteadiness on feet (R26.81);History of falling (Z91.81);Pain Pain - Right/Left: Left Pain - part of body: Shoulder                Time: 1324-4010 OT Time Calculation (min): 30 min Charges:  OT General Charges $OT Visit: 1 Visit OT  Evaluation $OT Eval Moderate Complexity: 1 Mod OT Treatments $Self Care/Home Management : 8-22 mins G-Codes:     Jedaiah Rathbun A. Ulice Brilliant, M.S., OTR/L Pager: [place] 06/03/2017, 8:35 AM

## 2017-06-04 NOTE — Discharge Summary (Signed)
Patient ID: Annette Sharp MRN: [id number] DOB/AGE: [birth year] 75 y.o.  Admit date: 06/02/2017 Discharge date: 06/04/2017  Admission Diagnoses:L cuff tear arthropathy  Discharge Diagnoses:  Active Problems:   Left rotator cuff tear arthropathy   Past Medical History:  Diagnosis Date  . Anxiety   . Back pain   . Cerebrovascular disease    NONOBSTRUCTIVE CVA BY CAROTID DOPPLERS OCTOBER 2008  . Chronic renal insufficiency    GFR 45 ML'S PER MINUTE  . COPD (chronic obstructive pulmonary disease) ([place])   . Coronary artery disease    S/P CYPHER DRUG-ELUTING STENTS TO RIGHT CORONARY ARTERY JUNE 2007  . Coronary atherosclerosis of native coronary artery    NEGATIVE LEXISCAN FOR ISCHEMIA MARCH 23,2010 NORMAL LV FUNCTTION  . Depression   . Dyspnea    with exertion  . GERD (gastroesophageal reflux disease)   . Headache    chronic headache from fall in December  . Hyperlipidemia   . Hypertension   . Hypothyroidism   . Other and unspecified angina pectoris    ANGINA  . Overweight(278.02)    OBESITY  . Peptic ulcer disease   . Primary localized osteoarthritis of left knee   . Sleep apnea    SEVERE OBSTRUCTIVE    cpap  . Stroke [facility])    affected memory  . Thyroid disease    TREATED HYPOTHYROIDISM     Procedures Performed:  L RTSA  Discharged Condition: good  Hospital Course: Patient brought in as an outpatient for surgery.  Tolerated procedure well.  Was kept for monitoring overnight for pain control and medical monitoring postop and was found to be stable for DC home the morning after surgery.  Patient was instructed on specific activity restrictions and all questions were answered.   Consults: None  Significant Diagnostic Studies: No additional pertinent studies  Treatments: Surgery  Discharge Exam:  Dressing CDI and sling well fitting,  full and painless ROM throughout hand with DPC of 0. + Motor in  AIN, PIN, Ulnar distributions. Axillary nerve sensation preserved  and symmetric.  Sensation intact in medial, radial, and ulnar distributions. Well perfused digits.    Disposition: 01-Home or Self Care  Discharge Instructions    Call MD for:  persistant nausea and vomiting   Complete by:  As directed    Call MD for:  redness, tenderness, or signs of infection (pain, swelling, redness, odor or green/yellow discharge around incision site)   Complete by:  As directed    Call MD for:  severe uncontrolled pain   Complete by:  As directed    Diet - low sodium heart healthy   Complete by:  As directed    Discharge instructions   Complete by:  As directed    Ophelia Charter MD, MPH [place]. [address] [phone] (tel)   [phone] (fax)   [place] may leave the operative dressing in place until your follow-up appointment. KEEP THE INCISIONS CLEAN AND DRY. Use the Cryocuff, GameReady or Ice as often as possible for the first 3-4 days, then as needed for pain relief.  You may shower on Post-Op Day #2. The dressing is water resistant but do not scrub it as it may start to peel up.  You may remove the sling for showering, but keep a water resistant pillow under the arm to keep both the elbow and shoulder away from the body (mimicking the abduction  sling). Gently pat the area dry. Do not soak the shoulder in water. Do not go swimming in the pool or ocean until your sutures are removed.  EXERCISES Wear the sling at all times except when doing your exercises. You may remove the sling for showering, but keep the arm across the chest or in a secondary sling.   Accidental/Purposeful External Rotation and shoulder flexion (reaching behind you) is to be avoided at all costs for the first month. Please perform the exercises:   Elbow / Hand / Wrist  Range of Motion Exercises POST-OP A multi-modal approach will be used to treat your pain. Oxycodone - This is a  strong narcotic, to be used only on an "as needed" basis for pain. Meloxicam- An anti-inflammatory medication Acetaminophen - A non-narcotic pain medicine.  Use 1000mg  three times a day for the first 14 days after surgery If you have any adverse effects with the medications, please call our office.  FOLLOW-UP If you develop a Fever (>101.5), Redness or Drainage from the surgical incision site, please call our office to arrange for an evaluation. Please call the office to schedule a follow-up appointment for a wound check, 7-10 days post-operatively.    IF YOU HAVE ANY QUESTIONS, PLEASE FEEL FREE TO CALL OUR OFFICE.   HELPFUL INFORMATION  Your arm will be in a sling following surgery. You will be in this sling for the next 3-4 weeks.  I will let you know the exact duration at your follow-up visit.  You may be more comfortable sleeping in a semi-seated position the first few nights following surgery.  Keep a pillow propped under the elbow and forearm for comfort.  If you have a recliner type of chair it might be beneficial.  If not that is fine too, but it would be helpful to sleep propped up with pillows behind your operated shoulder as well under your elbow and forearm.  This will reduce pulling on the suture lines.  We suggest you use the pain medication the first night prior to going to bed, in order to ease any pain when the anesthesia wears off. You should avoid taking pain medications on an empty stomach as it will make you nauseous.  Do not drink alcoholic beverages or take illicit drugs when taking pain medications.  In most states it is against the law to drive while your arm is in a sling. And certainly against the law to drive while taking narcotics.  You may return to work/school in the next couple of days when you feel up to it. Desk work and typing in the sling is fine.  When dressing, put your operative arm in the sleeve first.  When getting undressed, take your operative  arm out last.  Loose fitting, button-down shirts are recommended.  Pain medication may make you constipated.  Below are a few solutions to try in this order: Decrease the amount of pain medication if you aren't having pain. Drink lots of decaffeinated fluids. Drink prune juice and/or each dried prunes  If the first 3 don't work start with additional solutions Take Colace - an over-the-counter stool softener Take Senokot - an over-the-counter laxative Take Miralax - a stronger over-the-counter laxative   Increase activity slowly   Complete by:  As directed      Allergies as of 06/03/2017      Reactions   Ace Inhibitors Cough   Gemfibrozil Other (See Comments)   Muscle Aches   Statins Other (See Comments)  Muscle aches.   Moxifloxacin Rash   Ranexa [ranolazine] Other (See Comments)   "NIGHTMARES"      Medication List    TAKE these medications   acetaminophen 500 MG tablet Commonly known as:  TYLENOL Take 2 tablets (1,000 mg total) by mouth every 8 (eight) hours for 14 days. What changed:    how much to take  when to take this  reasons to take this   acyclovir 400 MG tablet Commonly known as:  ZOVIRAX Take 400 mg by mouth every morning.   amLODipine 10 MG tablet Commonly known as:  NORVASC Take 10 mg by mouth every morning.   aspirin EC 81 MG tablet Take 81 mg by mouth daily.   betamethasone valerate 0.1 % cream Commonly known as:  VALISONE Apply 1 application topically daily as needed (for lichen scheleros).   cetirizine 10 MG tablet Commonly known as:  ZYRTEC Take 10 mg by mouth every morning.   citalopram 20 MG tablet Commonly known as:  CELEXA Take 20 mg by mouth every morning.   clopidogrel 75 MG tablet Commonly known as:  PLAVIX Take 75 mg by mouth every morning.   docusate sodium 100 MG capsule Commonly known as:  COLACE 1 tab 2 times a day while on narcotics.  STOOL SOFTENER What changed:    how much to take  how to take this  when to  take this  reasons to take this  additional instructions   isosorbide mononitrate 60 MG 24 hr tablet Commonly known as:  IMDUR Take 60 mg by mouth 2 (two) times daily.   levothyroxine 88 MCG tablet Commonly known as:  SYNTHROID, LEVOTHROID Take 88 mcg by mouth every morning.   meloxicam 7.5 MG tablet Commonly known as:  MOBIC Take 1 tablet (7.5 mg total) by mouth daily.   metoprolol tartrate 25 MG tablet Commonly known as:  LOPRESSOR Take 0.5 tablets (12.5 mg total) by mouth 2 (two) times daily.   omeprazole 20 MG capsule Commonly known as:  PRILOSEC Take 20 mg by mouth daily as needed (FOR HEARTBURN/INDIGESTION.).   ondansetron 4 MG tablet Commonly known as:  ZOFRAN Take 1 tablet (4 mg total) by mouth every 8 (eight) hours as needed for up to 7 days for nausea or vomiting.   oxyCODONE 5 MG immediate release tablet Commonly known as:  Oxy IR/ROXICODONE Take 1-2 pills every 6 hrs as needed for pain   polyethylene glycol packet Commonly known as:  MIRALAX / GLYCOLAX 17grams in 16 oz of water twice a day until bowel movement.  LAXITIVE.  Restart if two days since last bowel movement What changed:    how much to take  how to take this  when to take this  reasons to take this  additional instructions

## 2017-06-07 ENCOUNTER — Telehealth: Payer: Self-pay

## 2017-06-07 DIAGNOSIS — E782 Mixed hyperlipidemia: Secondary | ICD-10-CM

## 2017-06-07 MED ORDER — ROSUVASTATIN CALCIUM 5 MG PO TABS: ORAL_TABLET | ORAL | 3 refills | Status: DC

## 2017-06-07 NOTE — Telephone Encounter (Signed)
-----   Message from Rena Lara, [facility] sent at 06/07/2017  9:28 AM EDT ----- Regarding: RE: PCSK9i Please order crestor 5mg   every Monday and Friday. Repeat fasting lipid panel 6-8 weeks after initiating therapy.  We can not get PCSK9i prior-authorization unless we can show failure to 2 statins (low intensity and high intensity). Failure to ONLY one statin in 20014 is not enough to get approval. If patient refused crestor, we can use livalo 2mg  daily instead.  Okay to schedule visit with pharmacist for lipid management (next available) at [facility] office for further assessment and plan.  Thank you  Raquel Rodriguez-Guzman PharmD, BCPS, [place] [address] [place] [address] 06/07/2017 9:31 AM    ----- Message ----- From: Bernita Raisin, RN Sent: 05/25/2017  12:57 PM To: Harrington Challenger, RPH Subject: RE: [id number]                                     Herminio Commons, MD  Bernita Raisin, RN    She had been on Crestor (see my notes dating back to 2014). She then didn't follow up for years and recently saw me and is reportedly "statin intolerant". I'd be fine trying to resume Crestor 5 mg daily if patient is willing. Pass this info to PharmD please.    ----- Message ----- From: Harrington Challenger, [facility] Sent: 05/25/2017  12:25 PM To: Bernita Raisin, RN Subject: [id number]                                         HI Cathey  I am working on Ms Dart Repatha . I received the labs and have most recent note.  Need insurance information (part D) and statin use history. I was able to find failures to rosuvastatin but can not see any other statin use.  Raquel

## 2017-06-07 NOTE — Telephone Encounter (Signed)
Pt agrees to take crestor 5 mg twice a week.mailed lab slip

## 2017-06-08 ENCOUNTER — Encounter (HOSPITAL_COMMUNITY): Payer: Self-pay | Admitting: Orthopaedic Surgery

## 2017-06-10 DIAGNOSIS — M19012 Primary osteoarthritis, left shoulder: Secondary | ICD-10-CM | POA: Diagnosis not present

## 2017-06-17 DIAGNOSIS — S46012A Strain of muscle(s) and tendon(s) of the rotator cuff of left shoulder, initial encounter: Secondary | ICD-10-CM | POA: Diagnosis not present

## 2017-06-21 DIAGNOSIS — S46012A Strain of muscle(s) and tendon(s) of the rotator cuff of left shoulder, initial encounter: Secondary | ICD-10-CM | POA: Diagnosis not present

## 2017-06-24 DIAGNOSIS — S46012A Strain of muscle(s) and tendon(s) of the rotator cuff of left shoulder, initial encounter: Secondary | ICD-10-CM | POA: Diagnosis not present

## 2017-06-28 DIAGNOSIS — S46012A Strain of muscle(s) and tendon(s) of the rotator cuff of left shoulder, initial encounter: Secondary | ICD-10-CM | POA: Diagnosis not present

## 2017-06-30 DIAGNOSIS — S46012A Strain of muscle(s) and tendon(s) of the rotator cuff of left shoulder, initial encounter: Secondary | ICD-10-CM | POA: Diagnosis not present

## 2017-07-01 DIAGNOSIS — M19012 Primary osteoarthritis, left shoulder: Secondary | ICD-10-CM | POA: Diagnosis not present

## 2017-07-05 DIAGNOSIS — S46012A Strain of muscle(s) and tendon(s) of the rotator cuff of left shoulder, initial encounter: Secondary | ICD-10-CM | POA: Diagnosis not present

## 2017-07-08 DIAGNOSIS — S46012A Strain of muscle(s) and tendon(s) of the rotator cuff of left shoulder, initial encounter: Secondary | ICD-10-CM | POA: Diagnosis not present

## 2017-07-12 DIAGNOSIS — S46012A Strain of muscle(s) and tendon(s) of the rotator cuff of left shoulder, initial encounter: Secondary | ICD-10-CM | POA: Diagnosis not present

## 2017-07-14 DIAGNOSIS — S46012A Strain of muscle(s) and tendon(s) of the rotator cuff of left shoulder, initial encounter: Secondary | ICD-10-CM | POA: Diagnosis not present

## 2017-07-19 DIAGNOSIS — S46012A Strain of muscle(s) and tendon(s) of the rotator cuff of left shoulder, initial encounter: Secondary | ICD-10-CM | POA: Diagnosis not present

## 2017-07-20 ENCOUNTER — Other Ambulatory Visit (HOSPITAL_COMMUNITY)
Admission: RE | Admit: 2017-07-20 | Discharge: 2017-07-20 | Disposition: A | Discharge status: Home / Self Care | Payer: Medicare Other | Source: Ambulatory Visit | Attending: Pharmacist | Admitting: Pharmacist

## 2017-07-20 DIAGNOSIS — E782 Mixed hyperlipidemia: Secondary | ICD-10-CM | POA: Insufficient documentation

## 2017-07-20 LAB — LIPID PANEL
CHOL/HDL RATIO: 3.2 ratio
CHOLESTEROL: 188 mg/dL (ref 0–200)
HDL: 58 mg/dL (ref >40)
LDL Cholesterol: 114 mg/dL — ABNORMAL HIGH (ref 0–99)
TRIGLYCERIDES: 80 mg/dL (ref <150)
VLDL: 16 mg/dL (ref 0–40)

## 2017-07-22 DIAGNOSIS — S46012A Strain of muscle(s) and tendon(s) of the rotator cuff of left shoulder, initial encounter: Secondary | ICD-10-CM | POA: Diagnosis not present

## 2017-07-23 ENCOUNTER — Telehealth: Payer: Self-pay

## 2017-07-23 MED ORDER — ROSUVASTATIN CALCIUM 5 MG PO TABS: ORAL_TABLET | ORAL | 3 refills | Status: DC

## 2017-07-23 NOTE — Telephone Encounter (Signed)
Patient tolerating rosuvastatin twice a week, will increase to 3 times a week

## 2017-07-23 NOTE — Telephone Encounter (Signed)
-----   Message from [place], [facility] sent at 07/20/2017 12:27 PM EDT ----- Patient taking rosuvastatin 5mg  twice weekly? Please increase to 3x/week if tolerating or scheduel to see pharmacist for potential PCSK9i initation

## 2017-07-27 DIAGNOSIS — S46012A Strain of muscle(s) and tendon(s) of the rotator cuff of left shoulder, initial encounter: Secondary | ICD-10-CM | POA: Diagnosis not present

## 2017-07-29 DIAGNOSIS — S46012A Strain of muscle(s) and tendon(s) of the rotator cuff of left shoulder, initial encounter: Secondary | ICD-10-CM | POA: Diagnosis not present

## 2017-08-03 DIAGNOSIS — S46012A Strain of muscle(s) and tendon(s) of the rotator cuff of left shoulder, initial encounter: Secondary | ICD-10-CM | POA: Diagnosis not present

## 2017-08-10 DIAGNOSIS — S46012A Strain of muscle(s) and tendon(s) of the rotator cuff of left shoulder, initial encounter: Secondary | ICD-10-CM | POA: Diagnosis not present

## 2017-08-12 DIAGNOSIS — S46012A Strain of muscle(s) and tendon(s) of the rotator cuff of left shoulder, initial encounter: Secondary | ICD-10-CM | POA: Diagnosis not present

## 2017-08-17 DIAGNOSIS — S46012A Strain of muscle(s) and tendon(s) of the rotator cuff of left shoulder, initial encounter: Secondary | ICD-10-CM | POA: Diagnosis not present

## 2017-08-18 DIAGNOSIS — Z6836 Body mass index (BMI) 36.0-36.9, adult: Secondary | ICD-10-CM | POA: Diagnosis not present

## 2017-08-18 DIAGNOSIS — L209 Atopic dermatitis, unspecified: Secondary | ICD-10-CM | POA: Diagnosis not present

## 2017-08-19 DIAGNOSIS — S46012A Strain of muscle(s) and tendon(s) of the rotator cuff of left shoulder, initial encounter: Secondary | ICD-10-CM | POA: Diagnosis not present

## 2017-08-26 DIAGNOSIS — M19012 Primary osteoarthritis, left shoulder: Secondary | ICD-10-CM | POA: Diagnosis not present

## 2017-10-25 DIAGNOSIS — H2511 Age-related nuclear cataract, right eye: Secondary | ICD-10-CM | POA: Diagnosis not present

## 2017-10-25 DIAGNOSIS — H25812 Combined forms of age-related cataract, left eye: Secondary | ICD-10-CM | POA: Diagnosis not present

## 2017-10-25 DIAGNOSIS — H2512 Age-related nuclear cataract, left eye: Secondary | ICD-10-CM | POA: Diagnosis not present

## 2017-10-25 DIAGNOSIS — Z01818 Encounter for other preprocedural examination: Secondary | ICD-10-CM | POA: Diagnosis not present

## 2017-10-25 DIAGNOSIS — H2513 Age-related nuclear cataract, bilateral: Secondary | ICD-10-CM | POA: Diagnosis not present

## 2017-11-24 DIAGNOSIS — H2511 Age-related nuclear cataract, right eye: Secondary | ICD-10-CM | POA: Diagnosis not present

## 2017-11-24 DIAGNOSIS — H25811 Combined forms of age-related cataract, right eye: Secondary | ICD-10-CM | POA: Diagnosis not present

## 2017-11-24 DIAGNOSIS — H2513 Age-related nuclear cataract, bilateral: Secondary | ICD-10-CM | POA: Diagnosis not present

## 2017-12-01 ENCOUNTER — Encounter (HOSPITAL_COMMUNITY): Payer: Self-pay | Admitting: Emergency Medicine

## 2017-12-01 ENCOUNTER — Emergency Department (HOSPITAL_COMMUNITY)
Admission: EM | Admit: 2017-12-01 | Discharge: 2017-12-01 | Disposition: A | Discharge status: Home / Self Care | Payer: Medicare Other | Attending: Emergency Medicine | Admitting: Emergency Medicine

## 2017-12-01 ENCOUNTER — Emergency Department (HOSPITAL_COMMUNITY): Payer: Medicare Other

## 2017-12-01 DIAGNOSIS — N289 Disorder of kidney and ureter, unspecified: Secondary | ICD-10-CM | POA: Diagnosis not present

## 2017-12-01 DIAGNOSIS — Z8673 Personal history of transient ischemic attack (TIA), and cerebral infarction without residual deficits: Secondary | ICD-10-CM | POA: Insufficient documentation

## 2017-12-01 DIAGNOSIS — Z7902 Long term (current) use of antithrombotics/antiplatelets: Secondary | ICD-10-CM | POA: Diagnosis not present

## 2017-12-01 DIAGNOSIS — Z7982 Long term (current) use of aspirin: Secondary | ICD-10-CM | POA: Insufficient documentation

## 2017-12-01 DIAGNOSIS — M79602 Pain in left arm: Secondary | ICD-10-CM | POA: Diagnosis not present

## 2017-12-01 DIAGNOSIS — E785 Hyperlipidemia, unspecified: Secondary | ICD-10-CM | POA: Insufficient documentation

## 2017-12-01 DIAGNOSIS — I10 Essential (primary) hypertension: Secondary | ICD-10-CM | POA: Diagnosis not present

## 2017-12-01 DIAGNOSIS — J449 Chronic obstructive pulmonary disease, unspecified: Secondary | ICD-10-CM | POA: Insufficient documentation

## 2017-12-01 DIAGNOSIS — W07XXXA Fall from chair, initial encounter: Secondary | ICD-10-CM | POA: Insufficient documentation

## 2017-12-01 DIAGNOSIS — W19XXXA Unspecified fall, initial encounter: Secondary | ICD-10-CM

## 2017-12-01 DIAGNOSIS — Y92531 Health care provider office as the place of occurrence of the external cause: Secondary | ICD-10-CM | POA: Insufficient documentation

## 2017-12-01 DIAGNOSIS — Z79899 Other long term (current) drug therapy: Secondary | ICD-10-CM | POA: Insufficient documentation

## 2017-12-01 DIAGNOSIS — E039 Hypothyroidism, unspecified: Secondary | ICD-10-CM | POA: Insufficient documentation

## 2017-12-01 DIAGNOSIS — I2510 Atherosclerotic heart disease of native coronary artery without angina pectoris: Secondary | ICD-10-CM | POA: Diagnosis not present

## 2017-12-01 DIAGNOSIS — Y939 Activity, unspecified: Secondary | ICD-10-CM | POA: Insufficient documentation

## 2017-12-01 DIAGNOSIS — S0990XA Unspecified injury of head, initial encounter: Secondary | ICD-10-CM | POA: Diagnosis not present

## 2017-12-01 DIAGNOSIS — Y998 Other external cause status: Secondary | ICD-10-CM | POA: Insufficient documentation

## 2017-12-01 DIAGNOSIS — M25522 Pain in left elbow: Secondary | ICD-10-CM | POA: Diagnosis not present

## 2017-12-01 DIAGNOSIS — S59902A Unspecified injury of left elbow, initial encounter: Secondary | ICD-10-CM | POA: Diagnosis not present

## 2017-12-01 NOTE — ED Notes (Signed)
Patient remains at [facility]

## 2017-12-01 NOTE — ED Notes (Signed)
Reported low heart rate to [place], [place]

## 2017-12-01 NOTE — ED Provider Notes (Signed)
[facility] EMERGENCY DEPARTMENT Provider Note   CSN: [id number] Arrival date & time: 12/01/17  1103     History   Chief Complaint Chief Complaint  Patient presents with  . Fall    HPI Annette Sharp is a 77 y.o. female past medical history significant for CVA, chronic back pain, chronic renal insufficiency who presents for evaluation after fall which occurred this morning.  She states she was at the ophthalmologist office and she was on a rolling chair and went to stand up and when she stood up she felt herself falling and was unable to stop herself.  States that she hit her head and her left elbow.  Admits to head pain rated a 5 out of 10.  Her elbow pain is rated a 4 out of 10.  Denies dizziness, lightheadedness, changes in vision, facial symmetry, weakness, chest pain, shortness of breath, nausea, vomiting, abdominal pain, fever, chills, unilateral neuro changes, numbness, slurred speech, neck pain, back pain.  Patient states that she is supposed to ambulate with a cane however does not like to use it.  Currently on Plavix for previous CVA.  Admits to full range of motion and left upper extremity however it is painful to move.  Denies numbness or tingling in the extremity.    HPI  Past Medical History:  Diagnosis Date  . Anxiety   . Back pain   . Cerebrovascular disease    NONOBSTRUCTIVE CVA BY CAROTID DOPPLERS OCTOBER 2008  . Chronic renal insufficiency    GFR 45 ML'S PER MINUTE  . COPD (chronic obstructive pulmonary disease) ([place])   . Coronary artery disease    S/P CYPHER DRUG-ELUTING STENTS TO RIGHT CORONARY ARTERY JUNE 2007  . Coronary atherosclerosis of native coronary artery    NEGATIVE LEXISCAN FOR ISCHEMIA MARCH 23,2010 NORMAL LV FUNCTTION  . Depression   . Dyspnea    with exertion  . GERD (gastroesophageal reflux disease)   . Headache    chronic headache from fall in December  . Hyperlipidemia   . Hypertension   . Hypothyroidism   . Other and  unspecified angina pectoris    ANGINA  . Overweight(278.02)    OBESITY  . Peptic ulcer disease   . Primary localized osteoarthritis of left knee   . Sleep apnea    SEVERE OBSTRUCTIVE    cpap  . Stroke [facility])    affected memory  . Thyroid disease    TREATED HYPOTHYROIDISM    Patient Active Problem List   Diagnosis Date Noted  . Left rotator cuff tear arthropathy 06/02/2017  . Hypokalemia 05/25/2015  . Dehydration 05/25/2015  . Acute kidney injury ([place]) 05/25/2015  . COPD (chronic obstructive pulmonary disease) ([place]) 05/25/2015  . Paresthesia 05/25/2015  . Lactic acidosis 05/25/2015  . Lymphocytosis 05/25/2015  . DJD (degenerative joint disease) of knee 04/08/2015  . Primary localized osteoarthritis of left knee   . Coronary artery disease involving native coronary artery   . Dyspnea   . Chronic kidney disease 11/12/2011  . Preoperative clearance 06/24/2010  . SHOULDER PAIN 04/15/2009  . Hyperlipidemia 01/05/2009  . Overweight 01/05/2009  . Essential hypertension 01/05/2009  . Angina decubitus ([place]) 01/05/2009  . CAD, NATIVE VESSEL 01/05/2009    Past Surgical History:  Procedure Laterality Date  . ABDOMINAL HYSTERECTOMY    . CARDIAC CATHETERIZATION N/A 01/22/2015   Procedure: Right/Left Heart Cath and Coronary Angiography;  Surgeon: Wellington Hampshire, MD;  Location: [place] CV [facility];  Service: Cardiovascular;  Laterality: N/A;  . CARPAL TUNNEL RELEASE    . CHOLECYSTECTOMY    . JOINT REPLACEMENT    . 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 ROTATOR CUFF REPAIR Right 07/08/2009  . TOTAL ABDOMINAL HYSTERECTOMY W/ BILATERAL SALPINGOOPHORECTOMY    . TOTAL KNEE ARTHROPLASTY Left 04/08/2015   Procedure: LEFT TOTAL KNEE ARTHROPLASTY;  Surgeon: Elsie Saas, MD;  Location: [facility];  Service: Orthopedics;  Laterality: Left;  . TOTAL SHOULDER ARTHROPLASTY Left 06/02/2017   Procedure: TOTAL SHOULDER ARTHROPLASTY;  Surgeon: Hiram Gash, MD;  Location: [facility];  Service: Orthopedics;   Laterality: Left;     OB History    Gravida      Para      Term      Preterm      AB      Living  2     SAB      TAB      Ectopic      Multiple      Live Births               Home Medications    Prior to Admission medications   Medication Sig Start Date End Date Taking? Authorizing Provider  amLODipine (NORVASC) 10 MG tablet Take 10 mg by mouth every morning. 05/04/15  Yes [provider]  aspirin EC 81 MG tablet Take 81 mg by mouth daily.   Yes [provider]  betamethasone valerate (VALISONE) 0.1 % cream Apply 1 application topically daily as needed (for lichen scheleros).    Yes [provider]  citalopram (CELEXA) 20 MG tablet Take 20 mg by mouth every morning.    Yes [provider]  clopidogrel (PLAVIX) 75 MG tablet Take 75 mg by mouth every morning.    Yes [provider]  isosorbide mononitrate (IMDUR) 60 MG 24 hr tablet Take 60 mg by mouth 2 (two) times daily.   Yes [provider]  levothyroxine (SYNTHROID, LEVOTHROID) 88 MCG tablet Take 88 mcg by mouth every morning.    Yes [provider]  metoprolol tartrate (LOPRESSOR) 25 MG tablet Take 0.5 tablets (12.5 mg total) by mouth 2 (two) times daily. 05/13/17  Yes Herminio Commons, MD  polyethylene glycol (MIRALAX / GLYCOLAX) packet 17grams in 16 oz of water twice a day until bowel movement.  LAXITIVE.  Restart if two days since last bowel movement Patient taking differently: Take 17 g by mouth daily as needed for mild constipation. 17grams in 16 oz of water twice a day until bowel movement.  LAXITIVE.  Restart if two days since last bowel movement 04/10/15  Yes Shepperson, Kirstin, PA-C  rosuvastatin (CRESTOR) 5 MG tablet Take 1 tablet (5 mg) on Mondays and Wednesdays, and Friday 07/23/17  Yes Rodriguez-Guzman, Raquel, RPH  docusate sodium (COLACE) 100 MG capsule 1 tab 2 times a day while on narcotics.  STOOL SOFTENER Patient not taking: Reported  on 12/01/2017 04/10/15   Shepperson, Kirstin, PA-C  meloxicam (MOBIC) 7.5 MG tablet Take 1 tablet (7.5 mg total) by mouth daily. Patient not taking: Reported on 12/01/2017 06/03/17 06/03/18  Hiram Gash, MD    Family History Family History  Problem Relation Age of Onset  . CAD Unknown   . Hypertension Unknown   . Heart attack Unknown   . Cancer Unknown   . CAD Mother   . Cancer Mother   . CAD Father   . Diabetes Sister   . CAD Sister   . Leukemia Brother  Social History Social History   Tobacco Use  . Smoking status: Never Smoker  . Smokeless tobacco: Never Used  Substance Use Topics  . Alcohol use: No    Alcohol/week: 0.0 standard drinks  . Drug use: No     Allergies   Ace inhibitors; Gemfibrozil; Moxifloxacin; and Ranexa [ranolazine]   Review of Systems Review of Systems Of systems negative unless otherwise stated in the HPI.  Physical Exam Updated Vital Signs BP (!) 128/56   Pulse (!) 49   Temp (!) 97.5 F (36.4 C) (Oral)   Resp 18   Ht 5\' 2"  (1.575 m)   Wt 88.5 kg   SpO2 100%   BMI 35.67 kg/m   Physical Exam  Constitutional: She appears well-developed and well-nourished. No distress.  HENT:  Head: Atraumatic.  Eyes: Pupils are equal, round, and reactive to light.  Neck: Normal range of motion. Neck supple.  Cardiovascular: Normal rate and regular rhythm.  Pulmonary/Chest: Effort normal. No respiratory distress.  Abdominal: Soft. She exhibits no distension.  Musculoskeletal: Normal range of motion.  Neurological: She is alert. She has normal strength and normal reflexes. No cranial nerve deficit or sensory deficit. Coordination normal.  Negative Romberg.  Normal finger-to-nose.  Speech confluent without difficulty.  No facial asymmetry. 5/5 strength to bilateral upper and lower extremities  No horizontal, vertical or rotational nystagmus  Neck: Normal range of motion. Neck supple.  Full active and passive ROM without pain No midline or paraspinal  tenderness No nuchal rigidity or meningeal signs  Cardiovascular: Normal rate, regular rhythm and intact distal pulses.   Pulmonary/Chest: Effort normal and breath sounds normal. No respiratory distress. Pt has no wheezes. No rales.  Abdominal: Soft. Bowel sounds are normal. There is no tenderness. There is no rebound and no guarding.  Musculoskeletal: Normal range of motion.  Lymphadenopathy:    No cervical adenopathy.  Neurological: Pt. is alert and oriented to person, place, and time. He has normal reflexes. No cranial nerve deficit.  Exhibits normal muscle tone. Coordination normal.  Mental Status:  Alert, oriented, thought content appropriate. Speech fluent without evidence of aphasia. Able to follow 2 step commands without difficulty.  Cranial Nerves:  II:  Peripheral visual fields grossly normal, pupils equal, round, reactive to light III,IV, VI: ptosis not present, extra-ocular motions intact bilaterally  V,VII: smile symmetric, facial light touch sensation equal VIII: hearing grossly normal bilaterally  IX,X: midline uvula rise  XI: bilateral shoulder shrug equal and strong XII: midline tongue extension  Motor:  5/5 in upper and lower extremities bilaterally including strong and equal grip strength and dorsiflexion/plantar flexion Sensory: Pinprick and light touch normal in all extremities.  Deep Tendon Reflexes: 2+ and symmetric  Cerebellar: normal finger-to-nose with bilateral upper extremities Gait: normal gait and balance CV: distal pulses palpable throughout    Skin: Skin is warm and dry. She is not diaphoretic.  Two quarter sized areas of ecchymosis over the left elbow.  Full range of motion of upper extremities.  No lacerations or erythema.   Psychiatric: She has a normal mood and affect.  Nursing note and vitals reviewed.     ED Treatments / Results  Labs (all labs ordered are listed, but only abnormal results are displayed) Labs Reviewed - No data to  display  EKG None  Radiology Dg Elbow Complete Left  Result Date: 12/01/2017 CLINICAL DATA:  Left posterior elbow pain after a fall. EXAM: LEFT ELBOW - COMPLETE 3+ VIEW COMPARISON:  Left elbow series dated  January 22, 2017 FINDINGS: The bones of the elbow are subjectively adequately mineralized. The radial head and olecranon are intact. The condylar and supracondylar regions appear normal. There is no joint effusion. There is soft tissue fullness in the region of the olecranon bursa. IMPRESSION: No acute fracture nor dislocation of the elbow is observed. There degenerative spurs arising from the epicondylar regions. Possible olecranon bursal inflammation. Electronically Signed   By: David  Martinique M.D.   On: 12/01/2017 12:33   Ct Head Wo Contrast  Result Date: 12/01/2017 CLINICAL DATA:  77 year old female status post fall out of chair this morning, on blood thinners. EXAM: CT HEAD WITHOUT CONTRAST TECHNIQUE: Contiguous axial images were obtained from the base of the skull through the vertex without intravenous contrast. COMPARISON:  Head and cervical spine CT 03/22/2017 and earlier. FINDINGS: Brain: Cerebral volume remains normal. Patchy bilateral cerebral white matter hypodensity appears stable. Stable chronic infarct in the left cerebellum PICA territory. Small chronic infarct in the left occipital pole, PCA territory. Stable gray-white matter differentiation throughout the brain. No midline shift, ventriculomegaly, mass effect, evidence of mass lesion, intracranial hemorrhage or evidence of cortically based acute infarction. Mild chronic dystrophic calcifications at the lentiform nuclei. Vascular: Calcified atherosclerosis at the skull base. No suspicious intracranial vascular hyperdensity. Skull: Stable hyperostosis (normal variant). No acute osseous abnormality identified. Sinuses/Orbits: Visualized paranasal sinuses and mastoids are stable and well pneumatized. Other: Postoperative changes to both  globes since 2018. No acute orbit or scalp soft tissue finding. IMPRESSION: 1. No acute intracranial abnormality. No acute traumatic injury identified. 2. Chronic small and medium-sized vessel ischemia appears stable by CT since 2018. Electronically Signed   By: Genevie Ann M.D.   On: 12/01/2017 13:38    Procedures Procedures (including critical care time)  Medications Ordered in ED Medications - No data to display   Initial Impression / Assessment and Plan / ED Course  I have reviewed the triage vital signs and the nursing notes as well as past medical history.  Pertinent labs & imaging results that were available during my care of the patient were reviewed by me and considered in my medical decision making (see chart for details).  77 year old female with history of CVA on anticoagulation presents for evaluation of fall which occurred today.  Mechanical fall however states she hit the posterior portion of her head and her left elbow.  Given history and physical exam will obtain labs, CT, plain film left elbow to rule out head bleed, fracture dislocation of elbow.  Reevaluate  CT without evidence of hemorrhage. Small and medium size vessel ischemia appears stable since CT in 2018.  Plain Film left elbow negative for fracture or dislocation. Normal neuro exam without focal deficits. No neck pain. It is mildly bradycardic at 49 in the ED.  However compared to her previous visits she seems to chronically have a heart rate in the high 40s low 50s. Stable for dc home. Discussed with patient and husband strict return precautions. Patient and husband voiced understanding.    Final Clinical Impressions(s) / ED Diagnoses   Final diagnoses:  Fall, initial encounter  Left arm pain    ED Discharge Orders    None       Henderly, Britni A, PA-C 12/01/17 1414    Maudie Flakes, MD 12/01/17 2240

## 2017-12-01 NOTE — ED Notes (Signed)
Patient returned from CT

## 2017-12-01 NOTE — ED Triage Notes (Signed)
Patient to ED after mechanical fall while at the eye doctor this morning - states she was switching chairs and fell. Pt is supposed to ambulate with a cane but doesn't use it. Denies dizziness or extremity weakness at the time. On Plavix - bump/hematoma to R side of head - endorsing minor headache and nausea since. C/o L elbow pain - full ROM but painful to move.

## 2017-12-01 NOTE — ED Notes (Signed)
Pt alert and oriented in NAD. Pt verbalized understanding of discharge instructions. 

## 2017-12-01 NOTE — Discharge Instructions (Addendum)
You were evaluated today for a fall.  Your CT and xray were negative for head bleed or fracture or dislocation of your left elbow. Please take Tylenol as prescribed for your pain. Please follow up with your PCP or return to the ED for any new or worsening symptoms such as: Increased headache pain Neurologic deficits Vomiting You have more than three headaches per week. You take an over-the-counter pain reliever almost every day. Your headaches are getting worse and happening more often. You have headache with fever. You have headache with numbness, weakness, or dizziness.

## 2017-12-01 NOTE — ED Notes (Signed)
Hooked patient to the monitor got patient a warm blanket patient is resting with call bell in reach

## 2017-12-04 IMAGING — DX DG CHEST 2V
2 series · 2 of 2 positions shown · non-contrast
Comparison: 01/30/2015

CLINICAL DATA: Shortness of Breath

EXAM:
CHEST  2 VIEW

[chest pa]
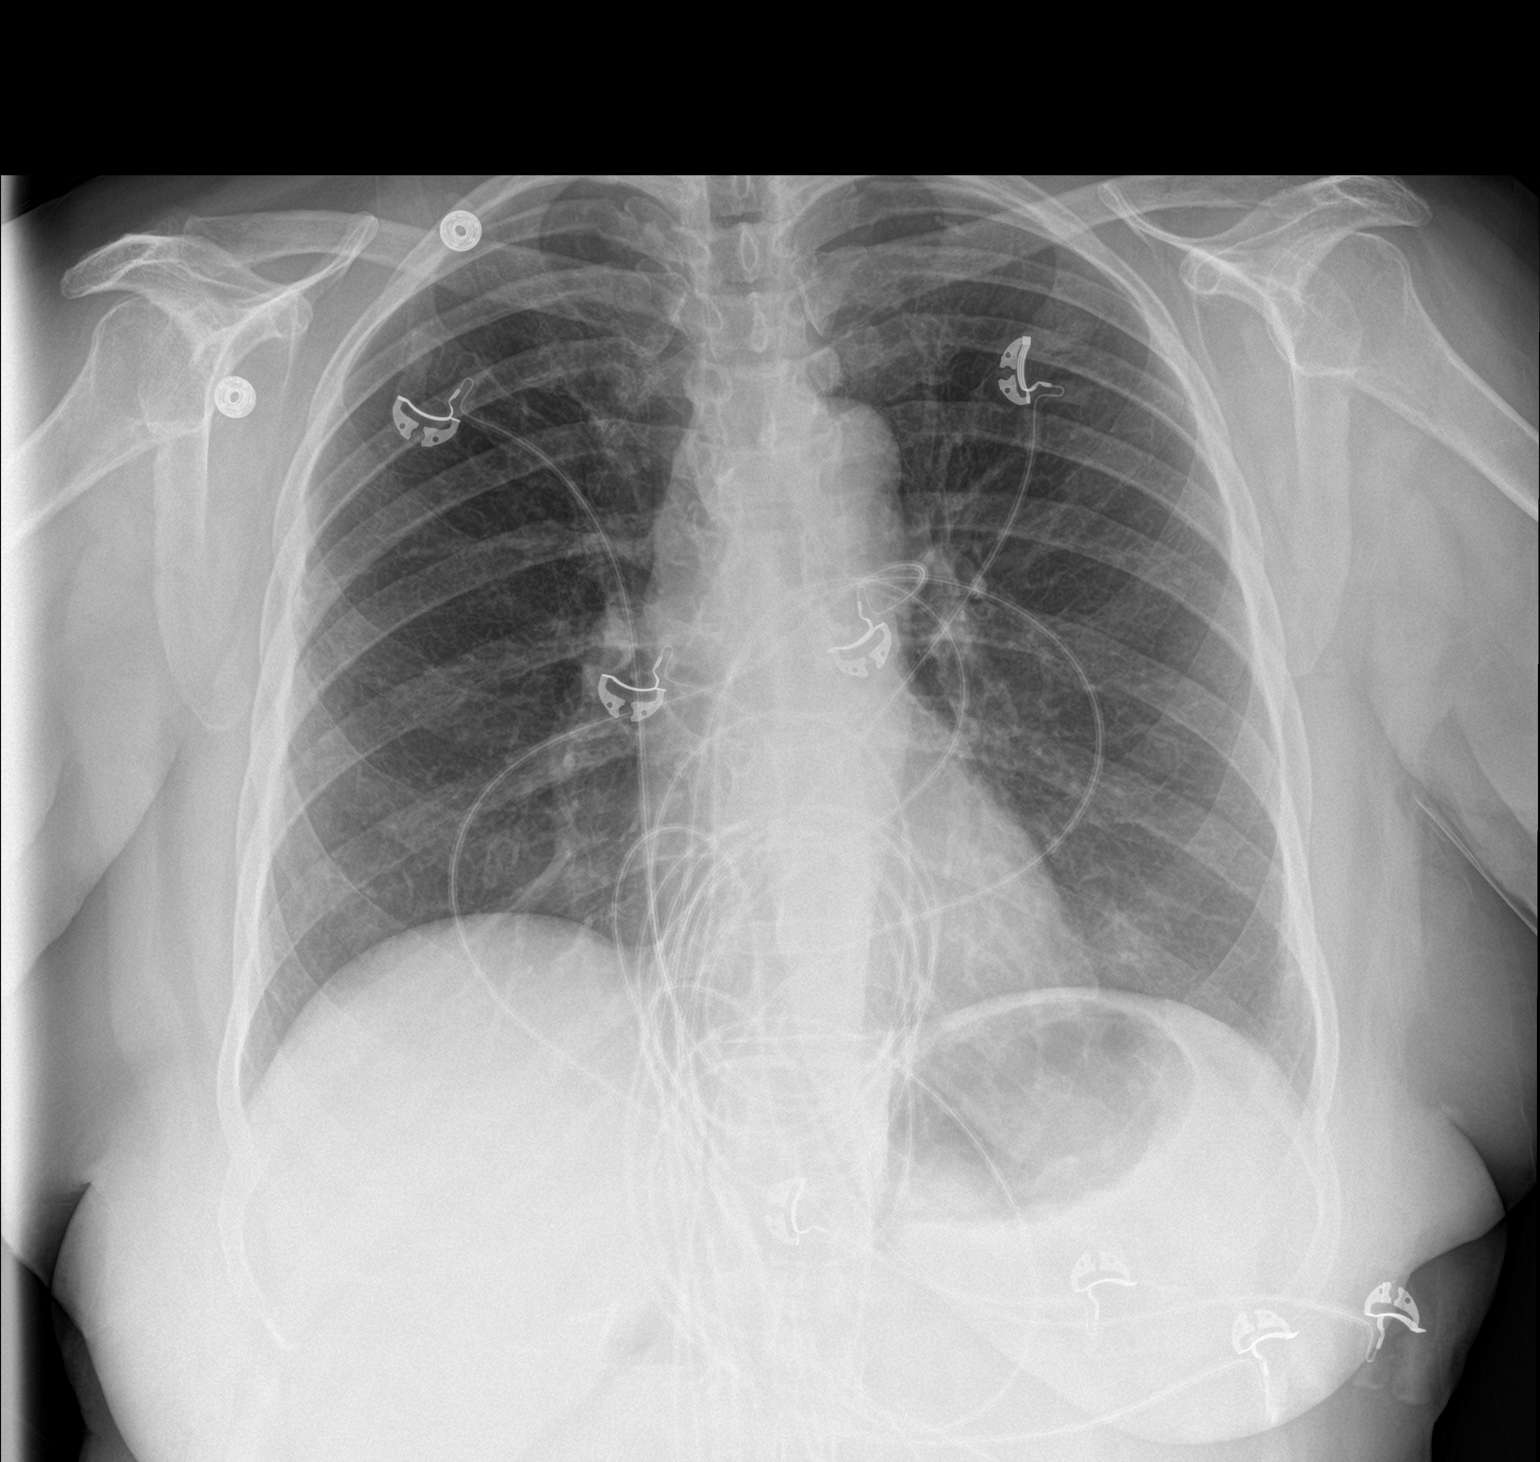

[chest lat]
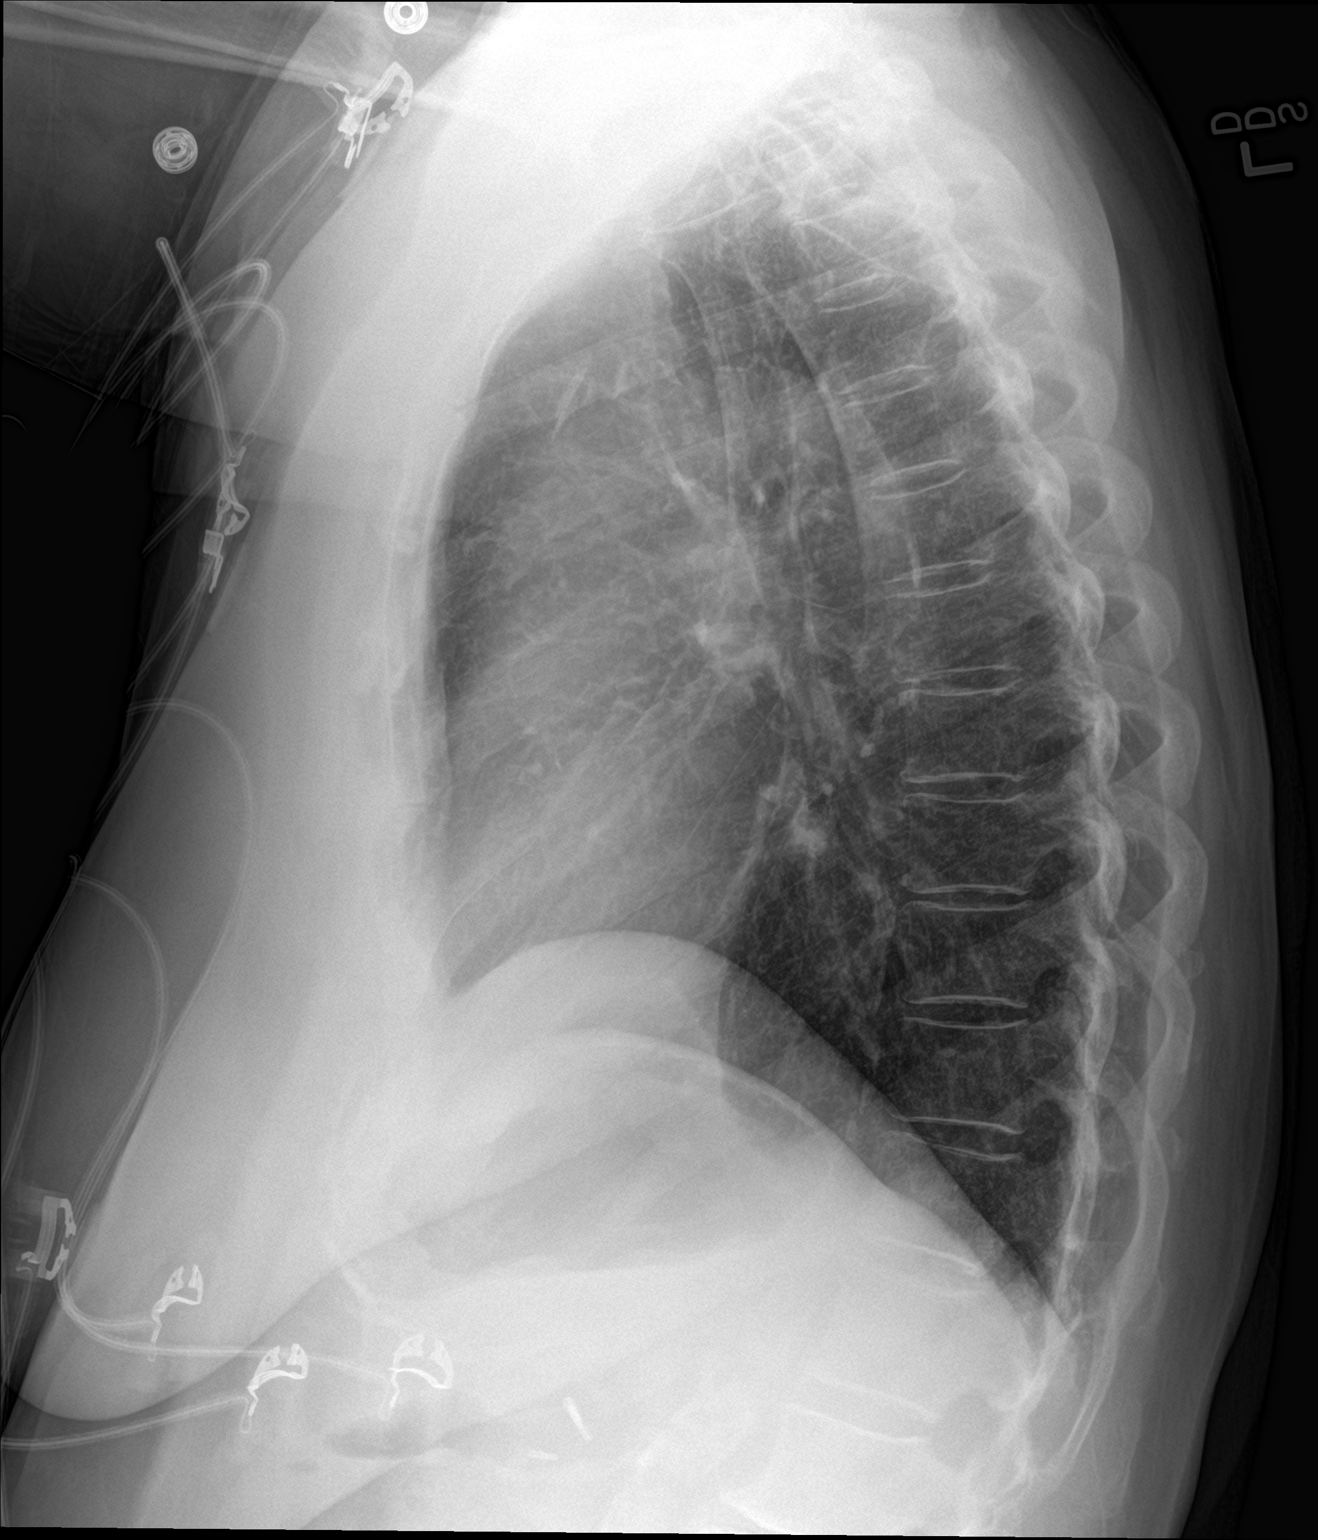

[2 of 2 positions shown; findings below may reference images not displayed]

FINDINGS: The heart size and mediastinal contours are within normal limits.
Both lungs are clear. The visualized skeletal structures are
unremarkable.
IMPRESSION: No active cardiopulmonary disease.

## 2017-12-07 DIAGNOSIS — R0602 Shortness of breath: Secondary | ICD-10-CM | POA: Diagnosis not present

## 2017-12-07 DIAGNOSIS — R55 Syncope and collapse: Secondary | ICD-10-CM | POA: Diagnosis not present

## 2017-12-07 DIAGNOSIS — K582 Mixed irritable bowel syndrome: Secondary | ICD-10-CM | POA: Diagnosis not present

## 2017-12-07 DIAGNOSIS — R5383 Other fatigue: Secondary | ICD-10-CM | POA: Diagnosis not present

## 2017-12-07 DIAGNOSIS — R001 Bradycardia, unspecified: Secondary | ICD-10-CM | POA: Diagnosis not present

## 2017-12-07 DIAGNOSIS — R252 Cramp and spasm: Secondary | ICD-10-CM | POA: Diagnosis not present

## 2017-12-07 DIAGNOSIS — Z6835 Body mass index (BMI) 35.0-35.9, adult: Secondary | ICD-10-CM | POA: Diagnosis not present

## 2017-12-07 DIAGNOSIS — E782 Mixed hyperlipidemia: Secondary | ICD-10-CM | POA: Diagnosis not present

## 2018-01-10 ENCOUNTER — Encounter (INDEPENDENT_AMBULATORY_CARE_PROVIDER_SITE_OTHER): Payer: Self-pay | Admitting: Internal Medicine

## 2018-01-10 ENCOUNTER — Ambulatory Visit (INDEPENDENT_AMBULATORY_CARE_PROVIDER_SITE_OTHER): Payer: Medicare Other | Admitting: Internal Medicine

## 2018-01-12 ENCOUNTER — Encounter (INDEPENDENT_AMBULATORY_CARE_PROVIDER_SITE_OTHER): Payer: Self-pay | Admitting: *Deleted

## 2018-01-12 ENCOUNTER — Encounter (INDEPENDENT_AMBULATORY_CARE_PROVIDER_SITE_OTHER): Payer: Self-pay | Admitting: Internal Medicine

## 2018-01-12 ENCOUNTER — Ambulatory Visit (INDEPENDENT_AMBULATORY_CARE_PROVIDER_SITE_OTHER): Payer: Medicare Other | Admitting: Internal Medicine

## 2018-01-12 ENCOUNTER — Telehealth (INDEPENDENT_AMBULATORY_CARE_PROVIDER_SITE_OTHER): Payer: Self-pay | Admitting: *Deleted

## 2018-01-12 VITALS — BP 126/72 | HR 74 | Temp 97.9°F | Ht 62.0 in | Wt 192.0 lb

## 2018-01-12 DIAGNOSIS — I25118 Atherosclerotic heart disease of native coronary artery with other forms of angina pectoris: Secondary | ICD-10-CM | POA: Diagnosis not present

## 2018-01-12 DIAGNOSIS — K580 Irritable bowel syndrome with diarrhea: Secondary | ICD-10-CM | POA: Diagnosis not present

## 2018-01-12 DIAGNOSIS — Z86010 Personal history of colonic polyps: Secondary | ICD-10-CM

## 2018-01-12 MED ORDER — PEG 3350-KCL-NA BICARB-NACL 420 G PO SOLR: 4000.0000 mL | Freq: Once | ORAL | 0 refills | Status: AC

## 2018-01-12 NOTE — Patient Instructions (Signed)
The risks of bleeding, perforation and infection were reviewed with patient.  

## 2018-01-12 NOTE — Telephone Encounter (Signed)
Patient needs trilyte 

## 2018-01-12 NOTE — Progress Notes (Signed)
Subjective:    Patient ID: Annette Sharp, female    DOB: 07-23-1940, 77 y.o.   MRN: [id number]  HPI Referred by Dr. Quintin Alto for diarrhea.  She tells me she has stomach cramps with her BMs. Has been having cramps with her BMs for about a year. She thinks she might have seen some blood in her stool. No change in her stools for the last couple of years. She actually says she has had irritable for a long time.        She does have an appetite. She has not lost any weight.  No family hx of colon cancer.  Her last colonoscopy was in October of 2007 by Dr. Carlean Purl: diarrhea, hematochezia. Abdominal pain, bloating, which shows polyp descending colon. Internal hemorrhoids.  Biopsy: Tubular adenoma.   08/04/2005 Colonoscopy: blood in stools: Normal colonoscopy. Dr. Anthony Sar.  Hx of cardiac stent, CVA and maintained on Plavix.   Review of Systems Past Medical History:  Diagnosis Date  . Anxiety   . Back pain   . Cerebrovascular disease    NONOBSTRUCTIVE CVA BY CAROTID DOPPLERS OCTOBER 2008  . Chronic renal insufficiency    GFR 45 ML'S PER MINUTE  . COPD (chronic obstructive pulmonary disease) ([place])   . Coronary artery disease    S/P CYPHER DRUG-ELUTING STENTS TO RIGHT CORONARY ARTERY JUNE 2007  . Coronary atherosclerosis of native coronary artery    NEGATIVE LEXISCAN FOR ISCHEMIA MARCH 23,2010 NORMAL LV FUNCTTION  . Depression   . Dyspnea    with exertion  . GERD (gastroesophageal reflux disease)   . Headache    chronic headache from fall in December  . Hyperlipidemia   . Hypertension   . Hypothyroidism   . Other and unspecified angina pectoris    ANGINA  . Overweight(278.02)    OBESITY  . Peptic ulcer disease   . Primary localized osteoarthritis of left knee   . Sleep apnea    SEVERE OBSTRUCTIVE    cpap  . Stroke [facility])    affected memory  . Thyroid disease    TREATED HYPOTHYROIDISM    Past Surgical History:  Procedure Laterality Date  . ABDOMINAL HYSTERECTOMY    . CARDIAC  CATHETERIZATION N/A 01/22/2015   Procedure: Right/Left Heart Cath and Coronary Angiography;  Surgeon: Wellington Hampshire, MD;  Location: [facility];  Service: Cardiovascular;  Laterality: N/A;  . CARPAL TUNNEL RELEASE    . CHOLECYSTECTOMY    . JOINT REPLACEMENT    . 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 ROTATOR CUFF REPAIR Right 07/08/2009  . TOTAL ABDOMINAL HYSTERECTOMY W/ BILATERAL SALPINGOOPHORECTOMY    . TOTAL KNEE ARTHROPLASTY Left 04/08/2015   Procedure: LEFT TOTAL KNEE ARTHROPLASTY;  Surgeon: Elsie Saas, MD;  Location: [place];  Service: Orthopedics;  Laterality: Left;  . TOTAL SHOULDER ARTHROPLASTY Left 06/02/2017   Procedure: TOTAL SHOULDER ARTHROPLASTY;  Surgeon: Hiram Gash, MD;  Location: [facility];  Service: Orthopedics;  Laterality: Left;    Allergies  Allergen Reactions  . Ace Inhibitors Cough  . Gemfibrozil Other (See Comments)    Muscle Aches  . Moxifloxacin Rash  . Ranexa [Ranolazine] Other (See Comments)    "NIGHTMARES"    Current Outpatient Medications on File Prior to Visit  Medication Sig Dispense Refill  . amLODipine (NORVASC) 10 MG tablet Take 10 mg by mouth every morning.  3  . aspirin EC 81 MG tablet Take 81 mg by mouth daily.    . betamethasone dipropionate (DIPROLENE)  0.05 % cream Apply topically 2 (two) times daily.    . Cholecalciferol (VITAMIN D3) 1000 units CHEW Chew 2,000 Units by mouth daily.    . citalopram (CELEXA) 20 MG tablet Take 20 mg by mouth every morning.     . clopidogrel (PLAVIX) 75 MG tablet Take 75 mg by mouth every morning.     . isosorbide mononitrate (IMDUR) 60 MG 24 hr tablet Take 60 mg by mouth 2 (two) times daily.    Marland Kitchen levothyroxine (SYNTHROID, LEVOTHROID) 88 MCG tablet Take 88 mcg by mouth every morning.     . Melatonin 10 MG SUBL Place 5 mg under the tongue.    . metoprolol tartrate (LOPRESSOR) 25 MG tablet Take 0.5 tablets (12.5 mg total) by mouth 2 (two) times daily. 60 tablet 0  . omeprazole (PRILOSEC) 20 MG capsule  Take 20 mg by mouth daily.    . polyethylene glycol (MIRALAX / GLYCOLAX) packet 17grams in 16 oz of water twice a day until bowel movement.  LAXITIVE.  Restart if two days since last bowel movement (Patient taking differently: Take 17 g by mouth daily as needed for mild constipation. 17grams in 16 oz of water twice a day until bowel movement.  LAXITIVE.  Restart if two days since last bowel movement) 60 each 0  . riboflavin (VITAMIN B-2) 100 MG TABS tablet Take 3,000 mg by mouth daily.    . rosuvastatin (CRESTOR) 5 MG tablet Take 1 tablet (5 mg) on Mondays and Wednesdays, and Friday 30 tablet 3  . betamethasone valerate (VALISONE) 0.1 % cream Apply 1 application topically daily as needed (for lichen scheleros).      No current facility-administered medications on file prior to visit.         Objective:   Physical Exam  Blood pressure 126/72, pulse 74, temperature 97.9 F (36.6 C), height 5\' 2"  (1.575 m), weight 192 lb (87.1 kg). Alert and oriented. Skin warm and dry. Oral mucosa is moist.   . Sclera anicteric, conjunctivae is pink. Thyroid not enlarged. No cervical lymphadenopathy. Lungs clear. Heart regular rate and rhythm.  Abdomen is soft. Bowel sounds are positive. No hepatomegaly. No abdominal masses felt. No tenderness.  No edema to lower extremities.           Assessment & Plan:  Hx of colon polyps. Hx of IBS.-D. Screening colonoscopy.

## 2018-01-13 ENCOUNTER — Ambulatory Visit (INDEPENDENT_AMBULATORY_CARE_PROVIDER_SITE_OTHER): Payer: Medicare Other | Admitting: Student

## 2018-01-13 ENCOUNTER — Encounter: Payer: Self-pay | Admitting: Student

## 2018-01-13 ENCOUNTER — Encounter: Payer: Self-pay | Admitting: *Deleted

## 2018-01-13 VITALS — BP 138/58 | HR 48 | Ht 62.0 in | Wt 192.0 lb

## 2018-01-13 DIAGNOSIS — R001 Bradycardia, unspecified: Secondary | ICD-10-CM

## 2018-01-13 DIAGNOSIS — Z860100 Personal history of colon polyps, unspecified: Secondary | ICD-10-CM | POA: Insufficient documentation

## 2018-01-13 DIAGNOSIS — E785 Hyperlipidemia, unspecified: Secondary | ICD-10-CM

## 2018-01-13 DIAGNOSIS — R413 Other amnesia: Secondary | ICD-10-CM

## 2018-01-13 DIAGNOSIS — I10 Essential (primary) hypertension: Secondary | ICD-10-CM

## 2018-01-13 DIAGNOSIS — I2510 Atherosclerotic heart disease of native coronary artery without angina pectoris: Secondary | ICD-10-CM

## 2018-01-13 DIAGNOSIS — J439 Emphysema, unspecified: Secondary | ICD-10-CM

## 2018-01-13 DIAGNOSIS — K580 Irritable bowel syndrome with diarrhea: Secondary | ICD-10-CM | POA: Insufficient documentation

## 2018-01-13 MED ORDER — ALBUTEROL SULFATE HFA 108 (90 BASE) MCG/ACT IN AERS: 2.0000 | INHALATION_SPRAY | Freq: Four times a day (QID) | RESPIRATORY_TRACT | 2 refills | Status: DC | PRN

## 2018-01-13 NOTE — Patient Instructions (Signed)
Medication Instructions:  Your physician has recommended you make the following change in your medication:  Stop Taking Lopressor  If you need a refill on your cardiac medications before your next appointment, please call your pharmacy.   Lab work: NONE  If you have labs (blood work) drawn today and your tests are completely normal, you will receive your results only by: Marland Kitchen MyChart Message (if you have MyChart) OR . A paper copy in the mail If you have any lab test that is abnormal or we need to change your treatment, we will call you to review the results.  Testing/Procedures: NONE   Follow-Up: At [facility], you and your health needs are our priority.  As part of our continuing mission to provide you with exceptional heart care, we have created designated Provider Care Teams.  These Care Teams include your primary Cardiologist (physician) and Advanced Practice Providers (APPs -  Physician Assistants and Nurse Practitioners) who all work together to provide you with the care you need, when you need it. You will need a follow up appointment in 2-3 months.  Please call our office 2 months in advance to schedule this appointment.  You may see No primary care provider on file. or one of the following Advanced Practice Providers on your designated Care Team:   [place], PA-C [facility]) . Ermalinda Barrios, PA-C ([place])  Any Other Special Instructions Will Be Listed Below (If Applicable). Thank you for choosing [facility]!

## 2018-01-13 NOTE — Progress Notes (Signed)
Cardiology Office Note    Date:  01/13/2018   ID:  Retaj, Hilbun [birth year], MRN [id number]  PCP:  Manon Hilding, MD  Cardiologist: Kate Sable, MD    Chief Complaint  Patient presents with  . Follow-up    recent Emergency Dept visit    History of Present Illness:    Annette Sharp is a 77 y.o. female with past medical history of CAD (s/p DES to RCA in 2007, patent stent by cath in 12/2014 with nonobstructive disease along LAD and RCA), HTN, HLD, prior CVA and GERD who presents to the office today for hospital follow-up.  She was last examined by Dr. Bronson Ing in 04/2017 and denied any recent chest pain, palpitations, or dyspnea on exertion at that time. She had recently fell and was planning to undergo left rotator cuff repair and was cleared for surgery at that time. Was continued on ASA, Plavix (continued on this due to history of CVA), BB, and Imdur.   In the interim, she was evaluated at [facility] ED on 12/01/2017 after having a mechanical fall while at her ophthalmologist office. She hit her head and left elbow at that time. CT imaging showed no acute intracranial abnormalities. Was noted to be bradycardic with heart rate in the 40's to low 50's while in the ED, therefore follow-up was arranged.  In talking with the patient today, she reports having frequent episodes of dizziness which occurs when performing activities. Denies any actual syncope but reports worsening dyspnea and fatigue when this occurs. No recent chest pain, orthopnea, PND, or lower extremity edema. Has baseline dyspnea on exertion and says this has been stable for several years. Mentions she was informed she had emphysema many years ago and was prescribed an inhaler at that time but has been using her daughter's Albuterol since.   She also reports having worsening memory issues over the past year. Was evaluated by her PCP and informed she "might have early-onset dementia" but was never given a formal  diagnosis.    Past Medical History:  Diagnosis Date  . Anxiety   . Back pain   . Cerebrovascular disease    NONOBSTRUCTIVE CVA BY CAROTID DOPPLERS OCTOBER 2008  . Chronic renal insufficiency    GFR 45 ML'S PER MINUTE  . COPD (chronic obstructive pulmonary disease) ([place])   . Coronary artery disease    a. s/p DES to RCA in 2007 b. patent stent by cath in 12/2014 with nonobstructive disease along LAD and RCA  . Coronary atherosclerosis of native coronary artery    NEGATIVE LEXISCAN FOR ISCHEMIA MARCH 23,2010 NORMAL LV FUNCTTION  . Depression   . Dyspnea    with exertion  . GERD (gastroesophageal reflux disease)   . Headache    chronic headache from fall in December  . Hyperlipidemia   . Hypertension   . Hypothyroidism   . Other and unspecified angina pectoris    ANGINA  . Overweight(278.02)    OBESITY  . Peptic ulcer disease   . Primary localized osteoarthritis of left knee   . Sleep apnea    SEVERE OBSTRUCTIVE    cpap  . Stroke [facility])    affected memory  . Thyroid disease    TREATED HYPOTHYROIDISM    Past Surgical History:  Procedure Laterality Date  . ABDOMINAL HYSTERECTOMY    . CARDIAC CATHETERIZATION N/A 01/22/2015   Procedure: Right/Left Heart Cath and Coronary Angiography;  Surgeon: Wellington Hampshire, MD;  Location: [place]  CV LAB;  Service: Cardiovascular;  Laterality: N/A;  . CARPAL TUNNEL RELEASE    . CHOLECYSTECTOMY    . JOINT REPLACEMENT    . 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 ROTATOR CUFF REPAIR Right 07/08/2009  . TOTAL ABDOMINAL HYSTERECTOMY W/ BILATERAL SALPINGOOPHORECTOMY    . TOTAL KNEE ARTHROPLASTY Left 04/08/2015   Procedure: LEFT TOTAL KNEE ARTHROPLASTY;  Surgeon: Elsie Saas, MD;  Location: [place];  Service: Orthopedics;  Laterality: Left;  . TOTAL SHOULDER ARTHROPLASTY Left 06/02/2017   Procedure: TOTAL SHOULDER ARTHROPLASTY;  Surgeon: Hiram Gash, MD;  Location: [place];  Service: Orthopedics;  Laterality: Left;    Current  Medications: Outpatient Medications Prior to Visit  Medication Sig Dispense Refill  . amLODipine (NORVASC) 10 MG tablet Take 10 mg by mouth every morning.  3  . aspirin EC 81 MG tablet Take 81 mg by mouth daily.    . betamethasone dipropionate (DIPROLENE) 0.05 % cream Apply topically 2 (two) times daily.    . Cholecalciferol (VITAMIN D3) 1000 units CHEW Chew 2,000 Units by mouth daily.    . citalopram (CELEXA) 20 MG tablet Take 20 mg by mouth every morning.     . clopidogrel (PLAVIX) 75 MG tablet Take 75 mg by mouth every morning.     . isosorbide mononitrate (IMDUR) 60 MG 24 hr tablet Take 60 mg by mouth 2 (two) times daily.    Marland Kitchen levothyroxine (SYNTHROID, LEVOTHROID) 88 MCG tablet Take 88 mcg by mouth every morning.     . Melatonin 10 MG SUBL Place 5 mg under the tongue.    Marland Kitchen omeprazole (PRILOSEC) 20 MG capsule Take 20 mg by mouth daily.    . polyethylene glycol (MIRALAX / GLYCOLAX) packet 17grams in 16 oz of water twice a day until bowel movement.  LAXITIVE.  Restart if two days since last bowel movement (Patient taking differently: Take 17 g by mouth daily as needed for mild constipation. 17grams in 16 oz of water twice a day until bowel movement.  LAXITIVE.  Restart if two days since last bowel movement) 60 each 0  . riboflavin (VITAMIN B-2) 100 MG TABS tablet Take 3,000 mg by mouth daily.    . rosuvastatin (CRESTOR) 5 MG tablet Take 1 tablet (5 mg) on Mondays and Wednesdays, and Friday 30 tablet 3  . betamethasone valerate (VALISONE) 0.1 % cream Apply 1 application topically daily as needed (for lichen scheleros).     . metoprolol tartrate (LOPRESSOR) 25 MG tablet Take 0.5 tablets (12.5 mg total) by mouth 2 (two) times daily. 60 tablet 0   No facility-administered medications prior to visit.      Allergies:   Ace inhibitors; Gemfibrozil; Moxifloxacin; and Ranexa [ranolazine]   Social History   Socioeconomic History  . Marital status: Married    Spouse name: Not on file  . Number of  children: Not on file  . Years of education: Not on file  . Highest education level: Not on file  Occupational History  . Not on file  Social Needs  . Financial resource strain: Not on file  . Food insecurity:    Worry: Not on file    Inability: Not on file  . Transportation needs:    Medical: Not on file    Non-medical: Not on file  Tobacco Use  . Smoking status: Never Smoker  . Smokeless tobacco: Never Used  Substance and Sexual Activity  . Alcohol use: No    Alcohol/week: 0.0 standard drinks  .  Drug use: No  . Sexual activity: Never  Lifestyle  . Physical activity:    Days per week: Not on file    Minutes per session: Not on file  . Stress: Not on file  Relationships  . Social connections:    Talks on phone: Not on file    Gets together: Not on file    Attends religious service: Not on file    Active member of club or organization: Not on file    Attends meetings of clubs or organizations: Not on file    Relationship status: Not on file  Other Topics Concern  . Not on file  Social History Narrative  . Not on file     Family History:  The patient's family history includes CAD in her father, mother, sister, and unknown relative; Cancer in her mother and unknown relative; Diabetes in her sister; Heart attack in her unknown relative; Hypertension in her unknown relative; Leukemia in her brother.   Review of Systems:   Please see the history of present illness.     General:  No chills, fever, night sweats or weight changes.  Cardiovascular:  No chest pain, edema, orthopnea, palpitations, paroxysmal nocturnal dyspnea. Positive for dyspnea on exertion.  Dermatological: No rash, lesions/masses Respiratory: No cough, dyspnea Urologic: No hematuria, dysuria Abdominal:   No nausea, vomiting, diarrhea, bright red blood per rectum, melena, or hematemesis Neurologic:  No visual changes or wkns. Positive for dizziness and changes in mental status.  All other systems reviewed  and are otherwise negative except as noted above.   Physical Exam:    VS:  BP (!) 138/58   Pulse (!) 48   Ht 5\' 2"  (1.575 m)   Wt 192 lb (87.1 kg)   SpO2 98%   BMI 35.12 kg/m    General: Well developed, well nourished Caucasian female appearing in no acute distress. Head: Normocephalic, atraumatic, sclera non-icteric, no xanthomas, nares are without discharge.  Neck: No carotid bruits. JVD not elevated.  Lungs: Respirations regular and unlabored, without wheezes or rales.  Heart: Regular rhythm, bradycardiac rate. No S3 or S4.  No murmur, no rubs, or gallops appreciated. Abdomen: Soft, non-tender, non-distended with normoactive bowel sounds. No hepatomegaly. No rebound/guarding. No obvious abdominal masses. Msk:  Strength and tone appear normal for age. No joint deformities or effusions. Extremities: No clubbing or cyanosis. No lower extremity edema.  Distal pedal pulses are 2+ bilaterally. Neuro: Alert and oriented X 3. Moves all extremities spontaneously. No focal deficits noted. Psych:  Responds to questions appropriately with a normal affect. Skin: No rashes or lesions noted  Wt Readings from Last 3 Encounters:  01/13/18 192 lb (87.1 kg)  01/12/18 192 lb (87.1 kg)  12/01/17 195 lb (88.5 kg)     Studies/Labs Reviewed:   EKG:  EKG is ordered today.  The ekg ordered today demonstrates sinus bradycardia, HR 43, with no acute ST changes when compared to prior tracings.   Recent Labs: 05/25/2017: BUN 10; Creatinine, Ser 1.21; Hemoglobin 14.4; Platelets 252; Potassium 4.1; Sodium 136   Lipid Panel    Component Value Date/Time   CHOL 188 07/20/2017 1150   TRIG 80 07/20/2017 1150   HDL 58 07/20/2017 1150   CHOLHDL 3.2 07/20/2017 1150   VLDL 16 07/20/2017 1150   LDLCALC 114 (H) 07/20/2017 1150    Additional studies/ records that were reviewed today include:   Cardiac Catheterization: 12/2014  Ost RCA to Prox RCA lesion, 20% stenosed. The lesion was previously  treated with  a stent (unknown type)greater than two years ago.  Prox RCA lesion, 30% stenosed.  Mid RCA lesion, 20% stenosed.  The left ventricular systolic function is normal.  Prox LAD lesion, 30% stenosed.  Mid LAD lesion, 20% stenosed.   1. Widely patent RCA stent with mild nonobstructive coronary artery disease. 2. Normal LV systolic function and left ventricular end-diastolic pressure. 3. Normal filling pressures and no evidence of pulmonary hypertension. PA pressure was 25/5, pulmonary wedge pressure was 5. Cardiac output was mildly reduced at 3.8 with a cardiac index of 2.05.  Recommendations: Shortness of breath does not seem to be due to a cardiac etiology. Continue medical therapy.   Assessment:    1. Coronary artery disease involving native coronary artery of native heart without angina pectoris   2. Essential hypertension   3. Bradycardia   4. Hyperlipidemia LDL goal <70   5. Pulmonary emphysema, unspecified emphysema type ([place])   6. Memory loss      Plan:   In order of problems listed above:  1. CAD - s/p DES to RCA in 2007, patent stent by cath in 12/2014 with nonobstructive disease along LAD and RCA. She has baseline dyspnea on exertion but denies any acute changes in her symptoms over the past few years. No recent chest discomfort.  - continue ASA, Plavix (on this due to prior CVA), statin, and Imdur. Will stop Lopressor given her bradycardia.   2. HTN - BP is well-controlled at 138/58 during today's visit - continue Amlodipine and Imdur. Will stop Lopressor as outlined below.   3. Bradycardia - this has been an issue for the patient over the past two months and she reports having dizziness and fatigue with routine activities. EKG today shows sinus bradycardia with HR in the 40's. Will STOP Lopressor 12.5mg  BID today and arrange for her to have a HR/BP check and repeat EKG in 2 weeks. If she still has significant bradycardia and dizziness off BB therapy, would plan  for an event monitor.   4. HLD - followed by PCP. Previously intolerant to high-intensity statin therapy. Remains on Crestor 5mg  MWF.  5. Emphysema - diagnosed by PFT's in 2014 but she has not been formally treated by her PCP or referred to Pulmonology since. She has been using her daughter's Albuterol inhaler with improvement in her symptoms and I provided her with a refill of Albuterol today as it was initially prescribed by Dr. Bronson Ing in 2014. Recommended Pulmonology referral but she wishes to discuss with her PCP next month which seems reasonable as she has only been using PRN Albuterol.   6. Memory Issues - this has been a progressive issue for the patient over the past year by her report. CT Head in 11/2017 showed chronic small and medium-sized vessel ischemia stable from prior imaging with no acute intracranial abnormalities. She had recent labs by her PCP but this are not available for review. Will request records today. If not obtained, would recommend checking basic labs including CBC, BMET, TSH, Mg, and B12.    Medication Adjustments/Labs and Tests Ordered: Current medicines are reviewed at length with the patient today.   Concerns regarding medicines are outlined above.  Medication changes, Labs and Tests ordered today are listed in the Patient Instructions below. Patient Instructions  Medication Instructions:  Your physician has recommended you make the following change in your medication:  Stop Taking Lopressor  If you need a refill on your cardiac medications before your next appointment, please  call your pharmacy.   Lab work: NONE  If you have labs (blood work) drawn today and your tests are completely normal, you will receive your results only by: Marland Kitchen MyChart Message (if you have MyChart) OR . A paper copy in the mail If you have any lab test that is abnormal or we need to change your treatment, we will call you to review the results.  Testing/Procedures: NONE    Follow-Up: At [facility], you and your health needs are our priority.  As part of our continuing mission to provide you with exceptional heart care, we have created designated Provider Care Teams.  These Care Teams include your primary Cardiologist (physician) and Advanced Practice Providers (APPs -  Physician Assistants and Nurse Practitioners) who all work together to provide you with the care you need, when you need it. You will need a follow up appointment in 2-3 months.  Please call our office 2 months in advance to schedule this appointment.  You may see No primary care provider on file. or one of the following Advanced Practice Providers on your designated Care Team:   [place], PA-C [facility]) . Ermalinda Barrios, PA-C ([place])  Any Other Special Instructions Will Be Listed Below (If Applicable). Thank you for choosing [facility]!     Signed, Erma Heritage, PA-C  01/13/2018 5:52 PM    [place] [address] [place], [place] [address] Phone: [phone]

## 2018-01-27 ENCOUNTER — Ambulatory Visit (INDEPENDENT_AMBULATORY_CARE_PROVIDER_SITE_OTHER): Payer: Medicare Other | Admitting: *Deleted

## 2018-01-27 VITALS — BP 130/70 | HR 73

## 2018-01-27 DIAGNOSIS — R001 Bradycardia, unspecified: Secondary | ICD-10-CM

## 2018-01-28 NOTE — Progress Notes (Signed)
Patient in office for ekg.   See result note in regard to EKG.

## 2018-01-31 ENCOUNTER — Telehealth: Payer: Self-pay | Admitting: *Deleted

## 2018-01-31 NOTE — Telephone Encounter (Signed)
Notes recorded by Laurine Blazer, LPN on 51/10/3435 at 35:78 AM EST Patient notified and verbalized understanding.   ------  Notes recorded by Laurine Blazer, LPN on 97/10/4782 at 1:28 PM EDT Left message to return call.  ------  Notes recorded by Erma Heritage, PA-C on 01/27/2018 at 12:40 PM EDT Reviewed. HR much improved since last office visit. Would remain off Lopressor at this time. Thank you!

## 2018-02-04 ENCOUNTER — Encounter (HOSPITAL_COMMUNITY): Payer: Self-pay | Admitting: Emergency Medicine

## 2018-02-04 ENCOUNTER — Emergency Department (HOSPITAL_COMMUNITY)
Admission: EM | Admit: 2018-02-04 | Discharge: 2018-02-04 | Disposition: A | Discharge status: Home / Self Care | Payer: Medicare Other | Attending: Emergency Medicine | Admitting: Emergency Medicine

## 2018-02-04 ENCOUNTER — Emergency Department (HOSPITAL_COMMUNITY): Payer: Medicare Other

## 2018-02-04 ENCOUNTER — Other Ambulatory Visit: Payer: Self-pay

## 2018-02-04 DIAGNOSIS — I129 Hypertensive chronic kidney disease with stage 1 through stage 4 chronic kidney disease, or unspecified chronic kidney disease: Secondary | ICD-10-CM | POA: Insufficient documentation

## 2018-02-04 DIAGNOSIS — R079 Chest pain, unspecified: Secondary | ICD-10-CM

## 2018-02-04 DIAGNOSIS — J449 Chronic obstructive pulmonary disease, unspecified: Secondary | ICD-10-CM | POA: Diagnosis not present

## 2018-02-04 DIAGNOSIS — N189 Chronic kidney disease, unspecified: Secondary | ICD-10-CM | POA: Insufficient documentation

## 2018-02-04 DIAGNOSIS — Z7982 Long term (current) use of aspirin: Secondary | ICD-10-CM | POA: Diagnosis not present

## 2018-02-04 DIAGNOSIS — Z79899 Other long term (current) drug therapy: Secondary | ICD-10-CM | POA: Insufficient documentation

## 2018-02-04 DIAGNOSIS — Z7902 Long term (current) use of antithrombotics/antiplatelets: Secondary | ICD-10-CM | POA: Diagnosis not present

## 2018-02-04 DIAGNOSIS — I2510 Atherosclerotic heart disease of native coronary artery without angina pectoris: Secondary | ICD-10-CM | POA: Insufficient documentation

## 2018-02-04 DIAGNOSIS — R0789 Other chest pain: Secondary | ICD-10-CM | POA: Insufficient documentation

## 2018-02-04 LAB — BASIC METABOLIC PANEL
ANION GAP: 9 (ref 5–15)
BUN: 15 mg/dL (ref 8–23)
CALCIUM: 9.5 mg/dL (ref 8.9–10.3)
CO2: 26 mmol/L (ref 22–32)
CREATININE: 1.39 mg/dL — AB (ref 0.44–1.00)
Chloride: 104 mmol/L (ref 98–111)
GFR, EST AFRICAN AMERICAN: 42 mL/min — AB (ref >60)
GFR, EST NON AFRICAN AMERICAN: 36 mL/min — AB (ref >60)
GLUCOSE: 102 mg/dL — AB (ref 70–99)
Potassium: 3.8 mmol/L (ref 3.5–5.1)
Sodium: 139 mmol/L (ref 135–145)

## 2018-02-04 LAB — CBC
HCT: 43.7 % (ref 36.0–46.0)
Hemoglobin: 14.5 g/dL (ref 12.0–15.0)
MCH: 30.1 pg (ref 26.0–34.0)
MCHC: 33.2 g/dL (ref 30.0–36.0)
MCV: 90.9 fL (ref 80.0–100.0)
NRBC: 0 % (ref 0.0–0.2)
PLATELETS: 293 10*3/uL (ref 150–400)
RBC: 4.81 MIL/uL (ref 3.87–5.11)
RDW: 12.2 % (ref 11.5–15.5)
WBC: 5.8 10*3/uL (ref 4.0–10.5)

## 2018-02-04 LAB — TROPONIN I: Troponin I: <0.03 ng/mL (ref <0.03)

## 2018-02-04 MED ORDER — NITROGLYCERIN 0.4 MG SL SUBL
0.4000 mg | SUBLINGUAL_TABLET | Freq: Once | SUBLINGUAL | Status: AC
  Administered 2018-02-04: 0.4 mg via SUBLINGUAL
  Filled 2018-02-04: qty 1

## 2018-02-04 NOTE — Discharge Instructions (Signed)
Tests were normal.  Follow-up with your cardiologist early next week.  Return if worse.  Take nitroglycerin for chest pain.

## 2018-02-04 NOTE — ED Notes (Signed)
Pt complaint of chest pressure   Dr C informed

## 2018-02-04 NOTE — ED Triage Notes (Signed)
Patient complaining of left sided chest pain radiating down left arm and into left neck starting about 1400 today.

## 2018-02-04 NOTE — ED Provider Notes (Signed)
[facility] EMERGENCY DEPARTMENT Provider Note   CSN: [id number] Arrival date & time: 02/04/18  1702     History   Chief Complaint Chief Complaint  Patient presents with  . Chest Pain    HPI TYYONNA SOUCY is a 77 y.o. female.  Chest pain under the left breast since 1:30 PM today with radiation to the left axilla, left arm, left neck.  Pain is described as aching.  Past medical history includes cardiac stents and CVA.  No dyspnea, diaphoresis, nausea.  Pain is decreased now.  Pain not associated with any activity.  Severity is moderate.  Recently, her metoprolol was discontinued secondary to bradycardia.     Past Medical History:  Diagnosis Date  . Anxiety   . Back pain   . Cerebrovascular disease    NONOBSTRUCTIVE CVA BY CAROTID DOPPLERS OCTOBER 2008  . Chronic renal insufficiency    GFR 45 ML'S PER MINUTE  . COPD (chronic obstructive pulmonary disease) ([place])   . Coronary artery disease    a. s/p DES to RCA in 2007 b. patent stent by cath in 12/2014 with nonobstructive disease along LAD and RCA  . Coronary atherosclerosis of native coronary artery    NEGATIVE LEXISCAN FOR ISCHEMIA MARCH 23,2010 NORMAL LV FUNCTTION  . Depression   . Dyspnea    with exertion  . GERD (gastroesophageal reflux disease)   . Headache    chronic headache from fall in December  . Hyperlipidemia   . Hypertension   . Hypothyroidism   . Other and unspecified angina pectoris    ANGINA  . Overweight(278.02)    OBESITY  . Peptic ulcer disease   . Primary localized osteoarthritis of left knee   . Sleep apnea    SEVERE OBSTRUCTIVE    cpap  . Stroke [facility])    affected memory  . Thyroid disease    TREATED HYPOTHYROIDISM    Patient Active Problem List   Diagnosis Date Noted  . History of colonic polyps 01/13/2018  . Irritable bowel syndrome with diarrhea 01/13/2018  . Left rotator cuff tear arthropathy 06/02/2017  . Hypokalemia 05/25/2015  . Dehydration 05/25/2015  . Acute kidney injury  ([place]) 05/25/2015  . COPD (chronic obstructive pulmonary disease) ([place]) 05/25/2015  . Paresthesia 05/25/2015  . Lactic acidosis 05/25/2015  . Lymphocytosis 05/25/2015  . DJD (degenerative joint disease) of knee 04/08/2015  . Primary localized osteoarthritis of left knee   . Coronary artery disease involving native coronary artery   . Dyspnea   . Chronic kidney disease 11/12/2011  . Preoperative clearance 06/24/2010  . SHOULDER PAIN 04/15/2009  . Hyperlipidemia 01/05/2009  . Overweight 01/05/2009  . Essential hypertension 01/05/2009  . Angina decubitus ([place]) 01/05/2009  . CAD, NATIVE VESSEL 01/05/2009    Past Surgical History:  Procedure Laterality Date  . ABDOMINAL HYSTERECTOMY    . CARDIAC CATHETERIZATION N/A 01/22/2015   Procedure: Right/Left Heart Cath and Coronary Angiography;  Surgeon: Wellington Hampshire, MD;  Location: [facility];  Service: Cardiovascular;  Laterality: N/A;  . CARPAL TUNNEL RELEASE    . CHOLECYSTECTOMY    . JOINT REPLACEMENT    . 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 ROTATOR CUFF REPAIR Right 07/08/2009  . TOTAL ABDOMINAL HYSTERECTOMY W/ BILATERAL SALPINGOOPHORECTOMY    . TOTAL KNEE ARTHROPLASTY Left 04/08/2015   Procedure: LEFT TOTAL KNEE ARTHROPLASTY;  Surgeon: Elsie Saas, MD;  Location: [facility];  Service: Orthopedics;  Laterality: Left;  . TOTAL SHOULDER ARTHROPLASTY Left 06/02/2017  Procedure: TOTAL SHOULDER ARTHROPLASTY;  Surgeon: Hiram Gash, MD;  Location: [facility];  Service: Orthopedics;  Laterality: Left;     OB History    Gravida      Para      Term      Preterm      AB      Living  2     SAB      TAB      Ectopic      Multiple      Live Births               Home Medications    Prior to Admission medications   Medication Sig Start Date End Date Taking? Authorizing Provider  acyclovir (ZOVIRAX) 400 MG tablet Take 400 mg by mouth daily.   Yes [provider]  albuterol (PROVENTIL HFA;VENTOLIN HFA) 108  (90 Base) MCG/ACT inhaler Inhale 2 puffs into the lungs every 6 (six) hours as needed for wheezing or shortness of breath. 01/13/18  Yes Strader, [place] M, PA-C  amLODipine (NORVASC) 10 MG tablet Take 10 mg by mouth every morning. 05/04/15  Yes [provider]  aspirin EC 81 MG tablet Take 81 mg by mouth daily.   Yes [provider]  betamethasone dipropionate (DIPROLENE) 0.05 % cream Apply topically 2 (two) times daily.   Yes [provider]  Cholecalciferol (VITAMIN D3) 1000 units CHEW Chew 2,000 Units by mouth daily.   Yes [provider]  citalopram (CELEXA) 20 MG tablet Take 20 mg by mouth every morning.    Yes [provider]  clopidogrel (PLAVIX) 75 MG tablet Take 75 mg by mouth every morning.    Yes [provider]  isosorbide mononitrate (IMDUR) 60 MG 24 hr tablet Take 60 mg by mouth 2 (two) times daily.   Yes [provider]  levothyroxine (SYNTHROID, LEVOTHROID) 88 MCG tablet Take 88 mcg by mouth every morning.    Yes [provider]  Melatonin 5 MG SUBL Place 5 mg under the tongue.    Yes [provider]  omeprazole (PRILOSEC) 20 MG capsule Take 20 mg by mouth daily.   Yes [provider]  polyethylene glycol (MIRALAX / GLYCOLAX) packet 17grams in 16 oz of water twice a day until bowel movement.  LAXITIVE.  Restart if two days since last bowel movement Patient taking differently: Take 17 g by mouth daily as needed for mild constipation. 17grams in 16 oz of water twice a day until bowel movement.  LAXITIVE.  Restart if two days since last bowel movement 04/10/15  Yes Shepperson, Kirstin, PA-C  rosuvastatin (CRESTOR) 5 MG tablet Take 1 tablet (5 mg) on Mondays and Wednesdays, and Friday 07/23/17  Yes Rodriguez-Guzman, Raquel, RPH  vitamin B-12 (CYANOCOBALAMIN) 1000 MCG tablet Take 1,000 mcg by mouth daily.   Yes [provider]    Family History Family History  Problem Relation Age of Onset    . CAD Unknown   . Hypertension Unknown   . Heart attack Unknown   . Cancer Unknown   . CAD Mother   . Cancer Mother   . CAD Father   . Diabetes Sister   . CAD Sister   . Leukemia Brother     Social History Social History   Tobacco Use  . Smoking status: Never Smoker  . Smokeless tobacco: Never Used  Substance Use Topics  . Alcohol use: No    Alcohol/week: 0.0 standard drinks  . Drug use:  No     Allergies   Ace inhibitors; Gemfibrozil; Moxifloxacin; and Ranexa [ranolazine]   Review of Systems Review of Systems  All other systems reviewed and are negative.    Physical Exam Updated Vital Signs BP (!) 160/58   Pulse 64   Temp (!) 97.5 F (36.4 C) (Oral)   Resp (!) 21   Ht 5\' 2"  (1.575 m)   Wt 87.5 kg   SpO2 100%   BMI 35.30 kg/m   Physical Exam  Constitutional: She is oriented to person, place, and time. She appears well-developed and well-nourished.  nad  HENT:  Head: Normocephalic and atraumatic.  Eyes: Conjunctivae are normal.  Neck: Neck supple.  Cardiovascular: Normal rate and regular rhythm.  Pulmonary/Chest: Effort normal and breath sounds normal.  Abdominal: Soft. Bowel sounds are normal.  Musculoskeletal: Normal range of motion.  Neurological: She is alert and oriented to person, place, and time.  Skin: Skin is warm and dry.  Psychiatric: She has a normal mood and affect. Her behavior is normal.  Nursing note and vitals reviewed.    ED Treatments / Results  Labs (all labs ordered are listed, but only abnormal results are displayed) Labs Reviewed  BASIC METABOLIC PANEL - Abnormal; Notable for the following components:      Result Value   Glucose, Bld 102 (*)    Creatinine, Ser 1.39 (*)    GFR calc non Af Amer 36 (*)    GFR calc Af Amer 42 (*)    All other components within normal limits  CBC  TROPONIN I  TROPONIN I    EKG EKG Interpretation  Date/Time:  Friday February 04 2018 17:08:48 EST Ventricular Rate:  80 PR  Interval:  124 QRS Duration: 66 QT Interval:  364 QTC Calculation: 419 R Axis:   11 Text Interpretation:  Normal sinus rhythm Nonspecific ST and T wave abnormality Abnormal ECG Confirmed by Nat Christen [phone]) on 02/04/2018 6:30:03 PM   Radiology Dg Chest 2 View  Result Date: 02/04/2018 CLINICAL DATA:  LEFT chest pain radiating to LEFT neck and arm beginning at 1400 hours. History of COPD. EXAM: CHEST - 2 VIEW COMPARISON:  Chest radiograph December 07, 2017 FINDINGS: Cardiomediastinal silhouette is normal. No pleural effusions or focal consolidations. Trachea projects midline and there is no pneumothorax. Soft tissue planes and included osseous structures are non-suspicious. Mildly elevated RIGHT hemidiaphragm. Status post LEFT shoulder arthroplasty. Surgical clips in the included right abdomen compatible with cholecystectomy. IMPRESSION: Stable examination: No acute cardiopulmonary process. Electronically Signed   By: Elon Alas M.D.   On: 02/04/2018 18:48    Procedures Procedures (including critical care time)  Medications Ordered in ED Medications  nitroGLYCERIN (NITROSTAT) SL tablet 0.4 mg (0.4 mg Sublingual Given 02/04/18 2032)     Initial Impression / Assessment and Plan / ED Course  I have reviewed the triage vital signs and the nursing notes.  Pertinent labs & imaging results that were available during my care of the patient were reviewed by me and considered in my medical decision making (see chart for details).     Patient with a known history of CAD presents with chest pain.  EKG normal.  Delta troponin negative.  Will discuss with cardiology.  Patient is hemodynamically stable.  Final Clinical Impressions(s) / ED Diagnoses   Final diagnoses:  Chest pain, unspecified type    ED Discharge Orders    None       Nat Christen, MD 02/04/18 2126

## 2018-02-04 NOTE — ED Notes (Signed)
From Rad 

## 2018-02-04 NOTE — ED Notes (Signed)
Pt reports at home "sitting around"  Chest pain under her L breat around to her armpit  Smiling, family at bedside  Unsuccessful IV stick

## 2018-02-04 NOTE — ED Notes (Signed)
Pt reports that her headache is worse, but her CP is down to a 1/10

## 2018-02-08 ENCOUNTER — Telehealth: Payer: Self-pay | Admitting: Cardiovascular Disease

## 2018-02-08 MED ORDER — NITROGLYCERIN 0.4 MG SL SUBL: 0.4000 mg | SUBLINGUAL_TABLET | SUBLINGUAL | 3 refills | Status: AC | PRN

## 2018-02-08 NOTE — Telephone Encounter (Signed)
Patient called wanting to see if she can get a prescription for Nitroglycerin.  [facility]  [place]

## 2018-02-08 NOTE — Telephone Encounter (Signed)
Yes

## 2018-03-01 ENCOUNTER — Telehealth (INDEPENDENT_AMBULATORY_CARE_PROVIDER_SITE_OTHER): Payer: Self-pay | Admitting: *Deleted

## 2018-03-01 NOTE — Telephone Encounter (Signed)
Per Dr Quintin Alto patient can stop Plavix 5 days & ASA 2 days prior to TCS sch'd 03/31/18, patient aware

## 2018-03-02 ENCOUNTER — Other Ambulatory Visit: Payer: Self-pay | Admitting: Pharmacist

## 2018-03-02 MED ORDER — ROSUVASTATIN CALCIUM 5 MG PO TABS: ORAL_TABLET | ORAL | 3 refills | Status: DC

## 2018-03-31 ENCOUNTER — Encounter (HOSPITAL_COMMUNITY)
Admission: RE | Disposition: A | Discharge status: Home / Self Care | Payer: Self-pay | Source: Home / Self Care | Attending: Internal Medicine

## 2018-03-31 ENCOUNTER — Ambulatory Visit (HOSPITAL_COMMUNITY)
Admission: RE | Admit: 2018-03-31 | Discharge: 2018-03-31 | Disposition: A | Discharge status: Home / Self Care | Payer: Medicare Other | Attending: Internal Medicine | Admitting: Internal Medicine

## 2018-03-31 ENCOUNTER — Other Ambulatory Visit: Payer: Self-pay

## 2018-03-31 ENCOUNTER — Encounter (HOSPITAL_COMMUNITY): Payer: Self-pay | Admitting: *Deleted

## 2018-03-31 DIAGNOSIS — N189 Chronic kidney disease, unspecified: Secondary | ICD-10-CM | POA: Insufficient documentation

## 2018-03-31 DIAGNOSIS — Z833 Family history of diabetes mellitus: Secondary | ICD-10-CM | POA: Diagnosis not present

## 2018-03-31 DIAGNOSIS — R197 Diarrhea, unspecified: Secondary | ICD-10-CM | POA: Insufficient documentation

## 2018-03-31 DIAGNOSIS — Z7902 Long term (current) use of antithrombotics/antiplatelets: Secondary | ICD-10-CM | POA: Insufficient documentation

## 2018-03-31 DIAGNOSIS — D120 Benign neoplasm of cecum: Secondary | ICD-10-CM | POA: Insufficient documentation

## 2018-03-31 DIAGNOSIS — D125 Benign neoplasm of sigmoid colon: Secondary | ICD-10-CM | POA: Insufficient documentation

## 2018-03-31 DIAGNOSIS — D123 Benign neoplasm of transverse colon: Secondary | ICD-10-CM | POA: Diagnosis not present

## 2018-03-31 DIAGNOSIS — K219 Gastro-esophageal reflux disease without esophagitis: Secondary | ICD-10-CM | POA: Insufficient documentation

## 2018-03-31 DIAGNOSIS — G4730 Sleep apnea, unspecified: Secondary | ICD-10-CM | POA: Insufficient documentation

## 2018-03-31 DIAGNOSIS — F419 Anxiety disorder, unspecified: Secondary | ICD-10-CM | POA: Diagnosis not present

## 2018-03-31 DIAGNOSIS — K580 Irritable bowel syndrome with diarrhea: Secondary | ICD-10-CM | POA: Insufficient documentation

## 2018-03-31 DIAGNOSIS — Z86010 Personal history of colonic polyps: Secondary | ICD-10-CM | POA: Insufficient documentation

## 2018-03-31 DIAGNOSIS — Z96612 Presence of left artificial shoulder joint: Secondary | ICD-10-CM | POA: Diagnosis not present

## 2018-03-31 DIAGNOSIS — Z8711 Personal history of peptic ulcer disease: Secondary | ICD-10-CM | POA: Insufficient documentation

## 2018-03-31 DIAGNOSIS — Z955 Presence of coronary angioplasty implant and graft: Secondary | ICD-10-CM | POA: Insufficient documentation

## 2018-03-31 DIAGNOSIS — Z6833 Body mass index (BMI) 33.0-33.9, adult: Secondary | ICD-10-CM | POA: Diagnosis not present

## 2018-03-31 DIAGNOSIS — J449 Chronic obstructive pulmonary disease, unspecified: Secondary | ICD-10-CM | POA: Insufficient documentation

## 2018-03-31 DIAGNOSIS — Z888 Allergy status to other drugs, medicaments and biological substances status: Secondary | ICD-10-CM | POA: Insufficient documentation

## 2018-03-31 DIAGNOSIS — I129 Hypertensive chronic kidney disease with stage 1 through stage 4 chronic kidney disease, or unspecified chronic kidney disease: Secondary | ICD-10-CM | POA: Insufficient documentation

## 2018-03-31 DIAGNOSIS — F329 Major depressive disorder, single episode, unspecified: Secondary | ICD-10-CM | POA: Diagnosis not present

## 2018-03-31 DIAGNOSIS — K5730 Diverticulosis of large intestine without perforation or abscess without bleeding: Secondary | ICD-10-CM | POA: Insufficient documentation

## 2018-03-31 DIAGNOSIS — Z809 Family history of malignant neoplasm, unspecified: Secondary | ICD-10-CM | POA: Insufficient documentation

## 2018-03-31 DIAGNOSIS — E785 Hyperlipidemia, unspecified: Secondary | ICD-10-CM | POA: Insufficient documentation

## 2018-03-31 DIAGNOSIS — Z7951 Long term (current) use of inhaled steroids: Secondary | ICD-10-CM | POA: Insufficient documentation

## 2018-03-31 DIAGNOSIS — Z96653 Presence of artificial knee joint, bilateral: Secondary | ICD-10-CM | POA: Insufficient documentation

## 2018-03-31 DIAGNOSIS — M549 Dorsalgia, unspecified: Secondary | ICD-10-CM | POA: Insufficient documentation

## 2018-03-31 DIAGNOSIS — I25119 Atherosclerotic heart disease of native coronary artery with unspecified angina pectoris: Secondary | ICD-10-CM | POA: Diagnosis not present

## 2018-03-31 DIAGNOSIS — Z8249 Family history of ischemic heart disease and other diseases of the circulatory system: Secondary | ICD-10-CM | POA: Insufficient documentation

## 2018-03-31 DIAGNOSIS — Z806 Family history of leukemia: Secondary | ICD-10-CM | POA: Insufficient documentation

## 2018-03-31 DIAGNOSIS — E669 Obesity, unspecified: Secondary | ICD-10-CM | POA: Diagnosis not present

## 2018-03-31 DIAGNOSIS — Z79899 Other long term (current) drug therapy: Secondary | ICD-10-CM | POA: Insufficient documentation

## 2018-03-31 DIAGNOSIS — E039 Hypothyroidism, unspecified: Secondary | ICD-10-CM | POA: Insufficient documentation

## 2018-03-31 DIAGNOSIS — Z7982 Long term (current) use of aspirin: Secondary | ICD-10-CM | POA: Insufficient documentation

## 2018-03-31 DIAGNOSIS — R1030 Lower abdominal pain, unspecified: Secondary | ICD-10-CM | POA: Diagnosis not present

## 2018-03-31 DIAGNOSIS — Z8673 Personal history of transient ischemic attack (TIA), and cerebral infarction without residual deficits: Secondary | ICD-10-CM | POA: Insufficient documentation

## 2018-03-31 HISTORY — PX: POLYPECTOMY: SHX5525

## 2018-03-31 HISTORY — PX: COLONOSCOPY: SHX5424

## 2018-03-31 SURGERY — COLONOSCOPY
Anesthesia: Moderate Sedation

## 2018-03-31 MED ORDER — MEPERIDINE HCL 50 MG/ML IJ SOLN
INTRAMUSCULAR | Status: DC | PRN
  Administered 2018-03-31 (×2): 25 mg

## 2018-03-31 MED ORDER — MIDAZOLAM HCL 5 MG/5ML IJ SOLN
INTRAMUSCULAR | Status: AC
  Filled 2018-03-31: qty 10

## 2018-03-31 MED ORDER — STERILE WATER FOR IRRIGATION IR SOLN
Status: DC | PRN
  Administered 2018-03-31: 1.5 mL

## 2018-03-31 MED ORDER — DICYCLOMINE HCL 10 MG PO CAPS: 10.0000 mg | ORAL_CAPSULE | Freq: Two times a day (BID) | ORAL | 2 refills | Status: DC

## 2018-03-31 MED ORDER — BENEFIBER DRINK MIX PO PACK: 4.0000 g | PACK | Freq: Every day | ORAL | Status: DC

## 2018-03-31 MED ORDER — SODIUM CHLORIDE 0.9 % IV SOLN
INTRAVENOUS | Status: DC
  Administered 2018-03-31: 1000 mL via INTRAVENOUS

## 2018-03-31 MED ORDER — MEPERIDINE HCL 50 MG/ML IJ SOLN
INTRAMUSCULAR | Status: AC
  Filled 2018-03-31: qty 1

## 2018-03-31 MED ORDER — MIDAZOLAM HCL 5 MG/5ML IJ SOLN
INTRAMUSCULAR | Status: DC | PRN
  Administered 2018-03-31 (×2): 2 mg via INTRAVENOUS

## 2018-03-31 NOTE — Discharge Instructions (Signed)
Resume aspirin on 04/03/2018 and clopidogrel on 04/06/2018. Resume other medications as before. Dicyclomine 10 mg by mouth 30 minutes before breakfast and evening meal daily. Benefiber 4 g by mouth daily at bedtime. High-fiber diet. No driving for 24 hours. Physician will call with biopsy results.  PATIENT INSTRUCTIONS POST-ANESTHESIA  IMMEDIATELY FOLLOWING SURGERY:  Do not drive or operate machinery for the first twenty four hours after surgery.  Do not make any important decisions for twenty four hours after surgery or while taking narcotic pain medications or sedatives.  If you develop intractable nausea and vomiting or a severe headache please notify your doctor immediately.  FOLLOW-UP:  Please make an appointment with your surgeon as instructed. You do not need to follow up with anesthesia unless specifically instructed to do so.  WOUND CARE INSTRUCTIONS (if applicable):  Keep a dry clean dressing on the anesthesia/puncture wound site if there is drainage.  Once the wound has quit draining you may leave it open to air.  Generally you should leave the bandage intact for twenty four hours unless there is drainage.  If the epidural site drains for more than 36-48 hours please call the anesthesia department.  QUESTIONS?:  Please feel free to call your physician or the hospital operator if you have any questions, and they will be happy to assist you.      Colonoscopy, Adult, Care After This sheet gives you information about how to care for yourself after your procedure. Your doctor may also give you more specific instructions. If you have problems or questions, call your doctor. What can I expect after the procedure? After the procedure, it is common to have:  A small amount of blood in your poop for 24 hours.  Some gas.  Mild cramping or bloating in your belly. Follow these instructions at home: General instructions  For the first 24 hours after the procedure: ? Do not drive or use  machinery. ? Do not sign important documents. ? Do not drink alcohol. ? Do your daily activities more slowly than normal. ? Eat foods that are soft and easy to digest.  Take over-the-counter or prescription medicines only as told by your doctor. To help cramping and bloating:   Try walking around.  Put heat on your belly (abdomen) as told by your doctor. Use a heat source that your doctor recommends, such as a moist heat pack or a heating pad. ? Put a towel between your skin and the heat source. ? Leave the heat on for 20-30 minutes. ? Remove the heat if your skin turns bright red. This is especially important if you cannot feel pain, heat, or cold. You can get burned. Eating and drinking   Drink enough fluid to keep your pee (urine) clear or pale yellow.  Return to your normal diet as told by your doctor. Avoid heavy or fried foods that are hard to digest.  Avoid drinking alcohol for as long as told by your doctor. Contact a doctor if:  You have blood in your poop (stool) 2-3 days after the procedure. Get help right away if:  You have more than a small amount of blood in your poop.  You see large clumps of tissue (blood clots) in your poop.  Your belly is swollen.  You feel sick to your stomach (nauseous).  You throw up (vomit).  You have a fever.  You have belly pain that gets worse, and medicine does not help your pain. Summary  After the procedure, it  is common to have a small amount of blood in your poop. You may also have mild cramping and bloating in your belly.  For the first 24 hours after the procedure, do not drive or use machinery, do not sign important documents, and do not drink alcohol.  Get help right away if you have a lot of blood in your poop, feel sick to your stomach, have a fever, or have more belly pain. This information is not intended to replace advice given to you by your health care provider. Make sure you discuss any questions you have with  your health care provider. Document Released: 04/18/2010 Document Revised: 01/14/2017 Document Reviewed: 12/09/2015 Elsevier Interactive Patient Education  2019 [employer].    Colon Polyps  Polyps are tissue growths inside the body. Polyps can grow in many places, including the large intestine (colon). A polyp may be a round bump or a mushroom-shaped growth. You could have one polyp or several. Most colon polyps are noncancerous (benign). However, some colon polyps can become cancerous over time. Finding and removing the polyps early can help prevent this. What are the causes? The exact cause of colon polyps is not known. What increases the risk? You are more likely to develop this condition if you:  Have a family history of colon cancer or colon polyps.  Are older than 5 or older than 45 if you are African American.  Have inflammatory bowel disease, such as ulcerative colitis or Crohn's disease.  Have certain hereditary conditions, such as: ? Familial adenomatous polyposis. ? Lynch syndrome. ? Turcot syndrome. ? Peutz-Jeghers syndrome.  Are overweight.  Smoke cigarettes.  Do not get enough exercise.  Drink too much alcohol.  Eat a diet that is high in fat and red meat and low in fiber.  Had childhood cancer that was treated with abdominal radiation. What are the signs or symptoms? Most polyps do not cause symptoms. If you have symptoms, they may include:  Blood coming from your rectum when having a bowel movement.  Blood in your stool. The stool may look dark red or black.  Abdominal pain.  A change in bowel habits, such as constipation or diarrhea. How is this diagnosed? This condition is diagnosed with a colonoscopy. This is a procedure in which a lighted, flexible scope is inserted into the anus and then passed into the colon to examine the area. Polyps are sometimes found when a colonoscopy is done as part of routine cancer screening tests. How is this  treated? Treatment for this condition involves removing any polyps that are found. Most polyps can be removed during a colonoscopy. Those polyps will then be tested for cancer. Additional treatment may be needed depending on the results of testing. Follow these instructions at home: Lifestyle  Maintain a healthy weight, or lose weight if recommended by your health care provider.  Exercise every day or as told by your health care provider.  Do not use any products that contain nicotine or tobacco, such as cigarettes and e-cigarettes. If you need help quitting, ask your health care provider.  If you drink alcohol, limit how much you have: ? 0-1 drink a day for women. ? 0-2 drinks a day for men.  Be aware of how much alcohol is in your drink. In the [place] one drink equals one 12 oz bottle of beer (355 mL), one 5 oz glass of wine (148 mL), or one 1 oz shot of hard liquor (44 mL). Eating and drinking  Eat foods that are high in fiber, such as fruits, vegetables, and whole grains.  Eat foods that are high in calcium and vitamin D, such as milk, cheese, yogurt, eggs, liver, fish, and broccoli.  Limit foods that are high in fat, such as fried foods and desserts.  Limit the amount of red meat and processed meat you eat, such as hot dogs, sausage, bacon, and lunch meats. General instructions  Keep all follow-up visits as told by your health care provider. This is important. ? This includes having regularly scheduled colonoscopies. ? Talk to your health care provider about when you need a colonoscopy. Contact a health care provider if:  You have new or worsening bleeding during a bowel movement.  You have new or increased blood in your stool.  You have a change in bowel habits.  You lose weight for no known reason. Summary  Polyps are tissue growths inside the body. Polyps can grow in many places, including the colon.  Most colon polyps are noncancerous (benign), but some can  become cancerous over time.  This condition is diagnosed with a colonoscopy.  Treatment for this condition involves removing any polyps that are found. Most polyps can be removed during a colonoscopy. This information is not intended to replace advice given to you by your health care provider. Make sure you discuss any questions you have with your health care provider. Document Released: 12/11/2003 Document Revised: 07/01/2017 Document Reviewed: 07/01/2017 Elsevier Interactive Patient Education  2019 [employer].

## 2018-03-31 NOTE — H&P (Signed)
Annette Sharp is an 78 y.o. female.   Chief Complaint: Patient is here for colonoscopy. HPI: Patient is 78 year old Caucasian female who presents with 2-year history of lower abdominal pain worse with a bowel movement and she also gives history of diarrhea but occasionally she may be constipated.  When she has diarrhea she may have 4-5 bowel movements per day.  She denies rectal bleeding or weight loss.  She says she has urgency and has intermittent accidents. Patient underwent 2 colonoscopies in 2007.  Second exam was done by Dr. Silverio Lay yesterday October 2007.  He has small tubular adenoma removed.  Biopsies from sigmoid colon did not show changes of colitis. She has been off aspirin and Plavix for 5 days. Family history is negative for CRC.  Past Medical History:  Diagnosis Date  . Anxiety   . Back pain   . Cerebrovascular disease    NONOBSTRUCTIVE CVA BY CAROTID DOPPLERS OCTOBER 2008  . Chronic renal insufficiency    GFR 45 ML'S PER MINUTE  . COPD (chronic obstructive pulmonary disease) ([place])   . Coronary artery disease    a. s/p DES to RCA in 2007 b. patent stent by cath in 12/2014 with nonobstructive disease along LAD and RCA  . Coronary atherosclerosis of native coronary artery    NEGATIVE LEXISCAN FOR ISCHEMIA MARCH 23,2010 NORMAL LV FUNCTTION  . Depression   . Dyspnea    with exertion  . GERD (gastroesophageal reflux disease)   . Headache    chronic headache from fall in December  . Hyperlipidemia   . Hypertension   . Hypothyroidism   . Other and unspecified angina pectoris    ANGINA  . Overweight(278.02)    OBESITY  . Peptic ulcer disease   . Primary localized osteoarthritis of left knee   . Sleep apnea    SEVERE OBSTRUCTIVE    cpap  . Stroke [facility])    affected memory  . Thyroid disease    TREATED HYPOTHYROIDISM    Past Surgical History:  Procedure Laterality Date  . ABDOMINAL HYSTERECTOMY    . CARDIAC CATHETERIZATION N/A 01/22/2015   Procedure: Right/Left  Heart Cath and Coronary Angiography;  Surgeon: Wellington Hampshire, MD;  Location: [facility];  Service: Cardiovascular;  Laterality: N/A;  . CARPAL TUNNEL RELEASE    . CHOLECYSTECTOMY    . JOINT REPLACEMENT    . 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 ROTATOR CUFF REPAIR Right 07/08/2009  . TOTAL ABDOMINAL HYSTERECTOMY W/ BILATERAL SALPINGOOPHORECTOMY    . TOTAL KNEE ARTHROPLASTY Left 04/08/2015   Procedure: LEFT TOTAL KNEE ARTHROPLASTY;  Surgeon: Elsie Saas, MD;  Location: [place];  Service: Orthopedics;  Laterality: Left;  . TOTAL SHOULDER ARTHROPLASTY Left 06/02/2017   Procedure: TOTAL SHOULDER ARTHROPLASTY;  Surgeon: Hiram Gash, MD;  Location: [place];  Service: Orthopedics;  Laterality: Left;    Family History  Problem Relation Age of Onset  . CAD Other   . Hypertension Other   . Heart attack Other   . Cancer Other   . CAD Mother   . Cancer Mother   . CAD Father   . Diabetes Sister   . CAD Sister   . Leukemia Brother    Social History:  reports that she has never smoked. She has never used smokeless tobacco. She reports that she does not drink alcohol or use drugs.  Allergies:  Allergies  Allergen Reactions  . Ace Inhibitors Cough  . Gemfibrozil Other (See Comments)  Muscle Aches  . Moxifloxacin Rash  . Ranexa [Ranolazine] Other (See Comments)    "NIGHTMARES"    Medications Prior to Admission  Medication Sig Dispense Refill  . acyclovir (ZOVIRAX) 400 MG tablet Take 400 mg by mouth daily.    Marland Kitchen albuterol (PROVENTIL HFA;VENTOLIN HFA) 108 (90 Base) MCG/ACT inhaler Inhale 2 puffs into the lungs every 6 (six) hours as needed for wheezing or shortness of breath. 1 Inhaler 2  . amLODipine (NORVASC) 10 MG tablet Take 10 mg by mouth every morning.  3  . aspirin EC 81 MG tablet Take 81 mg by mouth at bedtime.     . Cholecalciferol (VITAMIN D3) 50 MCG (2000 UT) TABS Take 2,000 Units by mouth daily.    . citalopram (CELEXA) 20 MG tablet Take 20 mg by mouth every morning.      . clopidogrel (PLAVIX) 75 MG tablet Take 75 mg by mouth every morning.     . isosorbide mononitrate (IMDUR) 60 MG 24 hr tablet Take 60 mg by mouth 2 (two) times daily.    Marland Kitchen levothyroxine (SYNTHROID, LEVOTHROID) 88 MCG tablet Take 88 mcg by mouth every morning.     . Melatonin 5 MG SUBL Take 5 mg by mouth at bedtime.     . polyethylene glycol (MIRALAX / GLYCOLAX) packet 17grams in 16 oz of water twice a day until bowel movement.  LAXITIVE.  Restart if two days since last bowel movement (Patient taking differently: Take 17 g by mouth daily as needed for mild constipation. 17grams in 16 oz of water twice a day until bowel movement.  LAXITIVE.  Restart if two days since last bowel movement) 60 each 0  . rosuvastatin (CRESTOR) 5 MG tablet Take 1 tablet (5 mg) on Mondays and Wednesdays, and Friday (Patient taking differently: Take 5 mg by mouth every Monday, Wednesday, and Friday. ) 30 tablet 3  . vitamin B-12 (CYANOCOBALAMIN) 1000 MCG tablet Take 1,000 mcg by mouth daily.    . betamethasone dipropionate (DIPROLENE) 0.05 % cream Apply 1 application topically 2 (two) times daily as needed (atrophic vaginitis).     . nitroGLYCERIN (NITROSTAT) 0.4 MG SL tablet Place 1 tablet (0.4 mg total) under the tongue every 5 (five) minutes x 3 doses as needed for chest pain (if no relief after 3rd dose, proceed to the ED for an evaluation). 25 tablet 3  . omeprazole (PRILOSEC) 20 MG capsule Take 20 mg by mouth daily as needed (acid reflux/indigestion.).       No results found for this or any previous visit (from the past 48 hour(s)). No results found.  ROS  Blood pressure (!) 142/58, pulse 72, temperature 98.2 F (36.8 C), temperature source Oral, resp. rate 16, height 5\' 2"  (1.575 m), weight 82.1 kg, SpO2 99 %. Physical Exam  Constitutional: She appears well-developed and well-nourished.  HENT:  Mouth/Throat: Oropharynx is clear and moist.  Eyes: Conjunctivae are normal. No scleral icterus.  Neck: No  thyromegaly present.  Cardiovascular: Normal rate, regular rhythm and normal heart sounds.  No murmur heard. Respiratory: Effort normal and breath sounds normal.  GI:  Abdomen is symmetrical.  She has lower midline and Pfannenstiel scars.  She has mild tenderness in both iliac fossae.  No organomegaly or masses.  Lymphadenopathy:    She has no cervical adenopathy.     Assessment/Plan Abdominal pain and diarrhea. History of colonic adenoma(patient does not remember). Diagnostic colonoscopy.  Hildred Laser, MD 03/31/2018, 1:20 PM

## 2018-03-31 NOTE — Op Note (Signed)
[facility] Patient Name: Annette Sharp Procedure Date: 03/31/2018 12:50 PM MRN: [id number] Date of Birth: [birth year] Attending MD: Hildred Laser , MD CSN: [id number] Age: 78 Admit Type: Outpatient Procedure:                Colonoscopy Indications:              Lower abdominal pain, Diarrhea Providers:                Hildred Laser, MD, Gerome Sam, RN, Aram Candela Referring MD:             Manon Hilding, MD Medicines:                Meperidine 50 mg IV, Midazolam 4 mg IV Complications:            No immediate complications. Estimated Blood Loss:     Estimated blood loss was minimal. Procedure:                Pre-Anesthesia Assessment:                           - Prior to the procedure, a History and Physical                            was performed, and patient medications and                            allergies were reviewed. The patient's tolerance of                            previous anesthesia was also reviewed. The risks                            and benefits of the procedure and the sedation                            options and risks were discussed with the patient.                            All questions were answered, and informed consent                            was obtained. Prior Anticoagulants: The patient                            last took aspirin 5 days and Plavix (clopidogrel) 5                            days prior to the procedure. ASA Grade Assessment:                            III - A patient with severe systemic disease. After                            reviewing the risks and benefits, the patient was  deemed in satisfactory condition to undergo the                            procedure.                           After obtaining informed consent, the colonoscope                            was passed under direct vision. Throughout the                            procedure, the patient's blood pressure, pulse, and                      oxygen saturations were monitored continuously. The                            [id number]-[id number] ([id number]) scope was introduced through                            the anus and advanced to the the cecum, identified                            by appendiceal orifice and ileocecal valve. The                            colonoscopy was performed without difficulty. The                            patient tolerated the procedure well. The quality                            of the bowel preparation was good. The ileocecal                            valve, appendiceal orifice, and rectum were                            photographed. Scope In: 1:33:33 PM Scope Out: 2:03:01 PM Scope Withdrawal Time: 0 hours 21 minutes 10 seconds  Total Procedure Duration: 0 hours 29 minutes 28 seconds  Findings:      The perianal and digital rectal examinations were normal.      A 6 mm polyp was found in the cecum. The polyp was sessile. The polyp       was removed with a cold snare. Resection and retrieval were complete. To       stop active bleeding, one hemostatic clip was successfully placed. There       was no bleeding at the end of the procedure. The pathology specimen was       placed into Bottle Number 1.      A small polyp was found in the mid transverse colon. The polyp was       sessile. The polyp was removed with a cold snare. Resection was       complete, but the polyp tissue  was not retrieved.      Two sessile polyps were found in the splenic flexure. The polyps were       small in size. These polyps were removed with a cold snare. Resection       and retrieval were complete. The pathology specimen was placed into       Bottle Number 1.      A 7 mm polyp was found in the distal sigmoid colon. The polyp was       semi-pedunculated. The polyp was removed with a hot snare. Resection and       retrieval were complete. The pathology specimen was placed into Bottle       Number 2.       Scattered medium-mouthed diverticula were found in the sigmoid colon.      The retroflexed view of the distal rectum and anal verge was normal and       showed no anal or rectal abnormalities. Impression:               - One 6 mm polyp in the cecum, removed with a cold                            snare. Resected and retrieved. Clip was placed.                           - One small polyp in the mid transverse colon,                            removed with a cold snare. Complete resection.                            Polyp tissue not retrieved.                           - Two small polyps at the splenic flexure, removed                            with a cold snare. Resected and retrieved.                           - One 7 mm polyp in the distal sigmoid colon,                            removed with a hot snare. Resected and retrieved.                           - Diverticulosis in the sigmoid colon. Moderate Sedation:      Moderate (conscious) sedation was administered by the endoscopy nurse       and supervised by the endoscopist. The following parameters were       monitored: oxygen saturation, heart rate, blood pressure, CO2       capnography and response to care. Total physician intraservice time was       46 minutes. Recommendation:           - Patient has a contact number available for  emergencies. The signs and symptoms of potential                            delayed complications were discussed with the                            patient. Return to normal activities tomorrow.                            Written discharge instructions were provided to the                            patient.                           - High fiber diet today.                           - Continue present medications.                           - Resume aspirin in 3 days and Plavix (clopidogrel)                            in 5 days at prior doses.                           -  Dicyclomine 10 mg by mouth daily before breakfast                            and lunch.                           - Benefiber 4 g po qhs.                           - Await pathology results.                           - Repeat colonoscopy is recommended. The                            colonoscopy date will be determined after pathology                            results from today's exam become available for                            review. Procedure Code(s):        --- Professional ---                           [phone], Colonoscopy, flexible; with removal of                            tumor(s), polyp(s), or other lesion(s) by snare  technique                           [id number], Moderate sedation; each additional 15                            minutes intraservice time                           [id number], Moderate sedation; each additional 15                            minutes intraservice time                           [id number], Moderate sedation services provided by the                            same physician or other qualified health care                            professional performing a gastrointestinal                            endoscopic service that sedation supports,                            requiring the presence of an independent trained                            observer to assist in the monitoring of the                            patient's level of consciousness and physiological                            status; initial 15 minutes of intra-service time;                            patient age 41 years or older (additional time may                            be reported with [phone], as appropriate) Diagnosis Code(s):        --- Professional ---                           D12.0, Benign neoplasm of cecum                           D12.3, Benign neoplasm of transverse colon (hepatic                            flexure or splenic flexure)                            D12.5, Benign neoplasm of sigmoid colon  R10.30, Lower abdominal pain, unspecified                           R19.7, Diarrhea, unspecified                           K57.30, Diverticulosis of large intestine without                            perforation or abscess without bleeding CPT copyright 2018 [employer]. All rights reserved. The codes documented in this report are preliminary and upon coder review may  be revised to meet current compliance requirements. Hildred Laser, MD Hildred Laser, MD 03/31/2018 2:16:40 PM This report has been signed electronically. Number of Addenda: 0

## 2018-04-06 ENCOUNTER — Encounter (HOSPITAL_COMMUNITY): Payer: Self-pay | Admitting: Internal Medicine

## 2018-04-14 DIAGNOSIS — R5383 Other fatigue: Secondary | ICD-10-CM | POA: Diagnosis not present

## 2018-04-14 DIAGNOSIS — R001 Bradycardia, unspecified: Secondary | ICD-10-CM | POA: Diagnosis not present

## 2018-04-14 DIAGNOSIS — N183 Chronic kidney disease, stage 3 (moderate): Secondary | ICD-10-CM | POA: Diagnosis not present

## 2018-04-14 DIAGNOSIS — I10 Essential (primary) hypertension: Secondary | ICD-10-CM | POA: Diagnosis not present

## 2018-04-14 DIAGNOSIS — D519 Vitamin B12 deficiency anemia, unspecified: Secondary | ICD-10-CM | POA: Diagnosis not present

## 2018-04-14 DIAGNOSIS — E7800 Pure hypercholesterolemia, unspecified: Secondary | ICD-10-CM | POA: Diagnosis not present

## 2018-04-14 DIAGNOSIS — E876 Hypokalemia: Secondary | ICD-10-CM | POA: Diagnosis not present

## 2018-04-14 DIAGNOSIS — E039 Hypothyroidism, unspecified: Secondary | ICD-10-CM | POA: Diagnosis not present

## 2018-04-19 DIAGNOSIS — Z23 Encounter for immunization: Secondary | ICD-10-CM | POA: Diagnosis not present

## 2018-04-19 DIAGNOSIS — I10 Essential (primary) hypertension: Secondary | ICD-10-CM | POA: Diagnosis not present

## 2018-04-19 DIAGNOSIS — Z6835 Body mass index (BMI) 35.0-35.9, adult: Secondary | ICD-10-CM | POA: Diagnosis not present

## 2018-04-19 DIAGNOSIS — Z0001 Encounter for general adult medical examination with abnormal findings: Secondary | ICD-10-CM | POA: Diagnosis not present

## 2018-04-21 ENCOUNTER — Telehealth: Payer: Self-pay | Admitting: Cardiovascular Disease

## 2018-04-21 ENCOUNTER — Other Ambulatory Visit: Payer: Self-pay | Admitting: Cardiovascular Disease

## 2018-04-21 ENCOUNTER — Encounter: Payer: Self-pay | Admitting: Cardiovascular Disease

## 2018-04-21 ENCOUNTER — Ambulatory Visit (INDEPENDENT_AMBULATORY_CARE_PROVIDER_SITE_OTHER): Payer: Medicare Other | Admitting: Cardiovascular Disease

## 2018-04-21 VITALS — BP 144/76 | HR 58 | Ht 62.0 in | Wt 192.2 lb

## 2018-04-21 DIAGNOSIS — I209 Angina pectoris, unspecified: Secondary | ICD-10-CM

## 2018-04-21 DIAGNOSIS — Z955 Presence of coronary angioplasty implant and graft: Secondary | ICD-10-CM | POA: Diagnosis not present

## 2018-04-21 DIAGNOSIS — E785 Hyperlipidemia, unspecified: Secondary | ICD-10-CM

## 2018-04-21 DIAGNOSIS — I10 Essential (primary) hypertension: Secondary | ICD-10-CM

## 2018-04-21 DIAGNOSIS — Z01812 Encounter for preprocedural laboratory examination: Secondary | ICD-10-CM | POA: Diagnosis not present

## 2018-04-21 DIAGNOSIS — I25118 Atherosclerotic heart disease of native coronary artery with other forms of angina pectoris: Secondary | ICD-10-CM | POA: Diagnosis not present

## 2018-04-21 DIAGNOSIS — Z8673 Personal history of transient ischemic attack (TIA), and cerebral infarction without residual deficits: Secondary | ICD-10-CM | POA: Diagnosis not present

## 2018-04-21 NOTE — Progress Notes (Signed)
SUBJECTIVE: The patient presents for routine follow-up.  She was evaluated in the ED for chest pain in early November 2019.  Troponins were normal.  Creatinine was 1.39.  CBC was also normal.  Chest x-ray was stable.  I personally reviewed the ECG which was unremarkable and without acute ischemic abnormalities.  She has a history of coronary artery disease.  Most recent coronary angiogram was performed on 01/22/15. This demonstrated a widely patent RCA stent with mild nonobstructive coronary artery disease with lesions of 20% and 30% in the LAD and RCA. Left ventricular systolic function was normal as was LVEDP. There were normal filling pressures and no evidence of pulmonary hypertension with right heart catheterization.  Metoprolol was stopped due to bradycardia at an office visit on 01/13/2018.  For the past few months, she describes bilateral upper chest tightness and pain with any amount of exertion.  It radiates into both shoulders and into her back.  She has associated lightheadedness.  She can no longer cook a meal or wash the dishes without experiencing chest pain and has to stop.  Her appetite is gone down as well.  Her husband had an MI in 2019 and ever since then, her "health has gone downhill "as per the patient.     Review of Systems: As per "subjective", otherwise negative.  Allergies  Allergen Reactions  . Ace Inhibitors Cough  . Gemfibrozil Other (See Comments)    Muscle Aches  . Moxifloxacin Rash  . Ranexa [Ranolazine] Other (See Comments)    "NIGHTMARES"    Current Outpatient Medications  Medication Sig Dispense Refill  . acyclovir (ZOVIRAX) 400 MG tablet Take 400 mg by mouth daily.    Marland Kitchen albuterol (PROVENTIL HFA;VENTOLIN HFA) 108 (90 Base) MCG/ACT inhaler Inhale 2 puffs into the lungs every 6 (six) hours as needed for wheezing or shortness of breath. 1 Inhaler 2  . amLODipine (NORVASC) 10 MG tablet Take 10 mg by mouth every morning.  3  . aspirin EC 81 MG  tablet Take 1 tablet (81 mg total) by mouth at bedtime.    . betamethasone dipropionate (DIPROLENE) 0.05 % cream Apply 1 application topically 2 (two) times daily as needed (atrophic vaginitis).     . Cholecalciferol (VITAMIN D3) 50 MCG (2000 UT) TABS Take 2,000 Units by mouth daily.    . citalopram (CELEXA) 20 MG tablet Take 20 mg by mouth every morning.     . clopidogrel (PLAVIX) 75 MG tablet Take 1 tablet (75 mg total) by mouth every morning.    . dicyclomine (BENTYL) 10 MG capsule Take 1 capsule (10 mg total) by mouth 2 (two) times daily before a meal. 60 capsule 2  . isosorbide mononitrate (IMDUR) 60 MG 24 hr tablet Take 90 mg by mouth 2 (two) times daily.     Marland Kitchen levothyroxine (SYNTHROID, LEVOTHROID) 88 MCG tablet Take 88 mcg by mouth every morning.     . Melatonin 5 MG SUBL Take 5 mg by mouth at bedtime.     . nitroGLYCERIN (NITROSTAT) 0.4 MG SL tablet Place 1 tablet (0.4 mg total) under the tongue every 5 (five) minutes x 3 doses as needed for chest pain (if no relief after 3rd dose, proceed to the ED for an evaluation). 25 tablet 3  . omeprazole (PRILOSEC) 20 MG capsule Take 20 mg by mouth daily as needed (acid reflux/indigestion.).     Marland Kitchen polyethylene glycol (MIRALAX / GLYCOLAX) packet 17grams in 16 oz of water twice a  day until bowel movement.  LAXITIVE.  Restart if two days since last bowel movement (Patient taking differently: Take 17 g by mouth daily as needed for mild constipation. 17grams in 16 oz of water twice a day until bowel movement.  LAXITIVE.  Restart if two days since last bowel movement) 60 each 0  . rosuvastatin (CRESTOR) 5 MG tablet Take 1 tablet (5 mg) on Mondays and Wednesdays, and Friday (Patient taking differently: Take 5 mg by mouth daily. ) 30 tablet 3  . vitamin B-12 (CYANOCOBALAMIN) 1000 MCG tablet Take 1,000 mcg by mouth daily.    . Wheat Dextrin (BENEFIBER DRINK MIX) PACK Take 4 g by mouth at bedtime.     No current facility-administered medications for this visit.      Past Medical History:  Diagnosis Date  . Anxiety   . Back pain   . Cerebrovascular disease    NONOBSTRUCTIVE CVA BY CAROTID DOPPLERS OCTOBER 2008  . Chronic renal insufficiency    GFR 45 ML'S PER MINUTE  . COPD (chronic obstructive pulmonary disease) ([place])   . Coronary artery disease    a. s/p DES to RCA in 2007 b. patent stent by cath in 12/2014 with nonobstructive disease along LAD and RCA  . Coronary atherosclerosis of native coronary artery    NEGATIVE LEXISCAN FOR ISCHEMIA MARCH 23,2010 NORMAL LV FUNCTTION  . Depression   . Dyspnea    with exertion  . GERD (gastroesophageal reflux disease)   . Headache    chronic headache from fall in December  . Hyperlipidemia   . Hypertension   . Hypothyroidism   . Other and unspecified angina pectoris    ANGINA  . Overweight(278.02)    OBESITY  . Peptic ulcer disease   . Primary localized osteoarthritis of left knee   . Sleep apnea    SEVERE OBSTRUCTIVE    cpap  . Stroke [facility])    affected memory  . Thyroid disease    TREATED HYPOTHYROIDISM    Past Surgical History:  Procedure Laterality Date  . ABDOMINAL HYSTERECTOMY    . CARDIAC CATHETERIZATION N/A 01/22/2015   Procedure: Right/Left Heart Cath and Coronary Angiography;  Surgeon: Wellington Hampshire, MD;  Location: [place] [facility];  Service: Cardiovascular;  Laterality: N/A;  . CARPAL TUNNEL RELEASE    . CHOLECYSTECTOMY    . COLONOSCOPY N/A 03/31/2018   Procedure: COLONOSCOPY;  Surgeon: Rogene Houston, MD;  [facility];  Service: Endoscopy;  Laterality: N/A;  2:45  . JOINT REPLACEMENT    . POLYPECTOMY  03/31/2018   Procedure: POLYPECTOMY;  Surgeon: Rogene Houston, MD;  [facility];  Service: Endoscopy;;  splen. flex, cecum  . 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 ROTATOR CUFF REPAIR Right 07/08/2009  . TOTAL ABDOMINAL HYSTERECTOMY W/ BILATERAL SALPINGOOPHORECTOMY    . TOTAL KNEE ARTHROPLASTY Left 04/08/2015   Procedure: LEFT TOTAL KNEE  ARTHROPLASTY;  Surgeon: Elsie Saas, MD;  Location: [place];  Service: Orthopedics;  Laterality: Left;  . TOTAL SHOULDER ARTHROPLASTY Left 06/02/2017   Procedure: TOTAL SHOULDER ARTHROPLASTY;  Surgeon: Hiram Gash, MD;  Location: [place];  Service: Orthopedics;  Laterality: Left;    Social History   Socioeconomic History  . Marital status: Married    Spouse name: Not on file  . Number of children: Not on file  . Years of education: Not on file  . Highest education level: Not on file  Occupational History  . Not on file  Social Needs  . Financial resource strain: Not on file  . Food insecurity:    Worry: Not on file    Inability: Not on file  . Transportation needs:    Medical: Not on file    Non-medical: Not on file  Tobacco Use  . Smoking status: Never Smoker  . Smokeless tobacco: Never Used  Substance and Sexual Activity  . Alcohol use: No    Alcohol/week: 0.0 standard drinks  . Drug use: No  . Sexual activity: Never  Lifestyle  . Physical activity:    Days per week: Not on file    Minutes per session: Not on file  . Stress: Not on file  Relationships  . Social connections:    Talks on phone: Not on file    Gets together: Not on file    Attends religious service: Not on file    Active member of club or organization: Not on file    Attends meetings of clubs or organizations: Not on file    Relationship status: Not on file  . Intimate partner violence:    Fear of current or ex partner: Not on file    Emotionally abused: Not on file    Physically abused: Not on file    Forced sexual activity: Not on file  Other Topics Concern  . Not on file  Social History Narrative  . Not on file     Vitals:   04/21/18 0950  BP: (!) 144/76  Pulse: (!) 58  SpO2: 99%  Weight: 192 lb 3.2 oz (87.2 kg)  Height: 5\' 2"  (1.575 m)    Wt Readings from Last 3 Encounters:  04/21/18 192 lb 3.2 oz (87.2 kg)  03/31/18 181 lb (82.1 kg)  02/04/18 193 lb (87.5 kg)     PHYSICAL  EXAM General: NAD HEENT: Normal. Neck: No JVD, no thyromegaly. Lungs: Clear to auscultation bilaterally with normal respiratory effort. CV: Regular rate and rhythm, normal S1/S2, no S3/S4, no murmur. No pretibial or periankle edema.  No carotid bruit.   Abdomen: Soft, nontender, no distention.  Neurologic: Alert and oriented.  Psych: Normal affect. Skin: Normal. Musculoskeletal: No gross deformities.    ECG: Reviewed above under Subjective   Labs: Lab Results  Component Value Date/Time   K 3.8 02/04/2018 05:47 PM   BUN 15 02/04/2018 05:47 PM   CREATININE 1.39 (H) 02/04/2018 05:47 PM   ALT 18 05/25/2015 04:40 PM   TSH 1.122 05/25/2015 04:40 PM   HGB 14.5 02/04/2018 05:47 PM     Lipids: Lab Results  Component Value Date/Time   LDLCALC 114 (H) 07/20/2017 11:50 AM   CHOL 188 07/20/2017 11:50 AM   TRIG 80 07/20/2017 11:50 AM   HDL 58 07/20/2017 11:50 AM       ASSESSMENT AND PLAN:  1.  Coronary artery disease with history of RCA stent: She has had progressive chest pain with minimal exertion consistent with angina pectoris, CCS class III.  Currently on aspirin, Imdur, and rosuvastatin.  She takes Plavix for history of CVA. I will arrange for left heart catheterization and coronary angiography. Risks and benefits of cardiac catheterization have been discussed with the patient.  These include bleeding, infection, kidney damage, stroke, heart attack, death.  The patient understands these risks and is willing to proceed.  2.  Hypertension: Blood pressure is mildly elevated.  No changes to therapy.  3.  Hyperlipidemia: LDL 114 on 07/20/2017.    She has a history of statin intolerance but  has been taking Crestor 5 mg every Monday, Wednesday, and Friday.  I will repeat lipids.  4.  History of CVA: She is on Plavix.   Disposition: Follow up after cath  A high level of decision making was required for increased medical complexities.    Kate Sable, M.D., F.A.C.C.

## 2018-04-21 NOTE — H&P (View-Only) (Signed)
SUBJECTIVE: The patient presents for routine follow-up.  She was evaluated in the ED for chest pain in early November 2019.  Troponins were normal.  Creatinine was 1.39.  CBC was also normal.  Chest x-ray was stable.  I personally reviewed the ECG which was unremarkable and without acute ischemic abnormalities.  She has a history of coronary artery disease.  Most recent coronary angiogram was performed on 01/22/15. This demonstrated a widely patent RCA stent with mild nonobstructive coronary artery disease with lesions of 20% and 30% in the LAD and RCA. Left ventricular systolic function was normal as was LVEDP. There were normal filling pressures and no evidence of pulmonary hypertension with right heart catheterization.  Metoprolol was stopped due to bradycardia at an office visit on 01/13/2018.  For the past few months, she describes bilateral upper chest tightness and pain with any amount of exertion.  It radiates into both shoulders and into her back.  She has associated lightheadedness.  She can no longer cook a meal or wash the dishes without experiencing chest pain and has to stop.  Her appetite is gone down as well.  Her husband had an MI in 2019 and ever since then, her "health has gone downhill "as per the patient.     Review of Systems: As per "subjective", otherwise negative.  Allergies  Allergen Reactions  . Ace Inhibitors Cough  . Gemfibrozil Other (See Comments)    Muscle Aches  . Moxifloxacin Rash  . Ranexa [Ranolazine] Other (See Comments)    "NIGHTMARES"    Current Outpatient Medications  Medication Sig Dispense Refill  . acyclovir (ZOVIRAX) 400 MG tablet Take 400 mg by mouth daily.    Marland Kitchen albuterol (PROVENTIL HFA;VENTOLIN HFA) 108 (90 Base) MCG/ACT inhaler Inhale 2 puffs into the lungs every 6 (six) hours as needed for wheezing or shortness of breath. 1 Inhaler 2  . amLODipine (NORVASC) 10 MG tablet Take 10 mg by mouth every morning.  3  . aspirin EC 81 MG  tablet Take 1 tablet (81 mg total) by mouth at bedtime.    . betamethasone dipropionate (DIPROLENE) 0.05 % cream Apply 1 application topically 2 (two) times daily as needed (atrophic vaginitis).     . Cholecalciferol (VITAMIN D3) 50 MCG (2000 UT) TABS Take 2,000 Units by mouth daily.    . citalopram (CELEXA) 20 MG tablet Take 20 mg by mouth every morning.     . clopidogrel (PLAVIX) 75 MG tablet Take 1 tablet (75 mg total) by mouth every morning.    . dicyclomine (BENTYL) 10 MG capsule Take 1 capsule (10 mg total) by mouth 2 (two) times daily before a meal. 60 capsule 2  . isosorbide mononitrate (IMDUR) 60 MG 24 hr tablet Take 90 mg by mouth 2 (two) times daily.     Marland Kitchen levothyroxine (SYNTHROID, LEVOTHROID) 88 MCG tablet Take 88 mcg by mouth every morning.     . Melatonin 5 MG SUBL Take 5 mg by mouth at bedtime.     . nitroGLYCERIN (NITROSTAT) 0.4 MG SL tablet Place 1 tablet (0.4 mg total) under the tongue every 5 (five) minutes x 3 doses as needed for chest pain (if no relief after 3rd dose, proceed to the ED for an evaluation). 25 tablet 3  . omeprazole (PRILOSEC) 20 MG capsule Take 20 mg by mouth daily as needed (acid reflux/indigestion.).     Marland Kitchen polyethylene glycol (MIRALAX / GLYCOLAX) packet 17grams in 16 oz of water twice a  day until bowel movement.  LAXITIVE.  Restart if two days since last bowel movement (Patient taking differently: Take 17 g by mouth daily as needed for mild constipation. 17grams in 16 oz of water twice a day until bowel movement.  LAXITIVE.  Restart if two days since last bowel movement) 60 each 0  . rosuvastatin (CRESTOR) 5 MG tablet Take 1 tablet (5 mg) on Mondays and Wednesdays, and Friday (Patient taking differently: Take 5 mg by mouth daily. ) 30 tablet 3  . vitamin B-12 (CYANOCOBALAMIN) 1000 MCG tablet Take 1,000 mcg by mouth daily.    . Wheat Dextrin (BENEFIBER DRINK MIX) PACK Take 4 g by mouth at bedtime.     No current facility-administered medications for this visit.      Past Medical History:  Diagnosis Date  . Anxiety   . Back pain   . Cerebrovascular disease    NONOBSTRUCTIVE CVA BY CAROTID DOPPLERS OCTOBER 2008  . Chronic renal insufficiency    GFR 45 ML'S PER MINUTE  . COPD (chronic obstructive pulmonary disease) ([place])   . Coronary artery disease    a. s/p DES to RCA in 2007 b. patent stent by cath in 12/2014 with nonobstructive disease along LAD and RCA  . Coronary atherosclerosis of native coronary artery    NEGATIVE LEXISCAN FOR ISCHEMIA MARCH 23,2010 NORMAL LV FUNCTTION  . Depression   . Dyspnea    with exertion  . GERD (gastroesophageal reflux disease)   . Headache    chronic headache from fall in December  . Hyperlipidemia   . Hypertension   . Hypothyroidism   . Other and unspecified angina pectoris    ANGINA  . Overweight(278.02)    OBESITY  . Peptic ulcer disease   . Primary localized osteoarthritis of left knee   . Sleep apnea    SEVERE OBSTRUCTIVE    cpap  . Stroke [facility])    affected memory  . Thyroid disease    TREATED HYPOTHYROIDISM    Past Surgical History:  Procedure Laterality Date  . ABDOMINAL HYSTERECTOMY    . CARDIAC CATHETERIZATION N/A 01/22/2015   Procedure: Right/Left Heart Cath and Coronary Angiography;  Surgeon: Wellington Hampshire, MD;  Location: [place] [facility];  Service: Cardiovascular;  Laterality: N/A;  . CARPAL TUNNEL RELEASE    . CHOLECYSTECTOMY    . COLONOSCOPY N/A 03/31/2018   Procedure: COLONOSCOPY;  Surgeon: Rogene Houston, MD;  [facility];  Service: Endoscopy;  Laterality: N/A;  2:45  . JOINT REPLACEMENT    . POLYPECTOMY  03/31/2018   Procedure: POLYPECTOMY;  Surgeon: Rogene Houston, MD;  [facility];  Service: Endoscopy;;  splen. flex, cecum  . 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 ROTATOR CUFF REPAIR Right 07/08/2009  . TOTAL ABDOMINAL HYSTERECTOMY W/ BILATERAL SALPINGOOPHORECTOMY    . TOTAL KNEE ARTHROPLASTY Left 04/08/2015   Procedure: LEFT TOTAL KNEE  ARTHROPLASTY;  Surgeon: Elsie Saas, MD;  Location: [place];  Service: Orthopedics;  Laterality: Left;  . TOTAL SHOULDER ARTHROPLASTY Left 06/02/2017   Procedure: TOTAL SHOULDER ARTHROPLASTY;  Surgeon: Hiram Gash, MD;  Location: [place];  Service: Orthopedics;  Laterality: Left;    Social History   Socioeconomic History  . Marital status: Married    Spouse name: Not on file  . Number of children: Not on file  . Years of education: Not on file  . Highest education level: Not on file  Occupational History  . Not on file  Social Needs  . Financial resource strain: Not on file  . Food insecurity:    Worry: Not on file    Inability: Not on file  . Transportation needs:    Medical: Not on file    Non-medical: Not on file  Tobacco Use  . Smoking status: Never Smoker  . Smokeless tobacco: Never Used  Substance and Sexual Activity  . Alcohol use: No    Alcohol/week: 0.0 standard drinks  . Drug use: No  . Sexual activity: Never  Lifestyle  . Physical activity:    Days per week: Not on file    Minutes per session: Not on file  . Stress: Not on file  Relationships  . Social connections:    Talks on phone: Not on file    Gets together: Not on file    Attends religious service: Not on file    Active member of club or organization: Not on file    Attends meetings of clubs or organizations: Not on file    Relationship status: Not on file  . Intimate partner violence:    Fear of current or ex partner: Not on file    Emotionally abused: Not on file    Physically abused: Not on file    Forced sexual activity: Not on file  Other Topics Concern  . Not on file  Social History Narrative  . Not on file     Vitals:   04/21/18 0950  BP: (!) 144/76  Pulse: (!) 58  SpO2: 99%  Weight: 192 lb 3.2 oz (87.2 kg)  Height: 5\' 2"  (1.575 m)    Wt Readings from Last 3 Encounters:  04/21/18 192 lb 3.2 oz (87.2 kg)  03/31/18 181 lb (82.1 kg)  02/04/18 193 lb (87.5 kg)     PHYSICAL  EXAM General: NAD HEENT: Normal. Neck: No JVD, no thyromegaly. Lungs: Clear to auscultation bilaterally with normal respiratory effort. CV: Regular rate and rhythm, normal S1/S2, no S3/S4, no murmur. No pretibial or periankle edema.  No carotid bruit.   Abdomen: Soft, nontender, no distention.  Neurologic: Alert and oriented.  Psych: Normal affect. Skin: Normal. Musculoskeletal: No gross deformities.    ECG: Reviewed above under Subjective   Labs: Lab Results  Component Value Date/Time   K 3.8 02/04/2018 05:47 PM   BUN 15 02/04/2018 05:47 PM   CREATININE 1.39 (H) 02/04/2018 05:47 PM   ALT 18 05/25/2015 04:40 PM   TSH 1.122 05/25/2015 04:40 PM   HGB 14.5 02/04/2018 05:47 PM     Lipids: Lab Results  Component Value Date/Time   LDLCALC 114 (H) 07/20/2017 11:50 AM   CHOL 188 07/20/2017 11:50 AM   TRIG 80 07/20/2017 11:50 AM   HDL 58 07/20/2017 11:50 AM       ASSESSMENT AND PLAN:  1.  Coronary artery disease with history of RCA stent: She has had progressive chest pain with minimal exertion consistent with angina pectoris, CCS class III.  Currently on aspirin, Imdur, and rosuvastatin.  She takes Plavix for history of CVA. I will arrange for left heart catheterization and coronary angiography. Risks and benefits of cardiac catheterization have been discussed with the patient.  These include bleeding, infection, kidney damage, stroke, heart attack, death.  The patient understands these risks and is willing to proceed.  2.  Hypertension: Blood pressure is mildly elevated.  No changes to therapy.  3.  Hyperlipidemia: LDL 114 on 07/20/2017.    She has a history of statin intolerance but  has been taking Crestor 5 mg every Monday, Wednesday, and Friday.  I will repeat lipids.  4.  History of CVA: She is on Plavix.   Disposition: Follow up after cath  A high level of decision making was required for increased medical complexities.    Kate Sable, M.D., F.A.C.C.

## 2018-04-21 NOTE — Patient Instructions (Addendum)
Medication Instructions:  Continue all current medications.  Labwork:  BMET, CBC, FLP - orders given today.   Reminder:  Nothing to eat or drink after 12 midnight prior to labs.  Testing/Procedures: Your physician has requested that you have a cardiac catheterization. Cardiac catheterization is used to diagnose and/or treat various heart conditions. Doctors may recommend this procedure for a number of different reasons. The most common reason is to evaluate chest pain. Chest pain can be a symptom of coronary artery disease (CAD), and cardiac catheterization can show whether plaque is narrowing or blocking your heart's arteries. This procedure is also used to evaluate the valves, as well as measure the blood flow and oxygen levels in different parts of your heart. For further information please visit [email or web address]. Please follow instruction sheet, as given.  Follow-Up: 1 month   Any Other Special Instructions Will Be Listed Below (If Applicable).  If you need a refill on your cardiac medications before your next appointment, please call your pharmacy. ===================================================      [address] [place] [id number] Dept: [phone] Loc: [place]  04/21/2018  You are scheduled for a Cardiac Catheterization on Thursday, January 30 with Dr. Sherren Mocha at 12:00 noon.    1. Please arrive at the [facility] (Main Entrance A) at [facility]: [address] [place], [place] [address] at 10:00 AM (This time is two hours before your procedure to ensure your preparation). Free valet parking service is available.   Special note: Every effort is made to have your procedure done on time. Please understand that emergencies sometimes delay scheduled procedures.  2. Diet: Do not eat solid foods after midnight.  The patient  may have clear liquids until 5am upon the day of the procedure.  3. Labs: You will need to have blood drawn on Monday, January 27 at [facility], [place]  Open: 7am - 6pm, Sat 8am - 12 noon   Phone: [phone]. You do not need to be fasting.  4. Medication instructions in preparation for your procedure:   Contrast Allergy: No  YOU MAY TAKE ALL MEDICATIONS MORNING OF PROCEDURE.    On the morning of your procedure, take your Aspirin and any morning medicines NOT listed above.  You may use sips of water.  5. Plan for one night stay--bring personal belongings. 6. Bring a current list of your medications and current insurance cards. 7. You MUST have a responsible person to drive you home. 8. Someone MUST be with you the first 24 hours after you arrive home or your discharge will be delayed. 9. Please wear clothes that are easy to get on and off and wear slip-on shoes.  Thank you for allowing [place] to care for you!   -- [facility]

## 2018-04-21 NOTE — Telephone Encounter (Signed)
Pre-cert Verification for the following procedure    Left heart cath - Thursday, 1/30 - 12:00 - cooper

## 2018-04-25 DIAGNOSIS — Z01812 Encounter for preprocedural laboratory examination: Secondary | ICD-10-CM | POA: Diagnosis not present

## 2018-04-25 DIAGNOSIS — E785 Hyperlipidemia, unspecified: Secondary | ICD-10-CM | POA: Diagnosis not present

## 2018-04-25 DIAGNOSIS — I209 Angina pectoris, unspecified: Secondary | ICD-10-CM | POA: Diagnosis not present

## 2018-04-26 LAB — BASIC METABOLIC PANEL
BUN/Creatinine Ratio: 12 (calc) (ref 6–22)
BUN: 15 mg/dL (ref 7–25)
CHLORIDE: 104 mmol/L (ref 98–110)
CO2: 27 mmol/L (ref 20–32)
Calcium: 9.5 mg/dL (ref 8.6–10.4)
Creat: 1.25 mg/dL — ABNORMAL HIGH (ref 0.60–0.93)
Glucose, Bld: 105 mg/dL — ABNORMAL HIGH (ref 65–99)
Potassium: 4.4 mmol/L (ref 3.5–5.3)
Sodium: 141 mmol/L (ref 135–146)

## 2018-04-26 LAB — CBC
HCT: 40.4 % (ref 35.0–45.0)
Hemoglobin: 14.2 g/dL (ref 11.7–15.5)
MCH: 32.1 pg (ref 27.0–33.0)
MCHC: 35.1 g/dL (ref 32.0–36.0)
MCV: 91.2 fL (ref 80.0–100.0)
MPV: 11.2 fL (ref 7.5–12.5)
Platelets: 267 10*3/uL (ref 140–400)
RBC: 4.43 10*6/uL (ref 3.80–5.10)
RDW: 12.7 % (ref 11.0–15.0)
WBC: 5.6 10*3/uL (ref 3.8–10.8)

## 2018-04-26 LAB — LIPID PANEL
Cholesterol: 168 mg/dL (ref <200)
HDL: 60 mg/dL (ref >50)
LDL Cholesterol (Calc): 93 mg/dL (calc)
Non-HDL Cholesterol (Calc): 108 mg/dL (calc) (ref <130)
Total CHOL/HDL Ratio: 2.8 (calc) (ref <5.0)
Triglycerides: 65 mg/dL (ref <150)

## 2018-04-27 ENCOUNTER — Telehealth: Payer: Self-pay | Admitting: *Deleted

## 2018-04-27 MED ORDER — ROSUVASTATIN CALCIUM 5 MG PO TABS: 5.0000 mg | ORAL_TABLET | Freq: Every day | ORAL | 3 refills | Status: DC

## 2018-04-27 NOTE — Telephone Encounter (Signed)
Notes recorded by Laurine Blazer, LPN on 3/71/0626 at 9:48 PM EST Patient notified. Copy to pmd. Follow up scheduled for 05/30/2018 with Dr. Paralee Cancel office. Stated that she has been taking the Crestor 5mg  daily since last OV as they discussed her doing this. Refill sent to pharm at patient request.   ------  Notes recorded by Herminio Commons, MD on 04/26/2018 at 4:46 PM EST LDL mildly elevated. Would try taking Crestor 5 mg daily if tolerated. Renal dysfunction is stable and improved from 2 months ago.

## 2018-04-28 ENCOUNTER — Encounter (HOSPITAL_COMMUNITY): Payer: Self-pay | Admitting: Interventional Cardiology

## 2018-04-28 ENCOUNTER — Ambulatory Visit (HOSPITAL_COMMUNITY)
Admission: RE | Admit: 2018-04-28 | Discharge: 2018-04-28 | Disposition: A | Discharge status: Home / Self Care | Payer: Medicare Other | Attending: Cardiovascular Disease | Admitting: Cardiovascular Disease

## 2018-04-28 ENCOUNTER — Encounter (HOSPITAL_COMMUNITY)
Admission: RE | Disposition: A | Discharge status: Home / Self Care | Payer: Self-pay | Source: Home / Self Care | Attending: Cardiovascular Disease

## 2018-04-28 DIAGNOSIS — Z881 Allergy status to other antibiotic agents status: Secondary | ICD-10-CM | POA: Diagnosis not present

## 2018-04-28 DIAGNOSIS — Z7902 Long term (current) use of antithrombotics/antiplatelets: Secondary | ICD-10-CM | POA: Insufficient documentation

## 2018-04-28 DIAGNOSIS — Z7982 Long term (current) use of aspirin: Secondary | ICD-10-CM | POA: Diagnosis not present

## 2018-04-28 DIAGNOSIS — Z888 Allergy status to other drugs, medicaments and biological substances status: Secondary | ICD-10-CM | POA: Insufficient documentation

## 2018-04-28 DIAGNOSIS — I129 Hypertensive chronic kidney disease with stage 1 through stage 4 chronic kidney disease, or unspecified chronic kidney disease: Secondary | ICD-10-CM | POA: Diagnosis not present

## 2018-04-28 DIAGNOSIS — Z6835 Body mass index (BMI) 35.0-35.9, adult: Secondary | ICD-10-CM | POA: Insufficient documentation

## 2018-04-28 DIAGNOSIS — I25119 Atherosclerotic heart disease of native coronary artery with unspecified angina pectoris: Secondary | ICD-10-CM | POA: Diagnosis not present

## 2018-04-28 DIAGNOSIS — K219 Gastro-esophageal reflux disease without esophagitis: Secondary | ICD-10-CM | POA: Insufficient documentation

## 2018-04-28 DIAGNOSIS — Z8673 Personal history of transient ischemic attack (TIA), and cerebral infarction without residual deficits: Secondary | ICD-10-CM | POA: Insufficient documentation

## 2018-04-28 DIAGNOSIS — I2510 Atherosclerotic heart disease of native coronary artery without angina pectoris: Secondary | ICD-10-CM | POA: Diagnosis present

## 2018-04-28 DIAGNOSIS — Z90722 Acquired absence of ovaries, bilateral: Secondary | ICD-10-CM | POA: Insufficient documentation

## 2018-04-28 DIAGNOSIS — N189 Chronic kidney disease, unspecified: Secondary | ICD-10-CM | POA: Diagnosis not present

## 2018-04-28 DIAGNOSIS — E785 Hyperlipidemia, unspecified: Secondary | ICD-10-CM | POA: Insufficient documentation

## 2018-04-28 DIAGNOSIS — J449 Chronic obstructive pulmonary disease, unspecified: Secondary | ICD-10-CM | POA: Diagnosis not present

## 2018-04-28 DIAGNOSIS — Z79890 Hormone replacement therapy (postmenopausal): Secondary | ICD-10-CM | POA: Diagnosis not present

## 2018-04-28 DIAGNOSIS — I10 Essential (primary) hypertension: Secondary | ICD-10-CM | POA: Diagnosis present

## 2018-04-28 DIAGNOSIS — I208 Other forms of angina pectoris: Secondary | ICD-10-CM | POA: Diagnosis present

## 2018-04-28 DIAGNOSIS — Z79899 Other long term (current) drug therapy: Secondary | ICD-10-CM | POA: Insufficient documentation

## 2018-04-28 DIAGNOSIS — Z90710 Acquired absence of both cervix and uterus: Secondary | ICD-10-CM | POA: Insufficient documentation

## 2018-04-28 DIAGNOSIS — I209 Angina pectoris, unspecified: Secondary | ICD-10-CM | POA: Diagnosis present

## 2018-04-28 DIAGNOSIS — E039 Hypothyroidism, unspecified: Secondary | ICD-10-CM | POA: Insufficient documentation

## 2018-04-28 DIAGNOSIS — I2584 Coronary atherosclerosis due to calcified coronary lesion: Secondary | ICD-10-CM | POA: Insufficient documentation

## 2018-04-28 DIAGNOSIS — E669 Obesity, unspecified: Secondary | ICD-10-CM | POA: Diagnosis not present

## 2018-04-28 DIAGNOSIS — G4730 Sleep apnea, unspecified: Secondary | ICD-10-CM | POA: Diagnosis not present

## 2018-04-28 HISTORY — PX: LEFT HEART CATH AND CORONARY ANGIOGRAPHY: CATH118249

## 2018-04-28 SURGERY — LEFT HEART CATH AND CORONARY ANGIOGRAPHY
Anesthesia: LOCAL

## 2018-04-28 MED ORDER — ONDANSETRON HCL 4 MG/2ML IJ SOLN: 4.0000 mg | Freq: Four times a day (QID) | INTRAMUSCULAR | Status: DC | PRN

## 2018-04-28 MED ORDER — SODIUM CHLORIDE 0.9% FLUSH: 3.0000 mL | Freq: Two times a day (BID) | INTRAVENOUS | Status: DC

## 2018-04-28 MED ORDER — HEPARIN SODIUM (PORCINE) 1000 UNIT/ML IJ SOLN
INTRAMUSCULAR | Status: AC
  Filled 2018-04-28: qty 1

## 2018-04-28 MED ORDER — FENTANYL CITRATE (PF) 100 MCG/2ML IJ SOLN
INTRAMUSCULAR | Status: AC
  Filled 2018-04-28: qty 2

## 2018-04-28 MED ORDER — SODIUM CHLORIDE 0.9 % IV SOLN: 250.0000 mL | INTRAVENOUS | Status: DC | PRN

## 2018-04-28 MED ORDER — MIDAZOLAM HCL 2 MG/2ML IJ SOLN
INTRAMUSCULAR | Status: AC
  Filled 2018-04-28: qty 2

## 2018-04-28 MED ORDER — SODIUM CHLORIDE 0.9 % IV SOLN
INTRAVENOUS | Status: DC
  Administered 2018-04-28: 10:00:00 via INTRAVENOUS

## 2018-04-28 MED ORDER — ACETAMINOPHEN 325 MG PO TABS: 650.0000 mg | ORAL_TABLET | ORAL | Status: DC | PRN

## 2018-04-28 MED ORDER — LIDOCAINE HCL (PF) 1 % IJ SOLN
INTRAMUSCULAR | Status: DC | PRN
  Administered 2018-04-28: 2 mL via INTRADERMAL

## 2018-04-28 MED ORDER — MIDAZOLAM HCL 2 MG/2ML IJ SOLN
INTRAMUSCULAR | Status: DC | PRN
  Administered 2018-04-28: 1 mg via INTRAVENOUS

## 2018-04-28 MED ORDER — SODIUM CHLORIDE 0.9 % IV SOLN: INTRAVENOUS | Status: DC

## 2018-04-28 MED ORDER — LIDOCAINE HCL (PF) 1 % IJ SOLN
INTRAMUSCULAR | Status: AC
  Filled 2018-04-28: qty 30

## 2018-04-28 MED ORDER — ASPIRIN 81 MG PO CHEW: 81.0000 mg | CHEWABLE_TABLET | ORAL | Status: DC

## 2018-04-28 MED ORDER — VERAPAMIL HCL 2.5 MG/ML IV SOLN
INTRAVENOUS | Status: AC
  Filled 2018-04-28: qty 2

## 2018-04-28 MED ORDER — VERAPAMIL HCL 2.5 MG/ML IV SOLN
INTRAVENOUS | Status: DC | PRN
  Administered 2018-04-28: 10 mL via INTRA_ARTERIAL

## 2018-04-28 MED ORDER — FENTANYL CITRATE (PF) 100 MCG/2ML IJ SOLN
INTRAMUSCULAR | Status: DC | PRN
  Administered 2018-04-28: 25 ug via INTRAVENOUS

## 2018-04-28 MED ORDER — HEPARIN SODIUM (PORCINE) 1000 UNIT/ML IJ SOLN
INTRAMUSCULAR | Status: DC | PRN
  Administered 2018-04-28: 4000 [IU] via INTRAVENOUS

## 2018-04-28 MED ORDER — SODIUM CHLORIDE 0.9% FLUSH: 3.0000 mL | INTRAVENOUS | Status: DC | PRN

## 2018-04-28 MED ORDER — HEPARIN (PORCINE) IN NACL 1000-0.9 UT/500ML-% IV SOLN
INTRAVENOUS | Status: AC
  Filled 2018-04-28: qty 1000

## 2018-04-28 MED ORDER — HEPARIN (PORCINE) IN NACL 1000-0.9 UT/500ML-% IV SOLN
INTRAVENOUS | Status: DC | PRN
  Administered 2018-04-28 (×2): 500 mL

## 2018-04-28 MED ORDER — IOHEXOL 350 MG/ML SOLN
INTRAVENOUS | Status: DC | PRN
  Administered 2018-04-28: 80 mL via INTRA_ARTERIAL

## 2018-04-28 SURGICAL SUPPLY — 11 items
CATH INFINITI 5 FR JL3.5 (CATHETERS) ×1 IMPLANT
CATH INFINITI JR4 5F (CATHETERS) ×1 IMPLANT
DEVICE RAD COMP TR BAND LRG (VASCULAR PRODUCTS) ×1 IMPLANT
GLIDESHEATH SLEND A-KIT 6F 22G (SHEATH) ×1 IMPLANT
GUIDEWIRE INQWIRE 1.5J.035X260 (WIRE) IMPLANT
INQWIRE 1.5J .035X260CM (WIRE) ×2
KIT HEART LEFT (KITS) ×2 IMPLANT
PACK CARDIAC CATHETERIZATION (CUSTOM PROCEDURE TRAY) ×2 IMPLANT
SHEATH PROBE COVER 6X72 (BAG) ×1 IMPLANT
TRANSDUCER W/STOPCOCK (MISCELLANEOUS) ×2 IMPLANT
TUBING CIL FLEX 10 FLL-RA (TUBING) ×2 IMPLANT

## 2018-04-28 NOTE — Discharge Instructions (Signed)
Radial Site Care ° °This sheet gives you information about how to care for yourself after your procedure. Your health care provider may also give you more specific instructions. If you have problems or questions, contact your health care provider. °What can I expect after the procedure? °After the procedure, it is common to have: °· Bruising and tenderness at the catheter insertion area. °Follow these instructions at home: °Medicines °· Take over-the-counter and prescription medicines only as told by your health care provider. °Insertion site care °· Follow instructions from your health care provider about how to take care of your insertion site. Make sure you: °? Wash your hands with soap and water before you change your bandage (dressing). If soap and water are not available, use hand sanitizer. °? Change your dressing as told by your health care provider. °? Leave stitches (sutures), skin glue, or adhesive strips in place. These skin closures may need to stay in place for 2 weeks or longer. If adhesive strip edges start to loosen and curl up, you may trim the loose edges. Do not remove adhesive strips completely unless your health care provider tells you to do that. °· Check your insertion site every day for signs of infection. Check for: °? Redness, swelling, or pain. °? Fluid or blood. °? Pus or a bad smell. °? Warmth. °· Do not take baths, swim, or use a hot tub until your health care provider approves. °· You may shower 24-48 hours after the procedure, or as directed by your health care provider. °? Remove the dressing and gently wash the site with plain soap and water. °? Pat the area dry with a clean towel. °? Do not rub the site. That could cause bleeding. °· Do not apply powder or lotion to the site. °Activity ° °· For 24 hours after the procedure, or as directed by your health care provider: °? Do not flex or bend the affected arm. °? Do not push or pull heavy objects with the affected arm. °? Do not  drive yourself home from the hospital or clinic. You may drive 24 hours after the procedure unless your health care provider tells you not to. °? Do not operate machinery or power tools. °· Do not lift anything that is heavier than 10 lb (4.5 kg), or the limit that you are told, until your health care provider says that it is safe. °· Ask your health care provider when it is okay to: °? Return to work or school. °? Resume usual physical activities or sports. °? Resume sexual activity. °General instructions °· If the catheter site starts to bleed, raise your arm and put firm pressure on the site. If the bleeding does not stop, get help right away. This is a medical emergency. °· If you went home on the same day as your procedure, a responsible adult should be with you for the first 24 hours after you arrive home. °· Keep all follow-up visits as told by your health care provider. This is important. °Contact a health care provider if: °· You have a fever. °· You have redness, swelling, or yellow drainage around your insertion site. °Get help right away if: °· You have unusual pain at the radial site. °· The catheter insertion area swells very fast. °· The insertion area is bleeding, and the bleeding does not stop when you hold steady pressure on the area. °· Your arm or hand becomes pale, cool, tingly, or numb. °These symptoms may represent a serious problem   that is an emergency. Do not wait to see if the symptoms will go away. Get medical help right away. Call your local emergency services (911 in the [place] Do not drive yourself to the hospital. °Summary °· After the procedure, it is common to have bruising and tenderness at the site. °· Follow instructions from your health care provider about how to take care of your radial site wound. Check the wound every day for signs of infection. °· Do not lift anything that is heavier than 10 lb (4.5 kg), or the limit that you are told, until your health care provider says  that it is safe. °This information is not intended to replace advice given to you by your health care provider. Make sure you discuss any questions you have with your health care provider. °Document Released: 04/18/2010 Document Revised: 04/21/2017 Document Reviewed: 04/21/2017 °Elsevier Interactive Patient Education © 2019 [employer] ° °

## 2018-04-28 NOTE — Interval H&P Note (Signed)
History and Physical Interval Note:  04/28/2018 10:22 AM  Annette Sharp  has presented today for surgery, with the diagnosis of ua  The various methods of treatment have been discussed with the patient and family. After consideration of risks, benefits and other options for treatment, the patient has consented to  Procedure(s): LEFT HEART CATH AND CORONARY ANGIOGRAPHY (N/A) as a surgical intervention .  The patient's history has been reviewed, patient examined, no change in status, stable for surgery.  I have reviewed the patient's chart and labs.  Questions were answered to the patient's satisfaction.     [name]

## 2018-04-28 NOTE — CV Procedure (Signed)
   Heavy three-vessel calcification by cine fluoroscopy.  Angiography performed via right radial approach using ultrasound guidance.  Heavy three-vessel coronary calcification.  30% diffuse left main narrowing  Eccentric 50% proximal LAD before the first diagonal unchanged from prior angiography.  Segmental 40 to 50% proximal LAD beyond the eccentric region of narrowing.  Unchanged from prior.  30% diffuse narrowing of the ostial to proximal circumflex.  Widely patent proximal RCA stent.  Dominant RCA without structural disease.

## 2018-05-30 ENCOUNTER — Ambulatory Visit (INDEPENDENT_AMBULATORY_CARE_PROVIDER_SITE_OTHER): Payer: Medicare Other | Admitting: Cardiovascular Disease

## 2018-05-30 ENCOUNTER — Encounter: Payer: Self-pay | Admitting: Cardiovascular Disease

## 2018-05-30 VITALS — BP 130/76 | HR 63 | Ht 62.0 in | Wt 188.0 lb

## 2018-05-30 DIAGNOSIS — Z8673 Personal history of transient ischemic attack (TIA), and cerebral infarction without residual deficits: Secondary | ICD-10-CM | POA: Diagnosis not present

## 2018-05-30 DIAGNOSIS — Z955 Presence of coronary angioplasty implant and graft: Secondary | ICD-10-CM

## 2018-05-30 DIAGNOSIS — I25118 Atherosclerotic heart disease of native coronary artery with other forms of angina pectoris: Secondary | ICD-10-CM | POA: Diagnosis not present

## 2018-05-30 DIAGNOSIS — I209 Angina pectoris, unspecified: Secondary | ICD-10-CM | POA: Diagnosis not present

## 2018-05-30 DIAGNOSIS — I10 Essential (primary) hypertension: Secondary | ICD-10-CM

## 2018-05-30 DIAGNOSIS — E785 Hyperlipidemia, unspecified: Secondary | ICD-10-CM

## 2018-05-30 MED ORDER — ROSUVASTATIN CALCIUM 10 MG PO TABS: 10.0000 mg | ORAL_TABLET | Freq: Every day | ORAL | 6 refills | Status: DC

## 2018-05-30 NOTE — Patient Instructions (Signed)
Medication Instructions:   Increase Crestor to 10mg  daily.   Continue all other medications.    Labwork:  Lipids - due in 3 months.   Will mail reminder when time.    Follow-Up: Your physician wants you to follow up in: 6 months.  You will receive a reminder letter in the mail one-two months in advance.  If you don't receive a letter, please call our office to schedule the follow up appointment   Any Other Special Instructions Will Be Listed Below (If Applicable).  If you need a refill on your cardiac medications before your next appointment, please call your pharmacy.

## 2018-05-30 NOTE — Progress Notes (Signed)
SUBJECTIVE: The patient presents for follow-up after undergoing cardiac catheterization.  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  Aggressive medical therapy was recommended.  Cardiac catheterization results reviewed below.  She is here with her husband.  She said she is doing much better with less frequent and less severe episodes of angina.  Her weakness is also resolved.  She has been taking Crestor 5 mg daily and thinks previous myalgias had nothing to do with statin therapy.  We reviewed her lipid results.      Review of Systems: As per "subjective", otherwise negative.  Allergies  Allergen Reactions  . Ace Inhibitors Cough  . Gemfibrozil Other (See Comments)    Muscle Aches  . Moxifloxacin Rash  . Ranexa [Ranolazine] Other (See Comments)    "NIGHTMARES"    Current Outpatient Medications  Medication Sig Dispense Refill  . acyclovir (ZOVIRAX) 400 MG tablet Take 400 mg by mouth daily.    Marland Kitchen albuterol (PROVENTIL HFA;VENTOLIN HFA) 108 (90 Base) MCG/ACT inhaler Inhale 2 puffs into the lungs every 6 (six) hours as needed for wheezing or shortness of breath. 1 Inhaler 2  . amLODipine (NORVASC) 10 MG tablet Take 10 mg by mouth every morning.  3  . aspirin EC 81 MG tablet Take 1 tablet (81 mg total) by mouth at bedtime.    . betamethasone dipropionate (DIPROLENE) 0.05 % cream Apply 1 application topically 2 (two) times daily as needed (atrophic vaginitis).     . Cholecalciferol (VITAMIN D3) 50 MCG (2000 UT) TABS Take 2,000 Units by mouth daily.    . citalopram (CELEXA) 20 MG tablet Take 20 mg by mouth every morning.     . clopidogrel (PLAVIX) 75 MG tablet Take 1 tablet (75 mg total) by mouth every morning.    . dicyclomine (BENTYL) 10 MG capsule Take 1 capsule (10 mg total) by mouth 2 (two) times daily before a meal. 60 capsule 2  . isosorbide mononitrate (IMDUR) 60 MG 24 hr tablet Take 90 mg by mouth 2 (two) times daily.    Marland Kitchen  levothyroxine (SYNTHROID, LEVOTHROID) 88 MCG tablet Take 88 mcg by mouth every morning.     . Melatonin 5 MG SUBL Take 5 mg by mouth at bedtime.     . nitroGLYCERIN (NITROSTAT) 0.4 MG SL tablet Place 1 tablet (0.4 mg total) under the tongue every 5 (five) minutes x 3 doses as needed for chest pain (if no relief after 3rd dose, proceed to the ED for an evaluation). 25 tablet 3  . omeprazole (PRILOSEC) 20 MG capsule Take 20 mg by mouth daily as needed (acid reflux/indigestion.).     Marland Kitchen polyethylene glycol (MIRALAX / GLYCOLAX) packet 17grams in 16 oz of water twice a day until bowel movement.  LAXITIVE.  Restart if two days since last bowel movement (Patient taking differently: Take 17 g by mouth daily as needed for mild constipation. 17grams in 16 oz of water twice a day until bowel movement.  LAXITIVE.  Restart if two days since last bowel movement) 60 each 0  . rosuvastatin (CRESTOR) 5 MG tablet Take 1 tablet (5 mg total) by mouth daily. Dose increased 04/27/18 90 tablet 3  . vitamin B-12 (CYANOCOBALAMIN) 1000 MCG tablet Take 1,000 mcg by mouth daily.    . Wheat Dextrin (BENEFIBER DRINK MIX) PACK Take 4 g by mouth at bedtime.     No current facility-administered medications for this visit.  Past Medical History:  Diagnosis Date  . Anxiety   . Back pain   . Cerebrovascular disease    NONOBSTRUCTIVE CVA BY CAROTID DOPPLERS OCTOBER 2008  . Chronic renal insufficiency    GFR 45 ML'S PER MINUTE  . COPD (chronic obstructive pulmonary disease) ([place])   . Coronary artery disease    a. s/p DES to RCA in 2007 b. patent stent by cath in 12/2014 with nonobstructive disease along LAD and RCA  . Coronary atherosclerosis of native coronary artery    NEGATIVE LEXISCAN FOR ISCHEMIA MARCH 23,2010 NORMAL LV FUNCTTION  . Depression   . Dyspnea    with exertion  . GERD (gastroesophageal reflux disease)   . Headache    chronic headache from fall in December  . Hyperlipidemia   . Hypertension   .  Hypothyroidism   . Other and unspecified angina pectoris    ANGINA  . Overweight(278.02)    OBESITY  . Peptic ulcer disease   . Primary localized osteoarthritis of left knee   . Sleep apnea    SEVERE OBSTRUCTIVE    cpap  . Stroke [facility])    affected memory  . Thyroid disease    TREATED HYPOTHYROIDISM    Past Surgical History:  Procedure Laterality Date  . ABDOMINAL HYSTERECTOMY    . CARDIAC CATHETERIZATION N/A 01/22/2015   Procedure: Right/Left Heart Cath and Coronary Angiography;  Surgeon: Wellington Hampshire, MD;  Location: [place] [facility];  Service: Cardiovascular;  Laterality: N/A;  . CARPAL TUNNEL RELEASE    . CHOLECYSTECTOMY    . COLONOSCOPY N/A 03/31/2018   Procedure: COLONOSCOPY;  Surgeon: Rogene Houston, MD;  [facility];  Service: Endoscopy;  Laterality: N/A;  2:45  . JOINT REPLACEMENT    . LEFT HEART CATH AND CORONARY ANGIOGRAPHY N/A 04/28/2018   Procedure: LEFT HEART CATH AND CORONARY ANGIOGRAPHY;  Surgeon: Belva Crome, MD;  Location: [place] [facility];  Service: Cardiovascular;  Laterality: N/A;  . POLYPECTOMY  03/31/2018   Procedure: POLYPECTOMY;  Surgeon: Rogene Houston, MD;  [facility]: [facility];  Service: Endoscopy;;  splen. flex, cecum  . 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 ROTATOR CUFF REPAIR Right 07/08/2009  . TOTAL ABDOMINAL HYSTERECTOMY W/ BILATERAL SALPINGOOPHORECTOMY    . TOTAL KNEE ARTHROPLASTY Left 04/08/2015   Procedure: LEFT TOTAL KNEE ARTHROPLASTY;  Surgeon: Elsie Saas, MD;  Location: [place];  Service: Orthopedics;  Laterality: Left;  . TOTAL SHOULDER ARTHROPLASTY Left 06/02/2017   Procedure: TOTAL SHOULDER ARTHROPLASTY;  Surgeon: Hiram Gash, MD;  Location: [place];  Service: Orthopedics;  Laterality: Left;    Social History   Socioeconomic History  . Marital status: Married    Spouse name: Not on file  . Number of children: Not on file  . Years of education: Not on file  . Highest education level: Not on file    Occupational History  . Not on file  Social Needs  . Financial resource strain: Not on file  . Food insecurity:    Worry: Not on file    Inability: Not on file  . Transportation needs:    Medical: Not on file    Non-medical: Not on file  Tobacco Use  . Smoking status: Never Smoker  . Smokeless tobacco: Never Used  Substance and Sexual Activity  . Alcohol use: No    Alcohol/week: 0.0 standard drinks  . Drug use: No  . Sexual activity: Never  Lifestyle  . Physical activity:  Days per week: Not on file    Minutes per session: Not on file  . Stress: Not on file  Relationships  . Social connections:    Talks on phone: Not on file    Gets together: Not on file    Attends religious service: Not on file    Active member of club or organization: Not on file    Attends meetings of clubs or organizations: Not on file    Relationship status: Not on file  . Intimate partner violence:    Fear of current or ex partner: Not on file    Emotionally abused: Not on file    Physically abused: Not on file    Forced sexual activity: Not on file  Other Topics Concern  . Not on file  Social History Narrative  . Not on file     Vitals:   05/30/18 1420  BP: 130/76  Pulse: 63  SpO2: 95%  Weight: 188 lb (85.3 kg)  Height: 5\' 2"  (1.575 m)    Wt Readings from Last 3 Encounters:  05/30/18 188 lb (85.3 kg)  04/28/18 192 lb (87.1 kg)  04/21/18 192 lb 3.2 oz (87.2 kg)     PHYSICAL EXAM General: NAD HEENT: Normal. Neck: No JVD, no thyromegaly. Lungs: Clear to auscultation bilaterally with normal respiratory effort. CV: Regular rate and rhythm, normal S1/S2, no S3/S4, no murmur. No pretibial or periankle edema.  No carotid bruit.   Abdomen: Soft, nontender, no distention.  Neurologic: Alert and oriented.  Psych: Normal affect. Skin: Normal. Musculoskeletal: No gross deformities.    ECG: Reviewed above under Subjective   Labs: Lab Results  Component Value Date/Time   K  4.4 04/25/2018 09:49 AM   BUN 15 04/25/2018 09:49 AM   CREATININE 1.25 (H) 04/25/2018 09:49 AM   ALT 18 05/25/2015 04:40 PM   TSH 1.122 05/25/2015 04:40 PM   HGB 14.2 04/25/2018 09:49 AM     Lipids: Lab Results  Component Value Date/Time   LDLCALC 93 04/25/2018 09:49 AM   CHOL 168 04/25/2018 09:49 AM   TRIG 65 04/25/2018 09:49 AM   HDL 60 04/25/2018 09:49 AM      Cardiac catheterization 04/28/2018:   Moderate three-vessel coronary disease, with diffuse proximal to mid calcification in all territories.  Left main 25% diffuse narrowing  Proximal to mid LAD 40 to 50%.  Unchanged from prior angiogram  Proximal 30% circumflex.  First OM is small with irregularities.  RCA contains a proximal to ostial stent that has 30% proximal narrowing.  Luminal irregularities are noted throughout the mid right coronary.  No significant obstruction is seen.  Normal left ventricular function with EF greater than 50%.  LVEDP is normal.  ASSESSMENT AND PLAN:  1.  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  RCA has 30% proximal to ostial stent stenosis.  Aggressive medical therapy was recommended.  Cardiac catheterization results reviewed above.  Currently on aspirin, amlodipine, Plavix, Imdur, and statin.  I will increase Crestor to 10 mg to lower LDL.  2. Hypertension: Blood pressure is normal. No changes to therapy.  3. Hyperlipidemia: LDL 114 on 07/20/2017.  Repeat lipids on 04/25/2018 showed LDL 93 (full lipid panel reviewed above).  She has a history of statin intolerance but has been taking Crestor 5 mg daily.  She now believes prior myalgias had nothing to do with statin therapy.  Will increase Crestor to 10 mg and repeat lipids  in 3 months.  4. History of CVA: She is on Plavix and statin.  I will increase statin therapy to 10 mg to reduce LDL.   Disposition: Follow up 6 months   Kate Sable, M.D., F.A.C.C.

## 2018-06-03 ENCOUNTER — Other Ambulatory Visit: Payer: Self-pay | Admitting: Cardiovascular Disease

## 2018-06-03 MED ORDER — ISOSORBIDE MONONITRATE ER 60 MG PO TB24: 90.0000 mg | ORAL_TABLET | Freq: Two times a day (BID) | ORAL | 2 refills | Status: DC

## 2018-06-03 NOTE — Telephone Encounter (Signed)
°*  STAT* If patient is at the pharmacy, call can be transferred to refill team.   1. Which medications need to be refilled? isosorbide mononitrate (IMDUR) 60 MG 24 hr tablet    Patient has only been taking 1-1/2 instead of Take 90 mg by mouth 2 (two) times daily. - Oral  2. Which pharmacy/location (including street and city if local pharmacy) is medication to be sent to? [facility]  3. Do they need a 30 day or 90 day supply?

## 2018-06-09 DIAGNOSIS — M19012 Primary osteoarthritis, left shoulder: Secondary | ICD-10-CM | POA: Diagnosis not present

## 2018-06-22 ENCOUNTER — Other Ambulatory Visit (INDEPENDENT_AMBULATORY_CARE_PROVIDER_SITE_OTHER): Payer: Self-pay | Admitting: Internal Medicine

## 2018-07-19 ENCOUNTER — Other Ambulatory Visit (INDEPENDENT_AMBULATORY_CARE_PROVIDER_SITE_OTHER): Payer: Self-pay | Admitting: Internal Medicine

## 2018-07-19 NOTE — Telephone Encounter (Signed)
Per Dr.Rehman the patient will need to have OV in 6 months.

## 2018-08-11 DIAGNOSIS — D519 Vitamin B12 deficiency anemia, unspecified: Secondary | ICD-10-CM | POA: Diagnosis not present

## 2018-08-11 DIAGNOSIS — J449 Chronic obstructive pulmonary disease, unspecified: Secondary | ICD-10-CM | POA: Diagnosis not present

## 2018-08-11 DIAGNOSIS — N183 Chronic kidney disease, stage 3 (moderate): Secondary | ICD-10-CM | POA: Diagnosis not present

## 2018-08-11 DIAGNOSIS — R202 Paresthesia of skin: Secondary | ICD-10-CM | POA: Diagnosis not present

## 2018-08-11 DIAGNOSIS — E876 Hypokalemia: Secondary | ICD-10-CM | POA: Diagnosis not present

## 2018-08-11 DIAGNOSIS — I639 Cerebral infarction, unspecified: Secondary | ICD-10-CM | POA: Diagnosis not present

## 2018-08-11 DIAGNOSIS — E039 Hypothyroidism, unspecified: Secondary | ICD-10-CM | POA: Diagnosis not present

## 2018-08-11 DIAGNOSIS — I10 Essential (primary) hypertension: Secondary | ICD-10-CM | POA: Diagnosis not present

## 2018-08-11 DIAGNOSIS — R001 Bradycardia, unspecified: Secondary | ICD-10-CM | POA: Diagnosis not present

## 2018-08-11 DIAGNOSIS — R5383 Other fatigue: Secondary | ICD-10-CM | POA: Diagnosis not present

## 2018-08-11 DIAGNOSIS — I259 Chronic ischemic heart disease, unspecified: Secondary | ICD-10-CM | POA: Diagnosis not present

## 2018-08-11 DIAGNOSIS — G3184 Mild cognitive impairment, so stated: Secondary | ICD-10-CM | POA: Diagnosis not present

## 2018-08-16 DIAGNOSIS — K589 Irritable bowel syndrome without diarrhea: Secondary | ICD-10-CM | POA: Diagnosis not present

## 2018-08-16 DIAGNOSIS — I10 Essential (primary) hypertension: Secondary | ICD-10-CM | POA: Diagnosis not present

## 2018-08-16 DIAGNOSIS — I208 Other forms of angina pectoris: Secondary | ICD-10-CM | POA: Diagnosis not present

## 2018-08-16 DIAGNOSIS — R42 Dizziness and giddiness: Secondary | ICD-10-CM | POA: Diagnosis not present

## 2018-08-16 DIAGNOSIS — N183 Chronic kidney disease, stage 3 (moderate): Secondary | ICD-10-CM | POA: Diagnosis not present

## 2018-08-16 DIAGNOSIS — E039 Hypothyroidism, unspecified: Secondary | ICD-10-CM | POA: Diagnosis not present

## 2018-08-16 DIAGNOSIS — R252 Cramp and spasm: Secondary | ICD-10-CM | POA: Diagnosis not present

## 2018-08-16 DIAGNOSIS — Z6835 Body mass index (BMI) 35.0-35.9, adult: Secondary | ICD-10-CM | POA: Diagnosis not present

## 2018-08-29 ENCOUNTER — Encounter: Payer: Self-pay | Admitting: *Deleted

## 2018-08-29 ENCOUNTER — Other Ambulatory Visit: Payer: Self-pay | Admitting: *Deleted

## 2018-08-29 DIAGNOSIS — E785 Hyperlipidemia, unspecified: Secondary | ICD-10-CM

## 2018-09-09 ENCOUNTER — Other Ambulatory Visit: Payer: Self-pay | Admitting: Cardiovascular Disease

## 2018-09-09 DIAGNOSIS — E785 Hyperlipidemia, unspecified: Secondary | ICD-10-CM | POA: Diagnosis not present

## 2018-09-10 LAB — LIPID PANEL
Cholesterol: 157 mg/dL (ref <200)
HDL: 61 mg/dL (ref >=50)
LDL Cholesterol (Calc): 82 mg/dL (calc)
Non-HDL Cholesterol (Calc): 96 mg/dL (calc) (ref <130)
Total CHOL/HDL Ratio: 2.6 (calc) (ref <5.0)
Triglycerides: 60 mg/dL (ref <150)

## 2018-09-15 ENCOUNTER — Telehealth: Payer: Self-pay | Admitting: *Deleted

## 2018-09-15 ENCOUNTER — Encounter: Payer: Self-pay | Admitting: *Deleted

## 2018-09-15 MED ORDER — ROSUVASTATIN CALCIUM 20 MG PO TABS: 20.0000 mg | ORAL_TABLET | Freq: Every day | ORAL | 3 refills | Status: DC

## 2018-09-15 NOTE — Telephone Encounter (Signed)
Notes recorded by Laurine Blazer, LPN on 2/71/2929 at 0:90 PM EDT  Patient notified. She is willing to increase the Crestor to 20mg  daily. New prescription sent to pharmacy today. Next follow up is September.  ------   Notes recorded by Arnoldo Lenis, MD on 09/15/2018 at 12:38 PM EDT  Dr Raliegh Ip patient, Cholesterol not quite at goal, if tolerating crestor 10mg  without side effects would increase to 20mg  daily.   J BrancH MD

## 2018-09-27 DIAGNOSIS — M26629 Arthralgia of temporomandibular joint, unspecified side: Secondary | ICD-10-CM | POA: Diagnosis not present

## 2018-09-27 DIAGNOSIS — Z6834 Body mass index (BMI) 34.0-34.9, adult: Secondary | ICD-10-CM | POA: Diagnosis not present

## 2018-10-29 ENCOUNTER — Other Ambulatory Visit: Payer: Self-pay | Admitting: Cardiovascular Disease

## 2018-11-12 DIAGNOSIS — H40029 Open angle with borderline findings, high risk, unspecified eye: Secondary | ICD-10-CM | POA: Diagnosis not present

## 2018-12-06 ENCOUNTER — Encounter: Payer: Self-pay | Admitting: Cardiovascular Disease

## 2018-12-06 ENCOUNTER — Ambulatory Visit (INDEPENDENT_AMBULATORY_CARE_PROVIDER_SITE_OTHER): Payer: Medicare Other | Admitting: Cardiovascular Disease

## 2018-12-06 ENCOUNTER — Other Ambulatory Visit: Payer: Self-pay

## 2018-12-06 VITALS — BP 128/68 | HR 58 | Temp 98.7°F | Ht 62.0 in | Wt 183.0 lb

## 2018-12-06 DIAGNOSIS — Z955 Presence of coronary angioplasty implant and graft: Secondary | ICD-10-CM | POA: Diagnosis not present

## 2018-12-06 DIAGNOSIS — R5383 Other fatigue: Secondary | ICD-10-CM

## 2018-12-06 DIAGNOSIS — I10 Essential (primary) hypertension: Secondary | ICD-10-CM

## 2018-12-06 DIAGNOSIS — Z8673 Personal history of transient ischemic attack (TIA), and cerebral infarction without residual deficits: Secondary | ICD-10-CM | POA: Diagnosis not present

## 2018-12-06 DIAGNOSIS — E785 Hyperlipidemia, unspecified: Secondary | ICD-10-CM

## 2018-12-06 DIAGNOSIS — I25118 Atherosclerotic heart disease of native coronary artery with other forms of angina pectoris: Secondary | ICD-10-CM

## 2018-12-06 DIAGNOSIS — G4733 Obstructive sleep apnea (adult) (pediatric): Secondary | ICD-10-CM | POA: Diagnosis not present

## 2018-12-06 DIAGNOSIS — I209 Angina pectoris, unspecified: Secondary | ICD-10-CM | POA: Diagnosis not present

## 2018-12-06 NOTE — Patient Instructions (Signed)

## 2018-12-06 NOTE — Progress Notes (Signed)
SUBJECTIVE: The patient presents for routine follow-up.  She underwent cardiac catheterization in January 2020 and is deemed to have INOCA (ischemia with no obstructive coronary artery disease).  Aggressive medical therapy was recommended.  She denies chest pain, leg swelling, shortness of breath.  Primary complaints relate to daytime somnolence and fatigue.  She has a history of sleep apnea but has been unable to tolerate any of the masks.  She has decreased appetite as well.  I reviewed labs performed by her PCP in May 2020 and renal function, hemoglobin, and thyroid function were all unremarkable.    Review of Systems: As per "subjective", otherwise negative.  Allergies  Allergen Reactions  . Ace Inhibitors Cough  . Gemfibrozil Other (See Comments)    Muscle Aches  . Moxifloxacin Rash  . Ranexa [Ranolazine] Other (See Comments)    "NIGHTMARES"    Current Outpatient Medications  Medication Sig Dispense Refill  . acyclovir (ZOVIRAX) 400 MG tablet Take 400 mg by mouth daily.    Marland Kitchen albuterol (PROVENTIL HFA;VENTOLIN HFA) 108 (90 Base) MCG/ACT inhaler Inhale 2 puffs into the lungs every 6 (six) hours as needed for wheezing or shortness of breath. 1 Inhaler 2  . amLODipine (NORVASC) 10 MG tablet Take 10 mg by mouth every morning.  3  . aspirin EC 81 MG tablet Take 1 tablet (81 mg total) by mouth at bedtime.    . betamethasone dipropionate (DIPROLENE) 0.05 % cream Apply 1 application topically 2 (two) times daily as needed (atrophic vaginitis).     . Cholecalciferol (VITAMIN D3) 50 MCG (2000 UT) TABS Take 2,000 Units by mouth daily.    . citalopram (CELEXA) 20 MG tablet Take 20 mg by mouth every morning.     . clopidogrel (PLAVIX) 75 MG tablet Take 1 tablet (75 mg total) by mouth every morning.    . dicyclomine (BENTYL) 10 MG capsule TAKE 1 CAPSULE (10 MG TOTAL) BY MOUTH 2 (TWO) TIMES DAILY BEFORE A MEAL. 60 capsule 5  . isosorbide mononitrate (IMDUR) 60 MG 24 hr tablet TAKE  1.5 TABLETS (90 MG TOTAL) BY MOUTH 2 (TWO) TIMES DAILY. 270 tablet 0  . levothyroxine (SYNTHROID, LEVOTHROID) 88 MCG tablet Take 88 mcg by mouth every morning.     . Magnesium Oxide (MAG-OXIDE PO) Take 500 mg by mouth.    . Melatonin 5 MG SUBL Take 5 mg by mouth at bedtime.     . nitroGLYCERIN (NITROSTAT) 0.4 MG SL tablet Place 1 tablet (0.4 mg total) under the tongue every 5 (five) minutes x 3 doses as needed for chest pain (if no relief after 3rd dose, proceed to the ED for an evaluation). 25 tablet 3  . omeprazole (PRILOSEC) 20 MG capsule Take 20 mg by mouth daily as needed (acid reflux/indigestion.).     Marland Kitchen polyethylene glycol (MIRALAX / GLYCOLAX) packet 17grams in 16 oz of water twice a day until bowel movement.  LAXITIVE.  Restart if two days since last bowel movement (Patient taking differently: Take 17 g by mouth daily as needed for mild constipation. 17grams in 16 oz of water twice a day until bowel movement.  LAXITIVE.  Restart if two days since last bowel movement) 60 each 0  . Potassium 99 MG TABS Take by mouth.    . rosuvastatin (CRESTOR) 20 MG tablet Take 1 tablet (20 mg total) by mouth daily. 90 tablet 3  . vitamin B-12 (CYANOCOBALAMIN) 1000 MCG tablet Take 1,000 mcg by mouth daily.    Marland Kitchen  Wheat Dextrin (BENEFIBER DRINK MIX) PACK Take 4 g by mouth at bedtime.     No current facility-administered medications for this visit.     Past Medical History:  Diagnosis Date  . Anxiety   . Back pain   . Cerebrovascular disease    NONOBSTRUCTIVE CVA BY CAROTID DOPPLERS OCTOBER 2008  . Chronic renal insufficiency    GFR 45 ML'S PER MINUTE  . COPD (chronic obstructive pulmonary disease) ([place])   . Coronary artery disease    a. s/p DES to RCA in 2007 b. patent stent by cath in 12/2014 with nonobstructive disease along LAD and RCA  . Coronary atherosclerosis of native coronary artery    NEGATIVE LEXISCAN FOR ISCHEMIA MARCH 23,2010 NORMAL LV FUNCTTION  . Depression   . Dyspnea    with  exertion  . GERD (gastroesophageal reflux disease)   . Headache    chronic headache from fall in December  . Hyperlipidemia   . Hypertension   . Hypothyroidism   . Other and unspecified angina pectoris    ANGINA  . Overweight(278.02)    OBESITY  . Peptic ulcer disease   . Primary localized osteoarthritis of left knee   . Sleep apnea    SEVERE OBSTRUCTIVE    cpap  . Stroke [facility])    affected memory  . Thyroid disease    TREATED HYPOTHYROIDISM    Past Surgical History:  Procedure Laterality Date  . ABDOMINAL HYSTERECTOMY    . CARDIAC CATHETERIZATION N/A 01/22/2015   Procedure: Right/Left Heart Cath and Coronary Angiography;  Surgeon: Wellington Hampshire, MD;  Location: [facility];  Service: Cardiovascular;  Laterality: N/A;  . CARPAL TUNNEL RELEASE    . CHOLECYSTECTOMY    . COLONOSCOPY N/A 03/31/2018   Procedure: COLONOSCOPY;  Surgeon: Rogene Houston, MD;  [facility];  Service: Endoscopy;  Laterality: N/A;  2:45  . JOINT REPLACEMENT    . LEFT HEART CATH AND CORONARY ANGIOGRAPHY N/A 04/28/2018   Procedure: LEFT HEART CATH AND CORONARY ANGIOGRAPHY;  Surgeon: Belva Crome, MD;  Location: [place] [facility];  Service: Cardiovascular;  Laterality: N/A;  . POLYPECTOMY  03/31/2018   Procedure: POLYPECTOMY;  Surgeon: Rogene Houston, MD;  [facility];  Service: Endoscopy;;  splen. flex, cecum  . 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 ROTATOR CUFF REPAIR Right 07/08/2009  . TOTAL ABDOMINAL HYSTERECTOMY W/ BILATERAL SALPINGOOPHORECTOMY    . TOTAL KNEE ARTHROPLASTY Left 04/08/2015   Procedure: LEFT TOTAL KNEE ARTHROPLASTY;  Surgeon: Elsie Saas, MD;  Location: [facility];  Service: Orthopedics;  Laterality: Left;  . TOTAL SHOULDER ARTHROPLASTY Left 06/02/2017   Procedure: TOTAL SHOULDER ARTHROPLASTY;  Surgeon: Hiram Gash, MD;  Location: [facility];  Service: Orthopedics;  Laterality: Left;    Social History   Socioeconomic History  . Marital status: Married     Spouse name: Not on file  . Number of children: Not on file  . Years of education: Not on file  . Highest education level: Not on file  Occupational History  . Not on file  Social Needs  . Financial resource strain: Not on file  . Food insecurity    Worry: Not on file    Inability: Not on file  . Transportation needs    Medical: Not on file    Non-medical: Not on file  Tobacco Use  . Smoking status: Never Smoker  . Smokeless tobacco: Never Used  Substance and Sexual Activity  .  Alcohol use: No    Alcohol/week: 0.0 standard drinks  . Drug use: No  . Sexual activity: Never  Lifestyle  . Physical activity    Days per week: Not on file    Minutes per session: Not on file  . Stress: Not on file  Relationships  . Social Herbalist on phone: Not on file    Gets together: Not on file    Attends religious service: Not on file    Active member of club or organization: Not on file    Attends meetings of clubs or organizations: Not on file    Relationship status: Not on file  . Intimate partner violence    Fear of current or ex partner: Not on file    Emotionally abused: Not on file    Physically abused: Not on file    Forced sexual activity: Not on file  Other Topics Concern  . Not on file  Social History Narrative  . Not on file     Vitals:   12/06/18 1310  BP: 128/68  Pulse: (!) 58  Temp: 98.7 F (37.1 C)  SpO2: 98%  Weight: 183 lb (83 kg)  Height: 5\' 2"  (1.575 m)    Wt Readings from Last 3 Encounters:  12/06/18 183 lb (83 kg)  05/30/18 188 lb (85.3 kg)  04/28/18 192 lb (87.1 kg)     PHYSICAL EXAM General: NAD HEENT: Normal. Neck: No JVD, no thyromegaly. Lungs: Clear to auscultation bilaterally with normal respiratory effort. CV: Regular rate and rhythm, normal S1/S2, no S3/S4, no murmur. No pretibial or periankle edema.  No carotid bruit.   Abdomen: Soft, nontender, no distention.  Neurologic: Alert and oriented.  Psych: Normal affect. Skin:  Normal. Musculoskeletal: No gross deformities.      Labs: Lab Results  Component Value Date/Time   K 4.4 04/25/2018 09:49 AM   BUN 15 04/25/2018 09:49 AM   CREATININE 1.25 (H) 04/25/2018 09:49 AM   ALT 18 05/25/2015 04:40 PM   TSH 1.122 05/25/2015 04:40 PM   HGB 14.2 04/25/2018 09:49 AM     Lipids: Lab Results  Component Value Date/Time   LDLCALC 82 09/09/2018 09:41 AM   CHOL 157 09/09/2018 09:41 AM   TRIG 60 09/09/2018 09:41 AM   HDL 61 09/09/2018 09:41 AM     Cardiac catheterization 04/28/2018:   Moderate three-vessel coronary disease, with diffuse proximal to mid calcification in all territories.  Left main 25% diffuse narrowing  Proximal to mid LAD 40 to 50%. Unchanged from prior angiogram  Proximal 30% circumflex. First OM is small with irregularities.  RCA contains a proximal to ostial stent that has 30% proximal narrowing. Luminal irregularities are noted throughout the mid right coronary. No significant obstruction is seen.  Normal left ventricular function with EF greater than 50%. LVEDP is normal.  ASSESSMENT AND PLAN: 1.  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  RCA has 30% proximal to ostial stent stenosis.  Aggressive medical therapy was recommended.  Cardiac catheterization results reviewed above.  Currently on aspirin, amlodipine, Plavix, Imdur, and rosuvastatin.  2. Hypertension: Blood pressure is normal. No changes to therapy.  3. Hyperlipidemia:Lipids reviewed above.  Continue rosuvastatin 20 mg daily.  4. History of CVA: She is on Plavix and statin.   5.  Sleep apnea/fatigue: I suspect her symptoms are related to untreated sleep apnea as she has been unable to tolerate any masks thus  far.  I offered to refer her to a sleep specialist in [place] or speak with her PCP about this.  She will talk to her PCP at her next appointment.    Disposition: Follow up 1 yr.    Kate Sable, M.D., F.A.C.C.

## 2018-12-12 DIAGNOSIS — Z23 Encounter for immunization: Secondary | ICD-10-CM | POA: Diagnosis not present

## 2018-12-13 DIAGNOSIS — I10 Essential (primary) hypertension: Secondary | ICD-10-CM | POA: Diagnosis not present

## 2018-12-13 DIAGNOSIS — R42 Dizziness and giddiness: Secondary | ICD-10-CM | POA: Diagnosis not present

## 2018-12-13 DIAGNOSIS — E039 Hypothyroidism, unspecified: Secondary | ICD-10-CM | POA: Diagnosis not present

## 2018-12-13 DIAGNOSIS — N183 Chronic kidney disease, stage 3 (moderate): Secondary | ICD-10-CM | POA: Diagnosis not present

## 2018-12-13 DIAGNOSIS — E7800 Pure hypercholesterolemia, unspecified: Secondary | ICD-10-CM | POA: Diagnosis not present

## 2018-12-13 DIAGNOSIS — K219 Gastro-esophageal reflux disease without esophagitis: Secondary | ICD-10-CM | POA: Diagnosis not present

## 2018-12-13 DIAGNOSIS — E876 Hypokalemia: Secondary | ICD-10-CM | POA: Diagnosis not present

## 2018-12-15 DIAGNOSIS — I10 Essential (primary) hypertension: Secondary | ICD-10-CM | POA: Diagnosis not present

## 2018-12-15 DIAGNOSIS — N183 Chronic kidney disease, stage 3 (moderate): Secondary | ICD-10-CM | POA: Diagnosis not present

## 2018-12-15 DIAGNOSIS — I208 Other forms of angina pectoris: Secondary | ICD-10-CM | POA: Diagnosis not present

## 2018-12-15 DIAGNOSIS — Z6834 Body mass index (BMI) 34.0-34.9, adult: Secondary | ICD-10-CM | POA: Diagnosis not present

## 2018-12-15 DIAGNOSIS — Z1389 Encounter for screening for other disorder: Secondary | ICD-10-CM | POA: Diagnosis not present

## 2018-12-15 DIAGNOSIS — R42 Dizziness and giddiness: Secondary | ICD-10-CM | POA: Diagnosis not present

## 2018-12-15 DIAGNOSIS — Z1331 Encounter for screening for depression: Secondary | ICD-10-CM | POA: Diagnosis not present

## 2018-12-15 DIAGNOSIS — R252 Cramp and spasm: Secondary | ICD-10-CM | POA: Diagnosis not present

## 2019-01-24 ENCOUNTER — Other Ambulatory Visit: Payer: Self-pay

## 2019-01-24 ENCOUNTER — Ambulatory Visit (INDEPENDENT_AMBULATORY_CARE_PROVIDER_SITE_OTHER): Payer: Medicare Other | Admitting: Internal Medicine

## 2019-01-24 ENCOUNTER — Encounter (INDEPENDENT_AMBULATORY_CARE_PROVIDER_SITE_OTHER): Payer: Self-pay | Admitting: Internal Medicine

## 2019-01-24 VITALS — BP 150/70 | HR 59 | Temp 97.5°F | Ht 62.0 in | Wt 182.0 lb

## 2019-01-24 DIAGNOSIS — K580 Irritable bowel syndrome with diarrhea: Secondary | ICD-10-CM | POA: Diagnosis not present

## 2019-01-24 DIAGNOSIS — Z86010 Personal history of colonic polyps: Secondary | ICD-10-CM

## 2019-01-24 DIAGNOSIS — R634 Abnormal weight loss: Secondary | ICD-10-CM | POA: Insufficient documentation

## 2019-01-24 DIAGNOSIS — I209 Angina pectoris, unspecified: Secondary | ICD-10-CM

## 2019-01-24 NOTE — Patient Instructions (Signed)
Notify if abdominal pain becomes more frequent or intense. Take dicyclomine 10 mg daily before breakfast and use second dose on as-needed basis. Weight check in 8 weeks.

## 2019-01-24 NOTE — Progress Notes (Signed)
Presenting complaint;  Follow-up for IBS.  Database and subjective:  Patient is 78 year old Caucasian female with multiple medical problems was evaluated in October last year for diarrhea abdominal cramping.  She also has a history of colonic polyps.  She had 5 small polyps removed and these were all tubular adenomas and one polyp had low-grade dysplasia.  She also has sigmoid colon diverticulosis but no endoscopic colitis.  Patient was begun on Benefiber and dicyclomine 10 mg twice daily. She was not able to come earlier because of Covid-19 pandemic.  She states she is definitely feeling better.  She has noted dry mouth but she states she is has had dry mouth before because she sleeps with her mouth open.  She has not experienced drowsiness.  She is not having diarrhea anymore.  Her bowels move daily or every other day.  She is still noticed some cramping in right lower quadrant.  She may have an episode once a week.  She says when this occurs she just goes in lies in bed and sleeps without.  When she has pain she also has urgency.  She denies melena or rectal bleeding.  She states she does not have a good appetite.  She has lost 10 pounds in 1 year.  She says she is not trying to lose weight.  She is not able to do much walking because of breathing problems.  She says she generally eats 1-2 meals per day.  She does not have nausea or vomiting.  Current Medications: Outpatient Encounter Medications as of 01/24/2019  Medication Sig  . acyclovir (ZOVIRAX) 400 MG tablet Take 400 mg by mouth daily.  Marland Kitchen albuterol (PROVENTIL HFA;VENTOLIN HFA) 108 (90 Base) MCG/ACT inhaler Inhale 2 puffs into the lungs every 6 (six) hours as needed for wheezing or shortness of breath.  Marland Kitchen amLODipine (NORVASC) 10 MG tablet Take 10 mg by mouth every morning.  Marland Kitchen aspirin EC 81 MG tablet Take 1 tablet (81 mg total) by mouth at bedtime.  . betamethasone dipropionate (DIPROLENE) 0.05 % cream Apply 1 application topically 2 (two)  times daily as needed (atrophic vaginitis).   . Cholecalciferol (VITAMIN D3) 50 MCG (2000 UT) TABS Take 2,000 Units by mouth daily.  . citalopram (CELEXA) 20 MG tablet Take 20 mg by mouth every morning.   . clopidogrel (PLAVIX) 75 MG tablet Take 1 tablet (75 mg total) by mouth every morning.  . dicyclomine (BENTYL) 10 MG capsule TAKE 1 CAPSULE (10 MG TOTAL) BY MOUTH 2 (TWO) TIMES DAILY BEFORE A MEAL.  . isosorbide mononitrate (IMDUR) 60 MG 24 hr tablet TAKE 1.5 TABLETS (90 MG TOTAL) BY MOUTH 2 (TWO) TIMES DAILY.  Marland Kitchen levothyroxine (SYNTHROID, LEVOTHROID) 88 MCG tablet Take 88 mcg by mouth every morning.   . Melatonin 5 MG SUBL Take 5 mg by mouth at bedtime.   . nitroGLYCERIN (NITROSTAT) 0.4 MG SL tablet Place 1 tablet (0.4 mg total) under the tongue every 5 (five) minutes x 3 doses as needed for chest pain (if no relief after 3rd dose, proceed to the ED for an evaluation).  . polyethylene glycol (MIRALAX / GLYCOLAX) packet 17grams in 16 oz of water twice a day until bowel movement.  LAXITIVE.  Restart if two days since last bowel movement (Patient taking differently: Take 17 g by mouth daily as needed for mild constipation. 17grams in 16 oz of water twice a day until bowel movement.  LAXITIVE.  Restart if two days since last bowel movement)  . Wheat Dextrin (  BENEFIBER DRINK MIX) PACK Take 4 g by mouth at bedtime.  . Magnesium Oxide (MAG-OXIDE PO) Take 500 mg by mouth.  Marland Kitchen omeprazole (PRILOSEC) 20 MG capsule Take 20 mg by mouth daily as needed (acid reflux/indigestion.).   Marland Kitchen Potassium 99 MG TABS Take by mouth.  . rosuvastatin (CRESTOR) 20 MG tablet Take 1 tablet (20 mg total) by mouth daily.  . vitamin B-12 (CYANOCOBALAMIN) 1000 MCG tablet Take 1,000 mcg by mouth daily.   No facility-administered encounter medications on file as of 01/24/2019.      Objective: Blood pressure (!) 150/70, pulse (!) 59, temperature (!) 97.5 F (36.4 C), temperature source Oral, height 5\' 2"  (1.575 m), weight 182 lb  (82.6 kg). Patient is alert and in no acute distress. Conjunctiva is pink. Sclera is nonicteric Oropharyngeal mucosa is normal. No neck masses or thyromegaly noted. Cardiac exam with regular rhythm normal S1 and S2.  Faint systolic murmur noted at aortic area. Lungs are clear to auscultation. Abdomen is symmetrical.  Bowel sounds are normal.  Abdomen is soft.  She has mild tenderness in right lower quadrant.  No organomegaly or masses. No LE edema or clubbing noted.  Assessment:  #1.  Irritable bowel syndrome.  Diarrhea and urgency has improved a great deal.  She is still having abdominal pain but it is occurring less frequently and not as intense.  She is not having any side effects with dicyclomine.  #2.  Weight loss.  She has lost 10 pounds in 1 year.  Weight loss appears to be involuntary and most likely due to diminished oral intake.  Will monitor.   Plan:  Take dicyclomine 10 mg before breakfast and second dose on as-needed basis. Weight check in 8 weeks. Office visit in 1 year or earlier if necessary.

## 2019-01-29 ENCOUNTER — Other Ambulatory Visit: Payer: Self-pay | Admitting: Cardiovascular Disease

## 2019-03-13 DIAGNOSIS — Z85820 Personal history of malignant melanoma of skin: Secondary | ICD-10-CM | POA: Diagnosis not present

## 2019-03-13 DIAGNOSIS — D485 Neoplasm of uncertain behavior of skin: Secondary | ICD-10-CM | POA: Diagnosis not present

## 2019-03-17 ENCOUNTER — Other Ambulatory Visit (INDEPENDENT_AMBULATORY_CARE_PROVIDER_SITE_OTHER): Payer: Self-pay | Admitting: Internal Medicine

## 2019-04-12 DIAGNOSIS — N183 Chronic kidney disease, stage 3 unspecified: Secondary | ICD-10-CM | POA: Diagnosis not present

## 2019-04-12 DIAGNOSIS — E039 Hypothyroidism, unspecified: Secondary | ICD-10-CM | POA: Diagnosis not present

## 2019-04-12 DIAGNOSIS — E876 Hypokalemia: Secondary | ICD-10-CM | POA: Diagnosis not present

## 2019-04-12 DIAGNOSIS — R42 Dizziness and giddiness: Secondary | ICD-10-CM | POA: Diagnosis not present

## 2019-04-12 DIAGNOSIS — I10 Essential (primary) hypertension: Secondary | ICD-10-CM | POA: Diagnosis not present

## 2019-04-12 DIAGNOSIS — R5383 Other fatigue: Secondary | ICD-10-CM | POA: Diagnosis not present

## 2019-04-12 DIAGNOSIS — K219 Gastro-esophageal reflux disease without esophagitis: Secondary | ICD-10-CM | POA: Diagnosis not present

## 2019-04-18 DIAGNOSIS — I200 Unstable angina: Secondary | ICD-10-CM | POA: Diagnosis not present

## 2019-04-18 DIAGNOSIS — R252 Cramp and spasm: Secondary | ICD-10-CM | POA: Diagnosis not present

## 2019-04-18 DIAGNOSIS — H9190 Unspecified hearing loss, unspecified ear: Secondary | ICD-10-CM | POA: Diagnosis not present

## 2019-04-18 DIAGNOSIS — R233 Spontaneous ecchymoses: Secondary | ICD-10-CM | POA: Diagnosis not present

## 2019-04-18 DIAGNOSIS — Z6834 Body mass index (BMI) 34.0-34.9, adult: Secondary | ICD-10-CM | POA: Diagnosis not present

## 2019-04-18 DIAGNOSIS — I10 Essential (primary) hypertension: Secondary | ICD-10-CM | POA: Diagnosis not present

## 2019-04-18 DIAGNOSIS — K589 Irritable bowel syndrome without diarrhea: Secondary | ICD-10-CM | POA: Diagnosis not present

## 2019-04-21 ENCOUNTER — Telehealth: Payer: Self-pay | Admitting: *Deleted

## 2019-04-21 NOTE — Telephone Encounter (Signed)
Patient verbally consented for tele-health visits with [facility] and understands that her insurance company will be billed for the encounter.  Aware to have vitals available   

## 2019-04-26 ENCOUNTER — Telehealth (INDEPENDENT_AMBULATORY_CARE_PROVIDER_SITE_OTHER): Payer: Medicare Other | Admitting: Cardiovascular Disease

## 2019-04-26 ENCOUNTER — Encounter: Payer: Self-pay | Admitting: Cardiovascular Disease

## 2019-04-26 VITALS — BP 135/62 | HR 54 | Ht 62.0 in | Wt 178.0 lb

## 2019-04-26 DIAGNOSIS — I25118 Atherosclerotic heart disease of native coronary artery with other forms of angina pectoris: Secondary | ICD-10-CM | POA: Diagnosis not present

## 2019-04-26 DIAGNOSIS — R0609 Other forms of dyspnea: Secondary | ICD-10-CM

## 2019-04-26 DIAGNOSIS — I10 Essential (primary) hypertension: Secondary | ICD-10-CM

## 2019-04-26 DIAGNOSIS — E785 Hyperlipidemia, unspecified: Secondary | ICD-10-CM

## 2019-04-26 DIAGNOSIS — G4733 Obstructive sleep apnea (adult) (pediatric): Secondary | ICD-10-CM

## 2019-04-26 DIAGNOSIS — Z8673 Personal history of transient ischemic attack (TIA), and cerebral infarction without residual deficits: Secondary | ICD-10-CM

## 2019-04-26 DIAGNOSIS — R5383 Other fatigue: Secondary | ICD-10-CM

## 2019-04-26 NOTE — Patient Instructions (Addendum)
Medication Instructions:   Your physician recommends that you continue on your current medications as directed. Please refer to the Current Medication list given to you today.  Labwork:  NONE  Testing/Procedures:  NONE  Follow-Up:  Your physician recommends that you schedule a follow-up appointment in: 6 months (virtual). You will receive a reminder letter in the mail in about 4 months reminding you to call and schedule your appointment. If you don't receive this letter, please contact our office.  Any Other Special Instructions Will Be Listed Below (If Applicable).  If you need a refill on your cardiac medications before your next appointment, please call your pharmacy. 

## 2019-04-26 NOTE — Progress Notes (Signed)
Virtual Visit via Telephone Note   This visit type was conducted due to national recommendations for restrictions regarding the COVID-19 Pandemic (e.g. social distancing) in an effort to limit this patient's exposure and mitigate transmission in our community.  Due to her co-morbid illnesses, this patient is at least at moderate risk for complications without adequate follow up.  This format is felt to be most appropriate for this patient at this time.  The patient did not have access to video technology/had technical difficulties with video requiring transitioning to audio format only (telephone).  All issues noted in this document were discussed and addressed.  No physical exam could be performed with this format.  Please refer to the patient's chart for her  consent to telehealth for [facility].   Date:  04/26/2019   ID:  [name], [name] [birth year], MRN [id number]  Patient Location: Home Provider Location: Office  PCP:  Manon Hilding, MD  Cardiologist:  Kate Sable, MD  Electrophysiologist:  None   Evaluation Performed:  Follow-Up Visit  Chief Complaint:  CAD  History of Present Illness:    Annette Sharp is a 79 y.o. female with CAD. She underwent cardiac catheterization in January 2020 and is deemed to have INOCA (ischemia with no obstructive coronary artery disease). Aggressive medical therapy was recommended.  She has a history of sleep apnea but has been unable to tolerate any masks.  She's been feeling weak and has occasional chest pressure and fullness with "a sticky pain behind the left breast".  Symptoms began slightly over a week ago. She has felt very fatigued.  Creatinine has improved from 1.42 in May to 1.15 about 2 weeks ago.   Past Medical History:  Diagnosis Date  . Anxiety   . Back pain   . Cerebrovascular disease    NONOBSTRUCTIVE CVA BY CAROTID DOPPLERS OCTOBER 2008  . Chronic renal insufficiency    GFR 45 ML'S PER MINUTE  . COPD (chronic  obstructive pulmonary disease) ([address])   . Coronary artery disease    a. s/p DES to RCA in 2007 b. patent stent by cath in 12/2014 with nonobstructive disease along LAD and RCA  . Coronary atherosclerosis of native coronary artery    NEGATIVE LEXISCAN FOR ISCHEMIA MARCH 23,2010 NORMAL LV FUNCTTION  . Depression   . Dyspnea    with exertion  . GERD (gastroesophageal reflux disease)   . Headache    chronic headache from fall in December  . Hyperlipidemia   . Hypertension   . Hypothyroidism   . Other and unspecified angina pectoris    ANGINA  . Overweight(278.02)    OBESITY  . Peptic ulcer disease   . Primary localized osteoarthritis of left knee   . Sleep apnea    SEVERE OBSTRUCTIVE    cpap  . Stroke [facility])    affected memory  . Thyroid disease    TREATED HYPOTHYROIDISM   Past Surgical History:  Procedure Laterality Date  . ABDOMINAL HYSTERECTOMY    . CARDIAC CATHETERIZATION N/A 01/22/2015   Procedure: Right/Left Heart Cath and Coronary Angiography;  Surgeon: Wellington Hampshire, MD;  Location: [place] [facility];  Service: Cardiovascular;  Laterality: N/A;  . CARPAL TUNNEL RELEASE    . CHOLECYSTECTOMY    . COLONOSCOPY N/A 03/31/2018   Procedure: COLONOSCOPY;  Surgeon: Rogene Houston, MD;  [facility];  Service: Endoscopy;  Laterality: N/A;  2:45  . JOINT REPLACEMENT    . LEFT HEART CATH  AND CORONARY ANGIOGRAPHY N/A 04/28/2018   Procedure: LEFT HEART CATH AND CORONARY ANGIOGRAPHY;  Surgeon: Belva Crome, MD;  Location: [place] [facility];  Service: Cardiovascular;  Laterality: N/A;  . POLYPECTOMY  03/31/2018   Procedure: POLYPECTOMY;  Surgeon: Rogene Houston, MD;  [facility];  Service: Endoscopy;;  splen. flex, cecum  . 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 ROTATOR CUFF REPAIR Right 07/08/2009  . TOTAL ABDOMINAL HYSTERECTOMY W/ BILATERAL SALPINGOOPHORECTOMY    . TOTAL KNEE ARTHROPLASTY Left 04/08/2015   Procedure: LEFT TOTAL KNEE ARTHROPLASTY;  Surgeon:  Elsie Saas, MD;  Location: [place];  Service: Orthopedics;  Laterality: Left;  . TOTAL SHOULDER ARTHROPLASTY Left 06/02/2017   Procedure: TOTAL SHOULDER ARTHROPLASTY;  Surgeon: Hiram Gash, MD;  Location: [place];  Service: Orthopedics;  Laterality: Left;     Current Meds  Medication Sig  . acyclovir (ZOVIRAX) 400 MG tablet Take 400 mg by mouth daily.  Marland Kitchen albuterol (PROVENTIL HFA;VENTOLIN HFA) 108 (90 Base) MCG/ACT inhaler Inhale 2 puffs into the lungs every 6 (six) hours as needed for wheezing or shortness of breath.  Marland Kitchen amLODipine (NORVASC) 10 MG tablet Take 10 mg by mouth every morning.  Marland Kitchen aspirin EC 81 MG tablet Take 1 tablet (81 mg total) by mouth at bedtime.  . Calcium-Magnesium-Zinc 333-133-5 MG TABS Take 1 tablet by mouth daily.  . Cholecalciferol (VITAMIN D3) 50 MCG (2000 UT) TABS Take 2,000 Units by mouth daily.  . citalopram (CELEXA) 20 MG tablet Take 20 mg by mouth every morning.   . clopidogrel (PLAVIX) 75 MG tablet Take 1 tablet (75 mg total) by mouth every morning.  . dicyclomine (BENTYL) 10 MG capsule Take 10 mg by mouth daily as needed for spasms.  . isosorbide mononitrate (IMDUR) 60 MG 24 hr tablet TAKE 1.5 TABLETS (90 MG TOTAL) BY MOUTH 2 (TWO) TIMES DAILY.  Marland Kitchen levothyroxine (SYNTHROID, LEVOTHROID) 88 MCG tablet Take 88 mcg by mouth every morning.   . Melatonin 5 MG SUBL Take 5 mg by mouth at bedtime.   . nitroGLYCERIN (NITROSTAT) 0.4 MG SL tablet Place 1 tablet (0.4 mg total) under the tongue every 5 (five) minutes x 3 doses as needed for chest pain (if no relief after 3rd dose, proceed to the ED for an evaluation).  . polyethylene glycol (MIRALAX / GLYCOLAX) packet 17grams in 16 oz of water twice a day until bowel movement.  LAXITIVE.  Restart if two days since last bowel movement (Patient taking differently: Take 17 g by mouth daily as needed for mild constipation. 17grams in 16 oz of water twice a day until bowel movement.  LAXITIVE.  Restart if two days since last bowel  movement)  . rosuvastatin (CRESTOR) 20 MG tablet Take 1 tablet (20 mg total) by mouth daily.  . vitamin B-12 (CYANOCOBALAMIN) 1000 MCG tablet Take 1,000 mcg by mouth daily.  . Wheat Dextrin (BENEFIBER) CHEW Chew 1 tablet by mouth daily.  . [DISCONTINUED] dicyclomine (BENTYL) 10 MG capsule TAKE 1 CAPSULE (10 MG TOTAL) BY MOUTH 2 (TWO) TIMES DAILY BEFORE A MEAL. (Patient taking differently: Take 10 mg by mouth daily as needed. )     Allergies:   Ace inhibitors, Gemfibrozil, Moxifloxacin, and Ranexa [ranolazine]   Social History   Tobacco Use  . Smoking status: Never Smoker  . Smokeless tobacco: Never Used  Substance Use Topics  . Alcohol use: No    Alcohol/week: 0.0 standard drinks  . Drug use: No  Family Hx: The patient's family history includes CAD in her father, mother, sister, and another family member; Cancer in her mother and another family member; Diabetes in her sister; Heart attack in an other family member; Hypertension in an other family member; Leukemia in her brother.  ROS:   Please see the history of present illness.     All other systems reviewed and are negative.   Prior CV studies:   The following studies were reviewed today:  Cardiac catheterization 04/28/2018:   Moderate three-vessel coronary disease, with diffuse proximal to mid calcification in all territories.  Left main 25% diffuse narrowing  Proximal to mid LAD 40 to 50%. Unchanged from prior angiogram  Proximal 30% circumflex. First OM is small with irregularities.  RCA contains a proximal to ostial stent that has 30% proximal narrowing. Luminal irregularities are noted throughout the mid right coronary. No significant obstruction is seen.  Normal left ventricular function with EF greater than 50%. LVEDP is normal.  Labs/Other Tests and Data Reviewed:    EKG:  No ECG reviewed.  Recent Labs: No results found for requested labs within last 8760 hours.   Recent Lipid Panel Lab Results   Component Value Date/Time   CHOL 157 09/09/2018 09:41 AM   TRIG 60 09/09/2018 09:41 AM   HDL 61 09/09/2018 09:41 AM   CHOLHDL 2.6 09/09/2018 09:41 AM   LDLCALC 82 09/09/2018 09:41 AM    Wt Readings from Last 3 Encounters:  04/26/19 178 lb (80.7 kg)  01/24/19 182 lb (82.6 kg)  12/06/18 183 lb (83 kg)     Objective:    Vital Signs:  BP 135/62   Pulse (!) 54   Ht 5\' 2"  (1.575 m)   Wt 178 lb (80.7 kg)   BMI 32.56 kg/m    VITAL SIGNS:  reviewed  ASSESSMENT & PLAN:    1. Coronary artery disease: She is deemed to have INOCA (ischemia with no obstructive coronary artery disease).RCA has 30% proximal to ostial stent stenosis. Aggressive medical therapy was recommended. Cardiac catheterization results reviewed above.Currently on aspirin, amlodipine, Plavix, Imdur 90 mg, and rosuvastatin.  2. Hypertension: Blood pressure isnormal. No changes to therapy.  3. Hyperlipidemia:Lipids reviewed above.  Continue rosuvastatin 20 mg daily.  4. History of CVA: She is on Plavixand statin.  5.  Sleep apnea/fatigue: Has an intolerance for CPAP.  6.  Dyspnea on exertion: She is on optimal medical management for heart disease.  I suggested she speak with her PCP about potentially seeing a pulmonologist to undergo pulmonary function testing.    COVID-19 Education: The signs and symptoms of COVID-19 were discussed with the patient and how to seek care for testing (follow up with PCP or arrange E-visit).  The importance of social distancing was discussed today.  Time:   Today, I have spent 10 minutes with the patient with telehealth technology discussing the above problems.     Medication Adjustments/Labs and Tests Ordered: Current medicines are reviewed at length with the patient today.  Concerns regarding medicines are outlined above.   Tests Ordered: No orders of the defined types were placed in this encounter.   Medication Changes: No orders of the defined types  were placed in this encounter.   Follow Up:  Virtual Visit  in 6 month(s)  Signed, Kate Sable, MD  04/26/2019 10:32 AM    [place]

## 2019-06-09 ENCOUNTER — Ambulatory Visit: Payer: Medicare Other | Admitting: Cardiovascular Disease

## 2019-06-28 DIAGNOSIS — Z85828 Personal history of other malignant neoplasm of skin: Secondary | ICD-10-CM | POA: Diagnosis not present

## 2019-06-28 DIAGNOSIS — L2082 Flexural eczema: Secondary | ICD-10-CM | POA: Diagnosis not present

## 2019-06-28 DIAGNOSIS — L570 Actinic keratosis: Secondary | ICD-10-CM | POA: Diagnosis not present

## 2019-06-28 DIAGNOSIS — L280 Lichen simplex chronicus: Secondary | ICD-10-CM | POA: Diagnosis not present

## 2019-07-18 ENCOUNTER — Telehealth (INDEPENDENT_AMBULATORY_CARE_PROVIDER_SITE_OTHER): Payer: Self-pay | Admitting: Internal Medicine

## 2019-07-18 NOTE — Telephone Encounter (Signed)
Patient left message stating she has had some chest pain and it radiates around to her back - states she spoke to her cardiologist and they seem to think it's GI related - patient wants to know if there is a procedure that could be done to find out what's going on - please advise - ph# [phone]

## 2019-07-19 NOTE — Telephone Encounter (Signed)
Per Dr.Rehman the patient is to take the Omeprazole  20 mg by mouth twice a day. Office visit in 2 weeks. Patient was made aware, she states that she has not been on theis medication in a while. Rx called to [facility] .

## 2019-08-08 DIAGNOSIS — K219 Gastro-esophageal reflux disease without esophagitis: Secondary | ICD-10-CM | POA: Diagnosis not present

## 2019-08-08 DIAGNOSIS — E7800 Pure hypercholesterolemia, unspecified: Secondary | ICD-10-CM | POA: Diagnosis not present

## 2019-08-08 DIAGNOSIS — N183 Chronic kidney disease, stage 3 unspecified: Secondary | ICD-10-CM | POA: Diagnosis not present

## 2019-08-08 DIAGNOSIS — J449 Chronic obstructive pulmonary disease, unspecified: Secondary | ICD-10-CM | POA: Diagnosis not present

## 2019-08-08 DIAGNOSIS — E039 Hypothyroidism, unspecified: Secondary | ICD-10-CM | POA: Diagnosis not present

## 2019-08-08 DIAGNOSIS — I10 Essential (primary) hypertension: Secondary | ICD-10-CM | POA: Diagnosis not present

## 2019-08-08 DIAGNOSIS — R5383 Other fatigue: Secondary | ICD-10-CM | POA: Diagnosis not present

## 2019-08-14 DIAGNOSIS — E039 Hypothyroidism, unspecified: Secondary | ICD-10-CM | POA: Diagnosis not present

## 2019-08-14 DIAGNOSIS — Z6833 Body mass index (BMI) 33.0-33.9, adult: Secondary | ICD-10-CM | POA: Diagnosis not present

## 2019-08-14 DIAGNOSIS — Z0001 Encounter for general adult medical examination with abnormal findings: Secondary | ICD-10-CM | POA: Diagnosis not present

## 2019-08-14 DIAGNOSIS — Z23 Encounter for immunization: Secondary | ICD-10-CM | POA: Diagnosis not present

## 2019-08-14 DIAGNOSIS — I10 Essential (primary) hypertension: Secondary | ICD-10-CM | POA: Diagnosis not present

## 2019-08-14 DIAGNOSIS — R42 Dizziness and giddiness: Secondary | ICD-10-CM | POA: Diagnosis not present

## 2019-08-14 DIAGNOSIS — R252 Cramp and spasm: Secondary | ICD-10-CM | POA: Diagnosis not present

## 2019-08-21 DIAGNOSIS — H04123 Dry eye syndrome of bilateral lacrimal glands: Secondary | ICD-10-CM | POA: Diagnosis not present

## 2019-10-27 DIAGNOSIS — Z6834 Body mass index (BMI) 34.0-34.9, adult: Secondary | ICD-10-CM | POA: Diagnosis not present

## 2019-10-27 DIAGNOSIS — M26601 Right temporomandibular joint disorder, unspecified: Secondary | ICD-10-CM | POA: Diagnosis not present

## 2019-10-27 DIAGNOSIS — H6091 Unspecified otitis externa, right ear: Secondary | ICD-10-CM | POA: Diagnosis not present

## 2019-11-07 ENCOUNTER — Ambulatory Visit: Payer: Medicare Other | Admitting: Cardiovascular Disease

## 2019-11-07 ENCOUNTER — Ambulatory Visit (INDEPENDENT_AMBULATORY_CARE_PROVIDER_SITE_OTHER): Payer: Medicare Other | Admitting: Family Medicine

## 2019-11-07 ENCOUNTER — Encounter: Payer: Self-pay | Admitting: Family Medicine

## 2019-11-07 VITALS — BP 140/64 | HR 62 | Ht 62.0 in | Wt 178.0 lb

## 2019-11-07 DIAGNOSIS — R0600 Dyspnea, unspecified: Secondary | ICD-10-CM | POA: Diagnosis not present

## 2019-11-07 DIAGNOSIS — E782 Mixed hyperlipidemia: Secondary | ICD-10-CM | POA: Diagnosis not present

## 2019-11-07 DIAGNOSIS — G4733 Obstructive sleep apnea (adult) (pediatric): Secondary | ICD-10-CM | POA: Diagnosis not present

## 2019-11-07 DIAGNOSIS — Z8673 Personal history of transient ischemic attack (TIA), and cerebral infarction without residual deficits: Secondary | ICD-10-CM

## 2019-11-07 DIAGNOSIS — I2510 Atherosclerotic heart disease of native coronary artery without angina pectoris: Secondary | ICD-10-CM | POA: Diagnosis not present

## 2019-11-07 DIAGNOSIS — R0609 Other forms of dyspnea: Secondary | ICD-10-CM

## 2019-11-07 DIAGNOSIS — I10 Essential (primary) hypertension: Secondary | ICD-10-CM | POA: Diagnosis not present

## 2019-11-07 MED ORDER — HYDRALAZINE HCL 25 MG PO TABS: 25.0000 mg | ORAL_TABLET | Freq: Two times a day (BID) | ORAL | 1 refills | Status: DC

## 2019-11-07 NOTE — Progress Notes (Signed)
Cardiology Office Note  Date: 11/07/2019   ID: Twinkle, Sockwell 1941-01-16, MRN [id number]  PCP:  Manon Hilding, MD  Cardiologist:  No primary care provider on file. Electrophysiologist:  None   Chief Complaint: CAD  History of Present Illness: Annette Sharp is a 79 y.o. female with a history of CAD, HLD, HTN, CVA, OSA (unable to tolerate CPAP), DOE. Cardiac catheterization January 2020 and deemed to have this to me with no obstructive coronary artery disease.  Aggressive medical therapy was recommended.  Last visit with Dr. Bronson Ing via telemedicine April 26, 2019.  At that time she had been feeling weak and had occasional chest pressure and fullness with sticky pain behind the left breast.  The symptoms began slightly over a week prior.  She was feeling very fatigued.  Her creatinine had improved to 1.42 in May to 1.15 two weeks prior to visit.  At that time she was currently on aspirin, amlodipine, Plavix, Imdur 90 mg and Crestor.  Blood pressure was normal.  On Plavix and statin for history of CVA, intolerant of CPAP for her sleep apnea.  It was suggested she speak with her PCP about seeing a pulmonologist at last visit to undergo pulmonary function testing.  She presents today with no particular complaints denies any recent acute illnesses or hospitalizations.  Denies any progressive anginal or exertional symptoms, palpitations or arrhythmias, orthostatic symptoms/lightheadedness, dizziness, presyncope or syncopal episodes.  Denies any PND or orthopnea, CVA or TIA-like symptoms.  No lower extremity edema noted.  States the blood pressure has been an issue for quite some time.  Blood pressure today on arrival 140/64.  Patient states her blood pressure historically has been running at least as high systolic as today's blood pressure and sometimes even higher.  She is currently taking amlodipine 10 mg daily.  She had been previously on beta-blocker but was stopped due to bradycardia.   Patient states since her primary cardiologist is here no longer she would like to establish with Dr. Harl Bowie.  Her husband is a patient of Dr. Harl Bowie.   Past Medical History:  Diagnosis Date  . Anxiety   . Back pain   . Cerebrovascular disease    NONOBSTRUCTIVE CVA BY CAROTID DOPPLERS OCTOBER 2008  . Chronic renal insufficiency    GFR 45 ML'S PER MINUTE  . COPD (chronic obstructive pulmonary disease) ([place])   . Coronary artery disease    a. s/p DES to RCA in 2007 b. patent stent by cath in 12/2014 with nonobstructive disease along LAD and RCA  . Coronary atherosclerosis of native coronary artery    NEGATIVE LEXISCAN FOR ISCHEMIA MARCH 23,2010 NORMAL LV FUNCTTION  . Depression   . Dyspnea    with exertion  . GERD (gastroesophageal reflux disease)   . Headache    chronic headache from fall in December  . Hyperlipidemia   . Hypertension   . Hypothyroidism   . Other and unspecified angina pectoris    ANGINA  . Overweight(278.02)    OBESITY  . Peptic ulcer disease   . Primary localized osteoarthritis of left knee   . Sleep apnea    SEVERE OBSTRUCTIVE    cpap  . Stroke [facility])    affected memory  . Thyroid disease    TREATED HYPOTHYROIDISM    Past Surgical History:  Procedure Laterality Date  . ABDOMINAL HYSTERECTOMY    . CARDIAC CATHETERIZATION N/A 01/22/2015   Procedure: Right/Left Heart Cath and Coronary Angiography;  Surgeon:  Wellington Hampshire, MD;  Location: [place] [facility];  Service: Cardiovascular;  Laterality: N/A;  . CARPAL TUNNEL RELEASE    . CHOLECYSTECTOMY    . COLONOSCOPY N/A 03/31/2018   Procedure: COLONOSCOPY;  Surgeon: Rogene Houston, MD;  [facility];  Service: Endoscopy;  Laterality: N/A;  2:45  . JOINT REPLACEMENT    . LEFT HEART CATH AND CORONARY ANGIOGRAPHY N/A 04/28/2018   Procedure: LEFT HEART CATH AND CORONARY ANGIOGRAPHY;  Surgeon: Belva Crome, MD;  Location: [place] [facility];  Service: Cardiovascular;  Laterality: N/A;  .  POLYPECTOMY  03/31/2018   Procedure: POLYPECTOMY;  Surgeon: Rogene Houston, MD;  [facility];  Service: Endoscopy;;  splen. flex, cecum  . 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 ROTATOR CUFF REPAIR Right 07/08/2009  . TOTAL ABDOMINAL HYSTERECTOMY W/ BILATERAL SALPINGOOPHORECTOMY    . TOTAL KNEE ARTHROPLASTY Left 04/08/2015   Procedure: LEFT TOTAL KNEE ARTHROPLASTY;  Surgeon: Elsie Saas, MD;  Location: [place];  Service: Orthopedics;  Laterality: Left;  . TOTAL SHOULDER ARTHROPLASTY Left 06/02/2017   Procedure: TOTAL SHOULDER ARTHROPLASTY;  Surgeon: Hiram Gash, MD;  Location: [place];  Service: Orthopedics;  Laterality: Left;    Current Outpatient Medications  Medication Sig Dispense Refill  . acyclovir (ZOVIRAX) 400 MG tablet Take 400 mg by mouth daily.    Marland Kitchen albuterol (PROVENTIL HFA;VENTOLIN HFA) 108 (90 Base) MCG/ACT inhaler Inhale 2 puffs into the lungs every 6 (six) hours as needed for wheezing or shortness of breath. 1 Inhaler 2  . amLODipine (NORVASC) 10 MG tablet Take 10 mg by mouth every morning.  3  . aspirin EC 81 MG tablet Take 1 tablet (81 mg total) by mouth at bedtime.    . betamethasone dipropionate (DIPROLENE) 0.05 % cream Apply 1 application topically 2 (two) times daily as needed (atrophic vaginitis).     . Calcium-Magnesium-Zinc 333-133-5 MG TABS Take 1 tablet by mouth daily.    . Cholecalciferol (VITAMIN D3) 50 MCG (2000 UT) TABS Take 2,000 Units by mouth daily.    . citalopram (CELEXA) 20 MG tablet Take 20 mg by mouth every morning.     . clopidogrel (PLAVIX) 75 MG tablet Take 1 tablet (75 mg total) by mouth every morning.    . dicyclomine (BENTYL) 10 MG capsule Take 10 mg by mouth daily.     . isosorbide mononitrate (IMDUR) 60 MG 24 hr tablet TAKE 1.5 TABLETS (90 MG TOTAL) BY MOUTH 2 (TWO) TIMES DAILY. 270 tablet 2  . levothyroxine (SYNTHROID, LEVOTHROID) 88 MCG tablet Take 88 mcg by mouth every morning.     . Melatonin 5 MG SUBL Take 5 mg by mouth at bedtime.      . polyethylene glycol (MIRALAX / GLYCOLAX) packet 17grams in 16 oz of water twice a day until bowel movement.  LAXITIVE.  Restart if two days since last bowel movement (Patient taking differently: Take 17 g by mouth daily as needed for mild constipation. 17grams in 16 oz of water twice a day until bowel movement.  LAXITIVE.  Restart if two days since last bowel movement) 60 each 0  . rosuvastatin (CRESTOR) 20 MG tablet Take 1 tablet (20 mg total) by mouth daily. 90 tablet 3  . vitamin B-12 (CYANOCOBALAMIN) 1000 MCG tablet Take 1,000 mcg by mouth daily.    . Wheat Dextrin (BENEFIBER) CHEW Chew 1 tablet by mouth daily.    . hydrALAZINE (APRESOLINE) 25 MG tablet Take 1 tablet (25  mg total) by mouth 2 (two) times daily. 180 tablet 1  . nitroGLYCERIN (NITROSTAT) 0.4 MG SL tablet Place 1 tablet (0.4 mg total) under the tongue every 5 (five) minutes x 3 doses as needed for chest pain (if no relief after 3rd dose, proceed to the ED for an evaluation). 25 tablet 3   No current facility-administered medications for this visit.   Allergies:  Ace inhibitors, Gemfibrozil, Moxifloxacin, and Ranexa [ranolazine]   Social History: The patient  reports that she has never smoked. She has never used smokeless tobacco. She reports that she does not drink alcohol and does not use drugs.   Family History: The patient's family history includes CAD in her father, mother, sister, and another family member; Cancer in her mother and another family member; Diabetes in her sister; Heart attack in an other family member; Hypertension in an other family member; Leukemia in her brother.   ROS:  Please see the history of present illness. Otherwise, complete review of systems is positive for none.  All other systems are reviewed and negative.   Physical Exam: VS:  BP 140/64   Pulse 62   Ht 5\' 2"  (1.575 m)   Wt 178 lb (80.7 kg)   SpO2 96%   BMI 32.56 kg/m , BMI Body mass index is 32.56 kg/m.  Wt Readings from Last 3  Encounters:  11/07/19 178 lb (80.7 kg)  04/26/19 178 lb (80.7 kg)  01/24/19 182 lb (82.6 kg)    General: Patient appears comfortable at rest. Neck: Supple, no elevated JVP or carotid bruits, no thyromegaly. Lungs: Clear to auscultation, nonlabored breathing at rest. Cardiac: Regular rate and rhythm, no S3 or significant systolic murmur, no pericardial rub. Extremities: No pitting edema, distal pulses 2+. Skin: Warm and dry. Musculoskeletal: No kyphosis. Neuropsychiatric: Alert and oriented x3, affect grossly appropriate.  ECG:  An ECG dated 11/07/2019 was personally reviewed today and demonstrated:  Normal sinus rhythm rate of 62  Recent Labwork: No results found for requested labs within last 8760 hours.     Component Value Date/Time   CHOL 157 09/09/2018 0941   TRIG 60 09/09/2018 0941   HDL 61 09/09/2018 0941   CHOLHDL 2.6 09/09/2018 0941   VLDL 16 07/20/2017 1150   LDLCALC 82 09/09/2018 0941    Other Studies Reviewed Today:  Left heart cath 04/28/2018 Conclusion   Moderate three-vessel coronary disease, with diffuse proximal to mid calcification in all territories.  Left main 25% diffuse narrowing  Proximal to mid LAD 40 to 50%.  Unchanged from prior angiogram  Proximal 30% circumflex.  First OM is small with irregularities.  RCA contains a proximal to ostial stent that has 30% proximal narrowing.  Luminal irregularities are noted throughout the mid right coronary.  No significant obstruction is seen.  Normal left ventricular function with EF greater than 50%.  LVEDP is normal.  RECOMMENDATIONS:   Symptoms are compatible with angina.  This is likely a situation in the clinical category of INOCA.  Recommend aggressive risk factor modification, consider adding angiotensin system blockade, and aggressive lipid-lowering.  Nitroglycerin use for episodes of chest discomfort Diagnostic Dominance: Right       Assessment and Plan:  1. CAD in native artery     2. Essential hypertension   3. Mixed hyperlipidemia   4. History of CVA (cerebrovascular accident)   5. OSA (obstructive sleep apnea)   6. DOE (dyspnea on exertion)    1. CAD in native artery INOCA (ischemia without obstructive  coronary artery disease) RCA 30% proximal ostial stenosis.  Aggressive medical therapy recommended.  Currently on aspirin, amlodipine Plavix, Imdur, Crestor.  Continue aspirin 81 mg daily.  Plavix 75 mg daily.  Imdur 90 mg daily.  Sublingual nitroglycerin as needed for chest pain.  2. Essential hypertension Blood pressure today 140/64.  Patient states her systolic pressure has been running at least 140 or greater for quite some time.  She states actually her blood pressure has been higher than this in the past.  She is currently on amlodipine 10 mg daily only for blood pressure control.  Please add hydralazine 25 mg p.o. twice daily for better blood pressure control.  3. Mixed hyperlipidemia Lipid panel on 09/09/2018: TC 153, HDL 61, TG 60, LDL 82.  At that time her Crestor was then increased to 20 mg due to LDL not at goal.  Continue Crestor 20 mg daily.  4. History of CVA (cerebrovascular accident) On Plavix and statin.  No CVA or TIA-like symptoms.  5. OSA (obstructive sleep apnea) Not tolerant of CPAP.  6. DOE (dyspnea on exertion) At last visit with Dr. Bronson Ing he suggested she speak to her PCP about potential seeing pulmonologist to undergo pulmonary function testing.  Medication Adjustments/Labs and Tests Ordered: Current medicines are reviewed at length with the patient today.  Concerns regarding medicines are outlined above.   Disposition: Follow-up with Dr. Harl Bowie in 2 months.  Patient wants to establish with Dr. Harl Bowie since her primary cardiologist is no longer here.  Her husband is a patient with Dr. Harl Bowie  Signed, Levell July, NP 11/07/2019 4:39 PM    [facility] [place], [place], [place] [address] Phone:  [phone]; Fax: [phone]

## 2019-11-07 NOTE — Patient Instructions (Addendum)
Medication Instructions:   Your physician has recommended you make the following change in your medication:   Start hydralazine 25 mg by mouth twice daily  Continue other medications the same  Labwork:  NONE  Testing/Procedures:  NONE  Follow-Up:  Your physician recommends that you schedule a follow-up appointment in: 2 months with Dr. Harl Bowie.  Any Other Special Instructions Will Be Listed Below (If Applicable).  If you need a refill on your cardiac medications before your next appointment, please call your pharmacy.

## 2019-11-27 ENCOUNTER — Other Ambulatory Visit: Payer: Self-pay | Admitting: Family Medicine

## 2019-11-27 DIAGNOSIS — Z23 Encounter for immunization: Secondary | ICD-10-CM | POA: Diagnosis not present

## 2019-11-27 MED ORDER — ROSUVASTATIN CALCIUM 20 MG PO TABS: 20.0000 mg | ORAL_TABLET | Freq: Every day | ORAL | 1 refills | Status: DC

## 2019-11-27 NOTE — Telephone Encounter (Signed)
No record of any rx faxed or escribed to our office - Medication sent to pharmacy.

## 2019-11-27 NOTE — Telephone Encounter (Signed)
*  STAT* If patient is at the pharmacy, call can be transferred to refill team.   1. Which medications need to be refilled? (please list name of each medication and dose if known) CRESTOR 20 MG   2. Which pharmacy/location (including street and city if local pharmacy) is medication to be sent to? [facility] [place]   3. Do they need a 30 day or 90 day supply? 90   PATIENT IS OUT OF MEDICATION:  [facility].

## 2019-11-29 ENCOUNTER — Other Ambulatory Visit: Payer: Self-pay | Admitting: *Deleted

## 2019-11-29 MED ORDER — ISOSORBIDE MONONITRATE ER 60 MG PO TB24: 90.0000 mg | ORAL_TABLET | Freq: Two times a day (BID) | ORAL | 3 refills | Status: DC

## 2019-12-13 IMAGING — DX DG SHOULDER 1V*L*
1 series · 1 of 1 positions shown · non-contrast
Comparison: 01/22/2017

CLINICAL DATA: Status post left shoulder arthroplasty.

EXAM:
LEFT SHOULDER - 1 VIEW

[shoulder]
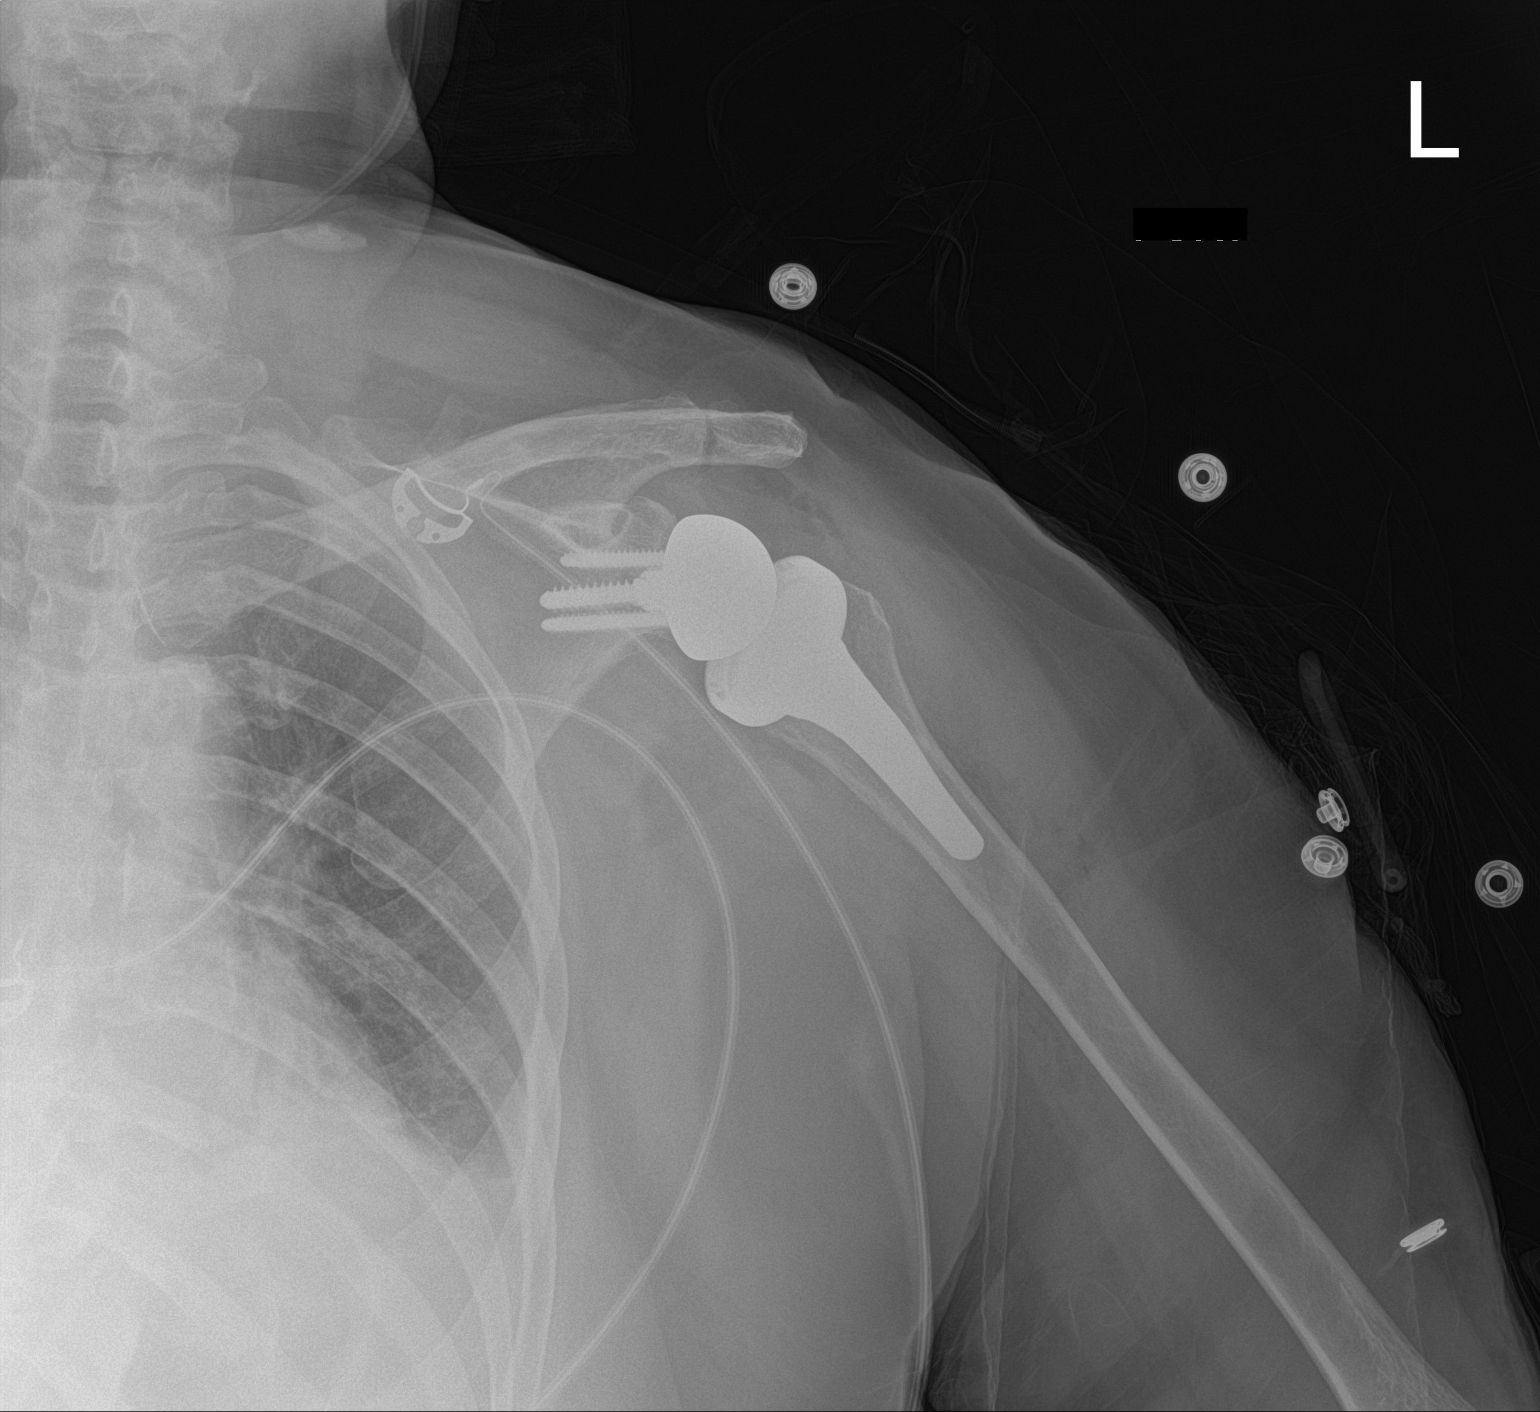

[1 of 1 positions shown; findings below may reference images not displayed]

FINDINGS: Single frontal view of the left shoulder demonstrates interval
shoulder arthroplasty. No hardware complication or periprosthetic
fractures identified. Cardiomegaly and aortic atherosclerosis. Low
lung volumes with probable left base atelectasis..
IMPRESSION: Expected appearance after left shoulder arthroplasty.

## 2019-12-26 DIAGNOSIS — D1801 Hemangioma of skin and subcutaneous tissue: Secondary | ICD-10-CM | POA: Diagnosis not present

## 2019-12-26 DIAGNOSIS — Z85828 Personal history of other malignant neoplasm of skin: Secondary | ICD-10-CM | POA: Diagnosis not present

## 2019-12-26 DIAGNOSIS — Z85820 Personal history of malignant melanoma of skin: Secondary | ICD-10-CM | POA: Diagnosis not present

## 2019-12-26 DIAGNOSIS — L570 Actinic keratosis: Secondary | ICD-10-CM | POA: Diagnosis not present

## 2020-01-16 ENCOUNTER — Other Ambulatory Visit (INDEPENDENT_AMBULATORY_CARE_PROVIDER_SITE_OTHER): Payer: Self-pay | Admitting: Internal Medicine

## 2020-01-23 ENCOUNTER — Ambulatory Visit (INDEPENDENT_AMBULATORY_CARE_PROVIDER_SITE_OTHER): Payer: Medicare Other | Admitting: Internal Medicine

## 2020-01-24 NOTE — Progress Notes (Signed)
Patient profile: Annette Sharp is a 79 y.o. female seen for yearly follow-up.  She was last seen by Dr. Laural Sharp October 2020 for IBS. She was continued on dicyclomine 10mg  1-2x/day.  History of Present Illness: Annette Sharp is seen today for follow up. Reports she is normally taking dicyclomine before evening meal. Often doesn't eat much through out the day and just eats one meal in evening.  Appetite is fair, often gets up around 11am and doesn't get hungry throughout day so may just snack. She does have some abdominal cramping in the evenings, she feels this is a lot worse if she forgets to take the dicyclomine before she eats.  She has not found any clear food triggers but has not kept a food journal.  We discussed her diet and she has salad every night for dinner.  She does also have 2 carbonated sodas throughout the day.   She reports having a bm about QOD. No blood in stool. Hasn't noticed cramping improves w/ defecation. No longer taking miralax, using benefiber now instead. Feels she gets some quite a bit of fluid throughout the day.  No nausea/vomiting. No GERD symptoms on omperazole 20mg  BID. No dysphagia.  Weight has been stable  Wt Readings from Last 3 Encounters:  01/25/20 177 lb 12.8 oz (80.6 kg)  11/07/19 178 lb (80.7 kg)  04/26/19 178 lb (80.7 kg)     Last Colonoscopy: 03/2018-- One 6 mm polyp in the cecum, removed with a cold snare. Resected and retrieved. Clip was placed. - One small polyp in the mid transverse colon, removed with a cold snare. Complete resection. Polyp tissue not retrieved. - Two small polyps at the splenic flexure, removed with a cold snare. Resected and retrieved. - One 7 mm polyp in the distal sigmoid colon, removed with a hot snare. Resected and retrieved. - Diverticulosis in the sigmoid colon. 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 Next colonoscopy in 3  years    Past Medical History:  Past Medical History:  Diagnosis Date  . Anxiety   . Back pain   . Cerebrovascular disease    NONOBSTRUCTIVE CVA BY CAROTID DOPPLERS OCTOBER 2008  . Chronic renal insufficiency    GFR 45 ML'S PER MINUTE  . COPD (chronic obstructive pulmonary disease) ([place])   . Coronary artery disease    a. s/p DES to RCA in 2007 b. patent stent by cath in 12/2014 with nonobstructive disease along LAD and RCA  . Coronary atherosclerosis of native coronary artery    NEGATIVE LEXISCAN FOR ISCHEMIA MARCH 23,2010 NORMAL LV FUNCTTION  . Depression   . Dyspnea    with exertion  . GERD (gastroesophageal reflux disease)   . Headache    chronic headache from fall in December  . Hyperlipidemia   . Hypertension   . Hypothyroidism   . Other and unspecified angina pectoris    ANGINA  . Overweight(278.02)    OBESITY  . Peptic ulcer disease   . Primary localized osteoarthritis of left knee   . Sleep apnea    SEVERE OBSTRUCTIVE    cpap  . Stroke [facility])    affected memory  . Thyroid disease    TREATED HYPOTHYROIDISM    Problem List: Patient Active Problem List   Diagnosis Date Noted  . Loss of weight 01/24/2019  . History of colonic polyps 01/13/2018  . Irritable bowel syndrome with diarrhea 01/13/2018  .  Left rotator cuff tear arthropathy 06/02/2017  . Hypokalemia 05/25/2015  . Dehydration 05/25/2015  . Acute kidney injury ([place]) 05/25/2015  . COPD (chronic obstructive pulmonary disease) ([place]) 05/25/2015  . Paresthesia 05/25/2015  . Lactic acidosis 05/25/2015  . Lymphocytosis 05/25/2015  . DJD (degenerative joint disease) of knee 04/08/2015  . Primary localized osteoarthritis of left knee   . Coronary artery disease involving native coronary artery   . Dyspnea   . Chronic kidney disease 11/12/2011  . Preoperative clearance 06/24/2010  . SHOULDER PAIN 04/15/2009  . Hyperlipidemia 01/05/2009  . Overweight 01/05/2009  . Essential hypertension 01/05/2009  .  Angina decubitus ([place]) 01/05/2009  . CAD, NATIVE VESSEL 01/05/2009    Past Surgical History: Past Surgical History:  Procedure Laterality Date  . ABDOMINAL HYSTERECTOMY    . CARDIAC CATHETERIZATION N/A 01/22/2015   Procedure: Right/Left Heart Cath and Coronary Angiography;  Surgeon: Wellington Hampshire, MD;  Location: [place] CV LAB;  Service: Cardiovascular;  Laterality: N/A;  . CARPAL TUNNEL RELEASE    . CHOLECYSTECTOMY    . COLONOSCOPY N/A 03/31/2018   Procedure: COLONOSCOPY;  Surgeon: Rogene Houston, MD;  [facility];  Service: Endoscopy;  Laterality: N/A;  2:45  . JOINT REPLACEMENT    . LEFT HEART CATH AND CORONARY ANGIOGRAPHY N/A 04/28/2018   Procedure: LEFT HEART CATH AND CORONARY ANGIOGRAPHY;  Surgeon: Belva Crome, MD;  Location: [place] [facility];  Service: Cardiovascular;  Laterality: N/A;  . POLYPECTOMY  03/31/2018   Procedure: POLYPECTOMY;  Surgeon: Rogene Houston, MD;  [facility];  Service: Endoscopy;;  splen. flex, cecum  . 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 ROTATOR CUFF REPAIR Right 07/08/2009  . TOTAL ABDOMINAL HYSTERECTOMY W/ BILATERAL SALPINGOOPHORECTOMY    . TOTAL KNEE ARTHROPLASTY Left 04/08/2015   Procedure: LEFT TOTAL KNEE ARTHROPLASTY;  Surgeon: Elsie Saas, MD;  Location: [place];  Service: Orthopedics;  Laterality: Left;  . TOTAL SHOULDER ARTHROPLASTY Left 06/02/2017   Procedure: TOTAL SHOULDER ARTHROPLASTY;  Surgeon: Hiram Gash, MD;  Location: [place];  Service: Orthopedics;  Laterality: Left;    Allergies: Allergies  Allergen Reactions  . Ace Inhibitors Cough  . Gemfibrozil Other (See Comments)    Muscle Aches  . Moxifloxacin Rash  . Ranexa [Ranolazine] Other (See Comments)    "NIGHTMARES"      Home Medications:  Current Outpatient Medications:  .  acyclovir (ZOVIRAX) 400 MG tablet, Take 400 mg by mouth daily., Disp: , Rfl:  .  albuterol (PROVENTIL HFA;VENTOLIN HFA) 108 (90 Base) MCG/ACT inhaler, Inhale 2 puffs into  the lungs every 6 (six) hours as needed for wheezing or shortness of breath., Disp: 1 Inhaler, Rfl: 2 .  amLODipine (NORVASC) 10 MG tablet, Take 10 mg by mouth every morning., Disp: , Rfl: 3 .  aspirin EC 81 MG tablet, Take 1 tablet (81 mg total) by mouth at bedtime., Disp: , Rfl:  .  betamethasone dipropionate (DIPROLENE) 0.05 % cream, Apply 1 application topically 2 (two) times daily as needed (atrophic vaginitis). , Disp: , Rfl:  .  cetirizine (ZYRTEC) 10 MG tablet, Take 10 mg by mouth daily., Disp: , Rfl:  .  Cholecalciferol (VITAMIN D3) 50 MCG (2000 UT) TABS, Take 2,000 Units by mouth daily., Disp: , Rfl:  .  citalopram (CELEXA) 20 MG tablet, Take 20 mg by mouth every morning. , Disp: , Rfl:  .  clopidogrel (PLAVIX) 75 MG tablet, Take 1 tablet (75 mg total) by mouth every  morning., Disp: , Rfl:  .  hydrALAZINE (APRESOLINE) 25 MG tablet, Take 1 tablet (25 mg total) by mouth 2 (two) times daily., Disp: 180 tablet, Rfl: 1 .  isosorbide mononitrate (IMDUR) 60 MG 24 hr tablet, Take 1.5 tablets (90 mg total) by mouth 2 (two) times daily., Disp: 270 tablet, Rfl: 3 .  levothyroxine (SYNTHROID, LEVOTHROID) 88 MCG tablet, Take 88 mcg by mouth every morning. , Disp: , Rfl:  .  Melatonin 5 MG SUBL, Take 5 mg by mouth at bedtime. , Disp: , Rfl:  .  omeprazole (PRILOSEC) 20 MG capsule, TAKE 1 CAPSULE TWICE A DAY, Disp: 180 capsule, Rfl: 1 .  rosuvastatin (CRESTOR) 20 MG tablet, Take 1 tablet (20 mg total) by mouth daily., Disp: 90 tablet, Rfl: 1 .  vitamin B-12 (CYANOCOBALAMIN) 1000 MCG tablet, Take 1,000 mcg by mouth daily., Disp: , Rfl:  .  Wheat Dextrin (BENEFIBER) CHEW, Chew 1 tablet by mouth daily., Disp: , Rfl:  .  hyoscyamine (LEVBID) 0.375 MG 12 hr tablet, Take 1 tablet (0.375 mg total) by mouth daily., Disp: 30 tablet, Rfl: 11 .  nitroGLYCERIN (NITROSTAT) 0.4 MG SL tablet, Place 1 tablet (0.4 mg total) under the tongue every 5 (five) minutes x 3 doses as needed for chest pain (if no relief after 3rd  dose, proceed to the ED for an evaluation)., Disp: 25 tablet, Rfl: 3 .  polyethylene glycol (MIRALAX / GLYCOLAX) packet, 17grams in 16 oz of water twice a day until bowel movement.  LAXITIVE.  Restart if two days since last bowel movement (Patient not taking: Reported on 01/25/2020), Disp: 58 each, Rfl: 0   Family History: family history includes CAD in her father, mother, sister, and another family member; Cancer in her mother and another family member; Diabetes in her sister; Heart attack in an other family member; Hypertension in an other family member; Leukemia in her brother.    Social History:   reports that she has never smoked. She has never used smokeless tobacco. She reports that she does not drink alcohol and does not use drugs.   Review of Systems: Constitutional: Denies weight loss/weight gain  Eyes: No changes in vision. ENT: No oral lesions, sore throat.  GI: see HPI.  Heme/Lymph: No easy bruising.  CV: No chest pain.  GU: No hematuria.  Integumentary: No rashes.  Neuro: No headaches.  Psych: No depression/anxiety.  Endocrine: No heat/cold intolerance.  Allergic/Immunologic: No urticaria.  Resp: No cough, SOB.  Musculoskeletal: No joint swelling.    Physical Examination: BP 123/70 (BP Location: Right Arm, Patient Position: Sitting, Cuff Size: Large)   Pulse 90   Temp 97.7 F (36.5 C) (Oral)   Ht 5\' 2"  (1.575 m)   Wt 177 lb 12.8 oz (80.6 kg)   BMI 32.52 kg/m  Gen: NAD, alert and oriented x 4 HEENT: PEERLA, EOMI, Neck: supple, no JVD Chest: CTA bilaterally, no wheezes, crackles, or other adventitious sounds CV: RRR, no m/g/c/r Abd: soft, NT, ND, +BS in all four quadrants; no HSM, guarding, ridigity, or rebound tenderness Ext: no edema, well perfused with 2+ pulses, Skin: no rash or lesions noted on observed skin Lymph: no noted LAD  Data Reviewed:  Patient reports Dr. Quintin Alto monitor labs every 70-month   Assessment/Plan: Ms. Horsman is a 79 y.o. female seen  for follow-up.   1.  IBS-she has some abdominal cramping and uses dicyclomine once a day most days, she often forgets dicyclomine and feels abdominal cramping is definitely worsen if  forgets dose. She she is interested in trying something different and will prescribe levbid which may be longer acting if covered by insurance.  We also discussed decreasing her intake of salads and carbonation which may be worsening symptoms.  She will try keeping a food journal.  2.  GERD-asymptomatic on current dose.  Continue PPI diet modifications  Follow-up 1 year if doing well-sooner if needed   Hx colon polyps - due Jan 2023  Edrie was seen today for 1 year follow up.  Diagnoses and all orders for this visit:  Irritable bowel syndrome, unspecified type  Chronic GERD  Personal history of colonic polyps  Other orders -     hyoscyamine (LEVBID) 0.375 MG 12 hr tablet; Take 1 tablet (0.375 mg total) by mouth daily.      I personally performed the service, non-incident to. (WP)  Laurine Blazer, [facility]

## 2020-01-25 ENCOUNTER — Encounter (INDEPENDENT_AMBULATORY_CARE_PROVIDER_SITE_OTHER): Payer: Self-pay | Admitting: Gastroenterology

## 2020-01-25 ENCOUNTER — Other Ambulatory Visit: Payer: Self-pay

## 2020-01-25 ENCOUNTER — Ambulatory Visit (INDEPENDENT_AMBULATORY_CARE_PROVIDER_SITE_OTHER): Payer: Medicare Other | Admitting: Gastroenterology

## 2020-01-25 VITALS — BP 123/70 | HR 90 | Temp 97.7°F | Ht 62.0 in | Wt 177.8 lb

## 2020-01-25 DIAGNOSIS — K219 Gastro-esophageal reflux disease without esophagitis: Secondary | ICD-10-CM

## 2020-01-25 DIAGNOSIS — K589 Irritable bowel syndrome without diarrhea: Secondary | ICD-10-CM | POA: Diagnosis not present

## 2020-01-25 DIAGNOSIS — Z86010 Personal history of colonic polyps: Secondary | ICD-10-CM

## 2020-01-25 DIAGNOSIS — I2510 Atherosclerotic heart disease of native coronary artery without angina pectoris: Secondary | ICD-10-CM

## 2020-01-25 MED ORDER — HYOSCYAMINE SULFATE ER 0.375 MG PO TB12: 0.3750 mg | ORAL_TABLET | Freq: Every day | ORAL | 11 refills | Status: DC

## 2020-01-25 NOTE — Patient Instructions (Signed)
Continue omeprazole 20mg  twice a day for reflux. Try LevBid if covered by insurance instead of dicyclomine. Otherwise continue dicyclomine if not covered but do not take LevBid and dicyclomine together.   The days symptoms are worst keep a food log of what you had to eat that day. Look for reoccurring foods.

## 2020-02-05 ENCOUNTER — Ambulatory Visit (INDEPENDENT_AMBULATORY_CARE_PROVIDER_SITE_OTHER): Payer: Medicare Other | Admitting: Cardiology

## 2020-02-05 ENCOUNTER — Encounter: Payer: Self-pay | Admitting: *Deleted

## 2020-02-05 ENCOUNTER — Telehealth: Payer: Self-pay | Admitting: Cardiology

## 2020-02-05 ENCOUNTER — Encounter: Payer: Self-pay | Admitting: Cardiology

## 2020-02-05 VITALS — BP 122/64 | HR 72 | Ht 62.0 in | Wt 179.2 lb

## 2020-02-05 DIAGNOSIS — R079 Chest pain, unspecified: Secondary | ICD-10-CM

## 2020-02-05 DIAGNOSIS — R0789 Other chest pain: Secondary | ICD-10-CM | POA: Diagnosis not present

## 2020-02-05 DIAGNOSIS — E782 Mixed hyperlipidemia: Secondary | ICD-10-CM | POA: Diagnosis not present

## 2020-02-05 DIAGNOSIS — I2510 Atherosclerotic heart disease of native coronary artery without angina pectoris: Secondary | ICD-10-CM | POA: Diagnosis not present

## 2020-02-05 DIAGNOSIS — I10 Essential (primary) hypertension: Secondary | ICD-10-CM | POA: Diagnosis not present

## 2020-02-05 NOTE — Telephone Encounter (Signed)
Pre-cert Verification for the following procedure    LEXISCAN   DATE: 02/07/2020  LOCATION: [facility]

## 2020-02-05 NOTE — Patient Instructions (Signed)
Your physician recommends that you schedule a follow-up appointment in: 2 MONTHS  Your physician recommends that you continue on your current medications as directed. Please refer to the Current Medication list given to you today.  Your physician has requested that you have a lexiscan myoview. For further information please visit [email or web address]. Please follow instruction sheet, as given.  Thank you for choosing [facility]!!

## 2020-02-05 NOTE — Progress Notes (Signed)
Clinical Summary Ms. Moncrief is a 79 y.o.female seen today for follow up of the following medical problems.   1. CAD - Prior DES to RCA in 2007.  - cath Jan 2020 without significant disease  - some recent chest pain. Pulling like pain in midchest into right shoulder, has had prior shoulder replacements - usually occurs with activity.Not positional. Lasts about 10 minutes. Some SOB, feels dizzy/lightheaded. - highest level of activity is house work, or walking to [employer].  - better with with SL NG   2. HTN - last visit 10/2019 with PA Leonides Sake hydralazine 25mg  bid added - bp's have improved   3. OSA - not able to tolerate cpap - does have some generalized fatigue  4. Hyperlipidemia - 08/2018 TC 157 HDL 61 TG LD 82 - she is crestor 20mg     5. History of prior CVA - has been on plavix.     Past Medical History:  Diagnosis Date  . Anxiety   . Back pain   . Cerebrovascular disease    NONOBSTRUCTIVE CVA BY CAROTID DOPPLERS OCTOBER 2008  . Chronic renal insufficiency    GFR 45 ML'S PER MINUTE  . COPD (chronic obstructive pulmonary disease) ([place])   . Coronary artery disease    a. s/p DES to RCA in 2007 b. patent stent by cath in 12/2014 with nonobstructive disease along LAD and RCA  . Coronary atherosclerosis of native coronary artery    NEGATIVE LEXISCAN FOR ISCHEMIA MARCH 23,2010 NORMAL LV FUNCTTION  . Depression   . Dyspnea    with exertion  . GERD (gastroesophageal reflux disease)   . Headache    chronic headache from fall in December  . Hyperlipidemia   . Hypertension   . Hypothyroidism   . Other and unspecified angina pectoris    ANGINA  . Overweight(278.02)    OBESITY  . Peptic ulcer disease   . Primary localized osteoarthritis of left knee   . Sleep apnea    SEVERE OBSTRUCTIVE    cpap  . Stroke [facility])    affected memory  . Thyroid disease    TREATED HYPOTHYROIDISM     Allergies  Allergen Reactions  . Ace Inhibitors Cough  . Gemfibrozil Other  (See Comments)    Muscle Aches  . Moxifloxacin Rash  . Ranexa [Ranolazine] Other (See Comments)    "NIGHTMARES"     Current Outpatient Medications  Medication Sig Dispense Refill  . acyclovir (ZOVIRAX) 400 MG tablet Take 400 mg by mouth daily.    Marland Kitchen albuterol (PROVENTIL HFA;VENTOLIN HFA) 108 (90 Base) MCG/ACT inhaler Inhale 2 puffs into the lungs every 6 (six) hours as needed for wheezing or shortness of breath. 1 Inhaler 2  . amLODipine (NORVASC) 10 MG tablet Take 10 mg by mouth every morning.  3  . aspirin EC 81 MG tablet Take 1 tablet (81 mg total) by mouth at bedtime.    . betamethasone dipropionate (DIPROLENE) 0.05 % cream Apply 1 application topically 2 (two) times daily as needed (atrophic vaginitis).     . cetirizine (ZYRTEC) 10 MG tablet Take 10 mg by mouth daily.    . Cholecalciferol (VITAMIN D3) 50 MCG (2000 UT) TABS Take 2,000 Units by mouth daily.    . citalopram (CELEXA) 20 MG tablet Take 20 mg by mouth every morning.     . clopidogrel (PLAVIX) 75 MG tablet Take 1 tablet (75 mg total) by mouth every morning.    . hydrALAZINE (APRESOLINE) 25 MG  tablet Take 1 tablet (25 mg total) by mouth 2 (two) times daily. 180 tablet 1  . hyoscyamine (LEVBID) 0.375 MG 12 hr tablet Take 1 tablet (0.375 mg total) by mouth daily. 30 tablet 11  . isosorbide mononitrate (IMDUR) 60 MG 24 hr tablet Take 1.5 tablets (90 mg total) by mouth 2 (two) times daily. 270 tablet 3  . levothyroxine (SYNTHROID, LEVOTHROID) 88 MCG tablet Take 88 mcg by mouth every morning.     . Melatonin 5 MG SUBL Take 5 mg by mouth at bedtime.     . nitroGLYCERIN (NITROSTAT) 0.4 MG SL tablet Place 1 tablet (0.4 mg total) under the tongue every 5 (five) minutes x 3 doses as needed for chest pain (if no relief after 3rd dose, proceed to the ED for an evaluation). 25 tablet 3  . omeprazole (PRILOSEC) 20 MG capsule TAKE 1 CAPSULE TWICE A DAY 180 capsule 1  . polyethylene glycol (MIRALAX / GLYCOLAX) packet 17grams in 16 oz of water  twice a day until bowel movement.  LAXITIVE.  Restart if two days since last bowel movement (Patient not taking: Reported on 01/25/2020) 60 each 0  . rosuvastatin (CRESTOR) 20 MG tablet Take 1 tablet (20 mg total) by mouth daily. 90 tablet 1  . vitamin B-12 (CYANOCOBALAMIN) 1000 MCG tablet Take 1,000 mcg by mouth daily.    . Wheat Dextrin (BENEFIBER) CHEW Chew 1 tablet by mouth daily.     No current facility-administered medications for this visit.     Past Surgical History:  Procedure Laterality Date  . ABDOMINAL HYSTERECTOMY    . CARDIAC CATHETERIZATION N/A 01/22/2015   Procedure: Right/Left Heart Cath and Coronary Angiography;  Surgeon: Wellington Hampshire, MD;  Location: [place] [facility];  Service: Cardiovascular;  Laterality: N/A;  . CARPAL TUNNEL RELEASE    . CHOLECYSTECTOMY    . COLONOSCOPY N/A 03/31/2018   Procedure: COLONOSCOPY;  Surgeon: Rogene Houston, MD;  [facility];  Service: Endoscopy;  Laterality: N/A;  2:45  . JOINT REPLACEMENT    . LEFT HEART CATH AND CORONARY ANGIOGRAPHY N/A 04/28/2018   Procedure: LEFT HEART CATH AND CORONARY ANGIOGRAPHY;  Surgeon: Belva Crome, MD;  Location: [place] [facility];  Service: Cardiovascular;  Laterality: N/A;  . POLYPECTOMY  03/31/2018   Procedure: POLYPECTOMY;  Surgeon: Rogene Houston, MD;  [facility];  Service: Endoscopy;;  splen. flex, cecum  . 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 ROTATOR CUFF REPAIR Right 07/08/2009  . TOTAL ABDOMINAL HYSTERECTOMY W/ BILATERAL SALPINGOOPHORECTOMY    . TOTAL KNEE ARTHROPLASTY Left 04/08/2015   Procedure: LEFT TOTAL KNEE ARTHROPLASTY;  Surgeon: Elsie Saas, MD;  Location: [place];  Service: Orthopedics;  Laterality: Left;  . TOTAL SHOULDER ARTHROPLASTY Left 06/02/2017   Procedure: TOTAL SHOULDER ARTHROPLASTY;  Surgeon: Hiram Gash, MD;  Location: [place];  Service: Orthopedics;  Laterality: Left;     Allergies  Allergen Reactions  . Ace Inhibitors Cough  . Gemfibrozil  Other (See Comments)    Muscle Aches  . Moxifloxacin Rash  . Ranexa [Ranolazine] Other (See Comments)    "NIGHTMARES"      Family History  Problem Relation Age of Onset  . CAD Other   . Hypertension Other   . Heart attack Other   . Cancer Other   . CAD Mother   . Cancer Mother   . CAD Father   . Diabetes Sister   . CAD Sister   . Leukemia  Brother      Social History Ms. Westergren reports that she has never smoked. She has never used smokeless tobacco. Ms. Kitzmiller reports no history of alcohol use.   Review of Systems CONSTITUTIONAL: +generalized fatigue HEENT: Eyes: No visual loss, blurred vision, double vision or yellow sclerae.No hearing loss, sneezing, congestion, runny nose or sore throat.  SKIN: No rash or itching.  CARDIOVASCULAR: per hpi RESPIRATORY: No shortness of breath, cough or sputum.  GASTROINTESTINAL: No anorexia, nausea, vomiting or diarrhea. No abdominal pain or blood.  GENITOURINARY: No burning on urination, no polyuria NEUROLOGICAL: No headache, dizziness, syncope, paralysis, ataxia, numbness or tingling in the extremities. No change in bowel or bladder control.  MUSCULOSKELETAL: No muscle, back pain, joint pain or stiffness.  LYMPHATICS: No enlarged nodes. No history of splenectomy.  PSYCHIATRIC: No history of depression or anxiety.  ENDOCRINOLOGIC: No reports of sweating, cold or heat intolerance. No polyuria or polydipsia.  Marland Kitchen   Physical Examination Today's Vitals   02/05/20 0958  BP: 122/64  Pulse: 72  SpO2: 96%  Weight: 179 lb 3.2 oz (81.3 kg)  Height: 5\' 2"  (1.575 m)   Body mass index is 32.78 kg/m.  Gen: resting comfortably, no acute distress HEENT: no scleral icterus, pupils equal round and reactive, no palptable cervical adenopathy,  CV: No m/r/g, no jvd Resp: Clear to auscultation bilaterally GI: abdomen is soft, non-tender, non-distended, normal bowel sounds, no hepatosplenomegaly MSK: extremities are warm, no edema.  Skin: warm, no  rash Neuro:  no focal deficits Psych: appropriate affect   Diagnostic Studies  Cardiac catheterization May 12, 2018:   Moderate three-vessel coronary disease, with diffuse proximal to mid calcification in all territories.  Left main 25% diffuse narrowing  Proximal to mid LAD 40 to 50%. Unchanged from prior angiogram  Proximal 30% circumflex. First OM is small with irregularities.  RCA contains a proximal to ostial stent that has 30% proximal narrowing. Luminal irregularities are noted throughout the mid right coronary. No significant obstruction is seen.  Normal left ventricular function with EF greater than 50%. LVEDP is normal.    Assessment and Plan  1. CAD - recent chest pains,she reports different from prior symptoms - cath Jan 2020 with mild to moderate disease - with new symptoms will plan for lexiscan. Mixed in description but can be exertional, reports some improvement with NG  2. HTN - at goal, continue current meds  3. Hyperlipidemia - continue staitn, request pcp labs. Goal LDL <70, room to titrate crestor if needed.       Arnoldo Lenis, M.D.

## 2020-02-07 ENCOUNTER — Encounter (HOSPITAL_BASED_OUTPATIENT_CLINIC_OR_DEPARTMENT_OTHER)
Admission: RE | Admit: 2020-02-07 | Discharge: 2020-02-07 | Disposition: A | Discharge status: Home / Self Care | Payer: Medicare Other | Source: Ambulatory Visit | Attending: Cardiology | Admitting: Cardiology

## 2020-02-07 ENCOUNTER — Other Ambulatory Visit: Payer: Self-pay

## 2020-02-07 ENCOUNTER — Encounter (HOSPITAL_COMMUNITY)
Admission: RE | Admit: 2020-02-07 | Discharge: 2020-02-07 | Disposition: A | Discharge status: Home / Self Care | Payer: Medicare Other | Source: Ambulatory Visit | Attending: Cardiology | Admitting: Cardiology

## 2020-02-07 ENCOUNTER — Encounter (HOSPITAL_COMMUNITY): Payer: Self-pay

## 2020-02-07 DIAGNOSIS — R079 Chest pain, unspecified: Secondary | ICD-10-CM | POA: Diagnosis not present

## 2020-02-07 LAB — NM MYOCAR MULTI W/SPECT W/WALL MOTION / EF
LV dias vol: 59 mL (ref 46–106)
LV sys vol: 14 mL
Peak HR: 100 {beats}/min
RATE: 0.45
Rest HR: 81 {beats}/min
SDS: 1
SRS: 5
SSS: 6
TID: 1.44

## 2020-02-07 MED ORDER — SODIUM CHLORIDE FLUSH 0.9 % IV SOLN
INTRAVENOUS | Status: AC
  Administered 2020-02-07: 10 mL via INTRAVENOUS
  Filled 2020-02-07: qty 10

## 2020-02-07 MED ORDER — REGADENOSON 0.4 MG/5ML IV SOLN
INTRAVENOUS | Status: AC
  Administered 2020-02-07: 0.4 mg via INTRAVENOUS
  Filled 2020-02-07: qty 5

## 2020-02-07 MED ORDER — TECHNETIUM TC 99M TETROFOSMIN IV KIT
30.0000 | PACK | Freq: Once | INTRAVENOUS | Status: AC | PRN
  Administered 2020-02-07: 32.7 via INTRAVENOUS

## 2020-02-07 MED ORDER — TECHNETIUM TC 99M TETROFOSMIN IV KIT
10.0000 | PACK | Freq: Once | INTRAVENOUS | Status: AC | PRN
  Administered 2020-02-07: 10.4 via INTRAVENOUS

## 2020-02-08 DIAGNOSIS — Z130 Encounter for screening for diseases of the blood and blood-forming organs and certain disorders involving the immune mechanism: Secondary | ICD-10-CM | POA: Diagnosis not present

## 2020-02-08 DIAGNOSIS — Z202 Contact with and (suspected) exposure to infections with a predominantly sexual mode of transmission: Secondary | ICD-10-CM | POA: Diagnosis not present

## 2020-02-08 DIAGNOSIS — K2100 Gastro-esophageal reflux disease with esophagitis, without bleeding: Secondary | ICD-10-CM | POA: Diagnosis not present

## 2020-02-08 DIAGNOSIS — I10 Essential (primary) hypertension: Secondary | ICD-10-CM | POA: Diagnosis not present

## 2020-02-08 DIAGNOSIS — I259 Chronic ischemic heart disease, unspecified: Secondary | ICD-10-CM | POA: Diagnosis not present

## 2020-02-08 DIAGNOSIS — D518 Other vitamin B12 deficiency anemias: Secondary | ICD-10-CM | POA: Diagnosis not present

## 2020-02-08 DIAGNOSIS — E039 Hypothyroidism, unspecified: Secondary | ICD-10-CM | POA: Diagnosis not present

## 2020-02-08 DIAGNOSIS — N183 Chronic kidney disease, stage 3 unspecified: Secondary | ICD-10-CM | POA: Diagnosis not present

## 2020-02-08 DIAGNOSIS — E876 Hypokalemia: Secondary | ICD-10-CM | POA: Diagnosis not present

## 2020-02-12 ENCOUNTER — Telehealth (INDEPENDENT_AMBULATORY_CARE_PROVIDER_SITE_OTHER): Payer: Self-pay | Admitting: Gastroenterology

## 2020-02-12 ENCOUNTER — Telehealth: Payer: Self-pay | Admitting: Cardiology

## 2020-02-12 DIAGNOSIS — M109 Gout, unspecified: Secondary | ICD-10-CM | POA: Diagnosis not present

## 2020-02-12 DIAGNOSIS — K589 Irritable bowel syndrome without diarrhea: Secondary | ICD-10-CM | POA: Diagnosis not present

## 2020-02-12 DIAGNOSIS — R252 Cramp and spasm: Secondary | ICD-10-CM | POA: Diagnosis not present

## 2020-02-12 DIAGNOSIS — I10 Essential (primary) hypertension: Secondary | ICD-10-CM | POA: Diagnosis not present

## 2020-02-12 DIAGNOSIS — R42 Dizziness and giddiness: Secondary | ICD-10-CM | POA: Diagnosis not present

## 2020-02-12 DIAGNOSIS — R233 Spontaneous ecchymoses: Secondary | ICD-10-CM | POA: Diagnosis not present

## 2020-02-12 DIAGNOSIS — I200 Unstable angina: Secondary | ICD-10-CM | POA: Diagnosis not present

## 2020-02-12 DIAGNOSIS — Z6833 Body mass index (BMI) 33.0-33.9, adult: Secondary | ICD-10-CM | POA: Diagnosis not present

## 2020-02-12 NOTE — Telephone Encounter (Signed)
I left Annette Sharp a detailed message as to stop the dicyclomine do not take both medications together

## 2020-02-12 NOTE — Telephone Encounter (Signed)
Pt voiced understanding - routed to pcp    Stress test looks good, no signs of any significant blockages.    J BrancH MD

## 2020-02-12 NOTE — Telephone Encounter (Signed)
Patient left message stating she was prescribed hyoscyamine and wants to know if she is to stop the dicyclomine - please advise - [phone]

## 2020-02-12 NOTE — Telephone Encounter (Signed)
Patient is calling for recent test results.she is going to her PCP 200 pm and wanted to let hm know of results.

## 2020-02-12 NOTE — Telephone Encounter (Signed)
Yes please let her know that she should not take Bentyl and hyoscyamine together, they are in the same family she does not need both.  Thanks.

## 2020-02-29 ENCOUNTER — Other Ambulatory Visit: Payer: Self-pay | Admitting: Family Medicine

## 2020-03-31 ENCOUNTER — Other Ambulatory Visit (INDEPENDENT_AMBULATORY_CARE_PROVIDER_SITE_OTHER): Payer: Self-pay | Admitting: Internal Medicine

## 2020-04-01 NOTE — Telephone Encounter (Signed)
Last seen by Liborio Nixon 01/25/2020 for IBS

## 2020-04-16 ENCOUNTER — Telehealth: Payer: Self-pay | Admitting: Cardiology

## 2020-04-16 NOTE — Telephone Encounter (Signed)
  Patient Consent for Virtual Visit         Annette Sharp has provided verbal consent on 04/16/2020 for a virtual visit (video or telephone).   CONSENT FOR VIRTUAL VISIT FOR:  Annette Sharp  By participating in this virtual visit I agree to the following:  I hereby voluntarily request, consent and authorize [facility] and its employed or contracted physicians, physician assistants, nurse practitioners or other licensed health care professionals (the Practitioner), to provide me with telemedicine health care services (the "Services") as deemed necessary by the treating Practitioner. I acknowledge and consent to receive the Services by the Practitioner via telemedicine. I understand that the telemedicine visit will involve communicating with the Practitioner through live audiovisual communication technology and the disclosure of certain medical information by electronic transmission. I acknowledge that I have been given the opportunity to request an in-person assessment or other available alternative prior to the telemedicine visit and am voluntarily participating in the telemedicine visit.  I understand that I have the right to withhold or withdraw my consent to the use of telemedicine in the course of my care at any time, without affecting my right to future care or treatment, and that the Practitioner or I may terminate the telemedicine visit at any time. I understand that I have the right to inspect all information obtained and/or recorded in the course of the telemedicine visit and may receive copies of available information for a reasonable fee.  I understand that some of the potential risks of receiving the Services via telemedicine include:  Marland Kitchen Delay or interruption in medical evaluation due to technological equipment failure or disruption; . Information transmitted may not be sufficient (e.g. poor resolution of images) to allow for appropriate medical decision making by the Practitioner;  and/or  . In rare instances, security protocols could fail, causing a breach of personal health information.  Furthermore, I acknowledge that it is my responsibility to provide information about my medical history, conditions and care that is complete and accurate to the best of my ability. I acknowledge that Practitioner's advice, recommendations, and/or decision may be based on factors not within their control, such as incomplete or inaccurate data provided by me or distortions of diagnostic images or specimens that may result from electronic transmissions. I understand that the practice of medicine is not an exact science and that Practitioner makes no warranties or guarantees regarding treatment outcomes. I acknowledge that a copy of this consent can be made available to me via my patient portal ([facility]), or I can request a printed copy by calling the office of [facility].    I understand that my insurance will be billed for this visit.   I have read or had this consent read to me. . I understand the contents of this consent, which adequately explains the benefits and risks of the Services being provided via telemedicine.  . I have been provided ample opportunity to ask questions regarding this consent and the Services and have had my questions answered to my satisfaction. . I give my informed consent for the services to be provided through the use of telemedicine in my medical care

## 2020-04-17 ENCOUNTER — Encounter: Payer: Self-pay | Admitting: *Deleted

## 2020-04-17 ENCOUNTER — Telehealth (INDEPENDENT_AMBULATORY_CARE_PROVIDER_SITE_OTHER): Payer: Medicare Other | Admitting: Cardiology

## 2020-04-17 ENCOUNTER — Encounter: Payer: Self-pay | Admitting: Cardiology

## 2020-04-17 ENCOUNTER — Telehealth: Payer: Self-pay | Admitting: Cardiology

## 2020-04-17 VITALS — BP 128/98 | Ht 62.0 in | Wt 170.0 lb

## 2020-04-17 DIAGNOSIS — E782 Mixed hyperlipidemia: Secondary | ICD-10-CM

## 2020-04-17 DIAGNOSIS — I10 Essential (primary) hypertension: Secondary | ICD-10-CM

## 2020-04-17 DIAGNOSIS — I2510 Atherosclerotic heart disease of native coronary artery without angina pectoris: Secondary | ICD-10-CM | POA: Diagnosis not present

## 2020-04-17 NOTE — Telephone Encounter (Signed)
Noted  

## 2020-04-17 NOTE — Patient Instructions (Signed)
Medication Instructions:  Continue all current medications.   Labwork: none  Testing/Procedures: none  Follow-Up: 6 months   Any Other Special Instructions Will Be Listed Below (If Applicable).   If you need a refill on your cardiac medications before your next appointment, please call your pharmacy.  

## 2020-04-17 NOTE — Telephone Encounter (Signed)
Patient returning call for virtual appt

## 2020-04-17 NOTE — Progress Notes (Signed)
Virtual Visit via Telephone Note   This visit type was conducted due to national recommendations for restrictions regarding the COVID-19 Pandemic (e.g. social distancing) in an effort to limit this patient's exposure and mitigate transmission in our community.  Due to her co-morbid illnesses, this patient is at least at moderate risk for complications without adequate follow up.  This format is felt to be most appropriate for this patient at this time.  The patient did not have access to video technology/had technical difficulties with video requiring transitioning to audio format only (telephone).  All issues noted in this document were discussed and addressed.  No physical exam could be performed with this format.  Please refer to the patient's chart for her  consent to telehealth for [facility].    Date:  04/17/2020   ID:  Annette Sharp, DOB [birth year], MRN [id number] The patient was identified using 2 identifiers.  Patient Location: Home Provider Location: Office/Clinic  PCP:  Manon Hilding, MD  Cardiologist:  Carlyle Dolly, MD  Electrophysiologist:  None   Evaluation Performed:  Follow-Up Visit  Chief Complaint:  Follow up visit  History of Present Illness:    Annette Sharp is a 80 y.o. female seen today for follow up of the following medical problems.   1. CAD - Prior DES to RCA in 2007.  - cath Jan 2020 without significant disease  - some recent chest pain. Pulling like pain in midchest into right shoulder, has had prior shoulder replacements - usually occurs with activity.Not positional. Lasts about 10 minutes. Some SOB, feels dizzy/lightheaded. - highest level of activity is house work, or walking to [employer].  - better with with SL NG  01/2020 nuclear stress: no ischemia, short runs of atach.  - symptoms have improved since last visit, less in frequency   2. HTN - last visit 10/2019 with PA Leonides Sake hydralazine 25mg  bid added - compliant with meds   3.  OSA - not able to tolerate cpap   4. Hyperlipidemia - 08/2018 TC 157 HDL 61 TG LD 82 - she is crestor 20mg   - labs followed by pcp  5. History of prior CVA - has been on plavix.   Has not had covid vaccine, not intersted   The patient does not have symptoms concerning for COVID-19 infection (fever, chills, cough, or new shortness of breath).    Past Medical History:  Diagnosis Date  . Anxiety   . Back pain   . Cerebrovascular disease    NONOBSTRUCTIVE CVA BY CAROTID DOPPLERS OCTOBER 2008  . Chronic renal insufficiency    GFR 45 ML'S PER MINUTE  . COPD (chronic obstructive pulmonary disease) ([place])   . Coronary artery disease    a. s/p DES to RCA in 2007 b. patent stent by cath in 12/2014 with nonobstructive disease along LAD and RCA  . Coronary atherosclerosis of native coronary artery    NEGATIVE LEXISCAN FOR ISCHEMIA MARCH 23,2010 NORMAL LV FUNCTTION  . Depression   . Dyspnea    with exertion  . GERD (gastroesophageal reflux disease)   . Headache    chronic headache from fall in December  . Hyperlipidemia   . Hypertension   . Hypothyroidism   . Other and unspecified angina pectoris    ANGINA  . Overweight(278.02)    OBESITY  . Peptic ulcer disease   . Primary localized osteoarthritis of left knee   . Sleep apnea    SEVERE OBSTRUCTIVE    cpap  .  Stroke [facility])    affected memory  . Thyroid disease    TREATED HYPOTHYROIDISM   Past Surgical History:  Procedure Laterality Date  . ABDOMINAL HYSTERECTOMY    . CARDIAC CATHETERIZATION N/A 01/22/2015   Procedure: Right/Left Heart Cath and Coronary Angiography;  Surgeon: Wellington Hampshire, MD;  Location: [facility];  Service: Cardiovascular;  Laterality: N/A;  . CARPAL TUNNEL RELEASE    . CHOLECYSTECTOMY    . COLONOSCOPY N/A 03/31/2018   Procedure: COLONOSCOPY;  Surgeon: Rogene Houston, MD;  [facility];  Service: Endoscopy;  Laterality: N/A;  2:45  . JOINT REPLACEMENT    . LEFT HEART CATH AND  CORONARY ANGIOGRAPHY N/A 04/28/2018   Procedure: LEFT HEART CATH AND CORONARY ANGIOGRAPHY;  Surgeon: Belva Crome, MD;  Location: [facility];  Service: Cardiovascular;  Laterality: N/A;  . POLYPECTOMY  03/31/2018   Procedure: POLYPECTOMY;  Surgeon: Rogene Houston, MD;  [facility];  Service: Endoscopy;;  splen. flex, cecum  . 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 ROTATOR CUFF REPAIR Right 07/08/2009  . TOTAL ABDOMINAL HYSTERECTOMY W/ BILATERAL SALPINGOOPHORECTOMY    . TOTAL KNEE ARTHROPLASTY Left 04/08/2015   Procedure: LEFT TOTAL KNEE ARTHROPLASTY;  Surgeon: Elsie Saas, MD;  Location: [facility];  Service: Orthopedics;  Laterality: Left;  . TOTAL SHOULDER ARTHROPLASTY Left 06/02/2017   Procedure: TOTAL SHOULDER ARTHROPLASTY;  Surgeon: Hiram Gash, MD;  Location: [place];  Service: Orthopedics;  Laterality: Left;     No outpatient medications have been marked as taking for the 04/17/20 encounter (Appointment) with Arnoldo Lenis, MD.     Allergies:   Ace inhibitors, Gemfibrozil, Moxifloxacin, and Ranexa [ranolazine]   Social History   Tobacco Use  . Smoking status: Never Smoker  . Smokeless tobacco: Never Used  Vaping Use  . Vaping Use: Never used  Substance Use Topics  . Alcohol use: No    Alcohol/week: 0.0 standard drinks  . Drug use: No     Family Hx: The patient's family history includes CAD in her father, mother, sister, and another family member; Cancer in her mother and another family member; Diabetes in her sister; Heart attack in an other family member; Hypertension in an other family member; Leukemia in her brother.  ROS:   Please see the history of present illness.     All other systems reviewed and are negative.   Prior CV studies:   The following studies were reviewed today:  Cardiac catheterization 04/28/2018:   Moderate three-vessel coronary disease, with diffuse proximal to mid calcification in all territories.  Left main 25%  diffuse narrowing  Proximal to mid LAD 40 to 50%. Unchanged from prior angiogram  Proximal 30% circumflex. First OM is small with irregularities.  RCA contains a proximal to ostial stent that has 30% proximal narrowing. Luminal irregularities are noted throughout the mid right coronary. No significant obstruction is seen.  Normal left ventricular function with EF greater than 50%. LVEDP is normal.   01/2020 nuclear stress  There was no ST segment deviation noted during stress. Short runs of atrial tachycardia with just mildly elevated rates around 110  The study is normal. There are no perfusion defects  This is a low risk study.  The left ventricular ejection fraction is hyperdynamic (>65%).  Nonspecific finding of elevated TID in absence of perfusion defects   Labs/Other Tests and Data Reviewed:    EKG:  No ECG reviewed.  Recent Labs: No results  found for requested labs within last 8760 hours.   Recent Lipid Panel Lab Results  Component Value Date/Time   CHOL 157 09/09/2018 09:41 AM   TRIG 60 09/09/2018 09:41 AM   HDL 61 09/09/2018 09:41 AM   CHOLHDL 2.6 09/09/2018 09:41 AM   LDLCALC 82 09/09/2018 09:41 AM    Wt Readings from Last 3 Encounters:  02/05/20 179 lb 3.2 oz (81.3 kg)  01/25/20 177 lb 12.8 oz (80.6 kg)  11/07/19 178 lb (80.7 kg)     Risk Assessment/Calculations:      Objective:    Vital Signs:   Today's Vitals   04/17/20 1404  BP: (!) 128/98  Weight: 170 lb (77.1 kg)  Height: 5\' 2"  (1.575 m)   Body mass index is 31.09 kg/m. Normal affect. Normal speech pattern and tone. Comfortable, no apparent distress. No audible signs of sob or wheezing.   ASSESSMENT & PLAN:    1. CAD - chronic chest pains improved since last visit on long acting nitrates - recent stress test without ischemia -continue curren tmeds  2. HTN - reasonable control, continue current meds  3. Hyperlipidemia -request labs from pcp, continue statin         COVID-19 Education: The signs and symptoms of COVID-19 were discussed with the patient and how to seek care for testing (follow up with PCP or arrange E-visit).  The importance of social distancing was discussed today.  Time:   Today, I have spent 14 minutes with the patient with telehealth technology discussing the above problems.     Medication Adjustments/Labs and Tests Ordered: Current medicines are reviewed at length with the patient today.  Concerns regarding medicines are outlined above.   Tests Ordered: No orders of the defined types were placed in this encounter.   Medication Changes: No orders of the defined types were placed in this encounter.   Follow Up:  In Person in 6 month(s)  Signed, Carlyle Dolly, MD  04/17/2020 9:08 AM    [place]

## 2020-05-09 DIAGNOSIS — R778 Other specified abnormalities of plasma proteins: Secondary | ICD-10-CM | POA: Diagnosis not present

## 2020-05-09 DIAGNOSIS — E876 Hypokalemia: Secondary | ICD-10-CM | POA: Diagnosis not present

## 2020-05-09 DIAGNOSIS — U071 COVID-19: Secondary | ICD-10-CM | POA: Diagnosis not present

## 2020-05-09 DIAGNOSIS — R0602 Shortness of breath: Secondary | ICD-10-CM | POA: Diagnosis not present

## 2020-05-09 DIAGNOSIS — R7989 Other specified abnormal findings of blood chemistry: Secondary | ICD-10-CM | POA: Diagnosis not present

## 2020-05-09 DIAGNOSIS — E875 Hyperkalemia: Secondary | ICD-10-CM | POA: Diagnosis not present

## 2020-05-09 DIAGNOSIS — R944 Abnormal results of kidney function studies: Secondary | ICD-10-CM | POA: Diagnosis not present

## 2020-06-03 ENCOUNTER — Other Ambulatory Visit: Payer: Self-pay | Admitting: Family Medicine

## 2020-06-04 DIAGNOSIS — H9193 Unspecified hearing loss, bilateral: Secondary | ICD-10-CM | POA: Diagnosis not present

## 2020-06-06 DIAGNOSIS — E039 Hypothyroidism, unspecified: Secondary | ICD-10-CM | POA: Diagnosis not present

## 2020-06-06 DIAGNOSIS — K219 Gastro-esophageal reflux disease without esophagitis: Secondary | ICD-10-CM | POA: Diagnosis not present

## 2020-06-06 DIAGNOSIS — N183 Chronic kidney disease, stage 3 unspecified: Secondary | ICD-10-CM | POA: Diagnosis not present

## 2020-06-06 DIAGNOSIS — E7801 Familial hypercholesterolemia: Secondary | ICD-10-CM | POA: Diagnosis not present

## 2020-06-06 DIAGNOSIS — E7800 Pure hypercholesterolemia, unspecified: Secondary | ICD-10-CM | POA: Diagnosis not present

## 2020-06-06 DIAGNOSIS — I10 Essential (primary) hypertension: Secondary | ICD-10-CM | POA: Diagnosis not present

## 2020-06-11 DIAGNOSIS — I10 Essential (primary) hypertension: Secondary | ICD-10-CM | POA: Diagnosis not present

## 2020-06-11 DIAGNOSIS — E039 Hypothyroidism, unspecified: Secondary | ICD-10-CM | POA: Diagnosis not present

## 2020-06-11 DIAGNOSIS — Z6831 Body mass index (BMI) 31.0-31.9, adult: Secondary | ICD-10-CM | POA: Diagnosis not present

## 2020-06-11 DIAGNOSIS — Z23 Encounter for immunization: Secondary | ICD-10-CM | POA: Diagnosis not present

## 2020-06-11 DIAGNOSIS — G3184 Mild cognitive impairment, so stated: Secondary | ICD-10-CM | POA: Diagnosis not present

## 2020-06-11 DIAGNOSIS — R252 Cramp and spasm: Secondary | ICD-10-CM | POA: Diagnosis not present

## 2020-06-11 DIAGNOSIS — R42 Dizziness and giddiness: Secondary | ICD-10-CM | POA: Diagnosis not present

## 2020-06-24 DIAGNOSIS — Z1283 Encounter for screening for malignant neoplasm of skin: Secondary | ICD-10-CM | POA: Diagnosis not present

## 2020-06-24 DIAGNOSIS — Z85820 Personal history of malignant melanoma of skin: Secondary | ICD-10-CM | POA: Diagnosis not present

## 2020-06-24 DIAGNOSIS — Z85828 Personal history of other malignant neoplasm of skin: Secondary | ICD-10-CM | POA: Diagnosis not present

## 2020-06-24 DIAGNOSIS — L570 Actinic keratosis: Secondary | ICD-10-CM | POA: Diagnosis not present

## 2020-07-23 DIAGNOSIS — E039 Hypothyroidism, unspecified: Secondary | ICD-10-CM | POA: Diagnosis not present

## 2020-08-15 DIAGNOSIS — M26621 Arthralgia of right temporomandibular joint: Secondary | ICD-10-CM | POA: Diagnosis not present

## 2020-10-02 DIAGNOSIS — K2100 Gastro-esophageal reflux disease with esophagitis, without bleeding: Secondary | ICD-10-CM | POA: Diagnosis not present

## 2020-10-02 DIAGNOSIS — N183 Chronic kidney disease, stage 3 unspecified: Secondary | ICD-10-CM | POA: Diagnosis not present

## 2020-10-02 DIAGNOSIS — R5383 Other fatigue: Secondary | ICD-10-CM | POA: Diagnosis not present

## 2020-10-02 DIAGNOSIS — E039 Hypothyroidism, unspecified: Secondary | ICD-10-CM | POA: Diagnosis not present

## 2020-10-02 DIAGNOSIS — E7801 Familial hypercholesterolemia: Secondary | ICD-10-CM | POA: Diagnosis not present

## 2020-10-02 DIAGNOSIS — E7800 Pure hypercholesterolemia, unspecified: Secondary | ICD-10-CM | POA: Diagnosis not present

## 2020-10-02 DIAGNOSIS — M10079 Idiopathic gout, unspecified ankle and foot: Secondary | ICD-10-CM | POA: Diagnosis not present

## 2020-10-03 DIAGNOSIS — R252 Cramp and spasm: Secondary | ICD-10-CM | POA: Diagnosis not present

## 2020-10-03 DIAGNOSIS — I200 Unstable angina: Secondary | ICD-10-CM | POA: Diagnosis not present

## 2020-10-03 DIAGNOSIS — Z6830 Body mass index (BMI) 30.0-30.9, adult: Secondary | ICD-10-CM | POA: Diagnosis not present

## 2020-10-03 DIAGNOSIS — R42 Dizziness and giddiness: Secondary | ICD-10-CM | POA: Diagnosis not present

## 2020-10-03 DIAGNOSIS — G3184 Mild cognitive impairment, so stated: Secondary | ICD-10-CM | POA: Diagnosis not present

## 2020-10-03 DIAGNOSIS — I10 Essential (primary) hypertension: Secondary | ICD-10-CM | POA: Diagnosis not present

## 2020-10-03 DIAGNOSIS — E039 Hypothyroidism, unspecified: Secondary | ICD-10-CM | POA: Diagnosis not present

## 2020-10-17 ENCOUNTER — Ambulatory Visit: Payer: Medicare Other | Admitting: Cardiology

## 2020-10-30 ENCOUNTER — Ambulatory Visit: Payer: Medicare Other | Admitting: Cardiology

## 2020-10-30 ENCOUNTER — Other Ambulatory Visit: Payer: Self-pay | Admitting: Family Medicine

## 2020-10-30 NOTE — Progress Notes (Deleted)
Clinical Summary Ms. Streed is a 80 y.o.female seen today for follow up of the following medical problems.    1. CAD - Prior DES to RCA in 2007. - cath Jan 2020 without significant disease   - some recent chest pain. Pulling like pain in midchest into right shoulder, has had prior shoulder replacements - usually occurs with activity.Not positional. Lasts about 10 minutes. Some SOB, feels dizzy/lightheaded. - highest level of activity is house work, or walking to [employer]. - better with with SL NG   01/2020 nuclear stress: no ischemia, short runs of atach.  - symptoms have improved since last visit, less in frequency     2. HTN - last visit 10/2019 with PA Leonides Sake hydralazine '25mg'$  bid added - compliant with meds     3. OSA - not able to tolerate cpap     4. Hyperlipidemia - 08/2018 TC 157 HDL 61 TG LD 82 - she is crestor '20mg'$    - labs followed by pcp   5. History of prior CVA - has been on plavix.   Past Medical History:  Diagnosis Date   Anxiety    Back pain    Cerebrovascular disease    NONOBSTRUCTIVE CVA BY CAROTID DOPPLERS OCTOBER 2008   Chronic renal insufficiency    GFR 24 ML'S PER MINUTE   COPD (chronic obstructive pulmonary disease) ([facility])    Coronary artery disease    a. s/p DES to RCA in 2007 b. patent stent by cath in 12/2014 with nonobstructive disease along LAD and RCA   Coronary atherosclerosis of native coronary artery    NEGATIVE LEXISCAN FOR ISCHEMIA MARCH 23,2010 NORMAL LV FUNCTTION   Depression    Dyspnea    with exertion   GERD (gastroesophageal reflux disease)    Headache    chronic headache from fall in December   Hyperlipidemia    Hypertension    Hypothyroidism    Other and unspecified angina pectoris    ANGINA   Overweight(278.02)    OBESITY   Peptic ulcer disease    Primary localized osteoarthritis of left knee    Sleep apnea    SEVERE OBSTRUCTIVE    cpap   Stroke ([place])    affected memory   Thyroid disease    TREATED  HYPOTHYROIDISM     Allergies  Allergen Reactions   Ace Inhibitors Cough   Gemfibrozil Other (See Comments)    Muscle Aches   Moxifloxacin Rash   Ranexa [Ranolazine] Other (See Comments)    "NIGHTMARES"     Current Outpatient Medications  Medication Sig Dispense Refill   acyclovir (ZOVIRAX) 400 MG tablet Take 400 mg by mouth daily.     albuterol (PROVENTIL HFA;VENTOLIN HFA) 108 (90 Base) MCG/ACT inhaler Inhale 2 puffs into the lungs every 6 (six) hours as needed for wheezing or shortness of breath. 1 Inhaler 2   amLODipine (NORVASC) 10 MG tablet Take 10 mg by mouth every morning.  3   aspirin EC 81 MG tablet Take 1 tablet (81 mg total) by mouth at bedtime.     betamethasone dipropionate (DIPROLENE) 0.05 % cream Apply 1 application topically 2 (two) times daily as needed (atrophic vaginitis).      cetirizine (ZYRTEC) 10 MG tablet Take 10 mg by mouth daily.     Cholecalciferol (VITAMIN D3) 50 MCG (2000 UT) TABS Take 2,000 Units by mouth daily.     citalopram (CELEXA) 20 MG tablet Take 20 mg by mouth every morning.  clopidogrel (PLAVIX) 75 MG tablet Take 1 tablet (75 mg total) by mouth every morning.     hydrALAZINE (APRESOLINE) 25 MG tablet TAKE 1 TABLET BY MOUTH TWICE A DAY 180 tablet 3   hyoscyamine (LEVBID) 0.375 MG 12 hr tablet Take 1 tablet (0.375 mg total) by mouth daily. 30 tablet 11   isosorbide mononitrate (IMDUR) 60 MG 24 hr tablet Take 1.5 tablets (90 mg total) by mouth 2 (two) times daily. 270 tablet 3   levothyroxine (SYNTHROID, LEVOTHROID) 88 MCG tablet Take 88 mcg by mouth every morning.     Melatonin 5 MG SUBL Take 5 mg by mouth at bedtime.      nitroGLYCERIN (NITROSTAT) 0.4 MG SL tablet Place 1 tablet (0.4 mg total) under the tongue every 5 (five) minutes x 3 doses as needed for chest pain (if no relief after 3rd dose, proceed to the ED for an evaluation). 25 tablet 3   omeprazole (PRILOSEC) 20 MG capsule TAKE 1 CAPSULE TWICE A DAY 180 capsule 1   rosuvastatin  (CRESTOR) 20 MG tablet TAKE 1 TABLET BY MOUTH EVERY DAY 90 tablet 1   vitamin B-12 (CYANOCOBALAMIN) 1000 MCG tablet Take 1,000 mcg by mouth daily.     Wheat Dextrin (BENEFIBER) CHEW Chew 1 tablet by mouth daily.     No current facility-administered medications for this visit.     Past Surgical History:  Procedure Laterality Date   ABDOMINAL HYSTERECTOMY     CARDIAC CATHETERIZATION N/A 01/22/2015   Procedure: Right/Left Heart Cath and Coronary Angiography;  Surgeon: Wellington Hampshire, MD;  Location: [facility];  Service: Cardiovascular;  Laterality: N/A;   CARPAL TUNNEL RELEASE     CHOLECYSTECTOMY     COLONOSCOPY N/A 03/31/2018   Procedure: COLONOSCOPY;  Surgeon: Rogene Houston, MD;  [facility];  Service: Endoscopy;  Laterality: N/A;  2:45   JOINT REPLACEMENT     LEFT HEART CATH AND CORONARY ANGIOGRAPHY N/A 04/28/2018   Procedure: LEFT HEART CATH AND CORONARY ANGIOGRAPHY;  Surgeon: Belva Crome, MD;  Location: [facility];  Service: Cardiovascular;  Laterality: N/A;   POLYPECTOMY  03/31/2018   Procedure: POLYPECTOMY;  Surgeon: Rogene Houston, MD;  [facility];  Service: Endoscopy;;  splen. flex, cecum   REPLACEMENT TOTAL KNEE Right 07/28/2010   ROTATOR CUFF REPAIR Right 07/08/2009   TOTAL ABDOMINAL HYSTERECTOMY W/ BILATERAL SALPINGOOPHORECTOMY     TOTAL KNEE ARTHROPLASTY Left 04/08/2015   Procedure: LEFT TOTAL KNEE ARTHROPLASTY;  Surgeon: Elsie Saas, MD;  Location: [place];  Service: Orthopedics;  Laterality: Left;   TOTAL SHOULDER ARTHROPLASTY Left 06/02/2017   Procedure: TOTAL SHOULDER ARTHROPLASTY;  Surgeon: Hiram Gash, MD;  Location: [place];  Service: Orthopedics;  Laterality: Left;     Allergies  Allergen Reactions   Ace Inhibitors Cough   Gemfibrozil Other (See Comments)    Muscle Aches   Moxifloxacin Rash   Ranexa [Ranolazine] Other (See Comments)    "NIGHTMARES"      Family History  Problem Relation Age of Onset   CAD Other     Hypertension Other    Heart attack Other    Cancer Other    CAD Mother    Cancer Mother    CAD Father    Diabetes Sister    CAD Sister    Leukemia Brother      Social History Ms. Lippman reports that she has never smoked. She has never used smokeless tobacco. Ms. Bowerman  reports no history of alcohol use.   Review of Systems CONSTITUTIONAL: No weight loss, fever, chills, weakness or fatigue.  HEENT: Eyes: No visual loss, blurred vision, double vision or yellow sclerae.No hearing loss, sneezing, congestion, runny nose or sore throat.  SKIN: No rash or itching.  CARDIOVASCULAR:  RESPIRATORY: No shortness of breath, cough or sputum.  GASTROINTESTINAL: No anorexia, nausea, vomiting or diarrhea. No abdominal pain or blood.  GENITOURINARY: No burning on urination, no polyuria NEUROLOGICAL: No headache, dizziness, syncope, paralysis, ataxia, numbness or tingling in the extremities. No change in bowel or bladder control.  MUSCULOSKELETAL: No muscle, back pain, joint pain or stiffness.  LYMPHATICS: No enlarged nodes. No history of splenectomy.  PSYCHIATRIC: No history of depression or anxiety.  ENDOCRINOLOGIC: No reports of sweating, cold or heat intolerance. No polyuria or polydipsia.  Marland Kitchen   Physical Examination There were no vitals filed for this visit. There were no vitals filed for this visit.  Gen: resting comfortably, no acute distress HEENT: no scleral icterus, pupils equal round and reactive, no palptable cervical adenopathy,  CV Resp: Clear to auscultation bilaterally GI: abdomen is soft, non-tender, non-distended, normal bowel sounds, no hepatosplenomegaly MSK: extremities are warm, no edema.  Skin: warm, no rash Neuro:  no focal deficits Psych: appropriate affect   Diagnostic Studies  Cardiac catheterization 05-02-18:   Moderate three-vessel coronary disease, with diffuse proximal to mid calcification in all territories. Left main 25% diffuse narrowing Proximal to mid  LAD 40 to 50%.  Unchanged from prior angiogram Proximal 30% circumflex.  First OM is small with irregularities. RCA contains a proximal to ostial stent that has 30% proximal narrowing.  Luminal irregularities are noted throughout the mid right coronary.  No significant obstruction is seen. Normal left ventricular function with EF greater than 50%.  LVEDP is normal.     01/2020 nuclear stress There was no ST segment deviation noted during stress. Short runs of atrial tachycardia with just mildly elevated rates around 110 The study is normal. There are no perfusion defects This is a low risk study. The left ventricular ejection fraction is hyperdynamic (>65%). Nonspecific finding of elevated TID in absence of perfusion defects   Assessment and Plan  1. CAD - chronic chest pains improved since last visit on long acting nitrates - recent stress test without ischemia -continue curren tmeds   2. HTN - reasonable control, continue current meds   3. Hyperlipidemia -request labs from pcp, continue statin      Arnoldo Lenis, M.D., F.A.C.C.

## 2020-11-02 DIAGNOSIS — Z20828 Contact with and (suspected) exposure to other viral communicable diseases: Secondary | ICD-10-CM | POA: Diagnosis not present

## 2020-11-13 DIAGNOSIS — E039 Hypothyroidism, unspecified: Secondary | ICD-10-CM | POA: Diagnosis not present

## 2020-11-20 ENCOUNTER — Telehealth: Payer: Self-pay | Admitting: Cardiology

## 2020-11-20 NOTE — Telephone Encounter (Signed)
Returned pt call. No answer. Left msg to call back.  

## 2020-11-20 NOTE — Telephone Encounter (Signed)
New message     Pt c/o Shortness Of Breath: STAT if SOB developed within the last 24 hours or pt is noticeably SOB on the phone  1. Are you currently SOB (can you hear that pt is SOB on the phone)?  yes  2. How long have you been experiencing SOB? It started a couple weeks ago after she had covid and its getting worse , she has been having sharp pain under left breast took nitro and it went away.  Has not noticed any swelling or weight gain, she is actually losing weight   3. Are you SOB when sitting or when up moving around? Both  - she is also extremely weak   4. Are you currently experiencing any other symptoms? no

## 2020-11-21 NOTE — Telephone Encounter (Signed)
Patient called back to speak to [facility]. Advised she is currently in clinic. She can reach her back at [phone]

## 2020-11-21 NOTE — Telephone Encounter (Signed)
Returned pt call. No answer. Left msg to call back.  

## 2020-11-21 NOTE — Telephone Encounter (Signed)
Returned call to pt. No answer. Left msg to call back.  

## 2020-11-22 NOTE — Telephone Encounter (Signed)
Front desk confirmed f/u apt in 3 days, Monday 8/29

## 2020-11-24 DIAGNOSIS — S0990XA Unspecified injury of head, initial encounter: Secondary | ICD-10-CM | POA: Diagnosis not present

## 2020-11-24 DIAGNOSIS — S72002A Fracture of unspecified part of neck of left femur, initial encounter for closed fracture: Secondary | ICD-10-CM | POA: Diagnosis not present

## 2020-11-24 DIAGNOSIS — S2020XA Contusion of thorax, unspecified, initial encounter: Secondary | ICD-10-CM | POA: Diagnosis not present

## 2020-11-24 DIAGNOSIS — R609 Edema, unspecified: Secondary | ICD-10-CM | POA: Diagnosis not present

## 2020-11-24 DIAGNOSIS — M852 Hyperostosis of skull: Secondary | ICD-10-CM | POA: Diagnosis not present

## 2020-11-24 DIAGNOSIS — R102 Pelvic and perineal pain: Secondary | ICD-10-CM | POA: Diagnosis not present

## 2020-11-24 DIAGNOSIS — W010XXA Fall on same level from slipping, tripping and stumbling without subsequent striking against object, initial encounter: Secondary | ICD-10-CM | POA: Diagnosis not present

## 2020-11-24 DIAGNOSIS — S20212A Contusion of left front wall of thorax, initial encounter: Secondary | ICD-10-CM | POA: Diagnosis not present

## 2020-11-24 DIAGNOSIS — I700 Atherosclerosis of aorta: Secondary | ICD-10-CM | POA: Diagnosis not present

## 2020-11-24 DIAGNOSIS — I639 Cerebral infarction, unspecified: Secondary | ICD-10-CM | POA: Diagnosis not present

## 2020-11-24 DIAGNOSIS — S8002XA Contusion of left knee, initial encounter: Secondary | ICD-10-CM | POA: Diagnosis not present

## 2020-11-24 DIAGNOSIS — I6523 Occlusion and stenosis of bilateral carotid arteries: Secondary | ICD-10-CM | POA: Diagnosis not present

## 2020-11-24 DIAGNOSIS — Z96652 Presence of left artificial knee joint: Secondary | ICD-10-CM | POA: Diagnosis not present

## 2020-11-24 DIAGNOSIS — Z96612 Presence of left artificial shoulder joint: Secondary | ICD-10-CM | POA: Diagnosis not present

## 2020-11-24 DIAGNOSIS — M25562 Pain in left knee: Secondary | ICD-10-CM | POA: Diagnosis not present

## 2020-11-24 DIAGNOSIS — Z043 Encounter for examination and observation following other accident: Secondary | ICD-10-CM | POA: Diagnosis not present

## 2020-11-25 ENCOUNTER — Other Ambulatory Visit: Payer: Self-pay

## 2020-11-25 ENCOUNTER — Ambulatory Visit (INDEPENDENT_AMBULATORY_CARE_PROVIDER_SITE_OTHER): Payer: Medicare Other | Admitting: Physician Assistant

## 2020-11-25 ENCOUNTER — Encounter: Payer: Self-pay | Admitting: Physician Assistant

## 2020-11-25 VITALS — BP 144/58 | HR 70 | Ht 62.0 in | Wt 161.0 lb

## 2020-11-25 DIAGNOSIS — R0602 Shortness of breath: Secondary | ICD-10-CM

## 2020-11-25 DIAGNOSIS — I10 Essential (primary) hypertension: Secondary | ICD-10-CM | POA: Diagnosis not present

## 2020-11-25 DIAGNOSIS — E782 Mixed hyperlipidemia: Secondary | ICD-10-CM | POA: Diagnosis not present

## 2020-11-25 DIAGNOSIS — I2510 Atherosclerotic heart disease of native coronary artery without angina pectoris: Secondary | ICD-10-CM

## 2020-11-25 NOTE — Patient Instructions (Signed)
Medication Instructions:  Your physician recommends that you continue on your current medications as directed. Please refer to the Current Medication list given to you today.  *If you need a refill on your cardiac medications before your next appointment, please call your pharmacy*   Lab Work: None If you have labs (blood work) drawn today and your tests are completely normal, you will receive your results only by: [place] (if you have MyChart) OR A paper copy in the mail If you have any lab test that is abnormal or we need to change your treatment, we will call you to review the results.   Testing/Procedures: Your physician recommends that you continue on your current medications as directed. Please refer to the Current Medication list given to you today.    Follow-Up: At [facility], you and your health needs are our priority.  As part of our continuing mission to provide you with exceptional heart care, we have created designated Provider Care Teams.  These Care Teams include your primary Cardiologist (physician) and Advanced Practice Providers (APPs -  Physician Assistants and Nurse Practitioners) who all work together to provide you with the care you need, when you need it.  We recommend signing up for the patient portal called "MyChart".  Sign up information is provided on this After Visit Summary.  MyChart is used to connect with patients for Virtual Visits (Telemedicine).  Patients are able to view lab/test results, encounter notes, upcoming appointments, etc.  Non-urgent messages can be sent to your provider as well.   To learn more about what you can do with MyChart, go to [email or web address].    Your next appointment:   Keep your scheduled follow up appointment with Dr. Harl Bowie in October.    Other Instructions    Two Gram Sodium Diet 2000 mg  What is Sodium? Sodium is a mineral found naturally in many foods. The most significant source of sodium in the  diet is table salt, which is about 40% sodium.  Processed, convenience, and preserved foods also contain a large amount of sodium.  The body needs only 500 mg of sodium daily to function,  A normal diet provides more than enough sodium even if you do not use salt.  Why Limit Sodium? A build up of sodium in the body can cause thirst, increased blood pressure, shortness of breath, and water retention.  Decreasing sodium in the diet can reduce edema and risk of heart attack or stroke associated with high blood pressure.  Keep in mind that there are many other factors involved in these health problems.  Heredity, obesity, lack of exercise, cigarette smoking, stress and what you eat all play a role.  General Guidelines: Do not add salt at the table or in cooking.  One teaspoon of salt contains over 2 grams of sodium. Read food labels Avoid processed and convenience foods Ask your dietitian before eating any foods not dicussed in the menu planning guidelines Consult your physician if you wish to use a salt substitute or a sodium containing medication such as antacids.  Limit milk and milk products to 16 oz (2 cups) per day.  Shopping Hints: READ LABELS!! "Dietetic" does not necessarily mean low sodium. Salt and other sodium ingredients are often added to foods during processing.    Menu Planning Guidelines Food Group Choose More Often Avoid  Beverages (see also the milk group All fruit juices, low-sodium, salt-free vegetables juices, low-sodium carbonated beverages Regular vegetable or tomato juices, commercially softened water  used for drinking or cooking  Breads and Cereals Enriched white, wheat, rye and pumpernickel bread, hard rolls and dinner rolls; muffins, cornbread and waffles; most dry cereals, cooked cereal without added salt; unsalted crackers and breadsticks; low sodium or homemade bread crumbs Bread, rolls and crackers with salted tops; quick breads; instant hot cereals; pancakes;  commercial bread stuffing; self-rising flower and biscuit mixes; regular bread crumbs or cracker crumbs  Desserts and Sweets Desserts and sweets mad with mild should be within allowance Instant pudding mixes and cake mixes  Fats Butter or margarine; vegetable oils; unsalted salad dressings, regular salad dressings limited to 1 Tbs; light, sour and heavy cream Regular salad dressings containing bacon fat, bacon bits, and salt pork; snack dips made with instant soup mixes or processed cheese; salted nuts  Fruits Most fresh, frozen and canned fruits Fruits processed with salt or sodium-containing ingredient (some dried fruits are processed with sodium sulfites        Vegetables Fresh, frozen vegetables and low- sodium canned vegetables Regular canned vegetables, sauerkraut, pickled vegetables, and others prepared in brine; frozen vegetables in sauces; vegetables seasoned with ham, bacon or salt pork  Condiments, Sauces, Miscellaneous  Salt substitute with physician's approval; pepper, herbs, spices; vinegar, lemon or lime juice; hot pepper sauce; garlic powder, onion powder, low sodium soy sauce (1 Tbs.); low sodium condiments (ketchup, chili sauce, mustard) in limited amounts (1 tsp.) fresh ground horseradish; unsalted tortilla chips, pretzels, potato chips, popcorn, salsa (1/4 cup) Any seasoning made with salt including garlic salt, celery salt, onion salt, and seasoned salt; sea salt, rock salt, kosher salt; meat tenderizers; monosodium glutamate; mustard, regular soy sauce, barbecue, sauce, chili sauce, teriyaki sauce, steak sauce, Worcestershire sauce, and most flavored vinegars; canned gravy and mixes; regular condiments; salted snack foods, olives, picles, relish, horseradish sauce, catsup   Food preparation: Try these seasonings Meats:    Pork Sage, onion Serve with applesauce  Chicken Poultry seasoning, thyme, parsley Serve with cranberry sauce  Lamb Curry powder, rosemary, garlic, thyme  Serve with mint sauce or jelly  Veal Marjoram, basil Serve with current jelly, cranberry sauce  Beef Pepper, bay leaf Serve with dry mustard, unsalted chive butter  [place] leaf, dill Serve with unsalted lemon butter, unsalted parsley butter  Vegetables:    Asparagus Lemon juice   Broccoli Lemon juice   Carrots Mustard dressing parsley, mint, nutmeg, glazed with unsalted butter and sugar   Green beans Marjoram, lemon juice, nutmeg,dill seed   Tomatoes Basil, marjoram, onion   Spice /blend for [employer]" 4 tsp ground thyme 1 tsp ground sage 3 tsp ground rosemary 4 tsp ground marjoram   Test your knowledge A product that says "Salt Free" may still contain sodium. True or False Garlic Powder and Hot Pepper Sauce an be used as alternative seasonings.True or False Processed foods have more sodium than fresh foods.  True or False Canned Vegetables have less sodium than froze True or False   WAYS TO DECREASE YOUR SODIUM INTAKE Avoid the use of added salt in cooking and at the table.  Table salt (and other prepared seasonings which contain salt) is probably one of the greatest sources of sodium in the diet.  Unsalted foods can gain flavor from the sweet, sour, and butter taste sensations of herbs and spices.  Instead of using salt for seasoning, try the following seasonings with the foods listed.  Remember: how you use them to enhance natural food flavors is limited only by your creativity... Allspice-Meat,  fish, eggs, fruit, peas, red and yellow vegetables Almond Extract-Fruit baked goods Anise Seed-Sweet breads, fruit, carrots, beets, cottage cheese, cookies (tastes like licorice) Basil-Meat, fish, eggs, vegetables, rice, vegetables salads, soups, sauces Bay Leaf-Meat, fish, stews, poultry Burnet-Salad, vegetables (cucumber-like flavor) Caraway Seed-Bread, cookies, cottage cheese, meat, vegetables, cheese, rice Cardamon-Baked goods, fruit, soups Celery Powder or seed-Salads, salad  dressings, sauces, meatloaf, soup, bread.Do not use  celery salt Chervil-Meats, salads, fish, eggs, vegetables, cottage cheese (parsley-like flavor) Chili Power-Meatloaf, chicken cheese, corn, eggplant, egg dishes Chives-Salads cottage cheese, egg dishes, soups, vegetables, sauces Cilantro-Salsa, casseroles Cinnamon-Baked goods, fruit, pork, lamb, chicken, carrots Cloves-Fruit, baked goods, fish, pot roast, green beans, beets, carrots Coriander-Pastry, cookies, meat, salads, cheese (lemon-orange flavor) Cumin-Meatloaf, fish,cheese, eggs, cabbage,fruit pie (caraway flavor) [employer], fruit, eggs, fish, poultry, cottage cheese, vegetables Dill Seed-Meat, cottage cheese, poultry, vegetables, fish, salads, bread Fennel Seed-Bread, cookies, apples, pork, eggs, fish, beets, cabbage, cheese, Licorice-like flavor Garlic-(buds or powder) Salads, meat, poultry, fish, bread, butter, vegetables, potatoes.Do not  use garlic salt Ginger-Fruit, vegetables, baked goods, meat, fish, poultry Horseradish Root-Meet, vegetables, butter Lemon Juice or Extract-Vegetables, fruit, tea, baked goods, fish salads Mace-Baked goods fruit, vegetables, fish, poultry (taste like nutmeg) Maple Extract-Syrups Marjoram-Meat, chicken, fish, vegetables, breads, green salads (taste like Sage) Mint-Tea, lamb, sherbet, vegetables, desserts, carrots, cabbage Mustard, Dry or Seed-Cheese, eggs, meats, vegetables, poultry Nutmeg-Baked goods, fruit, chicken, eggs, vegetables, desserts Onion Powder-Meat, fish, poultry, vegetables, cheese, eggs, bread, rice salads (Do not use   Onion salt) Orange Extract-Desserts, baked goods Oregano-Pasta, eggs, cheese, onions, pork, lamb, fish, chicken, vegetables, green salads Paprika-Meat, fish, poultry, eggs, cheese, vegetables Parsley Flakes-Butter, vegetables, meat fish, poultry, eggs, bread, salads (certain forms may   Contain sodium Pepper-Meat fish, poultry, vegetables,  eggs Peppermint Extract-Desserts, baked goods Poppy Seed-Eggs, bread, cheese, fruit dressings, baked goods, noodles, vegetables, cottage  [employer], poultry, meat, fish, cauliflower, turnips,eggs bread Saffron-Rice, bread, veal, chicken, fish, eggs Sage-Meat, fish, poultry, onions, eggplant, tomateos, pork, stews Savory-Eggs, salads, poultry, meat, rice, vegetables, soups, pork Tarragon-Meat, poultry, fish, eggs, butter, vegetables (licorice-like flavor)  Thyme-Meat, poultry, fish, eggs, vegetables, (clover-like flavor), sauces, soups Tumeric-Salads, butter, eggs, fish, rice, vegetables (saffron-like flavor) Vanilla Extract-Baked goods, candy Vinegar-Salads, vegetables, meat marinades Walnut Extract-baked goods, candy   2. Choose your Foods Wisely   The following is a list of foods to avoid which are high in sodium:  Meats-Avoid all smoked, canned, salt cured, dried and kosher meat and fish as well as Anchovies   [employer] meats:Bologna, Liverwurst, Pastrami Canned meat or fish  Marinated herring Caviar    Pepperoni Corned Beef   Pizza Dried chipped beef  Salami Frozen breaded fish or meat Salt pork Frankfurters or hot dogs  Sardines Gefilte fish   Sausage Ham (boiled ham, Proscuitto Smoked butt    spiced ham)   Spam      TV Dinners Vegetables Canned vegetables (Regular) Relish Canned mushrooms  Sauerkraut Olives    Tomato juice Pickles  Bakery and Dessert Products Canned puddings  Cream pies Cheesecake   Decorated cakes Cookies  Beverages/Juices Tomato juice, regular  Gatorade   V-8 vegetable juice, regular  Breads and Cereals Biscuit mixes   Salted potato chips, corn chips, pretzels Bread stuffing mixes  Salted crackers and rolls Pancake and waffle mixes Self-rising flour  Seasonings Accent    Meat sauces Barbecue sauce  Meat tenderizer Catsup    Monosodium glutamate (MSG) Celery salt   Onion salt Chili  sauce  Prepared mustard Garlic salt   Salt, seasoned salt, sea salt Gravy mixes   Soy sauce Horseradish   Steak sauce Ketchup   Tartar sauce Lite salt    Teriyaki sauce Marinade mixes   Worcestershire sauce  Others Baking powder   Cocoa and cocoa mixes Baking soda   Commercial casserole mixes Candy-caramels, chocolate  Dehydrated soups    Bars, fudge,nougats  Instant rice and pasta mixes Canned broth or soup  Maraschino cherries Cheese, aged and processed cheese and cheese spreads  Learning Assessment Quiz  Indicated T (for True) or F (for False) for each of the following statements:  _____ Fresh fruits and vegetables and unprocessed grains are generally low in sodium _____ Water may contain a considerable amount of sodium, depending on the source _____ You can always tell if a food is high in sodium by tasting it _____ Certain laxatives my be high in sodium and should be avoided unless prescribed   by a physician or pharmacist _____ Salt substitutes may be used freely by anyone on a sodium restricted diet _____ Sodium is present in table salt, food additives and as a natural component of   most foods _____ Table salt is approximately 90% sodium _____ Limiting sodium intake may help prevent excess fluid accumulation in the body _____ On a sodium-restricted diet, seasonings such as bouillon soy sauce, and    cooking wine should be used in place of table salt _____ On an ingredient list, a product which lists monosodium glutamate as the first   ingredient is an appropriate food to include on a low sodium diet  Circle the best answer(s) to the following statements (Hint: there may be more than one correct answer)  11. On a low-sodium diet, some acceptable snack items are:    A. Olives  F. Bean dip   K. Grapefruit juice    B. Salted Pretzels G. Commercial Popcorn   L. Canned peaches    C. Carrot Sticks  H. Bouillon   M. Unsalted nuts   D. [place] fries  I. Peanut butter  crackers N. Salami   E. Sweet pickles J. Tomato Juice   O. Pizza  12.  Seasonings that may be used freely on a reduced - sodium diet include   A. Lemon wedges F.Monosodium glutamate K. Celery seed    B.Soysauce   G. Pepper   L. Mustard powder   C. Sea salt  H. Cooking wine  M. Onion flakes   D. Vinegar  E. Prepared horseradish N. Salsa   E. Sage   J. Worcestershire sauce  O. Chutney

## 2020-11-25 NOTE — Progress Notes (Signed)
Cardiology Office Note    Date:  11/25/2020   ID:  Annette Sharp, Annette Sharp 1940-09-04, MRN [id number]   PCP:  Manon Hilding, MD   [place]  Cardiologist:  Carlyle Dolly, MD   Advanced Practice Provider:  No care team member to display Electrophysiologist:  None   [phone]   Chief Complaint  Patient presents with   Follow-up     History of Present Illness:  Annette Sharp is a 80 y.o. female with history of CAD status post DES to the RCA 2007, cath 2020 no significant disease, NST 01/2020 no ischemia, short runs of atrial tachycardia, hypertension, OSA does not tolerate CPAP, HLD on Crestor, prior CVA on Plavix  Patient saw Dr. Harl Bowie via telemedicine 04/17/2020 at which time she had some chest pain and shortness of breath relieved with nitroglycerin, medical therapy with long acting nitrates  Patient called into the office 11/20/2020 complaining of worsening shortness of breath since she had COVID a few weeks ago.  Was also having sharp pain under her left breast.  Patient went to [facility] yesterday after a fall and was found to have left hip subcutaneous soft tissue edema and small hematoma formation.  She had contusion of her left knee, chest wall contusion.  CT of the brain negative, and chest x-ray no rib fractures creatinine was 1.34, mildly anemic other labs stable, EKG sinus bradycardia otherwise stable per interpretation from ED.  Patient comes in accompanied by her husband. She says the fall was because she slipped in dog urine. Last week she had sharp chest pain relieved with 1 NTG but then she was short of breath for 3 days but that's improved. She is always short of breath. Short of breath with cooking dinner and has to rest.Was told she had COPD but has never smoked. Had Covid19 4 weeks ago. Was given an antiviral. Still won't get vaccinated.   Past Medical History:  Diagnosis Date   Anxiety    Back pain    Cerebrovascular disease     NONOBSTRUCTIVE CVA BY CAROTID DOPPLERS OCTOBER 2008   Chronic renal insufficiency    GFR 45 ML'S PER MINUTE   COPD (chronic obstructive pulmonary disease) (HCC)    Coronary artery disease    a. s/p DES to RCA in 2007 b. patent stent by cath in 12/2014 with nonobstructive disease along LAD and RCA   Coronary atherosclerosis of native coronary artery    NEGATIVE LEXISCAN FOR ISCHEMIA MARCH 23,2010 NORMAL LV FUNCTTION   Depression    Dyspnea    with exertion   GERD (gastroesophageal reflux disease)    Headache    chronic headache from fall in December   Hyperlipidemia    Hypertension    Hypothyroidism    Other and unspecified angina pectoris    ANGINA   Overweight(278.02)    OBESITY   Peptic ulcer disease    Primary localized osteoarthritis of left knee    Sleep apnea    SEVERE OBSTRUCTIVE    cpap   Stroke ([place])    affected memory   Thyroid disease    TREATED HYPOTHYROIDISM    Past Surgical History:  Procedure Laterality Date   ABDOMINAL HYSTERECTOMY     CARDIAC CATHETERIZATION N/A 01/22/2015   Procedure: Right/Left Heart Cath and Coronary Angiography;  Surgeon: Wellington Hampshire, MD;  Location: [facility];  Service: Cardiovascular;  Laterality: N/A;   CARPAL TUNNEL RELEASE     CHOLECYSTECTOMY  COLONOSCOPY N/A 03/31/2018   Procedure: COLONOSCOPY;  Surgeon: Rogene Houston, MD;  [facility];  Service: Endoscopy;  Laterality: N/A;  2:45   JOINT REPLACEMENT     LEFT HEART CATH AND CORONARY ANGIOGRAPHY N/A 04/28/2018   Procedure: LEFT HEART CATH AND CORONARY ANGIOGRAPHY;  Surgeon: Belva Crome, MD;  Location: [facility];  Service: Cardiovascular;  Laterality: N/A;   POLYPECTOMY  03/31/2018   Procedure: POLYPECTOMY;  Surgeon: Rogene Houston, MD;  [facility];  Service: Endoscopy;;  splen. flex, cecum   REPLACEMENT TOTAL KNEE Right 07/28/2010   ROTATOR CUFF REPAIR Right 07/08/2009   TOTAL ABDOMINAL HYSTERECTOMY W/ BILATERAL  SALPINGOOPHORECTOMY     TOTAL KNEE ARTHROPLASTY Left 04/08/2015   Procedure: LEFT TOTAL KNEE ARTHROPLASTY;  Surgeon: Elsie Saas, MD;  Location: [place];  Service: Orthopedics;  Laterality: Left;   TOTAL SHOULDER ARTHROPLASTY Left 06/02/2017   Procedure: TOTAL SHOULDER ARTHROPLASTY;  Surgeon: Hiram Gash, MD;  Location: [place];  Service: Orthopedics;  Laterality: Left;    Current Medications: Current Meds  Medication Sig   acyclovir (ZOVIRAX) 400 MG tablet Take 400 mg by mouth daily.   albuterol (PROVENTIL HFA;VENTOLIN HFA) 108 (90 Base) MCG/ACT inhaler Inhale 2 puffs into the lungs every 6 (six) hours as needed for wheezing or shortness of breath.   amLODipine (NORVASC) 10 MG tablet Take 10 mg by mouth every morning.   aspirin EC 81 MG tablet Take 1 tablet (81 mg total) by mouth at bedtime.   betamethasone dipropionate (DIPROLENE) 0.05 % cream Apply 1 application topically 2 (two) times daily as needed (atrophic vaginitis).    cetirizine (ZYRTEC) 10 MG tablet Take 10 mg by mouth daily.   Cholecalciferol (VITAMIN D3) 50 MCG (2000 UT) TABS Take 2,000 Units by mouth daily.   citalopram (CELEXA) 20 MG tablet Take 20 mg by mouth every morning.   clopidogrel (PLAVIX) 75 MG tablet Take 1 tablet (75 mg total) by mouth every morning.   hydrALAZINE (APRESOLINE) 25 MG tablet TAKE 1 TABLET BY MOUTH TWICE A DAY   hyoscyamine (LEVBID) 0.375 MG 12 hr tablet Take 1 tablet (0.375 mg total) by mouth daily.   isosorbide mononitrate (IMDUR) 60 MG 24 hr tablet Take 1.5 tablets (90 mg total) by mouth 2 (two) times daily.   levothyroxine (SYNTHROID, LEVOTHROID) 88 MCG tablet Take 88 mcg by mouth every morning.   Melatonin 5 MG SUBL Take 5 mg by mouth at bedtime.    nitroGLYCERIN (NITROSTAT) 0.4 MG SL tablet Place 1 tablet (0.4 mg total) under the tongue every 5 (five) minutes x 3 doses as needed for chest pain (if no relief after 3rd dose, proceed to the ED for an evaluation).   omeprazole (PRILOSEC) 20 MG capsule  TAKE 1 CAPSULE TWICE A DAY (Patient taking differently: Take 20 mg by mouth daily.)   rosuvastatin (CRESTOR) 20 MG tablet TAKE 1 TABLET BY MOUTH EVERY DAY   vitamin B-12 (CYANOCOBALAMIN) 1000 MCG tablet Take 1,000 mcg by mouth daily.   Wheat Dextrin (BENEFIBER) CHEW Chew 1 tablet by mouth daily.     Allergies:   Ace inhibitors, Gemfibrozil, Moxifloxacin, and Ranexa [ranolazine]   Social History   Socioeconomic History   Marital status: Married    Spouse name: Not on file   Number of children: Not on file   Years of education: Not on file   Highest education level: Not on file  Occupational History   Not on file  Tobacco Use   Smoking status: Never   Smokeless tobacco: Never  Vaping Use   Vaping Use: Never used  Substance and Sexual Activity   Alcohol use: No    Alcohol/week: 0.0 standard drinks   Drug use: No   Sexual activity: Never  Other Topics Concern   Not on file  Social History Narrative   Not on file   Social Determinants of Health   Financial Resource Strain: Not on file  Food Insecurity: Not on file  Transportation Needs: Not on file  Physical Activity: Not on file  Stress: Not on file  Social Connections: Not on file     Family History:  The patient's  family history includes CAD in her father, mother, sister, and another family member; Cancer in her mother and another family member; Diabetes in her sister; Heart attack in an other family member; Hypertension in an other family member; Leukemia in her brother.   ROS:   Please see the history of present illness.    ROS All other systems reviewed and are negative.   PHYSICAL EXAM:   VS:  BP (!) 144/58   Pulse 70   Ht '5\' 2"'$  (1.575 m)   Wt 161 lb (73 kg)   SpO2 97%   BMI 29.45 kg/m   Physical Exam  GEN: Well nourished, well developed, in no acute distress  Neck: no JVD, carotid bruits, or masses Cardiac:RRR; no murmurs, rubs, or gallops  Respiratory:  clear to auscultation bilaterally, normal work  of breathing GI: soft, nontender, nondistended, + BS Ext: without cyanosis, clubbing, or edema, Good distal pulses  Neuro:  Alert and Oriented x 3, Psych: euthymic mood, full affect  Wt Readings from Last 3 Encounters:  11/25/20 161 lb (73 kg)  04/17/20 170 lb (77.1 kg)  02/05/20 179 lb 3.2 oz (81.3 kg)      Studies/Labs Reviewed:   EKG:  EKG is not ordered today.     Recent Labs: No results found for requested labs within last 8760 hours.   Lipid Panel    Component Value Date/Time   CHOL 157 09/09/2018 0941   TRIG 60 09/09/2018 0941   HDL 61 09/09/2018 0941   CHOLHDL 2.6 09/09/2018 0941   VLDL 16 07/20/2017 1150   LDLCALC 82 09/09/2018 0941    Additional studies/ records that were reviewed today include:  Cardiac catheterization 04/28/2018:   Moderate three-vessel coronary disease, with diffuse proximal to mid calcification in all territories. Left main 25% diffuse narrowing Proximal to mid LAD 40 to 50%.  Unchanged from prior angiogram Proximal 30% circumflex.  First OM is small with irregularities. RCA contains a proximal to ostial stent that has 30% proximal narrowing.  Luminal irregularities are noted throughout the mid right coronary.  No significant obstruction is seen. Normal left ventricular function with EF greater than 50%.  LVEDP is normal.     01/2020 nuclear stress There was no ST segment deviation noted during stress. Short runs of atrial tachycardia with just mildly elevated rates around 110 The study is normal. There are no perfusion defects This is a low risk study. The left ventricular ejection fraction is hyperdynamic (>65%). Nonspecific finding of elevated TID in absence of perfusion defects    Left hip subcutaneus soft tissue edema and small hematoma formation. No suspicious lytic or blastic osseous lesions. No acute displaced fracture. No pelvic diastasis. No hip dislocation or acute displaced fracture bilaterally. Degenerative changes of the  visualized lower lumbar spine. Impression: 1.  Left hip subcutaneus soft tissue edema/hematoma formation. Negative for underlying acute osseous traumatic injury. 2. Aortic Atherosclerosis (ICD10-I70.0).  Electronically Signed By: Iven Finn M.D. On: 11/24/2020 20:00 CT Head Wo Contrast Narrative: CLINICAL DATA: Status post fall. Left-sided body pain  EXAM: CT HEAD WITHOUT CONTRAST  CT CERVICAL SPINE WITHOUT CONTRAST  TECHNIQUE: Multidetector CT imaging of the head and cervical spine was performed following the standard protocol without intravenous contrast. Multiplanar CT image reconstructions of the cervical spine were also generated.  COMPARISON: CT head 12/01/2017  FINDINGS: CT HEAD FINDINGS  Brain:  Patchy and confluent areas of decreased attenuation are noted throughout the deep and periventricular white matter of the cerebral hemispheres bilaterally, compatible with chronic microvascular ischemic disease. Chronic left cerebellar infarction. No evidence of large-territorial acute infarction. No parenchymal hemorrhage. No mass lesion. No extra-axial collection.  No mass effect or midline shift. No hydrocephalus. Basilar cisterns are patent.  Vascular: No hyperdense vessel. Atherosclerotic calcifications are present within the cavernous internal carotid arteries.  Skull: Hyperostosis frontalis interna. No acute fracture or focal lesion.  Sinuses/Orbits: Right sphenoid sinus mucosal thickening. Otherwise the remaining visualized paranasal sinuses and mastoid air cells are clear. The orbits are unremarkable.  Other: None.  CT CERVICAL SPINE FINDINGS  Alignment: Normal.  Skull base and vertebrae: Multilevel degenerative changes of the spine most prominent at the C5-C6 level. No acute fracture. No aggressive appearing focal osseous lesion or focal pathologic process.  Soft tissues and spinal canal: No prevertebral fluid or swelling. No visible canal  hematoma.  Upper chest: Unremarkable.  Other: None. Impression: 1. No acute intracranial abnormality. 2. No acute displaced fracture or traumatic listhesis of the cervical spine.  Electronically Signed By: Iven Finn M.D. On: 11/24/2020 19:55 CT Cervical Spine Wo Contrast Narrative: CLINICAL DATA: Status post fall. Left-sided body pain  EXAM: CT HEAD WITHOUT CONTRAST  CT CERVICAL SPINE WITHOUT CONTRAST  TECHNIQUE: Multidetector CT imaging of the head and cervical spine was performed following the standard protocol without intravenous contrast. Multiplanar CT image reconstructions of the cervical spine were also generated.  COMPARISON: CT head 12/01/2017  FINDINGS: CT HEAD FINDINGS  Brain:  Patchy and confluent areas of decreased attenuation are noted throughout the deep and periventricular white matter of the cerebral hemispheres bilaterally, compatible with chronic microvascular ischemic disease. Chronic left cerebellar infarction. No evidence of large-territorial acute infarction. No parenchymal hemorrhage. No mass lesion. No extra-axial collection.  No mass effect or midline shift. No hydrocephalus. Basilar cisterns are patent.  Vascular: No hyperdense vessel. Atherosclerotic calcifications are present within the cavernous internal carotid arteries.  Skull: Hyperostosis frontalis interna. No acute fracture or focal lesion.  Sinuses/Orbits: Right sphenoid sinus mucosal thickening. Otherwise the remaining visualized paranasal sinuses and mastoid air cells are clear. The orbits are unremarkable.  Other: None.  CT CERVICAL SPINE FINDINGS  Alignment: Normal.  Skull base and vertebrae: Multilevel degenerative changes of the spine most prominent at the C5-C6 level. No acute fracture. No aggressive appearing focal osseous lesion or focal pathologic process.  Soft tissues and spinal canal: No prevertebral fluid or swelling. No visible canal  hematoma.  Upper chest: Unremarkable.  Other: None. Impression: 1. No acute intracranial abnormality. 2. No acute displaced fracture or traumatic listhesis of the cervical spine.  Electronically Signed By: Iven Finn M.D. On: 11/24/2020 19:55    Risk Assessment/Calculations:         ASSESSMENT:    1. Coronary artery disease involving native coronary artery of native heart without angina pectoris  2. Shortness of breath   3. Essential hypertension   4. Mixed hyperlipidemia      PLAN:  In order of problems listed above:  CAD status post DES to the RCA 2007 last cath 03/2018 no significant change, NST 01/2020 no ischemia short runs of atrial tachycardia, continues to have angina releived with NTG. On Imdur, amlodipine, plavix, ASA, Crestor  Shortness of breath since having COVID  4 weeks ago (also had in February and still won't get vaccine) CXR and EKG in ER yest stable. No evidence of CHF on exam. Does eat out a lot. Will check echo for LV function and keep f/u with Dr. Harl Bowie in Oct.  Hypertension-up a little. 2 gm sodium diet  Hyperlipidemia on crestor  Shared Decision Making/Informed Consent        Medication Adjustments/Labs and Tests Ordered: Current medicines are reviewed at length with the patient today.  Concerns regarding medicines are outlined above.  Medication changes, Labs and Tests ordered today are listed in the Patient Instructions below. Patient Instructions  Medication Instructions:  Your physician recommends that you continue on your current medications as directed. Please refer to the Current Medication list given to you today.  *If you need a refill on your cardiac medications before your next appointment, please call your pharmacy*   Lab Work: None If you have labs (blood work) drawn today and your tests are completely normal, you will receive your results only by: [place] (if you have MyChart) OR A paper copy in the mail If  you have any lab test that is abnormal or we need to change your treatment, we will call you to review the results.   Testing/Procedures: Your physician recommends that you continue on your current medications as directed. Please refer to the Current Medication list given to you today.    Follow-Up: At [facility], you and your health needs are our priority.  As part of our continuing mission to provide you with exceptional heart care, we have created designated Provider Care Teams.  These Care Teams include your primary Cardiologist (physician) and Advanced Practice Providers (APPs -  Physician Assistants and Nurse Practitioners) who all work together to provide you with the care you need, when you need it.  We recommend signing up for the patient portal called "MyChart".  Sign up information is provided on this After Visit Summary.  MyChart is used to connect with patients for Virtual Visits (Telemedicine).  Patients are able to view lab/test results, encounter notes, upcoming appointments, etc.  Non-urgent messages can be sent to your provider as well.   To learn more about what you can do with MyChart, go to [email or web address].    Your next appointment:   Keep your scheduled follow up appointment with Dr. Harl Bowie in October.    Other Instructions    Two Gram Sodium Diet 2000 mg  What is Sodium? Sodium is a mineral found naturally in many foods. The most significant source of sodium in the diet is table salt, which is about 40% sodium.  Processed, convenience, and preserved foods also contain a large amount of sodium.  The body needs only 500 mg of sodium daily to function,  A normal diet provides more than enough sodium even if you do not use salt.  Why Limit Sodium? A build up of sodium in the body can cause thirst, increased blood pressure, shortness of breath, and water retention.  Decreasing sodium in the diet can reduce edema and risk of  heart attack or stroke associated  with high blood pressure.  Keep in mind that there are many other factors involved in these health problems.  Heredity, obesity, lack of exercise, cigarette smoking, stress and what you eat all play a role.  General Guidelines: Do not add salt at the table or in cooking.  One teaspoon of salt contains over 2 grams of sodium. Read food labels Avoid processed and convenience foods Ask your dietitian before eating any foods not dicussed in the menu planning guidelines Consult your physician if you wish to use a salt substitute or a sodium containing medication such as antacids.  Limit milk and milk products to 16 oz (2 cups) per day.  Shopping Hints: READ LABELS!! "Dietetic" does not necessarily mean low sodium. Salt and other sodium ingredients are often added to foods during processing.    Menu Planning Guidelines Food Group Choose More Often Avoid  Beverages (see also the milk group All fruit juices, low-sodium, salt-free vegetables juices, low-sodium carbonated beverages Regular vegetable or tomato juices, commercially softened water used for drinking or cooking  Breads and Cereals Enriched white, wheat, rye and pumpernickel bread, hard rolls and dinner rolls; muffins, cornbread and waffles; most dry cereals, cooked cereal without added salt; unsalted crackers and breadsticks; low sodium or homemade bread crumbs Bread, rolls and crackers with salted tops; quick breads; instant hot cereals; pancakes; commercial bread stuffing; self-rising flower and biscuit mixes; regular bread crumbs or cracker crumbs  Desserts and Sweets Desserts and sweets mad with mild should be within allowance Instant pudding mixes and cake mixes  Fats Butter or margarine; vegetable oils; unsalted salad dressings, regular salad dressings limited to 1 Tbs; light, sour and heavy cream Regular salad dressings containing bacon fat, bacon bits, and salt pork; snack dips made with instant soup mixes or processed cheese; salted  nuts  Fruits Most fresh, frozen and canned fruits Fruits processed with salt or sodium-containing ingredient (some dried fruits are processed with sodium sulfites        Vegetables Fresh, frozen vegetables and low- sodium canned vegetables Regular canned vegetables, sauerkraut, pickled vegetables, and others prepared in brine; frozen vegetables in sauces; vegetables seasoned with ham, bacon or salt pork  Condiments, Sauces, Miscellaneous  Salt substitute with physician's approval; pepper, herbs, spices; vinegar, lemon or lime juice; hot pepper sauce; garlic powder, onion powder, low sodium soy sauce (1 Tbs.); low sodium condiments (ketchup, chili sauce, mustard) in limited amounts (1 tsp.) fresh ground horseradish; unsalted tortilla chips, pretzels, potato chips, popcorn, salsa (1/4 cup) Any seasoning made with salt including garlic salt, celery salt, onion salt, and seasoned salt; sea salt, rock salt, kosher salt; meat tenderizers; monosodium glutamate; mustard, regular soy sauce, barbecue, sauce, chili sauce, teriyaki sauce, steak sauce, Worcestershire sauce, and most flavored vinegars; canned gravy and mixes; regular condiments; salted snack foods, olives, picles, relish, horseradish sauce, catsup   Food preparation: Try these seasonings Meats:    Pork Sage, onion Serve with applesauce  Chicken Poultry seasoning, thyme, parsley Serve with cranberry sauce  Lamb Curry powder, rosemary, garlic, thyme Serve with mint sauce or jelly  Veal Marjoram, basil Serve with current jelly, cranberry sauce  Beef Pepper, bay leaf Serve with dry mustard, unsalted chive butter  Fish [place] leaf, dill Serve with unsalted lemon butter, unsalted parsley butter  Vegetables:    Asparagus Lemon juice   Broccoli Lemon juice   Carrots Mustard dressing parsley, mint, nutmeg, glazed with unsalted butter and sugar   Green beans Marjoram,  lemon juice, nutmeg,dill seed   Tomatoes Basil, marjoram, onion   Spice /blend for  [facility]" 4 tsp ground thyme 1 tsp ground sage 3 tsp ground rosemary 4 tsp ground marjoram   Test your knowledge A product that says "Salt Free" may still contain sodium. True or False Garlic Powder and Hot Pepper Sauce an be used as alternative seasonings.True or False Processed foods have more sodium than fresh foods.  True or False Canned Vegetables have less sodium than froze True or False   WAYS TO DECREASE YOUR SODIUM INTAKE Avoid the use of added salt in cooking and at the table.  Table salt (and other prepared seasonings which contain salt) is probably one of the greatest sources of sodium in the diet.  Unsalted foods can gain flavor from the sweet, sour, and butter taste sensations of herbs and spices.  Instead of using salt for seasoning, try the following seasonings with the foods listed.  Remember: how you use them to enhance natural food flavors is limited only by your creativity... Allspice-Meat, fish, eggs, fruit, peas, red and yellow vegetables Almond Extract-Fruit baked goods Anise Seed-Sweet breads, fruit, carrots, beets, cottage cheese, cookies (tastes like licorice) Basil-Meat, fish, eggs, vegetables, rice, vegetables salads, soups, sauces Bay Leaf-Meat, fish, stews, poultry Burnet-Salad, vegetables (cucumber-like flavor) Caraway Seed-Bread, cookies, cottage cheese, meat, vegetables, cheese, rice Cardamon-Baked goods, fruit, soups Celery Powder or seed-Salads, salad dressings, sauces, meatloaf, soup, bread.Do not use  celery salt Chervil-Meats, salads, fish, eggs, vegetables, cottage cheese (parsley-like flavor) Chili Power-Meatloaf, chicken cheese, corn, eggplant, egg dishes Chives-Salads cottage cheese, egg dishes, soups, vegetables, sauces Cilantro-Salsa, casseroles Cinnamon-Baked goods, fruit, pork, lamb, chicken, carrots Cloves-Fruit, baked goods, fish, pot roast, green beans, beets, carrots Coriander-Pastry, cookies, meat, salads, cheese (lemon-orange  flavor) Cumin-Meatloaf, fish,cheese, eggs, cabbage,fruit pie (caraway flavor) [employer], fruit, eggs, fish, poultry, cottage cheese, vegetables Dill Seed-Meat, cottage cheese, poultry, vegetables, fish, salads, bread Fennel Seed-Bread, cookies, apples, pork, eggs, fish, beets, cabbage, cheese, Licorice-like flavor Garlic-(buds or powder) Salads, meat, poultry, fish, bread, butter, vegetables, potatoes.Do not  use garlic salt Ginger-Fruit, vegetables, baked goods, meat, fish, poultry Horseradish Root-Meet, vegetables, butter Lemon Juice or Extract-Vegetables, fruit, tea, baked goods, fish salads Mace-Baked goods fruit, vegetables, fish, poultry (taste like nutmeg) Maple Extract-Syrups Marjoram-Meat, chicken, fish, vegetables, breads, green salads (taste like Sage) Mint-Tea, lamb, sherbet, vegetables, desserts, carrots, cabbage Mustard, Dry or Seed-Cheese, eggs, meats, vegetables, poultry Nutmeg-Baked goods, fruit, chicken, eggs, vegetables, desserts Onion Powder-Meat, fish, poultry, vegetables, cheese, eggs, bread, rice salads (Do not use   Onion salt) Orange Extract-Desserts, baked goods Oregano-Pasta, eggs, cheese, onions, pork, lamb, fish, chicken, vegetables, green salads Paprika-Meat, fish, poultry, eggs, cheese, vegetables Parsley Flakes-Butter, vegetables, meat fish, poultry, eggs, bread, salads (certain forms may   Contain sodium Pepper-Meat fish, poultry, vegetables, eggs Peppermint Extract-Desserts, baked goods Poppy Seed-Eggs, bread, cheese, fruit dressings, baked goods, noodles, vegetables, cottage  [employer], poultry, meat, fish, cauliflower, turnips,eggs bread Saffron-Rice, bread, veal, chicken, fish, eggs Sage-Meat, fish, poultry, onions, eggplant, tomateos, pork, stews Savory-Eggs, salads, poultry, meat, rice, vegetables, soups, pork Tarragon-Meat, poultry, fish, eggs, butter, vegetables (licorice-like  flavor)  Thyme-Meat, poultry, fish, eggs, vegetables, (clover-like flavor), sauces, soups Tumeric-Salads, butter, eggs, fish, rice, vegetables (saffron-like flavor) Vanilla Extract-Baked goods, candy Vinegar-Salads, vegetables, meat marinades Walnut Extract-baked goods, candy   2. Choose your Foods Wisely   The following is a list of foods to avoid which are high in sodium:  Meats-Avoid all smoked, canned, salt cured, dried and kosher meat  and fish as well as Anchovies   [employer] meats:Bologna, Liverwurst, Pastrami Canned meat or fish  Marinated herring Caviar    Pepperoni Corned Beef   Pizza Dried chipped beef  Salami Frozen breaded fish or meat Salt pork Frankfurters or hot dogs  Sardines Gefilte fish   Sausage Ham (boiled ham, Proscuitto Smoked butt    spiced ham)   Spam      TV Dinners Vegetables Canned vegetables (Regular) Relish Canned mushrooms  Sauerkraut Olives    Tomato juice Pickles  Bakery and Dessert Products Canned puddings  Cream pies Cheesecake   Decorated cakes Cookies  Beverages/Juices Tomato juice, regular  Gatorade   V-8 vegetable juice, regular  Breads and Cereals Biscuit mixes   Salted potato chips, corn chips, pretzels Bread stuffing mixes  Salted crackers and rolls Pancake and waffle mixes Self-rising flour  Seasonings Accent    Meat sauces Barbecue sauce  Meat tenderizer Catsup    Monosodium glutamate (MSG) Celery salt   Onion salt Chili sauce   Prepared mustard Garlic salt   Salt, seasoned salt, sea salt Gravy mixes   Soy sauce Horseradish   Steak sauce Ketchup   Tartar sauce Lite salt    Teriyaki sauce Marinade mixes   Worcestershire sauce  Others Baking powder   Cocoa and cocoa mixes Baking soda   Commercial casserole mixes Candy-caramels, chocolate  Dehydrated soups    Bars, fudge,nougats  Instant rice and pasta mixes Canned broth or soup  Maraschino cherries Cheese, aged and processed cheese and cheese  spreads  Learning Assessment Quiz  Indicated T (for True) or F (for False) for each of the following statements:  _____ Fresh fruits and vegetables and unprocessed grains are generally low in sodium _____ Water may contain a considerable amount of sodium, depending on the source _____ You can always tell if a food is high in sodium by tasting it _____ Certain laxatives my be high in sodium and should be avoided unless prescribed   by a physician or pharmacist _____ Salt substitutes may be used freely by anyone on a sodium restricted diet _____ Sodium is present in table salt, food additives and as a natural component of   most foods _____ Table salt is approximately 90% sodium _____ Limiting sodium intake may help prevent excess fluid accumulation in the body _____ On a sodium-restricted diet, seasonings such as bouillon soy sauce, and    cooking wine should be used in place of table salt _____ On an ingredient list, a product which lists monosodium glutamate as the first   ingredient is an appropriate food to include on a low sodium diet  Circle the best answer(s) to the following statements (Hint: there may be more than one correct answer)  11. On a low-sodium diet, some acceptable snack items are:    A. Olives  F. Bean dip   K. Grapefruit juice    B. Salted Pretzels G. Commercial Popcorn   L. Canned peaches    C. Carrot Sticks  H. Bouillon   M. Unsalted nuts   D. [place] fries  I. Peanut butter crackers N. Salami   E. Sweet pickles J. Tomato Juice   O. Pizza  12.  Seasonings that may be used freely on a reduced - sodium diet include   A. Lemon wedges F.Monosodium glutamate K. Celery seed    B.Soysauce   G. Pepper   L. Mustard powder   C. Sea salt  H. Cooking  wine  M. Onion flakes   D. Vinegar  E. Prepared horseradish N. Salsa   E. Sage   J. Worcestershire sauce  O. [address]    Sumner Boast, PA-C  11/25/2020 2:34 PM    [facility] [address], [place], [place]  [address] Phone: [phone]; Fax: [phone]

## 2020-12-25 ENCOUNTER — Ambulatory Visit (HOSPITAL_COMMUNITY)
Admission: RE | Admit: 2020-12-25 | Discharge: 2020-12-25 | Disposition: A | Discharge status: Home / Self Care | Payer: Medicare Other | Source: Ambulatory Visit | Attending: Physician Assistant | Admitting: Physician Assistant

## 2020-12-25 ENCOUNTER — Telehealth: Payer: Self-pay

## 2020-12-25 ENCOUNTER — Other Ambulatory Visit: Payer: Self-pay

## 2020-12-25 DIAGNOSIS — R0602 Shortness of breath: Secondary | ICD-10-CM | POA: Insufficient documentation

## 2020-12-25 DIAGNOSIS — Z85828 Personal history of other malignant neoplasm of skin: Secondary | ICD-10-CM | POA: Diagnosis not present

## 2020-12-25 DIAGNOSIS — Z85820 Personal history of malignant melanoma of skin: Secondary | ICD-10-CM | POA: Diagnosis not present

## 2020-12-25 DIAGNOSIS — L570 Actinic keratosis: Secondary | ICD-10-CM | POA: Diagnosis not present

## 2020-12-25 DIAGNOSIS — Z1283 Encounter for screening for malignant neoplasm of skin: Secondary | ICD-10-CM | POA: Diagnosis not present

## 2020-12-25 LAB — ECHOCARDIOGRAM COMPLETE
Area-P 1/2: 2.91 cm2
P 1/2 time: 465 msec
S' Lateral: 2 cm

## 2020-12-25 NOTE — Telephone Encounter (Signed)
-----   Message from Annette Sharp, [place] sent at 12/25/2020  3:51 PM EDT ----- Heart function normal, heart has trouble relaxing-need to reduce salt in diet and eating out. Mild leaking heart valves that we will watch. Keep f/u with Dr. Harl Bowie

## 2020-12-25 NOTE — Progress Notes (Signed)
*  PRELIMINARY RESULTS* Echocardiogram 2D Echocardiogram has been performed.  Samuel Germany 12/25/2020, 1:55 PM

## 2020-12-25 NOTE — Telephone Encounter (Signed)
Pt notified and verbalized understanding. Pt had no questions or concerns at this time. PCP copied

## 2021-01-01 DIAGNOSIS — H43393 Other vitreous opacities, bilateral: Secondary | ICD-10-CM | POA: Diagnosis not present

## 2021-01-01 DIAGNOSIS — H04123 Dry eye syndrome of bilateral lacrimal glands: Secondary | ICD-10-CM | POA: Diagnosis not present

## 2021-01-03 DIAGNOSIS — Z23 Encounter for immunization: Secondary | ICD-10-CM | POA: Diagnosis not present

## 2021-01-08 DIAGNOSIS — R0781 Pleurodynia: Secondary | ICD-10-CM | POA: Diagnosis not present

## 2021-01-08 DIAGNOSIS — J9811 Atelectasis: Secondary | ICD-10-CM | POA: Diagnosis not present

## 2021-01-08 DIAGNOSIS — J449 Chronic obstructive pulmonary disease, unspecified: Secondary | ICD-10-CM | POA: Diagnosis not present

## 2021-01-08 DIAGNOSIS — R911 Solitary pulmonary nodule: Secondary | ICD-10-CM | POA: Diagnosis not present

## 2021-01-08 DIAGNOSIS — I700 Atherosclerosis of aorta: Secondary | ICD-10-CM | POA: Diagnosis not present

## 2021-01-08 DIAGNOSIS — I2510 Atherosclerotic heart disease of native coronary artery without angina pectoris: Secondary | ICD-10-CM | POA: Diagnosis not present

## 2021-01-08 DIAGNOSIS — I10 Essential (primary) hypertension: Secondary | ICD-10-CM | POA: Diagnosis not present

## 2021-01-08 DIAGNOSIS — I498 Other specified cardiac arrhythmias: Secondary | ICD-10-CM | POA: Diagnosis not present

## 2021-01-08 DIAGNOSIS — Z9049 Acquired absence of other specified parts of digestive tract: Secondary | ICD-10-CM | POA: Diagnosis not present

## 2021-01-08 DIAGNOSIS — R079 Chest pain, unspecified: Secondary | ICD-10-CM | POA: Diagnosis not present

## 2021-01-13 ENCOUNTER — Other Ambulatory Visit: Payer: Self-pay

## 2021-01-13 ENCOUNTER — Encounter: Payer: Self-pay | Admitting: Cardiology

## 2021-01-13 ENCOUNTER — Ambulatory Visit (INDEPENDENT_AMBULATORY_CARE_PROVIDER_SITE_OTHER): Payer: Medicare Other | Admitting: Cardiology

## 2021-01-13 VITALS — BP 120/50 | HR 63 | Ht 62.0 in | Wt 158.8 lb

## 2021-01-13 DIAGNOSIS — I10 Essential (primary) hypertension: Secondary | ICD-10-CM

## 2021-01-13 DIAGNOSIS — E782 Mixed hyperlipidemia: Secondary | ICD-10-CM | POA: Diagnosis not present

## 2021-01-13 DIAGNOSIS — I2510 Atherosclerotic heart disease of native coronary artery without angina pectoris: Secondary | ICD-10-CM

## 2021-01-13 DIAGNOSIS — R0602 Shortness of breath: Secondary | ICD-10-CM | POA: Diagnosis not present

## 2021-01-13 NOTE — Patient Instructions (Signed)
Medication Instructions:   Your physician recommends that you continue on your current medications as directed. Please refer to the Current Medication list given to you today.  *If you need a refill on your cardiac medications before your next appointment, please call your pharmacy*   Lab Work: None today  If you have labs (blood work) drawn today and your tests are completely normal, you will receive your results only by: [place] (if you have MyChart) OR A paper copy in the mail If you have any lab test that is abnormal or we need to change your treatment, we will call you to review the results.   Testing/Procedures: Your physician has recommended that you have a pulmonary function test. Pulmonary Function Tests are a group of tests that measure how well air moves in and out of your lungs. We have placed a referral to pulmonary. They will call you to schedule appointment.    Follow-Up: At [facility], you and your health needs are our priority.  As part of our continuing mission to provide you with exceptional heart care, we have created designated Provider Care Teams.  These Care Teams include your primary Cardiologist (physician) and Advanced Practice Providers (APPs -  Physician Assistants and Nurse Practitioners) who all work together to provide you with the care you need, when you need it.  We recommend signing up for the patient portal called "MyChart".  Sign up information is provided on this After Visit Summary.  MyChart is used to connect with patients for Virtual Visits (Telemedicine).  Patients are able to view lab/test results, encounter notes, upcoming appointments, etc.  Non-urgent messages can be sent to your provider as well.   To learn more about what you can do with MyChart, go to [email or web address].    Your next appointment:   6 month(s)  The format for your next appointment:   In Person  Provider:   Carlyle Dolly, MD   Other  Instructions None

## 2021-01-13 NOTE — Progress Notes (Signed)
Clinical Summary Annette Sharp is a 80 y.o.female seen today for follow up of the following medical problems.    1. CAD - Prior DES to RCA in 2007.  - cath Jan 2020 without significant disease   - at prior visit reprotedchest pain. Pulling like pain in midchest into right shoulder, has had prior shoulder replacements - usually occurs with activity.Not positional. Lasts about 10 minutes. Some SOB, feels dizzy/lightheaded. - highest level of activity is house work, or walking to [employer].  - better with with SL NG   01/2020 nuclear stress: no ischemia, short runs of atach.   11/2020 echo: LVEF 65-70%, grade II dd, normal RV, mid MR, mild AI   12/2020 ER visit [facility] with chest pain - from notes described right sided pleuritic chest pain -  Trop neg x 2. CXR no acute process. CTA chest no PE, atelectasis.  - no other chest pains. Chronic SOB unchanged, chronic fatigue.   2. HTN -  visit 10/2019 with PA Leonides Sake hydralazine 25mg  bid added - she is compliant with meds     3. OSA - not able to tolerate cpap     4. Hyperlipidemia - 08/2018 TC 157 HDL 61 TG LD 82  - labs followed by pcp - compliant with crestor 20mg  daily   5. History of prior CVA - has been on plavix.     Past Medical History:  Diagnosis Date   Anxiety    Back pain    Cerebrovascular disease    NONOBSTRUCTIVE CVA BY CAROTID DOPPLERS OCTOBER 2008   Chronic renal insufficiency    GFR 19 ML'S PER MINUTE   COPD (chronic obstructive pulmonary disease) (HCC)    Coronary artery disease    a. s/p DES to RCA in 2007 b. patent stent by cath in 12/2014 with nonobstructive disease along LAD and RCA   Coronary atherosclerosis of native coronary artery    NEGATIVE LEXISCAN FOR ISCHEMIA MARCH 23,2010 NORMAL LV FUNCTTION   Depression    Dyspnea    with exertion   GERD (gastroesophageal reflux disease)    Headache    chronic headache from fall in December   Hyperlipidemia    Hypertension    Hypothyroidism     Other and unspecified angina pectoris    ANGINA   Overweight(278.02)    OBESITY   Peptic ulcer disease    Primary localized osteoarthritis of left knee    Sleep apnea    SEVERE OBSTRUCTIVE    cpap   Stroke ([place])    affected memory   Thyroid disease    TREATED HYPOTHYROIDISM     Allergies  Allergen Reactions   Ace Inhibitors Cough   Gemfibrozil Other (See Comments)    Muscle Aches   Moxifloxacin Rash   Ranexa [Ranolazine] Other (See Comments)    "NIGHTMARES"     Current Outpatient Medications  Medication Sig Dispense Refill   acyclovir (ZOVIRAX) 400 MG tablet Take 400 mg by mouth daily.     albuterol (PROVENTIL HFA;VENTOLIN HFA) 108 (90 Base) MCG/ACT inhaler Inhale 2 puffs into the lungs every 6 (six) hours as needed for wheezing or shortness of breath. 1 Inhaler 2   amLODipine (NORVASC) 10 MG tablet Take 10 mg by mouth every morning.  3   aspirin EC 81 MG tablet Take 1 tablet (81 mg total) by mouth at bedtime.     betamethasone dipropionate (DIPROLENE) 0.05 % cream Apply 1 application topically 2 (two) times daily as  needed (atrophic vaginitis).      cetirizine (ZYRTEC) 10 MG tablet Take 10 mg by mouth daily.     Cholecalciferol (VITAMIN D3) 50 MCG (2000 UT) TABS Take 2,000 Units by mouth daily.     citalopram (CELEXA) 20 MG tablet Take 20 mg by mouth every morning.     clopidogrel (PLAVIX) 75 MG tablet Take 1 tablet (75 mg total) by mouth every morning.     hydrALAZINE (APRESOLINE) 25 MG tablet TAKE 1 TABLET BY MOUTH TWICE A DAY 180 tablet 3   hyoscyamine (LEVBID) 0.375 MG 12 hr tablet Take 1 tablet (0.375 mg total) by mouth daily. 30 tablet 11   isosorbide mononitrate (IMDUR) 60 MG 24 hr tablet Take 1.5 tablets (90 mg total) by mouth 2 (two) times daily. 270 tablet 3   levothyroxine (SYNTHROID, LEVOTHROID) 88 MCG tablet Take 88 mcg by mouth every morning.     Melatonin 5 MG SUBL Take 5 mg by mouth at bedtime.      nitroGLYCERIN (NITROSTAT) 0.4 MG SL tablet Place 1  tablet (0.4 mg total) under the tongue every 5 (five) minutes x 3 doses as needed for chest pain (if no relief after 3rd dose, proceed to the ED for an evaluation). 25 tablet 3   omeprazole (PRILOSEC) 20 MG capsule TAKE 1 CAPSULE TWICE A DAY (Patient taking differently: Take 20 mg by mouth daily.) 180 capsule 1   rosuvastatin (CRESTOR) 20 MG tablet TAKE 1 TABLET BY MOUTH EVERY DAY 90 tablet 1   vitamin B-12 (CYANOCOBALAMIN) 1000 MCG tablet Take 1,000 mcg by mouth daily.     Wheat Dextrin (BENEFIBER) CHEW Chew 1 tablet by mouth daily.     No current facility-administered medications for this visit.     Past Surgical History:  Procedure Laterality Date   ABDOMINAL HYSTERECTOMY     CARDIAC CATHETERIZATION N/A 01/22/2015   Procedure: Right/Left Heart Cath and Coronary Angiography;  Surgeon: Wellington Hampshire, MD;  Location: [place] [facility];  Service: Cardiovascular;  Laterality: N/A;   CARPAL TUNNEL RELEASE     CHOLECYSTECTOMY     COLONOSCOPY N/A 03/31/2018   Procedure: COLONOSCOPY;  Surgeon: Rogene Houston, MD;  [facility];  Service: Endoscopy;  Laterality: N/A;  2:45   JOINT REPLACEMENT     LEFT HEART CATH AND CORONARY ANGIOGRAPHY N/A 04/28/2018   Procedure: LEFT HEART CATH AND CORONARY ANGIOGRAPHY;  Surgeon: Belva Crome, MD;  Location: [place] [facility];  Service: Cardiovascular;  Laterality: N/A;   POLYPECTOMY  03/31/2018   Procedure: POLYPECTOMY;  Surgeon: Rogene Houston, MD;  [facility];  Service: Endoscopy;;  splen. flex, cecum   REPLACEMENT TOTAL KNEE Right 07/28/2010   ROTATOR CUFF REPAIR Right 07/08/2009   TOTAL ABDOMINAL HYSTERECTOMY W/ BILATERAL SALPINGOOPHORECTOMY     TOTAL KNEE ARTHROPLASTY Left 04/08/2015   Procedure: LEFT TOTAL KNEE ARTHROPLASTY;  Surgeon: Elsie Saas, MD;  Location: [place];  Service: Orthopedics;  Laterality: Left;   TOTAL SHOULDER ARTHROPLASTY Left 06/02/2017   Procedure: TOTAL SHOULDER ARTHROPLASTY;  Surgeon: Hiram Gash, MD;   Location: [place];  Service: Orthopedics;  Laterality: Left;     Allergies  Allergen Reactions   Ace Inhibitors Cough   Gemfibrozil Other (See Comments)    Muscle Aches   Moxifloxacin Rash   Ranexa [Ranolazine] Other (See Comments)    "NIGHTMARES"      Family History  Problem Relation Age of Onset   CAD Other  Hypertension Other    Heart attack Other    Cancer Other    CAD Mother    Cancer Mother    CAD Father    Diabetes Sister    CAD Sister    Leukemia Brother      Social History Ms. Dunlevy reports that she has never smoked. She has never used smokeless tobacco. Ms. Cavenaugh reports no history of alcohol use.   Review of Systems CONSTITUTIONAL: No weight loss, fever, chills, weakness or fatigue.  HEENT: Eyes: No visual loss, blurred vision, double vision or yellow sclerae.No hearing loss, sneezing, congestion, runny nose or sore throat.  SKIN: No rash or itching.  CARDIOVASCULAR: per hpi RESPIRATORY: per hpi GASTROINTESTINAL: No anorexia, nausea, vomiting or diarrhea. No abdominal pain or blood.  GENITOURINARY: No burning on urination, no polyuria NEUROLOGICAL: No headache, dizziness, syncope, paralysis, ataxia, numbness or tingling in the extremities. No change in bowel or bladder control.  MUSCULOSKELETAL: No muscle, back pain, joint pain or stiffness.  LYMPHATICS: No enlarged nodes. No history of splenectomy.  PSYCHIATRIC: No history of depression or anxiety.  ENDOCRINOLOGIC: No reports of sweating, cold or heat intolerance. No polyuria or polydipsia.  Marland Kitchen   Physical Examination Today's Vitals   01/13/21 1302  BP: (!) 120/50  Pulse: 63  SpO2: 96%  Weight: 158 lb 12.8 oz (72 kg)  Height: 5\' 2"  (1.575 m)   Body mass index is 29.04 kg/m.  Gen: resting comfortably, no acute distress HEENT: no scleral icterus, pupils equal round and reactive, no palptable cervical adenopathy,  CV: RRR, no m/r/g no jvd Resp: Clear to auscultation bilaterally GI: abdomen is  soft, non-tender, non-distended, normal bowel sounds, no hepatosplenomegaly MSK: extremities are warm, no edema.  Skin: warm, no rash Neuro:  no focal deficits Psych: appropriate affect   Diagnostic Studies  Cardiac catheterization May 23, 2018:   Moderate three-vessel coronary disease, with diffuse proximal to mid calcification in all territories. Left main 25% diffuse narrowing Proximal to mid LAD 40 to 50%.  Unchanged from prior angiogram Proximal 30% circumflex.  First OM is small with irregularities. RCA contains a proximal to ostial stent that has 30% proximal narrowing.  Luminal irregularities are noted throughout the mid right coronary.  No significant obstruction is seen. Normal left ventricular function with EF greater than 50%.  LVEDP is normal.     01/2020 nuclear stress There was no ST segment deviation noted during stress. Short runs of atrial tachycardia with just mildly elevated rates around 110 The study is normal. There are no perfusion defects This is a low risk study. The left ventricular ejection fraction is hyperdynamic (>65%). Nonspecific finding of elevated TID in absence of perfusion defects  11/2020 echo IMPRESSIONS     1. Left ventricular ejection fraction, by estimation, is 65 to 70%. The  left ventricle has normal function. The left ventricle has no regional  wall motion abnormalities. There is mild left ventricular hypertrophy.  Left ventricular diastolic parameters  are consistent with Grade II diastolic dysfunction (pseudonormalization).  Elevated left ventricular end-diastolic pressure.   2. Right ventricular systolic function is normal. The right ventricular  size is normal. There is normal pulmonary artery systolic pressure. The  estimated right ventricular systolic pressure is 93.7 mmHg.   3. Left atrial size was mildly dilated.   4. There is a trivial pericardial effusion posterior to the left  ventricle.   5. The mitral valve is abnormal.  Mild mitral valve regurgitation.   6. The aortic valve is  tricuspid. There is mild calcification of the  aortic valve. Aortic valve regurgitation is mild. Mild to moderate aortic  valve sclerosis/calcification is present, without any evidence of aortic  stenosis. Aortic regurgitation PHT  measures 465 msec.   7. The inferior vena cava is normal in size with greater than 50%  respiratory variability, suggesting right atrial pressure of 3 mmHg.   Assessment and Plan  1. CAD - no recent cardiac chest pains - ongoing SOB/DOE is out of proportion to cardiac status, repeat PFTs   2. HTN -at goal, continue current meds   3. Hyperlipidemia -request labs from pcp, conitnue crestor.       Arnoldo Lenis, M.D.

## 2021-01-14 ENCOUNTER — Other Ambulatory Visit: Payer: Self-pay | Admitting: *Deleted

## 2021-01-14 DIAGNOSIS — R0609 Other forms of dyspnea: Secondary | ICD-10-CM

## 2021-01-14 DIAGNOSIS — R0602 Shortness of breath: Secondary | ICD-10-CM

## 2021-01-21 ENCOUNTER — Telehealth: Payer: Self-pay | Admitting: Cardiology

## 2021-01-21 NOTE — Telephone Encounter (Signed)
Checking percert on the following patient for testing scheduled at [facility]    PFT'S  02-11-2021

## 2021-01-21 NOTE — Telephone Encounter (Signed)
Patient's husband states he is returning a call to [facility] regarding her COVID screening/labwork and breathing test with Forestine Na. I informed patient's husband of date and time. However, I don't see any additional documentation regarding this procedure and I'm unsure what the initial call was for. Informed patient's husband that I would have her call back as soon as she is available to further discuss.

## 2021-01-30 ENCOUNTER — Telehealth: Payer: Self-pay | Admitting: Cardiology

## 2021-02-07 ENCOUNTER — Other Ambulatory Visit (HOSPITAL_COMMUNITY): Payer: Medicare Other

## 2021-02-11 ENCOUNTER — Telehealth (INDEPENDENT_AMBULATORY_CARE_PROVIDER_SITE_OTHER): Payer: Self-pay | Admitting: *Deleted

## 2021-02-11 ENCOUNTER — Encounter (HOSPITAL_COMMUNITY): Payer: Medicare Other

## 2021-02-11 NOTE — Telephone Encounter (Signed)
Fax from [facility] requesting refill on hyoscyamine er 0.375mg . per dr Laural Golden call pt and tell her to take one half tablet and ask if she is having any side effects. Can have #30 with 5 refills.  I called and left pt a message to return call.

## 2021-02-12 NOTE — Telephone Encounter (Signed)
Left message to return call 

## 2021-02-14 NOTE — Telephone Encounter (Signed)
Left message to return call 

## 2021-02-14 NOTE — Telephone Encounter (Signed)
Error

## 2021-02-18 ENCOUNTER — Other Ambulatory Visit (INDEPENDENT_AMBULATORY_CARE_PROVIDER_SITE_OTHER): Payer: Self-pay | Admitting: Internal Medicine

## 2021-02-18 ENCOUNTER — Other Ambulatory Visit (INDEPENDENT_AMBULATORY_CARE_PROVIDER_SITE_OTHER): Payer: Self-pay | Admitting: *Deleted

## 2021-02-18 MED ORDER — HYOSCYAMINE SULFATE ER 0.375 MG PO TB12
0.3750 mg | ORAL_TABLET | Freq: Every day | ORAL | 5 refills | Status: DC
Start: 2021-02-18 — End: 2021-07-21

## 2021-02-18 NOTE — Telephone Encounter (Signed)
Called and discussed with pt. Pt verbalized understanding.  

## 2021-02-18 NOTE — Telephone Encounter (Signed)
Pt last seen 01/25/20. Pt states she has been having abdominal pain on right lower side. Goes away after having BM. Taking dulcolax 2 bid every day and has a stool every 3 days.

## 2021-02-18 NOTE — Telephone Encounter (Signed)
Pt states she is not having any side effects with hysocamine and taking one daily. Will try to take one half daily instead of one daily and call back if any issues with the med. Refills sent to pharm.

## 2021-02-18 NOTE — Telephone Encounter (Signed)
Per dr Laural Golden add 4 grams of benefiber.

## 2021-02-19 NOTE — Telephone Encounter (Signed)
Dr Laural Golden does not want to change med. Need to get PA on med.

## 2021-02-19 NOTE — Telephone Encounter (Signed)
Pt's last visit was 01/25/20 and this was copied from ov note: Try LevBid if covered by insurance instead of dicyclomine. Otherwise continue dicyclomine if not covered but do not take LevBid and dicyclomine together.   Insurance now asking for alternative since hysocamine not covered.

## 2021-02-19 NOTE — Telephone Encounter (Signed)
Submitted PA through cover my meds. Await decision

## 2021-02-26 ENCOUNTER — Telehealth (INDEPENDENT_AMBULATORY_CARE_PROVIDER_SITE_OTHER): Payer: Self-pay | Admitting: *Deleted

## 2021-02-26 NOTE — Telephone Encounter (Signed)
Hyoscyamine 0.375 not covered by insurance. Dr. Laural Golden wanted to do PA and not change med. PA was denied through cover my meds. Pt was notified to try [facility] and see what cost would be and she states she did pick it up with [facility] and it was affordable.

## 2021-03-14 DIAGNOSIS — R5383 Other fatigue: Secondary | ICD-10-CM | POA: Diagnosis not present

## 2021-03-14 DIAGNOSIS — E876 Hypokalemia: Secondary | ICD-10-CM | POA: Diagnosis not present

## 2021-03-14 DIAGNOSIS — K219 Gastro-esophageal reflux disease without esophagitis: Secondary | ICD-10-CM | POA: Diagnosis not present

## 2021-03-14 DIAGNOSIS — E7800 Pure hypercholesterolemia, unspecified: Secondary | ICD-10-CM | POA: Diagnosis not present

## 2021-03-14 DIAGNOSIS — N183 Chronic kidney disease, stage 3 unspecified: Secondary | ICD-10-CM | POA: Diagnosis not present

## 2021-03-14 DIAGNOSIS — E7801 Familial hypercholesterolemia: Secondary | ICD-10-CM | POA: Diagnosis not present

## 2021-03-14 DIAGNOSIS — E039 Hypothyroidism, unspecified: Secondary | ICD-10-CM | POA: Diagnosis not present

## 2021-03-18 DIAGNOSIS — G3184 Mild cognitive impairment, so stated: Secondary | ICD-10-CM | POA: Diagnosis not present

## 2021-03-18 DIAGNOSIS — Z1389 Encounter for screening for other disorder: Secondary | ICD-10-CM | POA: Diagnosis not present

## 2021-03-18 DIAGNOSIS — I700 Atherosclerosis of aorta: Secondary | ICD-10-CM | POA: Diagnosis not present

## 2021-03-18 DIAGNOSIS — R42 Dizziness and giddiness: Secondary | ICD-10-CM | POA: Diagnosis not present

## 2021-03-18 DIAGNOSIS — Z0001 Encounter for general adult medical examination with abnormal findings: Secondary | ICD-10-CM | POA: Diagnosis not present

## 2021-03-18 DIAGNOSIS — I10 Essential (primary) hypertension: Secondary | ICD-10-CM | POA: Diagnosis not present

## 2021-03-18 DIAGNOSIS — R233 Spontaneous ecchymoses: Secondary | ICD-10-CM | POA: Diagnosis not present

## 2021-03-18 DIAGNOSIS — Z1331 Encounter for screening for depression: Secondary | ICD-10-CM | POA: Diagnosis not present

## 2021-03-25 ENCOUNTER — Encounter (INDEPENDENT_AMBULATORY_CARE_PROVIDER_SITE_OTHER): Payer: Self-pay | Admitting: *Deleted

## 2021-04-04 ENCOUNTER — Other Ambulatory Visit (HOSPITAL_COMMUNITY)
Admission: RE | Admit: 2021-04-04 | Discharge: 2021-04-04 | Disposition: A | Discharge status: Home / Self Care | Payer: Medicare Other | Source: Ambulatory Visit | Attending: Cardiology | Admitting: Cardiology

## 2021-04-04 DIAGNOSIS — Z01818 Encounter for other preprocedural examination: Secondary | ICD-10-CM

## 2021-04-04 DIAGNOSIS — Z01812 Encounter for preprocedural laboratory examination: Secondary | ICD-10-CM | POA: Insufficient documentation

## 2021-04-04 DIAGNOSIS — Z20822 Contact with and (suspected) exposure to covid-19: Secondary | ICD-10-CM | POA: Diagnosis not present

## 2021-04-05 LAB — SARS CORONAVIRUS 2 (TAT 6-24 HRS): SARS Coronavirus 2: NEGATIVE

## 2021-04-08 ENCOUNTER — Other Ambulatory Visit: Payer: Self-pay

## 2021-04-08 ENCOUNTER — Ambulatory Visit (HOSPITAL_COMMUNITY)
Admission: RE | Admit: 2021-04-08 | Discharge: 2021-04-08 | Disposition: A | Discharge status: Home / Self Care | Payer: Medicare Other | Source: Ambulatory Visit | Attending: Cardiology | Admitting: Cardiology

## 2021-04-08 DIAGNOSIS — R0609 Other forms of dyspnea: Secondary | ICD-10-CM

## 2021-04-08 DIAGNOSIS — R0602 Shortness of breath: Secondary | ICD-10-CM | POA: Insufficient documentation

## 2021-04-08 LAB — PULMONARY FUNCTION TEST
DL/VA % pred: 71 %
DL/VA: 2.95 ml/min/mmHg/L
DLCO unc % pred: 67 %
DLCO unc: 11.91 ml/min/mmHg
FEF 25-75 Post: 2.75 L/sec
FEF 25-75 Pre: 2.65 L/sec
FEF2575-%Change-Post: 3 %
FEF2575-%Pred-Post: 196 %
FEF2575-%Pred-Pre: 189 %
FEV1-%Change-Post: 2 %
FEV1-%Pred-Post: 126 %
FEV1-%Pred-Pre: 123 %
FEV1-Post: 2.3 L
FEV1-Pre: 2.24 L
FEV1FVC-%Change-Post: 3 %
FEV1FVC-%Pred-Pre: 110 %
FEV6-%Change-Post: 0 %
FEV6-%Pred-Post: 116 %
FEV6-%Pred-Pre: 115 %
FEV6-Post: 2.7 L
FEV6-Pre: 2.68 L
FEV6FVC-%Pred-Post: 105 %
FEV6FVC-%Pred-Pre: 105 %
FVC-%Change-Post: 0 %
FVC-%Pred-Post: 110 %
FVC-%Pred-Pre: 111 %
FVC-Post: 2.71 L
FVC-Pre: 2.73 L
Post FEV1/FVC ratio: 85 %
Post FEV6/FVC ratio: 100 %
Pre FEV1/FVC ratio: 82 %
Pre FEV6/FVC Ratio: 100 %
RV % pred: 77 %
RV: 1.74 L
TLC % pred: 99 %
TLC: 4.71 L

## 2021-04-08 MED ORDER — ALBUTEROL SULFATE (2.5 MG/3ML) 0.083% IN NEBU
2.5000 mg | INHALATION_SOLUTION | Freq: Once | RESPIRATORY_TRACT | Status: AC
  Administered 2021-04-08: 2.5 mg via RESPIRATORY_TRACT

## 2021-04-17 ENCOUNTER — Encounter: Payer: Self-pay | Admitting: *Deleted

## 2021-04-21 ENCOUNTER — Telehealth: Payer: Self-pay | Admitting: Cardiology

## 2021-04-21 NOTE — Telephone Encounter (Signed)
° °  Pt is calling to get pulmonary function test

## 2021-04-21 NOTE — Telephone Encounter (Signed)
Arnoldo Lenis, MD  04/09/2021  3:47 PM EST     Very minor abnormal findings on breathing tests, overall look good   Zandra Abts MD

## 2021-04-22 NOTE — Telephone Encounter (Signed)
Pt notified and verbalized understanding of PFT results. Pt had no questions or concerns at the time. PCP copied.

## 2021-04-26 ENCOUNTER — Other Ambulatory Visit: Payer: Self-pay | Admitting: Cardiology

## 2021-05-12 DIAGNOSIS — Z6830 Body mass index (BMI) 30.0-30.9, adult: Secondary | ICD-10-CM | POA: Diagnosis not present

## 2021-05-12 DIAGNOSIS — H8112 Benign paroxysmal vertigo, left ear: Secondary | ICD-10-CM | POA: Diagnosis not present

## 2021-05-15 ENCOUNTER — Encounter (INDEPENDENT_AMBULATORY_CARE_PROVIDER_SITE_OTHER): Payer: Self-pay | Admitting: *Deleted

## 2021-05-22 ENCOUNTER — Telehealth (INDEPENDENT_AMBULATORY_CARE_PROVIDER_SITE_OTHER): Payer: Self-pay | Admitting: *Deleted

## 2021-05-22 ENCOUNTER — Encounter (INDEPENDENT_AMBULATORY_CARE_PROVIDER_SITE_OTHER): Payer: Self-pay | Admitting: *Deleted

## 2021-05-22 MED ORDER — PEG 3350-KCL-NA BICARB-NACL 420 G PO SOLR: 4000.0000 mL | Freq: Once | ORAL | 0 refills | Status: AC

## 2021-05-22 NOTE — Telephone Encounter (Signed)
Patient needs trilyte 

## 2021-05-22 NOTE — Telephone Encounter (Signed)
Referring MD/PCP: sasser  Procedure: tcs  Reason/Indication:  hx polyps  Has patient had this procedure before?  Yes, 03/2018  If so, when, by whom and where?    Is there a family history of colon cancer?  no  Who?  What age when diagnosed?    Is patient diabetic? If yes, Type 1 or Type 2   no      Does patient have prosthetic heart valve or mechanical valve?  no  Do you have a pacemaker/defibrillator?  no  Has patient ever had endocarditis/atrial fibrillation? no  Does patient use oxygen? no  Has patient had joint replacement within last 12 months?  no  Is patient constipated or do they take laxatives? no  Does patient have a history of alcohol/drug use?  no  Have you had a stroke/heart attack last 6 mths? no  Do you take medicine for weight loss?  no  For female patients,: have you had a hysterectomy                       are you post menopausal                       do you still have your menstrual cycle no  Is patient on blood thinner such as Coumadin, Plavix and/or Aspirin? yes  Medications: asa 81 mg daily, plavix 75 mg daily, acyclovir 400 mg daily, albuterol 2 puffs daily, amlodipine 10 mg daily, benefiber daily, betamethasone cream bid, cetrizine 10 mg daily, citalopram 20 mg daily, hydralazine 5 mg daily, hyoscyamine 0.375 mg daily, isosorbide 60 mg bid, levothyroxine 77 mcg daily, melatonin 5 mg daily, nitro prn, omeprazole 20 mg daily, rosuvastatin 20 mg daily   Allergies: ace inhibitors, gemfibrozil, ranexa, moxifloxacin  Medication Adjustment per Dr Rehman/Dr Jenetta Downer: asa 2 days & plavix 5 days (request faxed to Dr Quintin Alto on 05/15/21 & 05/22/21)  Procedure date & time: 06/18/21

## 2021-05-23 ENCOUNTER — Other Ambulatory Visit (INDEPENDENT_AMBULATORY_CARE_PROVIDER_SITE_OTHER): Payer: Self-pay

## 2021-05-23 ENCOUNTER — Telehealth (INDEPENDENT_AMBULATORY_CARE_PROVIDER_SITE_OTHER): Payer: Self-pay | Admitting: *Deleted

## 2021-05-23 DIAGNOSIS — Z86010 Personal history of colonic polyps: Secondary | ICD-10-CM

## 2021-05-23 NOTE — Telephone Encounter (Signed)
Per Dr Quintin Alto it is ok for patient to hold ASA 2 days & Plavix 5 days prior to procedure sch'd 06/19/21 - patient is aware

## 2021-06-02 ENCOUNTER — Other Ambulatory Visit: Payer: Self-pay | Admitting: *Deleted

## 2021-06-02 MED ORDER — HYDRALAZINE HCL 25 MG PO TABS: 25.0000 mg | ORAL_TABLET | Freq: Two times a day (BID) | ORAL | 0 refills | Status: DC

## 2021-06-03 DIAGNOSIS — Z85820 Personal history of malignant melanoma of skin: Secondary | ICD-10-CM | POA: Diagnosis not present

## 2021-06-03 DIAGNOSIS — L570 Actinic keratosis: Secondary | ICD-10-CM | POA: Diagnosis not present

## 2021-06-03 DIAGNOSIS — Z85828 Personal history of other malignant neoplasm of skin: Secondary | ICD-10-CM | POA: Diagnosis not present

## 2021-06-12 NOTE — Patient Instructions (Signed)
? ? ? ? ? Annette Sharp ? 06/12/2021  ?  ? '@PREFPERIOPPHARMACY'$ @ ? ? Your procedure is scheduled on  06/18/2021. ? ? Report to [facility] at  0930  A.M. ? ? Call this number if you have problems the morning of surgery: ? [phone] ? ? Remember: ? Follow the diet and prep instructions given to you by the office. ? ?Your last dose of aspirin should have been on 06/12/2021. ? ?Your last dose of plavix should have been 06/15/2021. ? ? ?Use your inhaler before you come and bring your rescue inhaler with you. ?  ? Take these medicines the morning of surgery with A SIP OF WATER  ? ?amlodipine, zyrtec, celexa, isosorbide, levothyroxine, prilosec. ?  ? Do not wear jewelry, make-up or nail polish. ? Do not wear lotions, powders, or perfumes, or deodorant. ? Do not shave 48 hours prior to surgery.  Men may shave face and neck. ? Do not bring valuables to the hospital. ? [facility] is not responsible for any belongings or valuables. ? ?Contacts, dentures or bridgework may not be worn into surgery.  Leave your suitcase in the car.  After surgery it may be brought to your room. ? ?For patients admitted to the hospital, discharge time will be determined by your treatment team. ? ?Patients discharged the day of surgery will not be allowed to drive home and must have someone with them for 24 hours.  ? ? ?Special instructions:   DO NOT smoke tobacco or vape for 24 hours before your procedure. ? ?Please read over the following fact sheets that you were given. ?Anesthesia Post-op Instructions and Care and Recovery After Surgery ?  ? ? ? Colonoscopy, Adult, Care After ?This sheet gives you information about how to care for yourself after your procedure. Your health care provider may also give you more specific instructions. If you have problems or questions, contact your health care provider. ?What can I expect after the procedure? ?After the procedure, it is common to have: ?A small amount of blood in your stool for 24 hours after the  procedure. ?Some gas. ?Mild cramping or bloating of your abdomen. ?Follow these instructions at home: ?Eating and drinking ? ?Drink enough fluid to keep your urine pale yellow. ?Follow instructions from your health care provider about eating or drinking restrictions. ?Resume your normal diet as instructed by your health care provider. Avoid heavy or fried foods that are hard to digest. ?Activity ?Rest as told by your health care provider. ?Avoid sitting for a long time without moving. Get up to take short walks every 1-2 hours. This is important to improve blood flow and breathing. Ask for help if you feel weak or unsteady. ?Return to your normal activities as told by your health care provider. Ask your health care provider what activities are safe for you. ?Managing cramping and bloating ? ?Try walking around when you have cramps or feel bloated. ?Apply heat to your abdomen as told by your health care provider. Use the heat source that your health care provider recommends, such as a moist heat pack or a heating pad. ?Place a towel between your skin and the heat source. ?Leave the heat on for 20-30 minutes. ?Remove the heat if your skin turns bright red. This is especially important if you are unable to feel pain, heat, or cold. You may have a greater risk of getting burned. ?General instructions ?If you were given a sedative during the procedure, it can affect  you for several hours. Do not drive or operate machinery until your health care provider says that it is safe. ?For the first 24 hours after the procedure: ?Do not sign important documents. ?Do not drink alcohol. ?Do your regular daily activities at a slower pace than normal. ?Eat soft foods that are easy to digest. ?Take over-the-counter and prescription medicines only as told by your health care provider. ?Keep all follow-up visits as told by your health care provider. This is important. ?Contact a health care provider if: ?You have blood in your stool 2-3  days after the procedure. ?Get help right away if you have: ?More than a small spotting of blood in your stool. ?Large blood clots in your stool. ?Swelling of your abdomen. ?Nausea or vomiting. ?A fever. ?Increasing pain in your abdomen that is not relieved with medicine. ?Summary ?After the procedure, it is common to have a small amount of blood in your stool. You may also have mild cramping and bloating of your abdomen. ?If you were given a sedative during the procedure, it can affect you for several hours. Do not drive or operate machinery until your health care provider says that it is safe. ?Get help right away if you have a lot of blood in your stool, nausea or vomiting, a fever, or increased pain in your abdomen. ?This information is not intended to replace advice given to you by your health care provider. Make sure you discuss any questions you have with your health care provider. ?Document Revised: 01/20/2019 Document Reviewed: 10/10/2018 ?Elsevier Patient Education ? [place]. ?Monitored Anesthesia Care, Care After ?This sheet gives you information about how to care for yourself after your procedure. Your health care provider may also give you more specific instructions. If you have problems or questions, contact your health care provider. ?What can I expect after the procedure? ?After the procedure, it is common to have: ?Tiredness. ?Forgetfulness about what happened after the procedure. ?Impaired judgment for important decisions. ?Nausea or vomiting. ?Some difficulty with balance. ?Follow these instructions at home: ?For the time period you were told by your health care provider: ?  ?Rest as needed. ?Do not participate in activities where you could fall or become injured. ?Do not drive or use machinery. ?Do not drink alcohol. ?Do not take sleeping pills or medicines that cause drowsiness. ?Do not make important decisions or sign legal documents. ?Do not take care of children on your own. ?Eating  and drinking ?Follow the diet that is recommended by your health care provider. ?Drink enough fluid to keep your urine pale yellow. ?If you vomit: ?Drink water, juice, or soup when you can drink without vomiting. ?Make sure you have little or no nausea before eating solid foods. ?General instructions ?Have a responsible adult stay with you for the time you are told. It is important to have someone help care for you until you are awake and alert. ?Take over-the-counter and prescription medicines only as told by your health care provider. ?If you have sleep apnea, surgery and certain medicines can increase your risk for breathing problems. Follow instructions from your health care provider about wearing your sleep device: ?Anytime you are sleeping, including during daytime naps. ?While taking prescription pain medicines, sleeping medicines, or medicines that make you drowsy. ?Avoid smoking. ?Keep all follow-up visits as told by your health care provider. This is important. ?Contact a health care provider if: ?You keep feeling nauseous or you keep vomiting. ?You feel light-headed. ?You are still sleepy or  having trouble with balance after 24 hours. ?You develop a rash. ?You have a fever. ?You have redness or swelling around the IV site. ?Get help right away if: ?You have trouble breathing. ?You have new-onset confusion at home. ?Summary ?For several hours after your procedure, you may feel tired. You may also be forgetful and have poor judgment. ?Have a responsible adult stay with you for the time you are told. It is important to have someone help care for you until you are awake and alert. ?Rest as told. Do not drive or operate machinery. Do not drink alcohol or take sleeping pills. ?Get help right away if you have trouble breathing, or if you suddenly become confused. ?This information is not intended to replace advice given to you by your health care provider. Make sure you discuss any questions you have with your  health care provider. ?Document Revised: 11/30/2019 Document Reviewed: 02/16/2019 ?Elsevier Patient Education ? [place]. ? ?

## 2021-06-16 ENCOUNTER — Encounter (HOSPITAL_COMMUNITY)
Admission: RE | Admit: 2021-06-16 | Discharge: 2021-06-16 | Disposition: A | Discharge status: Home / Self Care | Payer: Medicare Other | Source: Ambulatory Visit | Attending: Internal Medicine | Admitting: Internal Medicine

## 2021-06-18 ENCOUNTER — Ambulatory Visit (HOSPITAL_COMMUNITY): Payer: Medicare Other | Admitting: Anesthesiology

## 2021-06-18 ENCOUNTER — Ambulatory Visit (HOSPITAL_COMMUNITY)
Admission: RE | Admit: 2021-06-18 | Discharge: 2021-06-18 | Disposition: A | Discharge status: Home / Self Care | Payer: Medicare Other | Source: Ambulatory Visit | Attending: Internal Medicine | Admitting: Internal Medicine

## 2021-06-18 ENCOUNTER — Encounter (HOSPITAL_COMMUNITY): Payer: Self-pay | Admitting: Internal Medicine

## 2021-06-18 ENCOUNTER — Ambulatory Visit (HOSPITAL_BASED_OUTPATIENT_CLINIC_OR_DEPARTMENT_OTHER): Payer: Medicare Other | Admitting: Anesthesiology

## 2021-06-18 ENCOUNTER — Encounter (HOSPITAL_COMMUNITY)
Admission: RE | Disposition: A | Discharge status: Home / Self Care | Payer: Self-pay | Source: Ambulatory Visit | Attending: Internal Medicine

## 2021-06-18 DIAGNOSIS — K644 Residual hemorrhoidal skin tags: Secondary | ICD-10-CM

## 2021-06-18 DIAGNOSIS — Z86010 Personal history of colonic polyps: Secondary | ICD-10-CM

## 2021-06-18 DIAGNOSIS — E039 Hypothyroidism, unspecified: Secondary | ICD-10-CM | POA: Insufficient documentation

## 2021-06-18 DIAGNOSIS — Z1211 Encounter for screening for malignant neoplasm of colon: Secondary | ICD-10-CM | POA: Diagnosis not present

## 2021-06-18 DIAGNOSIS — Z955 Presence of coronary angioplasty implant and graft: Secondary | ICD-10-CM | POA: Insufficient documentation

## 2021-06-18 DIAGNOSIS — Z09 Encounter for follow-up examination after completed treatment for conditions other than malignant neoplasm: Secondary | ICD-10-CM | POA: Diagnosis not present

## 2021-06-18 DIAGNOSIS — Z7982 Long term (current) use of aspirin: Secondary | ICD-10-CM | POA: Diagnosis not present

## 2021-06-18 DIAGNOSIS — Z7902 Long term (current) use of antithrombotics/antiplatelets: Secondary | ICD-10-CM | POA: Insufficient documentation

## 2021-06-18 DIAGNOSIS — I129 Hypertensive chronic kidney disease with stage 1 through stage 4 chronic kidney disease, or unspecified chronic kidney disease: Secondary | ICD-10-CM | POA: Insufficient documentation

## 2021-06-18 DIAGNOSIS — K5730 Diverticulosis of large intestine without perforation or abscess without bleeding: Secondary | ICD-10-CM

## 2021-06-18 DIAGNOSIS — N189 Chronic kidney disease, unspecified: Secondary | ICD-10-CM | POA: Insufficient documentation

## 2021-06-18 DIAGNOSIS — I25119 Atherosclerotic heart disease of native coronary artery with unspecified angina pectoris: Secondary | ICD-10-CM | POA: Diagnosis not present

## 2021-06-18 DIAGNOSIS — K219 Gastro-esophageal reflux disease without esophagitis: Secondary | ICD-10-CM | POA: Insufficient documentation

## 2021-06-18 DIAGNOSIS — K648 Other hemorrhoids: Secondary | ICD-10-CM | POA: Diagnosis not present

## 2021-06-18 DIAGNOSIS — D123 Benign neoplasm of transverse colon: Secondary | ICD-10-CM | POA: Insufficient documentation

## 2021-06-18 DIAGNOSIS — G4730 Sleep apnea, unspecified: Secondary | ICD-10-CM | POA: Insufficient documentation

## 2021-06-18 DIAGNOSIS — K635 Polyp of colon: Secondary | ICD-10-CM

## 2021-06-18 HISTORY — PX: BIOPSY: SHX5522

## 2021-06-18 HISTORY — PX: COLONOSCOPY WITH PROPOFOL: SHX5780

## 2021-06-18 HISTORY — PX: POLYPECTOMY: SHX5525

## 2021-06-18 SURGERY — COLONOSCOPY WITH PROPOFOL
Anesthesia: General

## 2021-06-18 MED ORDER — EPHEDRINE 5 MG/ML INJ
INTRAVENOUS | Status: AC
  Filled 2021-06-18: qty 5

## 2021-06-18 MED ORDER — LACTATED RINGERS IV SOLN
INTRAVENOUS | Status: DC
  Administered 2021-06-18: 1000 mL via INTRAVENOUS

## 2021-06-18 MED ORDER — PROPOFOL 500 MG/50ML IV EMUL
INTRAVENOUS | Status: DC | PRN
  Administered 2021-06-18: 100 ug/kg/min via INTRAVENOUS

## 2021-06-18 MED ORDER — PROPOFOL 10 MG/ML IV BOLUS
INTRAVENOUS | Status: DC | PRN
  Administered 2021-06-18: 100 mg via INTRAVENOUS

## 2021-06-18 MED ORDER — LIDOCAINE HCL (CARDIAC) PF 100 MG/5ML IV SOSY
PREFILLED_SYRINGE | INTRAVENOUS | Status: DC | PRN
  Administered 2021-06-18: 50 mg via INTRAVENOUS

## 2021-06-18 MED ORDER — EPHEDRINE SULFATE-NACL 50-0.9 MG/10ML-% IV SOSY
PREFILLED_SYRINGE | INTRAVENOUS | Status: DC | PRN
  Administered 2021-06-18: 5 mg via INTRAVENOUS

## 2021-06-18 NOTE — Anesthesia Procedure Notes (Signed)
Date/Time: 06/18/2021 10:32 AM ?Performed by: Orlie Dakin, CRNA ?Pre-anesthesia Checklist: Patient identified, Emergency Drugs available, Suction available and Patient being monitored ?Patient Re-evaluated:Patient Re-evaluated prior to induction ?Oxygen Delivery Method: Nasal cannula ?Induction Type: IV induction ?Placement Confirmation: positive ETCO2 ? ? ? ? ?

## 2021-06-18 NOTE — Op Note (Signed)
[facility] ?Patient Name: Annette Sharp ?Procedure Date: 06/18/2021 10:09 AM ?MRN: [id number] ?Date of Birth: November 23, 1940 ?Attending MD: Hildred Laser , MD ?CSN: [id number] ?Age: 81 ?Admit Type: Outpatient ?Procedure:                Colonoscopy ?Indications:              High risk colon cancer surveillance: Personal  ?                          history of colonic polyps ?Providers:                Hildred Laser, MD, Lambert Mody, [place]  ?                          Shanon Brow, Technician ?Referring MD:             Consuello Masse, MD ?Medicines:                 ?Complications:            No immediate complications. ?Estimated Blood Loss:     Estimated blood loss was minimal. ?Procedure:                Pre-Anesthesia Assessment: ?                          - Prior to the procedure, a History and Physical  ?                          was performed, and patient medications and  ?                          allergies were reviewed. The patient's tolerance of  ?                          previous anesthesia was also reviewed. The risks  ?                          and benefits of the procedure and the sedation  ?                          options and risks were discussed with the patient.  ?                          All questions were answered, and informed consent  ?                          was obtained. Prior Anticoagulants: The patient  ?                          last took Plavix (clopidogrel) 5 days prior to the  ?                          procedure and has taken no previous anticoagulant  ?                          or  antiplatelet agents except for aspirin. ASA  ?                          Grade Assessment: II - A patient with mild systemic  ?                          disease. After reviewing the risks and benefits,  ?                          the patient was deemed in satisfactory condition to  ?                          undergo the procedure. ?                          After obtaining informed consent, the colonoscope  ?                           was passed under direct vision. Throughout the  ?                          procedure, the patient's blood pressure, pulse, and  ?                          oxygen saturations were monitored continuously. The  ?                          [id number] ([id number]) was introduced through the  ?                          anus and advanced to the the cecum, identified by  ?                          appendiceal orifice and ileocecal valve. The  ?                          colonoscopy was performed without difficulty. The  ?                          patient tolerated the procedure well. The quality  ?                          of the bowel preparation was good. The ileocecal  ?                          valve, appendiceal orifice, and rectum were  ?                          photographed. ?Scope In: 10:27:41 AM ?Scope Out: 10:47:47 AM ?Scope Withdrawal Time: 0 hours 11 minutes 5 seconds  ?Total Procedure Duration: 0 hours 20 minutes 6 seconds  ?Findings: ?     The perianal and digital rectal examinations were normal. ?     A small polyp was found in the hepatic flexure. The polyp was removed  ?  with a cold snare. Resection and retrieval were complete. The pathology  ?     specimen was placed into Bottle Number 1. ?     A small polyp was found in the splenic flexure. Biopsies were taken with  ?     a cold forceps for histology. The pathology specimen was placed into  ?     Bottle Number 1. ?     A few diverticula were found in the sigmoid colon. ?     External hemorrhoids were found during retroflexion. The hemorrhoids  ?     were small. ?Impression:               - One small polyp at the hepatic flexure, removed  ?                          with a cold snare. Resected and retrieved. ?                          - One small polyp at the splenic flexure. Biopsied. ?                          - Diverticulosis in the sigmoid colon. ?                          - External hemorrhoids. ?Moderate Sedation: ?     Per  Anesthesia Care ?Recommendation:           - Patient has a contact number available for  ?                          emergencies. The signs and symptoms of potential  ?                          delayed complications were discussed with the  ?                          patient. Return to normal activities tomorrow.  ?                          Written discharge instructions were provided to the  ?                          patient. ?                          - High fiber diet today. ?                          - Continue present medications. ?                          - Resume Plavix (clopidogrel) at prior dose  ?                          tomorrow. ?                          - Await pathology  results. ?                          - No recommendation at this time regarding repeat  ?                          colonoscopy. ?Procedure Code(s):        --- Professional --- ?                          [id number], Colonoscopy, flexible; with removal of  ?                          tumor(s), polyp(s), or other lesion(s) by snare  ?                          technique ?                          45380, 59, Colonoscopy, flexible; with biopsy,  ?                          single or multiple ?Diagnosis Code(s):        --- Professional --- ?                          Z86.010, Personal history of colonic polyps ?                          K63.5, Polyp of colon ?                          K64.4, Residual hemorrhoidal skin tags ?                          K57.30, Diverticulosis of large intestine without  ?                          perforation or abscess without bleeding ?CPT copyright 2019 American Medical Association. All rights reserved. ?The codes documented in this report are preliminary and upon coder review may  ?be revised to meet current compliance requirements. ?Hildred Laser, MD ?Hildred Laser, MD ?06/18/2021 10:57:16 AM ?This report has been signed electronically. ?Number of Addenda: 0 ?

## 2021-06-18 NOTE — Anesthesia Postprocedure Evaluation (Signed)
Anesthesia Post Note ? ?Patient: Annette Sharp ? ?Procedure(s) Performed: COLONOSCOPY WITH PROPOFOL ?POLYPECTOMY ?BIOPSY ? ?Patient location during evaluation: Phase II ?Anesthesia Type: General ?Level of consciousness: awake and alert and oriented ?Pain management: pain level controlled ?Vital Signs Assessment: post-procedure vital signs reviewed and stable ?Respiratory status: spontaneous breathing, nonlabored ventilation and respiratory function stable ?Cardiovascular status: blood pressure returned to baseline and stable ?Postop Assessment: no apparent nausea or vomiting ?Anesthetic complications: no ? ? ?No notable events documented. ? ? ?Last Vitals:  ?Vitals:  ? 06/18/21 0925 06/18/21 1052  ?BP: (!) 153/53 (!) 102/32  ?Pulse: 60 (!) 57  ?Resp: 18 18  ?Temp: 36.5 ?C 37 ?C  ?SpO2: 98% 99%  ?  ?Last Pain:  ?Vitals:  ? 06/18/21 1052  ?TempSrc: Axillary  ?PainSc: 0-No pain  ? ? ?  ?  ?  ?  ?  ?  ? ?Margaux Engen C Laquilla Dault ? ? ? ? ?

## 2021-06-18 NOTE — Anesthesia Preprocedure Evaluation (Signed)
Anesthesia Evaluation  ?Patient identified by MRN, date of birth, ID band ?Patient awake ? ? ? ?Reviewed: ?Allergy & Precautions, NPO status , Patient's Chart, lab work & pertinent test results ? ?Airway ?Mallampati: II ? ?TM Distance: >3 FB ?Neck ROM: Full ? ? ? Dental ? ?(+) Dental Advisory Given, Teeth Intact ?  ?Pulmonary ?shortness of breath and with exertion, sleep apnea and Continuous Positive Airway Pressure Ventilation , COPD,  ?  ?Pulmonary exam normal ?breath sounds clear to auscultation ? ? ? ? ? ? Cardiovascular ?Exercise Tolerance: Good ?hypertension, Pt. on medications ?+ angina + CAD and + Cardiac Stents  ? ?Rhythm:Regular Rate:Normal ?+ Systolic murmurs ? ?  ?Neuro/Psych ? Headaches, PSYCHIATRIC DISORDERS Anxiety Depression CVA   ? GI/Hepatic ?Neg liver ROS, PUD, GERD  Medicated,  ?Endo/Other  ?Hypothyroidism  ? Renal/GU ?Renal InsufficiencyRenal disease  ?negative genitourinary ?  ?Musculoskeletal ? ?(+) Arthritis , Osteoarthritis,   ? Abdominal ?  ?Peds ?negative pediatric ROS ?(+)  Hematology ?negative hematology ROS ?(+)   ?Anesthesia Other Findings ? ? Reproductive/Obstetrics ?negative OB ROS ? ?  ? ? ? ? ? ? ? ? ? ? ? ? ? ?  ?  ? ? ? ? ? ? ? ?Anesthesia Physical ?Anesthesia Plan ? ?ASA: 3 ? ?Anesthesia Plan: General  ? ?Post-op Pain Management: Minimal or no pain anticipated  ? ?Induction: Intravenous ? ?PONV Risk Score and Plan: TIVA ? ?Airway Management Planned: Nasal Cannula and Natural Airway ? ?Additional Equipment:  ? ?Intra-op Plan:  ? ?Post-operative Plan:  ? ?Informed Consent: I have reviewed the patients History and Physical, chart, labs and discussed the procedure including the risks, benefits and alternatives for the proposed anesthesia with the patient or authorized representative who has indicated his/her understanding and acceptance.  ? ? ? ?Dental advisory given ? ?Plan Discussed with: CRNA and Surgeon ? ?Anesthesia Plan Comments:    ? ? ? ? ? ? ?Anesthesia Quick Evaluation ? ?

## 2021-06-18 NOTE — Transfer of Care (Signed)
Immediate Anesthesia Transfer of Care Note ? ?Patient: Annette Sharp ? ?Procedure(s) Performed: COLONOSCOPY WITH PROPOFOL ?POLYPECTOMY ?BIOPSY ? ?Patient Location: Short Stay ? ?Anesthesia Type:General ? ?Level of Consciousness: awake and oriented ? ?Airway & Oxygen Therapy: Patient Spontanous Breathing ? ?Post-op Assessment: Report given to RN and Post -op Vital signs reviewed and stable ? ?Post vital signs: Reviewed and stable ? ?Last Vitals:  ?Vitals Value Taken Time  ?BP    ?Temp    ?Pulse    ?Resp    ?SpO2    ? ? ?Last Pain:  ?Vitals:  ? 06/18/21 1024  ?TempSrc:   ?PainSc: 0-No pain  ?   ? ?Patients Stated Pain Goal: 6 (06/18/21 5465) ? ?Complications: No notable events documented. ?

## 2021-06-18 NOTE — Anesthesia Procedure Notes (Signed)
Date/Time: 06/18/2021 10:27 AM ?Performed by: Orlie Dakin, CRNA ?Pre-anesthesia Checklist: Patient identified, Emergency Drugs available, Suction available and Patient being monitored ?Patient Re-evaluated:Patient Re-evaluated prior to induction ?Oxygen Delivery Method: Nasal cannula ?Induction Type: IV induction ?Placement Confirmation: positive ETCO2 ? ? ? ? ?

## 2021-06-18 NOTE — Discharge Instructions (Addendum)
Resume aspirin today and clopidogrel tomorrow. ?Resume other medications as before. ?High-fiber diet. ?No driving for 24 hours. ?Physician will call with biopsy results. ? ? ?

## 2021-06-18 NOTE — H&P (Signed)
Annette Sharp is an 81 y.o. female.   ?Chief Complaint: Patient is here for colonoscopy. ?HPI: Patient is 81 year old Caucasian female who underwent colonoscopy in January 2020 with removal of 5 polyps.  They are all tubular adenomas.  1 polyp at low-grade dysplasia.  She is returning for surveillance colonoscopy.  She has no complaints.  Bowels move regularly.  She denies abdominal pain or rectal bleeding.  Family history is negative for colon cancer.  She has been off clopidogrel and aspirin for 5 days. ? ?Past Medical History:  ?Diagnosis Date  ? Anxiety   ? Back pain   ? Cerebrovascular disease   ? NONOBSTRUCTIVE CVA BY CAROTID DOPPLERS OCTOBER 2008  ? Chronic renal insufficiency   ? GFR 45 ML'S PER MINUTE  ? COPD (chronic obstructive pulmonary disease) ([place])   ? Coronary artery disease   ? a. s/p DES to RCA in 2007 b. patent stent by cath in 12/2014 with nonobstructive disease along LAD and RCA  ? Coronary atherosclerosis of native coronary artery   ? NEGATIVE LEXISCAN FOR ISCHEMIA MARCH 23,2010 NORMAL LV FUNCTTION  ? Depression   ? Dyspnea   ? with exertion  ? GERD (gastroesophageal reflux disease)   ? Headache   ? chronic headache from fall in December  ? Hyperlipidemia   ? Hypertension   ? Hypothyroidism   ? Other and unspecified angina pectoris   ? ANGINA  ? Overweight(278.02)   ? OBESITY  ? Peptic ulcer disease   ? Primary localized osteoarthritis of left knee   ? Sleep apnea   ? SEVERE OBSTRUCTIVE    cpap  ? Stroke [facility])   ? affected memory  ? Thyroid disease   ? TREATED HYPOTHYROIDISM  ? ? ?Past Surgical History:  ?Procedure Laterality Date  ? ABDOMINAL HYSTERECTOMY    ? CARDIAC CATHETERIZATION N/A 01/22/2015  ? Procedure: Right/Left Heart Cath and Coronary Angiography;  Surgeon: Wellington Hampshire, MD;  Location: [place] [facility];  Service: Cardiovascular;  Laterality: N/A;  ? CARPAL TUNNEL RELEASE    ? CHOLECYSTECTOMY    ? COLONOSCOPY N/A 03/31/2018  ? Procedure: COLONOSCOPY;  Surgeon: Rogene Houston,  MD;  [facility];  Service: Endoscopy;  Laterality: N/A;  2:45  ? JOINT REPLACEMENT    ? LEFT HEART CATH AND CORONARY ANGIOGRAPHY N/A 04/28/2018  ? Procedure: LEFT HEART CATH AND CORONARY ANGIOGRAPHY;  Surgeon: Belva Crome, MD;  Location: [place] [facility];  Service: Cardiovascular;  Laterality: N/A;  ? POLYPECTOMY  03/31/2018  ? Procedure: POLYPECTOMY;  Surgeon: Rogene Houston, MD;  [facility];  Service: Endoscopy;;  splen. flex, cecum  ? REPLACEMENT TOTAL KNEE Right 07/28/2010  ? ROTATOR CUFF REPAIR Right 07/08/2009  ? TOTAL ABDOMINAL HYSTERECTOMY W/ BILATERAL SALPINGOOPHORECTOMY    ? TOTAL KNEE ARTHROPLASTY Left 04/08/2015  ? Procedure: LEFT TOTAL KNEE ARTHROPLASTY;  Surgeon: Elsie Saas, MD;  Location: [place];  Service: Orthopedics;  Laterality: Left;  ? TOTAL SHOULDER ARTHROPLASTY Left 06/02/2017  ? Procedure: TOTAL SHOULDER ARTHROPLASTY;  Surgeon: Hiram Gash, MD;  Location: [place];  Service: Orthopedics;  Laterality: Left;  ? ? ?Family History  ?Problem Relation Age of Onset  ? CAD Other   ? Hypertension Other   ? Heart attack Other   ? Cancer Other   ? CAD Mother   ? Cancer Mother   ? CAD Father   ? Diabetes Sister   ? CAD Sister   ? Leukemia Brother   ? ?  Social History:  reports that she has never smoked. She has never used smokeless tobacco. She reports that she does not drink alcohol and does not use drugs. ? ?Allergies:  ?Allergies  ?Allergen Reactions  ? Ace Inhibitors Cough  ? Gemfibrozil Other (See Comments)  ?  Muscle Aches  ? Moxifloxacin Rash  ? Ranexa [Ranolazine] Other (See Comments)  ?  "NIGHTMARES"  ? ? ?Medications Prior to Admission  ?Medication Sig Dispense Refill  ? acyclovir (ZOVIRAX) 400 MG tablet Take 400 mg by mouth in the morning.    ? albuterol (PROVENTIL HFA;VENTOLIN HFA) 108 (90 Base) MCG/ACT inhaler Inhale 2 puffs into the lungs every 6 (six) hours as needed for wheezing or shortness of breath. 1 Inhaler 2  ? amLODipine (NORVASC) 10 MG tablet Take 10 mg by  mouth every morning.  3  ? aspirin EC 81 MG tablet Take 81 mg by mouth in the morning.    ? cetirizine (ZYRTEC) 10 MG tablet Take 10 mg by mouth daily.    ? Cholecalciferol (VITAMIN D3) 50 MCG (2000 UT) TABS Take 2,000 Units by mouth daily.    ? citalopram (CELEXA) 20 MG tablet Take 20 mg by mouth every morning.    ? clopidogrel (PLAVIX) 75 MG tablet Take 1 tablet (75 mg total) by mouth every morning.    ? hydrALAZINE (APRESOLINE) 25 MG tablet Take 1 tablet (25 mg total) by mouth 2 (two) times daily. 180 tablet 0  ? hyoscyamine (LEVBID) 0.375 MG 12 hr tablet Take 1 tablet (0.375 mg total) by mouth daily. (Patient taking differently: Take 0.375 mg by mouth daily. 1/2 tablet daily) 30 tablet 5  ? isosorbide mononitrate (IMDUR) 60 MG 24 hr tablet Take 1.5 tablets (90 mg total) by mouth 2 (two) times daily. 270 tablet 3  ? levothyroxine (SYNTHROID) 75 MCG tablet Take 75 mcg by mouth daily.    ? Melatonin 5 MG SUBL Take 5 mg by mouth at bedtime.     ? omeprazole (PRILOSEC) 20 MG capsule Take 20 mg by mouth every evening.    ? polyethylene glycol-electrolytes (NULYTELY) 420 g solution Take 4,000 mLs by mouth once.    ? rosuvastatin (CRESTOR) 20 MG tablet TAKE 1 TABLET BY MOUTH EVERY DAY (Patient taking differently: Take 20 mg by mouth every evening.) 90 tablet 1  ? Vit C-Cholecalciferol-Rose Hip (VITAMIN C & D3/ROSE HIPS PO) Take 1 tablet by mouth daily.    ? vitamin B-12 (CYANOCOBALAMIN) 1000 MCG tablet Take 1,000 mcg by mouth daily.    ? Wheat Dextrin (BENEFIBER) CHEW Chew 1 tablet by mouth daily.    ? betamethasone dipropionate (DIPROLENE) 0.05 % cream Apply 1 application topically 2 (two) times daily as needed (atrophic vaginitis).     ? nitroGLYCERIN (NITROSTAT) 0.4 MG SL tablet Place 1 tablet (0.4 mg total) under the tongue every 5 (five) minutes x 3 doses as needed for chest pain (if no relief after 3rd dose, proceed to the ED for an evaluation). 25 tablet 3  ? ? ?No results found for this or any previous visit  (from the past 48 hour(s)). ?No results found. ? ?Review of Systems ? ?Blood pressure (!) 153/53, pulse 60, temperature 97.7 ?F (36.5 ?C), temperature source Oral, resp. rate 18, height '5\' 2"'$  (1.575 m), weight 72.6 kg, SpO2 98 %. ?Physical Exam ?HENT:  ?   Mouth/Throat:  ?   Mouth: Mucous membranes are moist.  ?   Pharynx: Oropharynx is clear.  ?Eyes:  ?  General: No scleral icterus. ?   Conjunctiva/sclera: Conjunctivae normal.  ?Cardiovascular:  ?   Rate and Rhythm: Normal rate and regular rhythm.  ?   Heart sounds: Normal heart sounds. No murmur heard. ?Pulmonary:  ?   Effort: Pulmonary effort is normal.  ?   Breath sounds: Normal breath sounds.  ?Abdominal:  ?   General: There is no distension.  ?   Palpations: Abdomen is soft. There is no mass.  ?   Tenderness: There is no abdominal tenderness.  ?Musculoskeletal:  ?   Cervical back: Neck supple.  ?Lymphadenopathy:  ?   Cervical: No cervical adenopathy.  ?Skin: ?   General: Skin is warm and dry.  ?Neurological:  ?   Mental Status: She is alert.  ?  ? ?Assessment/Plan ? ?History of multiple colonic adenomas. ?Surveillance colonoscopy. ? ?Hildred Laser, MD ?06/18/2021, 10:20 AM ? ? ? ?

## 2021-06-19 LAB — SURGICAL PATHOLOGY

## 2021-06-23 ENCOUNTER — Encounter (HOSPITAL_COMMUNITY): Payer: Self-pay | Admitting: Internal Medicine

## 2021-07-02 DIAGNOSIS — R590 Localized enlarged lymph nodes: Secondary | ICD-10-CM | POA: Diagnosis not present

## 2021-07-02 DIAGNOSIS — J449 Chronic obstructive pulmonary disease, unspecified: Secondary | ICD-10-CM | POA: Diagnosis not present

## 2021-07-02 DIAGNOSIS — K5730 Diverticulosis of large intestine without perforation or abscess without bleeding: Secondary | ICD-10-CM | POA: Diagnosis not present

## 2021-07-02 DIAGNOSIS — I700 Atherosclerosis of aorta: Secondary | ICD-10-CM | POA: Diagnosis not present

## 2021-07-02 DIAGNOSIS — M25579 Pain in unspecified ankle and joints of unspecified foot: Secondary | ICD-10-CM | POA: Diagnosis not present

## 2021-07-02 DIAGNOSIS — M47816 Spondylosis without myelopathy or radiculopathy, lumbar region: Secondary | ICD-10-CM | POA: Diagnosis not present

## 2021-07-02 DIAGNOSIS — I10 Essential (primary) hypertension: Secondary | ICD-10-CM | POA: Diagnosis not present

## 2021-07-02 DIAGNOSIS — R350 Frequency of micturition: Secondary | ICD-10-CM | POA: Diagnosis not present

## 2021-07-02 DIAGNOSIS — R109 Unspecified abdominal pain: Secondary | ICD-10-CM | POA: Diagnosis not present

## 2021-07-02 DIAGNOSIS — N200 Calculus of kidney: Secondary | ICD-10-CM | POA: Diagnosis not present

## 2021-07-02 DIAGNOSIS — M545 Low back pain, unspecified: Secondary | ICD-10-CM | POA: Diagnosis not present

## 2021-07-08 DIAGNOSIS — N183 Chronic kidney disease, stage 3 unspecified: Secondary | ICD-10-CM | POA: Diagnosis not present

## 2021-07-08 DIAGNOSIS — Z96653 Presence of artificial knee joint, bilateral: Secondary | ICD-10-CM | POA: Diagnosis not present

## 2021-07-08 DIAGNOSIS — I700 Atherosclerosis of aorta: Secondary | ICD-10-CM | POA: Diagnosis not present

## 2021-07-08 DIAGNOSIS — Z6830 Body mass index (BMI) 30.0-30.9, adult: Secondary | ICD-10-CM | POA: Diagnosis not present

## 2021-07-08 DIAGNOSIS — I880 Nonspecific mesenteric lymphadenitis: Secondary | ICD-10-CM | POA: Diagnosis not present

## 2021-07-08 DIAGNOSIS — M545 Low back pain, unspecified: Secondary | ICD-10-CM | POA: Diagnosis not present

## 2021-07-08 DIAGNOSIS — M7582 Other shoulder lesions, left shoulder: Secondary | ICD-10-CM | POA: Diagnosis not present

## 2021-07-08 DIAGNOSIS — F32A Depression, unspecified: Secondary | ICD-10-CM | POA: Diagnosis not present

## 2021-07-08 DIAGNOSIS — M25511 Pain in right shoulder: Secondary | ICD-10-CM | POA: Diagnosis not present

## 2021-07-08 DIAGNOSIS — M4800 Spinal stenosis, site unspecified: Secondary | ICD-10-CM | POA: Diagnosis not present

## 2021-07-20 ENCOUNTER — Other Ambulatory Visit (INDEPENDENT_AMBULATORY_CARE_PROVIDER_SITE_OTHER): Payer: Self-pay | Admitting: Internal Medicine

## 2021-08-15 DIAGNOSIS — N183 Chronic kidney disease, stage 3 unspecified: Secondary | ICD-10-CM | POA: Diagnosis not present

## 2021-08-15 DIAGNOSIS — I10 Essential (primary) hypertension: Secondary | ICD-10-CM | POA: Diagnosis not present

## 2021-08-15 DIAGNOSIS — R5383 Other fatigue: Secondary | ICD-10-CM | POA: Diagnosis not present

## 2021-08-15 DIAGNOSIS — E039 Hypothyroidism, unspecified: Secondary | ICD-10-CM | POA: Diagnosis not present

## 2021-08-15 DIAGNOSIS — K219 Gastro-esophageal reflux disease without esophagitis: Secondary | ICD-10-CM | POA: Diagnosis not present

## 2021-08-19 DIAGNOSIS — N183 Chronic kidney disease, stage 3 unspecified: Secondary | ICD-10-CM | POA: Diagnosis not present

## 2021-08-19 DIAGNOSIS — R233 Spontaneous ecchymoses: Secondary | ICD-10-CM | POA: Diagnosis not present

## 2021-08-19 DIAGNOSIS — K589 Irritable bowel syndrome without diarrhea: Secondary | ICD-10-CM | POA: Diagnosis not present

## 2021-08-19 DIAGNOSIS — I880 Nonspecific mesenteric lymphadenitis: Secondary | ICD-10-CM | POA: Diagnosis not present

## 2021-08-19 DIAGNOSIS — G3184 Mild cognitive impairment, so stated: Secondary | ICD-10-CM | POA: Diagnosis not present

## 2021-08-19 DIAGNOSIS — I700 Atherosclerosis of aorta: Secondary | ICD-10-CM | POA: Diagnosis not present

## 2021-08-19 DIAGNOSIS — I10 Essential (primary) hypertension: Secondary | ICD-10-CM | POA: Diagnosis not present

## 2021-08-19 DIAGNOSIS — I200 Unstable angina: Secondary | ICD-10-CM | POA: Diagnosis not present

## 2021-08-30 ENCOUNTER — Other Ambulatory Visit: Payer: Self-pay | Admitting: Cardiology

## 2021-09-29 ENCOUNTER — Other Ambulatory Visit: Payer: Self-pay | Admitting: Cardiology

## 2021-10-01 DIAGNOSIS — R5383 Other fatigue: Secondary | ICD-10-CM | POA: Diagnosis not present

## 2021-10-01 DIAGNOSIS — E039 Hypothyroidism, unspecified: Secondary | ICD-10-CM | POA: Diagnosis not present

## 2021-10-02 DIAGNOSIS — R030 Elevated blood-pressure reading, without diagnosis of hypertension: Secondary | ICD-10-CM | POA: Diagnosis not present

## 2021-10-02 DIAGNOSIS — D519 Vitamin B12 deficiency anemia, unspecified: Secondary | ICD-10-CM | POA: Diagnosis not present

## 2021-10-02 DIAGNOSIS — K1370 Unspecified lesions of oral mucosa: Secondary | ICD-10-CM | POA: Diagnosis not present

## 2021-10-02 DIAGNOSIS — Z6830 Body mass index (BMI) 30.0-30.9, adult: Secondary | ICD-10-CM | POA: Diagnosis not present

## 2021-10-20 DIAGNOSIS — N189 Chronic kidney disease, unspecified: Secondary | ICD-10-CM | POA: Diagnosis not present

## 2021-10-20 DIAGNOSIS — I10 Essential (primary) hypertension: Secondary | ICD-10-CM | POA: Diagnosis not present

## 2021-10-20 DIAGNOSIS — E876 Hypokalemia: Secondary | ICD-10-CM | POA: Diagnosis not present

## 2021-10-23 ENCOUNTER — Other Ambulatory Visit: Payer: Self-pay | Admitting: Cardiology

## 2021-11-02 ENCOUNTER — Other Ambulatory Visit: Payer: Self-pay | Admitting: Cardiology

## 2021-11-03 DIAGNOSIS — E7801 Familial hypercholesterolemia: Secondary | ICD-10-CM | POA: Diagnosis not present

## 2021-11-03 DIAGNOSIS — N183 Chronic kidney disease, stage 3 unspecified: Secondary | ICD-10-CM | POA: Diagnosis not present

## 2021-11-03 DIAGNOSIS — M10079 Idiopathic gout, unspecified ankle and foot: Secondary | ICD-10-CM | POA: Diagnosis not present

## 2021-11-03 DIAGNOSIS — I10 Essential (primary) hypertension: Secondary | ICD-10-CM | POA: Diagnosis not present

## 2021-11-03 DIAGNOSIS — K219 Gastro-esophageal reflux disease without esophagitis: Secondary | ICD-10-CM | POA: Diagnosis not present

## 2021-11-03 DIAGNOSIS — E876 Hypokalemia: Secondary | ICD-10-CM | POA: Diagnosis not present

## 2021-11-03 DIAGNOSIS — R5383 Other fatigue: Secondary | ICD-10-CM | POA: Diagnosis not present

## 2021-11-06 DIAGNOSIS — R233 Spontaneous ecchymoses: Secondary | ICD-10-CM | POA: Diagnosis not present

## 2021-11-06 DIAGNOSIS — B370 Candidal stomatitis: Secondary | ICD-10-CM | POA: Diagnosis not present

## 2021-11-06 DIAGNOSIS — I880 Nonspecific mesenteric lymphadenitis: Secondary | ICD-10-CM | POA: Diagnosis not present

## 2021-11-06 DIAGNOSIS — M47812 Spondylosis without myelopathy or radiculopathy, cervical region: Secondary | ICD-10-CM | POA: Diagnosis not present

## 2021-11-06 DIAGNOSIS — I10 Essential (primary) hypertension: Secondary | ICD-10-CM | POA: Diagnosis not present

## 2021-11-06 DIAGNOSIS — R1310 Dysphagia, unspecified: Secondary | ICD-10-CM | POA: Diagnosis not present

## 2021-11-06 DIAGNOSIS — R42 Dizziness and giddiness: Secondary | ICD-10-CM | POA: Diagnosis not present

## 2021-11-06 DIAGNOSIS — I200 Unstable angina: Secondary | ICD-10-CM | POA: Diagnosis not present

## 2021-11-06 DIAGNOSIS — K589 Irritable bowel syndrome without diarrhea: Secondary | ICD-10-CM | POA: Diagnosis not present

## 2021-11-06 DIAGNOSIS — M4800 Spinal stenosis, site unspecified: Secondary | ICD-10-CM | POA: Diagnosis not present

## 2021-11-06 DIAGNOSIS — I700 Atherosclerosis of aorta: Secondary | ICD-10-CM | POA: Diagnosis not present

## 2021-11-06 DIAGNOSIS — G3184 Mild cognitive impairment, so stated: Secondary | ICD-10-CM | POA: Diagnosis not present

## 2021-11-17 DIAGNOSIS — R413 Other amnesia: Secondary | ICD-10-CM | POA: Diagnosis not present

## 2021-11-25 ENCOUNTER — Encounter (INDEPENDENT_AMBULATORY_CARE_PROVIDER_SITE_OTHER): Payer: Self-pay | Admitting: *Deleted

## 2021-12-02 DIAGNOSIS — Z85828 Personal history of other malignant neoplasm of skin: Secondary | ICD-10-CM | POA: Diagnosis not present

## 2021-12-02 DIAGNOSIS — Z85820 Personal history of malignant melanoma of skin: Secondary | ICD-10-CM | POA: Diagnosis not present

## 2021-12-02 DIAGNOSIS — L570 Actinic keratosis: Secondary | ICD-10-CM | POA: Diagnosis not present

## 2021-12-17 ENCOUNTER — Other Ambulatory Visit: Payer: Self-pay | Admitting: Cardiology

## 2021-12-23 ENCOUNTER — Telehealth: Payer: Self-pay | Admitting: Cardiology

## 2021-12-23 DIAGNOSIS — M2550 Pain in unspecified joint: Secondary | ICD-10-CM | POA: Diagnosis not present

## 2021-12-23 DIAGNOSIS — R2689 Other abnormalities of gait and mobility: Secondary | ICD-10-CM | POA: Diagnosis not present

## 2021-12-23 DIAGNOSIS — R202 Paresthesia of skin: Secondary | ICD-10-CM | POA: Diagnosis not present

## 2021-12-23 DIAGNOSIS — Z79899 Other long term (current) drug therapy: Secondary | ICD-10-CM | POA: Diagnosis not present

## 2021-12-23 DIAGNOSIS — E538 Deficiency of other specified B group vitamins: Secondary | ICD-10-CM | POA: Diagnosis not present

## 2021-12-23 DIAGNOSIS — R413 Other amnesia: Secondary | ICD-10-CM | POA: Diagnosis not present

## 2021-12-23 DIAGNOSIS — I639 Cerebral infarction, unspecified: Secondary | ICD-10-CM | POA: Diagnosis not present

## 2021-12-23 MED ORDER — HYDRALAZINE HCL 25 MG PO TABS: 25.0000 mg | ORAL_TABLET | Freq: Two times a day (BID) | ORAL | 1 refills | Status: DC

## 2021-12-23 NOTE — Telephone Encounter (Signed)
*  STAT* If patient is at the pharmacy, call can be transferred to refill team.   1. Which medications need to be refilled? (please list name of each medication and dose if known)   hydrALAZINE (APRESOLINE) 25 MG tablet    2. Which pharmacy/location (including street and city if local pharmacy) is medication to be sent to? [id number] [place], [place] - [address]. Do they need a 30 day or 90 day supply?  90 day    Pt has scheduled appt for 03/12/22. Please advise

## 2021-12-23 NOTE — Telephone Encounter (Signed)
Done

## 2021-12-29 ENCOUNTER — Other Ambulatory Visit (HOSPITAL_COMMUNITY): Payer: Self-pay | Admitting: Specialist

## 2021-12-29 ENCOUNTER — Other Ambulatory Visit: Payer: Self-pay | Admitting: Specialist

## 2021-12-29 DIAGNOSIS — I639 Cerebral infarction, unspecified: Secondary | ICD-10-CM

## 2021-12-30 ENCOUNTER — Other Ambulatory Visit (HOSPITAL_COMMUNITY): Payer: Self-pay | Admitting: Specialist

## 2021-12-30 ENCOUNTER — Other Ambulatory Visit: Payer: Self-pay | Admitting: Specialist

## 2021-12-30 DIAGNOSIS — I639 Cerebral infarction, unspecified: Secondary | ICD-10-CM

## 2021-12-30 DIAGNOSIS — I10 Essential (primary) hypertension: Secondary | ICD-10-CM

## 2021-12-30 DIAGNOSIS — Z8673 Personal history of transient ischemic attack (TIA), and cerebral infarction without residual deficits: Secondary | ICD-10-CM

## 2021-12-30 DIAGNOSIS — I679 Cerebrovascular disease, unspecified: Secondary | ICD-10-CM

## 2021-12-31 ENCOUNTER — Other Ambulatory Visit: Payer: Medicare Other

## 2021-12-31 ENCOUNTER — Ambulatory Visit (HOSPITAL_COMMUNITY): Admission: RE | Admit: 2021-12-31 | Payer: Medicare Other | Source: Ambulatory Visit

## 2022-01-01 ENCOUNTER — Other Ambulatory Visit: Payer: Medicare Other

## 2022-01-05 ENCOUNTER — Ambulatory Visit (HOSPITAL_COMMUNITY)
Admission: RE | Admit: 2022-01-05 | Discharge: 2022-01-05 | Disposition: A | Discharge status: Home / Self Care | Payer: Medicare Other | Source: Ambulatory Visit | Attending: Specialist | Admitting: Specialist

## 2022-01-05 DIAGNOSIS — I10 Essential (primary) hypertension: Secondary | ICD-10-CM | POA: Diagnosis not present

## 2022-01-05 LAB — ECHOCARDIOGRAM LIMITED BUBBLE STUDY: S' Lateral: 2.3 cm

## 2022-01-05 NOTE — Progress Notes (Signed)
*  PRELIMINARY RESULTS* Echocardiogram Limited 2D Echocardiogram has been performed.  Annette Sharp 01/05/2022, 3:55 PM

## 2022-01-05 NOTE — Addendum Note (Signed)
Encounter addended by: Elpidio Anis on: 01/05/2022 3:55 PM  Actions taken: Imaging Exam ended, Clinical Note Signed

## 2022-01-15 DIAGNOSIS — R413 Other amnesia: Secondary | ICD-10-CM | POA: Diagnosis not present

## 2022-01-15 DIAGNOSIS — R202 Paresthesia of skin: Secondary | ICD-10-CM | POA: Diagnosis not present

## 2022-01-15 DIAGNOSIS — I639 Cerebral infarction, unspecified: Secondary | ICD-10-CM | POA: Diagnosis not present

## 2022-01-15 DIAGNOSIS — R2689 Other abnormalities of gait and mobility: Secondary | ICD-10-CM | POA: Diagnosis not present

## 2022-01-19 DIAGNOSIS — R9431 Abnormal electrocardiogram [ECG] [EKG]: Secondary | ICD-10-CM | POA: Diagnosis not present

## 2022-01-19 DIAGNOSIS — I10 Essential (primary) hypertension: Secondary | ICD-10-CM | POA: Diagnosis not present

## 2022-01-19 DIAGNOSIS — R079 Chest pain, unspecified: Secondary | ICD-10-CM | POA: Diagnosis not present

## 2022-01-19 DIAGNOSIS — J449 Chronic obstructive pulmonary disease, unspecified: Secondary | ICD-10-CM | POA: Diagnosis not present

## 2022-01-19 DIAGNOSIS — I249 Acute ischemic heart disease, unspecified: Secondary | ICD-10-CM | POA: Diagnosis not present

## 2022-01-19 DIAGNOSIS — Z8673 Personal history of transient ischemic attack (TIA), and cerebral infarction without residual deficits: Secondary | ICD-10-CM | POA: Diagnosis not present

## 2022-01-19 DIAGNOSIS — R4182 Altered mental status, unspecified: Secondary | ICD-10-CM | POA: Diagnosis not present

## 2022-01-19 DIAGNOSIS — R69 Illness, unspecified: Secondary | ICD-10-CM | POA: Diagnosis not present

## 2022-01-19 DIAGNOSIS — E079 Disorder of thyroid, unspecified: Secondary | ICD-10-CM | POA: Diagnosis not present

## 2022-01-19 DIAGNOSIS — R479 Unspecified speech disturbances: Secondary | ICD-10-CM | POA: Diagnosis not present

## 2022-01-19 DIAGNOSIS — J323 Chronic sphenoidal sinusitis: Secondary | ICD-10-CM | POA: Diagnosis not present

## 2022-01-19 DIAGNOSIS — R260 Ataxic gait: Secondary | ICD-10-CM | POA: Diagnosis not present

## 2022-01-19 DIAGNOSIS — Z955 Presence of coronary angioplasty implant and graft: Secondary | ICD-10-CM | POA: Diagnosis not present

## 2022-01-19 DIAGNOSIS — R5381 Other malaise: Secondary | ICD-10-CM | POA: Diagnosis not present

## 2022-01-19 DIAGNOSIS — Z7902 Long term (current) use of antithrombotics/antiplatelets: Secondary | ICD-10-CM | POA: Diagnosis not present

## 2022-01-19 DIAGNOSIS — I2510 Atherosclerotic heart disease of native coronary artery without angina pectoris: Secondary | ICD-10-CM | POA: Diagnosis not present

## 2022-01-20 ENCOUNTER — Encounter (HOSPITAL_COMMUNITY): Payer: Self-pay

## 2022-01-21 ENCOUNTER — Other Ambulatory Visit: Payer: Self-pay

## 2022-01-21 ENCOUNTER — Inpatient Hospital Stay (HOSPITAL_COMMUNITY)
Admission: RE | Admit: 2022-01-21 | Discharge: 2022-01-23 | DRG: 282 | Disposition: A | Discharge status: Home / Self Care | Payer: Medicare Other | Attending: Internal Medicine | Admitting: Internal Medicine

## 2022-01-21 ENCOUNTER — Encounter (HOSPITAL_COMMUNITY): Payer: Self-pay | Admitting: Internal Medicine

## 2022-01-21 DIAGNOSIS — Z7982 Long term (current) use of aspirin: Secondary | ICD-10-CM

## 2022-01-21 DIAGNOSIS — Z96653 Presence of artificial knee joint, bilateral: Secondary | ICD-10-CM | POA: Diagnosis present

## 2022-01-21 DIAGNOSIS — Z90722 Acquired absence of ovaries, bilateral: Secondary | ICD-10-CM

## 2022-01-21 DIAGNOSIS — Z8673 Personal history of transient ischemic attack (TIA), and cerebral infarction without residual deficits: Secondary | ICD-10-CM | POA: Diagnosis not present

## 2022-01-21 DIAGNOSIS — Z79899 Other long term (current) drug therapy: Secondary | ICD-10-CM | POA: Diagnosis not present

## 2022-01-21 DIAGNOSIS — I249 Acute ischemic heart disease, unspecified: Secondary | ICD-10-CM

## 2022-01-21 DIAGNOSIS — F419 Anxiety disorder, unspecified: Secondary | ICD-10-CM | POA: Diagnosis present

## 2022-01-21 DIAGNOSIS — B379 Candidiasis, unspecified: Secondary | ICD-10-CM | POA: Diagnosis not present

## 2022-01-21 DIAGNOSIS — K219 Gastro-esophageal reflux disease without esophagitis: Secondary | ICD-10-CM | POA: Diagnosis present

## 2022-01-21 DIAGNOSIS — Z79890 Hormone replacement therapy (postmenopausal): Secondary | ICD-10-CM

## 2022-01-21 DIAGNOSIS — Z955 Presence of coronary angioplasty implant and graft: Secondary | ICD-10-CM | POA: Diagnosis not present

## 2022-01-21 DIAGNOSIS — I214 Non-ST elevation (NSTEMI) myocardial infarction: Secondary | ICD-10-CM | POA: Diagnosis not present

## 2022-01-21 DIAGNOSIS — Z881 Allergy status to other antibiotic agents status: Secondary | ICD-10-CM

## 2022-01-21 DIAGNOSIS — Z8711 Personal history of peptic ulcer disease: Secondary | ICD-10-CM

## 2022-01-21 DIAGNOSIS — E079 Disorder of thyroid, unspecified: Secondary | ICD-10-CM | POA: Diagnosis not present

## 2022-01-21 DIAGNOSIS — E785 Hyperlipidemia, unspecified: Secondary | ICD-10-CM | POA: Diagnosis not present

## 2022-01-21 DIAGNOSIS — I25110 Atherosclerotic heart disease of native coronary artery with unstable angina pectoris: Secondary | ICD-10-CM | POA: Diagnosis not present

## 2022-01-21 DIAGNOSIS — E039 Hypothyroidism, unspecified: Secondary | ICD-10-CM | POA: Diagnosis present

## 2022-01-21 DIAGNOSIS — R0789 Other chest pain: Secondary | ICD-10-CM | POA: Diagnosis present

## 2022-01-21 DIAGNOSIS — Z9079 Acquired absence of other genital organ(s): Secondary | ICD-10-CM

## 2022-01-21 DIAGNOSIS — Z90710 Acquired absence of both cervix and uterus: Secondary | ICD-10-CM | POA: Diagnosis not present

## 2022-01-21 DIAGNOSIS — G4733 Obstructive sleep apnea (adult) (pediatric): Secondary | ICD-10-CM | POA: Diagnosis present

## 2022-01-21 DIAGNOSIS — Z23 Encounter for immunization: Secondary | ICD-10-CM | POA: Diagnosis not present

## 2022-01-21 DIAGNOSIS — Z96612 Presence of left artificial shoulder joint: Secondary | ICD-10-CM | POA: Diagnosis present

## 2022-01-21 DIAGNOSIS — I2510 Atherosclerotic heart disease of native coronary artery without angina pectoris: Secondary | ICD-10-CM | POA: Diagnosis not present

## 2022-01-21 DIAGNOSIS — R079 Chest pain, unspecified: Secondary | ICD-10-CM | POA: Diagnosis not present

## 2022-01-21 DIAGNOSIS — I10 Essential (primary) hypertension: Secondary | ICD-10-CM | POA: Diagnosis present

## 2022-01-21 DIAGNOSIS — Z7902 Long term (current) use of antithrombotics/antiplatelets: Secondary | ICD-10-CM

## 2022-01-21 DIAGNOSIS — Z9049 Acquired absence of other specified parts of digestive tract: Secondary | ICD-10-CM | POA: Diagnosis not present

## 2022-01-21 DIAGNOSIS — I129 Hypertensive chronic kidney disease with stage 1 through stage 4 chronic kidney disease, or unspecified chronic kidney disease: Secondary | ICD-10-CM | POA: Diagnosis not present

## 2022-01-21 DIAGNOSIS — R519 Headache, unspecified: Secondary | ICD-10-CM | POA: Diagnosis present

## 2022-01-21 DIAGNOSIS — Z8249 Family history of ischemic heart disease and other diseases of the circulatory system: Secondary | ICD-10-CM | POA: Diagnosis not present

## 2022-01-21 DIAGNOSIS — Z9181 History of falling: Secondary | ICD-10-CM

## 2022-01-21 DIAGNOSIS — J449 Chronic obstructive pulmonary disease, unspecified: Secondary | ICD-10-CM | POA: Diagnosis present

## 2022-01-21 DIAGNOSIS — N183 Chronic kidney disease, stage 3 unspecified: Secondary | ICD-10-CM | POA: Diagnosis not present

## 2022-01-21 DIAGNOSIS — R260 Ataxic gait: Secondary | ICD-10-CM | POA: Diagnosis not present

## 2022-01-21 DIAGNOSIS — F32A Depression, unspecified: Secondary | ICD-10-CM | POA: Diagnosis present

## 2022-01-21 DIAGNOSIS — Z888 Allergy status to other drugs, medicaments and biological substances status: Secondary | ICD-10-CM

## 2022-01-21 LAB — CBC
HCT: 36.5 % (ref 36.0–46.0)
Hemoglobin: 12.7 g/dL (ref 12.0–15.0)
MCH: 32.3 pg (ref 26.0–34.0)
MCHC: 34.8 g/dL (ref 30.0–36.0)
MCV: 92.9 fL (ref 80.0–100.0)
Platelets: 186 10*3/uL (ref 150–400)
RBC: 3.93 MIL/uL (ref 3.87–5.11)
RDW: 12.5 % (ref 11.5–15.5)
WBC: 6.1 10*3/uL (ref 4.0–10.5)
nRBC: 0 % (ref 0.0–0.2)

## 2022-01-21 LAB — BASIC METABOLIC PANEL
Anion gap: 9 (ref 5–15)
BUN: 12 mg/dL (ref 8–23)
CO2: 25 mmol/L (ref 22–32)
Calcium: 9.3 mg/dL (ref 8.9–10.3)
Chloride: 103 mmol/L (ref 98–111)
Creatinine, Ser: 1.3 mg/dL — ABNORMAL HIGH (ref 0.44–1.00)
GFR, Estimated: 42 mL/min — ABNORMAL LOW (ref >60)
Glucose, Bld: 107 mg/dL — ABNORMAL HIGH (ref 70–99)
Potassium: 3.8 mmol/L (ref 3.5–5.1)
Sodium: 137 mmol/L (ref 135–145)

## 2022-01-21 LAB — HEMOGLOBIN A1C
Hgb A1c MFr Bld: 5.2 % (ref 4.8–5.6)
Mean Plasma Glucose: 102.54 mg/dL

## 2022-01-21 LAB — HEPARIN LEVEL (UNFRACTIONATED): Heparin Unfractionated: 0.15 IU/mL — ABNORMAL LOW (ref 0.30–0.70)

## 2022-01-21 LAB — TROPONIN I (HIGH SENSITIVITY)
Troponin I (High Sensitivity): 21 ng/L — ABNORMAL HIGH (ref <18)
Troponin I (High Sensitivity): 19 ng/L — ABNORMAL HIGH (ref <18)

## 2022-01-21 LAB — MAGNESIUM: Magnesium: 1.9 mg/dL (ref 1.7–2.4)

## 2022-01-21 MED ORDER — IPRATROPIUM-ALBUTEROL 0.5-2.5 (3) MG/3ML IN SOLN: 3.0000 mL | RESPIRATORY_TRACT | Status: DC | PRN

## 2022-01-21 MED ORDER — ASPIRIN 81 MG PO TBEC
81.0000 mg | DELAYED_RELEASE_TABLET | Freq: Every morning | ORAL | Status: DC
  Administered 2022-01-22 – 2022-01-23 (×2): 81 mg via ORAL
  Filled 2022-01-21 (×2): qty 1

## 2022-01-21 MED ORDER — ONDANSETRON HCL 4 MG/2ML IJ SOLN: 4.0000 mg | Freq: Four times a day (QID) | INTRAMUSCULAR | Status: DC | PRN

## 2022-01-21 MED ORDER — TRAZODONE HCL 50 MG PO TABS
50.0000 mg | ORAL_TABLET | Freq: Every evening | ORAL | Status: DC | PRN
  Administered 2022-01-21 – 2022-01-22 (×2): 50 mg via ORAL
  Filled 2022-01-21 (×2): qty 1

## 2022-01-21 MED ORDER — ISOSORBIDE MONONITRATE ER 60 MG PO TB24
90.0000 mg | ORAL_TABLET | Freq: Two times a day (BID) | ORAL | Status: DC
  Administered 2022-01-21 – 2022-01-23 (×4): 90 mg via ORAL
  Filled 2022-01-21 (×5): qty 1

## 2022-01-21 MED ORDER — BUPROPION HCL ER (SR) 150 MG PO TB12
150.0000 mg | ORAL_TABLET | Freq: Two times a day (BID) | ORAL | Status: DC
  Administered 2022-01-21 – 2022-01-23 (×4): 150 mg via ORAL
  Filled 2022-01-21 (×4): qty 1

## 2022-01-21 MED ORDER — NITROGLYCERIN 0.4 MG SL SUBL: 0.4000 mg | SUBLINGUAL_TABLET | SUBLINGUAL | Status: DC | PRN

## 2022-01-21 MED ORDER — HEPARIN (PORCINE) 25000 UT/250ML-% IV SOLN
950.0000 [IU]/h | INTRAVENOUS | Status: DC
  Administered 2022-01-21: 750 [IU]/h via INTRAVENOUS
  Administered 2022-01-22: 950 [IU]/h via INTRAVENOUS
  Filled 2022-01-21 (×3): qty 250

## 2022-01-21 MED ORDER — HYDRALAZINE HCL 20 MG/ML IJ SOLN: 10.0000 mg | INTRAMUSCULAR | Status: DC | PRN

## 2022-01-21 MED ORDER — GUAIFENESIN 100 MG/5ML PO LIQD: 5.0000 mL | ORAL | Status: DC | PRN

## 2022-01-21 MED ORDER — AMLODIPINE BESYLATE 10 MG PO TABS
10.0000 mg | ORAL_TABLET | Freq: Every morning | ORAL | Status: DC
  Administered 2022-01-22 – 2022-01-23 (×2): 10 mg via ORAL
  Filled 2022-01-21 (×2): qty 1

## 2022-01-21 MED ORDER — CLOPIDOGREL BISULFATE 75 MG PO TABS
75.0000 mg | ORAL_TABLET | Freq: Every morning | ORAL | Status: DC
  Administered 2022-01-22 – 2022-01-23 (×2): 75 mg via ORAL
  Filled 2022-01-21 (×2): qty 1

## 2022-01-21 MED ORDER — DONEPEZIL HCL 5 MG PO TABS
5.0000 mg | ORAL_TABLET | Freq: Every morning | ORAL | Status: DC
  Administered 2022-01-22 – 2022-01-23 (×2): 5 mg via ORAL
  Filled 2022-01-21 (×2): qty 1

## 2022-01-21 MED ORDER — THIAMINE MONONITRATE 100 MG PO TABS
100.0000 mg | ORAL_TABLET | Freq: Every day | ORAL | Status: DC
  Administered 2022-01-22 – 2022-01-23 (×2): 100 mg via ORAL
  Filled 2022-01-21 (×2): qty 1

## 2022-01-21 MED ORDER — LEVOTHYROXINE SODIUM 75 MCG PO TABS
75.0000 ug | ORAL_TABLET | Freq: Every day | ORAL | Status: DC
  Administered 2022-01-22 – 2022-01-23 (×2): 75 ug via ORAL
  Filled 2022-01-21 (×2): qty 1

## 2022-01-21 MED ORDER — METOPROLOL TARTRATE 5 MG/5ML IV SOLN: 5.0000 mg | INTRAVENOUS | Status: DC | PRN

## 2022-01-21 MED ORDER — INFLUENZA VAC A&B SA ADJ QUAD 0.5 ML IM PRSY
0.5000 mL | PREFILLED_SYRINGE | INTRAMUSCULAR | Status: AC
  Administered 2022-01-23: 0.5 mL via INTRAMUSCULAR
  Filled 2022-01-21: qty 0.5

## 2022-01-21 MED ORDER — ROSUVASTATIN CALCIUM 20 MG PO TABS
20.0000 mg | ORAL_TABLET | Freq: Every evening | ORAL | Status: DC
  Administered 2022-01-21 – 2022-01-22 (×2): 20 mg via ORAL
  Filled 2022-01-21 (×2): qty 1

## 2022-01-21 MED ORDER — ACETAMINOPHEN 325 MG PO TABS: 650.0000 mg | ORAL_TABLET | Freq: Four times a day (QID) | ORAL | Status: DC | PRN

## 2022-01-21 MED ORDER — PANTOPRAZOLE SODIUM 40 MG PO TBEC
40.0000 mg | DELAYED_RELEASE_TABLET | Freq: Every day | ORAL | Status: DC
  Administered 2022-01-22 – 2022-01-23 (×2): 40 mg via ORAL
  Filled 2022-01-21 (×2): qty 1

## 2022-01-21 MED ORDER — SENNOSIDES-DOCUSATE SODIUM 8.6-50 MG PO TABS
1.0000 | ORAL_TABLET | Freq: Every evening | ORAL | Status: DC | PRN
  Administered 2022-01-21 – 2022-01-22 (×2): 1 via ORAL
  Filled 2022-01-21 (×2): qty 1

## 2022-01-21 NOTE — H&P (Signed)
History and Physical    Annette Sharp HQI:[id number] DOB: January 25, 1941 DOA: 01/21/2022  PCP: Manon Hilding, MD Patient coming from: [facility]  Chief Complaint: ACS  HPI: Annette Sharp is a 81 y.o. female with medical history significant of COPD, CAD status post PCI, GERD, hypothyroidism, HTN, HLD comes to the hospital initially at [facility] with complaints of burning feeling and chest discomfort.  He is a poor historian regarding the events that happened therefore most of the history per husband at bedside.  Husband tells me that patient started having some burning type sensation going from her head to her abdomen area with some chest discomfort on Monday therefore EMS was called and patient was brought to the hospital.  While being admitted patient had elevated troponin trending as high as 234 and [facility] cardiology team was called and patient was transferred here for further work-up.  While waiting for transferred patient's symptoms resolved, she has been running on heparin drip and troponins down trended.  This morning at the outside facility was 63. During my evaluation patient does not have any symptoms at all and feels back to her baseline.  Husband does tell me that there undergoing some outpatient work-up as recent MRI in 2023 showed moderate chronic cerebellar infarct and follows with outpatient neurology.  Social history-denies any tobacco, alcohol and illicit drug use CODE STATUS-full   Review of Systems: As per HPI otherwise 10 point review of systems negative.  Review of Systems Otherwise negative except as per HPI, including: General: Denies fever, chills, night sweats or unintended weight loss. Resp: Denies cough, wheezing, shortness of breath. Cardiac: Denies chest pain, palpitations, orthopnea, paroxysmal nocturnal dyspnea. GI: Denies abdominal pain, nausea, vomiting, diarrhea or constipation GU: Denies dysuria, frequency, hesitancy or incontinence MS: Denies muscle  aches, joint pain or swelling Neuro: Denies headache, neurologic deficits (focal weakness, numbness, tingling), abnormal gait Psych: Denies anxiety, depression, SI/HI/AVH Skin: Denies new rashes or lesions ID: Denies sick contacts, exotic exposures, travel  Past Medical History:  Diagnosis Date   Anxiety    Back pain    Cerebrovascular disease    NONOBSTRUCTIVE CVA BY CAROTID DOPPLERS OCTOBER 2008   Chronic renal insufficiency    GFR 45 ML'S PER MINUTE   COPD (chronic obstructive pulmonary disease) ([place])    Coronary artery disease    a. s/p DES to RCA in 2007 b. patent stent by cath in 12/2014 with nonobstructive disease along LAD and RCA   Coronary atherosclerosis of native coronary artery    NEGATIVE LEXISCAN FOR ISCHEMIA MARCH 23,2010 NORMAL LV FUNCTTION   Depression    Dyspnea    with exertion   GERD (gastroesophageal reflux disease)    Headache    chronic headache from fall in December   Hyperlipidemia    Hypertension    Hypothyroidism    Other and unspecified angina pectoris    ANGINA   Overweight(278.02)    OBESITY   Peptic ulcer disease    Primary localized osteoarthritis of left knee    Sleep apnea    SEVERE OBSTRUCTIVE    cpap   Stroke ([place])    affected memory   Thyroid disease    TREATED HYPOTHYROIDISM    Past Surgical History:  Procedure Laterality Date   ABDOMINAL HYSTERECTOMY     BIOPSY  06/18/2021   Procedure: BIOPSY;  Surgeon: Rogene Houston, MD;  [facility];  Service: Endoscopy;;   CARDIAC CATHETERIZATION N/A 01/22/2015   Procedure: Right/Left Heart  Cath and Coronary Angiography;  Surgeon: Wellington Hampshire, MD;  Location: [place] [facility];  Service: Cardiovascular;  Laterality: N/A;   CARPAL TUNNEL RELEASE     CHOLECYSTECTOMY     COLONOSCOPY N/A 03/31/2018   Procedure: COLONOSCOPY;  Surgeon: Rogene Houston, MD;  [facility];  Service: Endoscopy;  Laterality: N/A;  2:45   COLONOSCOPY WITH PROPOFOL N/A 06/18/2021    Procedure: COLONOSCOPY WITH PROPOFOL;  Surgeon: Rogene Houston, MD;  [facility];  Service: Endoscopy;  Laterality: N/A;  11:00   JOINT REPLACEMENT     LEFT HEART CATH AND CORONARY ANGIOGRAPHY N/A 04/28/2018   Procedure: LEFT HEART CATH AND CORONARY ANGIOGRAPHY;  Surgeon: Belva Crome, MD;  Location: [place] [facility];  Service: Cardiovascular;  Laterality: N/A;   POLYPECTOMY  03/31/2018   Procedure: POLYPECTOMY;  Surgeon: Rogene Houston, MD;  [facility];  Service: Endoscopy;;  splen. flex, cecum   POLYPECTOMY  06/18/2021   Procedure: POLYPECTOMY;  Surgeon: Rogene Houston, MD;  [facility];  Service: Endoscopy;;   REPLACEMENT TOTAL KNEE Right 07/28/2010   ROTATOR CUFF REPAIR Right 07/08/2009   TOTAL ABDOMINAL HYSTERECTOMY W/ BILATERAL SALPINGOOPHORECTOMY     TOTAL KNEE ARTHROPLASTY Left 04/08/2015   Procedure: LEFT TOTAL KNEE ARTHROPLASTY;  Surgeon: Elsie Saas, MD;  Location: [place];  Service: Orthopedics;  Laterality: Left;   TOTAL SHOULDER ARTHROPLASTY Left 06/02/2017   Procedure: TOTAL SHOULDER ARTHROPLASTY;  Surgeon: Hiram Gash, MD;  Location: [place];  Service: Orthopedics;  Laterality: Left;    SOCIAL HISTORY:  reports that she has never smoked. She has never used smokeless tobacco. She reports that she does not drink alcohol and does not use drugs.  Allergies  Allergen Reactions   Ace Inhibitors Cough   Gemfibrozil Other (See Comments)    Muscle Aches   Moxifloxacin Rash   Ranexa [Ranolazine] Other (See Comments)    "NIGHTMARES"    FAMILY HISTORY: Family History  Problem Relation Age of Onset   CAD Other    Hypertension Other    Heart attack Other    Cancer Other    CAD Mother    Cancer Mother    CAD Father    Diabetes Sister    CAD Sister    Leukemia Brother      Prior to Admission medications   Medication Sig Start Date End Date Taking? Authorizing Provider  acyclovir (ZOVIRAX) 400 MG tablet Take 400 mg by mouth in the  morning.    [provider]  albuterol (PROVENTIL HFA;VENTOLIN HFA) 108 (90 Base) MCG/ACT inhaler Inhale 2 puffs into the lungs every 6 (six) hours as needed for wheezing or shortness of breath. 01/13/18   Strader, Fransisco Hertz, PA-C  amLODipine (NORVASC) 10 MG tablet Take 10 mg by mouth every morning. 05/04/15   [provider]  aspirin EC 81 MG tablet Take 81 mg by mouth in the morning. 04/03/18   Rogene Houston, MD  betamethasone dipropionate (DIPROLENE) 0.05 % cream Apply 1 application topically 2 (two) times daily as needed (atrophic vaginitis).     [provider]  cetirizine (ZYRTEC) 10 MG tablet Take 10 mg by mouth daily.    [provider]  Cholecalciferol (VITAMIN D3) 50 MCG (2000 UT) TABS Take 2,000 Units by mouth daily.    [provider]  citalopram (CELEXA) 20 MG tablet Take 20 mg by mouth every morning.    [provider]  clopidogrel (PLAVIX) 75 MG tablet Take 1 tablet (75 mg total) by mouth every morning. 06/19/21   Rehman, Mechele Dawley, MD  hydrALAZINE (APRESOLINE) 25 MG tablet Take 1 tablet (25 mg total) by mouth 2 (two) times daily. 12/23/21   Arnoldo Lenis, MD  hyoscyamine (LEVBID) 0.375 MG 12 hr tablet TAKE 1 TABLET (0.375 MG TOTAL) BY MOUTH DAILY. 07/21/21   Rogene Houston, MD  isosorbide mononitrate (IMDUR) 60 MG 24 hr tablet Take 1.5 tablets (90 mg total) by mouth 2 (two) times daily. 11/29/19   Verta Ellen., NP  levothyroxine (SYNTHROID) 75 MCG tablet Take 75 mcg by mouth daily. 12/03/20   [provider]  Melatonin 5 MG SUBL Take 5 mg by mouth at bedtime.     [provider]  nitroGLYCERIN (NITROSTAT) 0.4 MG SL tablet Place 1 tablet (0.4 mg total) under the tongue every 5 (five) minutes x 3 doses as needed for chest pain (if no relief after 3rd dose, proceed to the ED for an evaluation). 02/08/18 06/14/23  Herminio Commons, MD  omeprazole (PRILOSEC) 20 MG capsule Take 20 mg by mouth every evening.     [provider]  rosuvastatin (CRESTOR) 20 MG tablet TAKE 1 TABLET BY MOUTH EVERY DAY Patient taking differently: Take 20 mg by mouth every evening. 04/28/21   Arnoldo Lenis, MD  Vit C-Cholecalciferol-Rose Hip (VITAMIN C & D3/ROSE HIPS PO) Take 1 tablet by mouth daily.    [provider]  vitamin B-12 (CYANOCOBALAMIN) 1000 MCG tablet Take 1,000 mcg by mouth daily.    [provider]  Wheat Dextrin (BENEFIBER) CHEW Chew 1 tablet by mouth daily.    [provider]    Physical Exam: Vitals:   01/21/22 1617  BP: (!) 175/55  Resp: 14  Temp: 98.1 F (36.7 C)  TempSrc: Oral  SpO2: 99%  Weight: 66.2 kg  Height: '5\' 2"'$  (1.575 m)      Constitutional: NAD, calm, comfortable, very hard of hearing Eyes: PERRL, lids and conjunctivae normal ENMT: Mucous membranes are moist. Posterior pharynx clear of any exudate or lesions.Normal dentition.  Neck: normal, supple, no masses, no thyromegaly Respiratory: clear to auscultation bilaterally, no wheezing, no crackles. Normal respiratory effort. No accessory muscle use.  Cardiovascular: Regular rate and rhythm, no murmurs / rubs / gallops. No extremity edema. 2+ pedal pulses. No carotid bruits.  Abdomen: no tenderness, no masses palpated. No hepatosplenomegaly. Bowel sounds positive.  Musculoskeletal: no clubbing / cyanosis. No joint deformity upper and lower extremities. Good ROM, no contractures. Normal muscle tone.  Skin: no rashes, lesions, ulcers. No induration Neurologic: CN 2-12 grossly intact. Sensation intact, DTR normal. Strength 5/5 in all 4.  Psychiatric: Normal judgment and insight. Alert and oriented x 3. Normal mood.     Labs on Admission: I have personally reviewed following labs and imaging studies  CBC: No results for input(s): "WBC", "NEUTROABS", "HGB", "HCT", "MCV", "PLT" in the last 168 hours. Basic Metabolic Panel: No results for input(s): "NA", "K", "CL", "CO2", "GLUCOSE", "BUN",  "CREATININE", "CALCIUM", "MG", "PHOS" in the last 168 hours. GFR: CrCl cannot be calculated (Patient's most recent lab result is older than the maximum 21 days allowed.). Liver Function Tests: No results for input(s): "AST", "ALT", "ALKPHOS", "BILITOT", "PROT", "ALBUMIN" in the last 168 hours. No results for input(s): "LIPASE", "AMYLASE" in the last 168 hours. No results for input(s): "AMMONIA" in the last 168 hours. Coagulation Profile: No results for input(s): "INR", "PROTIME" in the  last 168 hours. Cardiac Enzymes: No results for input(s): "CKTOTAL", "CKMB", "CKMBINDEX", "TROPONINI" in the last 168 hours. BNP (last 3 results) No results for input(s): "PROBNP" in the last 8760 hours. HbA1C: No results for input(s): "HGBA1C" in the last 72 hours. CBG: No results for input(s): "GLUCAP" in the last 168 hours. Lipid Profile: No results for input(s): "CHOL", "HDL", "LDLCALC", "TRIG", "CHOLHDL", "LDLDIRECT" in the last 72 hours. Thyroid Function Tests: No results for input(s): "TSH", "T4TOTAL", "FREET4", "T3FREE", "THYROIDAB" in the last 72 hours. Anemia Panel: No results for input(s): "VITAMINB12", "FOLATE", "FERRITIN", "TIBC", "IRON", "RETICCTPCT" in the last 72 hours. Urine analysis:    Component Value Date/Time   COLORURINE YELLOW 05/25/2015 2044   APPEARANCEUR CLEAR 05/25/2015 2044   LABSPEC <1.005 (L) 05/25/2015 2044   PHURINE 6.0 05/25/2015 2044   GLUCOSEU NEGATIVE 05/25/2015 2044   HGBUR NEGATIVE 05/25/2015 2044   BILIRUBINUR NEGATIVE 05/25/2015 2044   KETONESUR NEGATIVE 05/25/2015 2044   PROTEINUR NEGATIVE 05/25/2015 2044   UROBILINOGEN 1.0 08/03/2014 0745   NITRITE NEGATIVE 05/25/2015 2044   LEUKOCYTESUR NEGATIVE 05/25/2015 2044   Sepsis Labs: !!!!!!!!!!!!!!!!!!!!!!!!!!!!!!!!!!!!!!!!!!!! '@LABRCNTIP'$ (procalcitonin:4,lacticidven:4) )No results found for this or any previous visit (from the past 240 hour(s)).   Radiological Exams on Admission: No results found.   All  images have been reviewed by me personally.  EKG: Independently reviewed.   Assessment/Plan Principal Problem:   ACS (acute coronary syndrome) (HCC) Active Problems:   Hyperlipidemia   Essential hypertension   Coronary artery disease involving native coronary artery   COPD (chronic obstructive pulmonary disease) (HCC)   NSTEMI (non-ST elevated myocardial infarction) ([place])    ACS/concerns of NSTEMI History of CAD status post PCI - Transferred here from [facility] for further work-up.  Troponins peaked at 234.  [facility] cardiology was contacted by their service.  Have sent left message to cardiology to consult on this patient.  Currently on cardiac diet, n.p.o. past midnight.  Continue heparin drip.  Troponins already trended down, this morning 63.  We will repeat troponins again here.  Check A1c, lipid panel.  Nitroglycerin as needed.  Recent echocardiogram in October 2023 showed preserved EF with no obvious wall motion abnormalities.  We will continue home Plavix  Essential hypertension - Norvasc, Imdur.  IV as needed ordered  Hyperlipidemia - Crestor  Hypothyroidism - Synthroid  COPD - As needed bronchodilators   DVT prophylaxis: Heparin drip Code Status: Full code Family Communication: None at bedside Consults called: Inbox message to cardiology sent Admission status: Observation telemetry  Status is: Observation The patient remains OBS appropriate and will d/c before 2 midnights.   Time Spent: 65 minutes.  >50% of the time was devoted to discussing the patients care, assessment, plan and disposition with other care givers along with counseling the patient about the risks and benefits of treatment.    Maili Shutters Arsenio Loader MD [facility]  If 7PM-7AM, please contact night-coverage   01/21/2022, 5:11 PM

## 2022-01-21 NOTE — Plan of Care (Signed)
  Problem: Education: Goal: Knowledge of General Education information will improve Description: Including pain rating scale, medication(s)/side effects and non-pharmacologic comfort measures Outcome: Progressing   Problem: Health Behavior/Discharge Planning: Goal: Ability to manage health-related needs will improve Outcome: Progressing   Problem: Clinical Measurements: Goal: Ability to maintain clinical measurements within normal limits will improve Outcome: Progressing   Problem: Clinical Measurements: Goal: Cardiovascular complication will be avoided Outcome: Progressing   

## 2022-01-21 NOTE — Consult Note (Signed)
Cardiology Consultation   Patient ID: Annette Sharp MRN: [id number]; DOB: 08/09/1940  Admit date: 01/21/2022 Date of Consult: 01/21/2022  PCP:  Manon Hilding, MD   [place] Prov[facility] Cardiologist:  Carlyle Dolly, MD        Patient Profile:   Annette Sharp is a 81 y.o. female with a hx of CAD (prior DES to RCA 2007), paroxysmal atrial tachycardia, HTN, OSA (not able to tolerate CPAP), HLD, prior CVA, GERD, anxiety, carotid artery disease, hypothyroidism, peptic ulcer disease, CKD stage 3 by labs (borderline a-b) who is being seen 01/21/2022 for the evaluation of elevated troponin at the request of Dr. Reesa Chew.  History of Present Illness:   Annette Sharp is followed by Dr. Carlyle Dolly with prior hx of DES to RCA in 2007.  She has had intermittent repeat testing over the years for recurrent chest pain and has not required repeat intervention. Last cath 03/2018 showed mild-moderate coronary disease but no focal obstruction, medical therapy recommended. She had a negative nuc in 01/2020 albeit with short runs of atrial tach. She had a recent echo ordered by another provider on 01/05/22 EF 60-65%, very focal wall motion abnormality in the mid inferoseptum and anteroseptum, normal RV, bubble study negative - she had recently been seeing a new neurologist and had had an MRI in 10/2021 showing prior lacunar infarcts. For several months now she has been having waxing/waning mental issues with some concern for dementia. She presented [facility] initially on 01/19/22 with vague chest burning. She is a poor historian. Husband waves his arms over his upper torso indicating this was the location of the discomfort. She had also had some confusion/delirium. Workup at outside hospital showed nonacute CT head, hsTroponins 123->188->234->63, WBC normal, Hgb normal, initial Cr 1.38 then 1.26 yesterday. CXR NAD. She is currently chest pain free but tearful. Case was discussed with cardiology fellow and patient  transferred to [facility] for cardiology evaluation. Internal medicine is admitting. She was started on Lovenox initially then transitioned to heparin due to renal dysfunction. Spoke with her husband who notes she has had waxing and waning issues with memory for the last 6 months. She's had decreased eating and thrush on nystatin suspension.     Past Medical History:  Diagnosis Date   Anxiety    Back pain    Cerebrovascular disease    NONOBSTRUCTIVE CVA BY CAROTID DOPPLERS OCTOBER 2008   Chronic renal insufficiency    GFR 45 ML'S PER MINUTE   COPD (chronic obstructive pulmonary disease) (HCC)    Coronary artery disease    a. s/p DES to RCA in 2007 b. patent stent by cath in 12/2014 with nonobstructive disease along LAD and RCA   Coronary atherosclerosis of native coronary artery    NEGATIVE LEXISCAN FOR ISCHEMIA MARCH 23,2010 NORMAL LV FUNCTTION   Depression    Dyspnea    with exertion   GERD (gastroesophageal reflux disease)    Headache    chronic headache from fall in December   Hyperlipidemia    Hypertension    Hypothyroidism    Other and unspecified angina pectoris    ANGINA   Overweight(278.02)    OBESITY   Peptic ulcer disease    Primary localized osteoarthritis of left knee    Sleep apnea    SEVERE OBSTRUCTIVE    cpap   Stroke ([place])    affected memory   Thyroid disease    TREATED HYPOTHYROIDISM    Past Surgical History:  Procedure  Laterality Date   ABDOMINAL HYSTERECTOMY     BIOPSY  06/18/2021   Procedure: BIOPSY;  Surgeon: Rogene Houston, MD;  [facility];  Service: Endoscopy;;   CARDIAC CATHETERIZATION N/A 01/22/2015   Procedure: Right/Left Heart Cath and Coronary Angiography;  Surgeon: Wellington Hampshire, MD;  Location: [place] [facility];  Service: Cardiovascular;  Laterality: N/A;   CARPAL TUNNEL RELEASE     CHOLECYSTECTOMY     COLONOSCOPY N/A 03/31/2018   Procedure: COLONOSCOPY;  Surgeon: Rogene Houston, MD;  [facility];  Service:  Endoscopy;  Laterality: N/A;  2:45   COLONOSCOPY WITH PROPOFOL N/A 06/18/2021   Procedure: COLONOSCOPY WITH PROPOFOL;  Surgeon: Rogene Houston, MD;  [facility];  Service: Endoscopy;  Laterality: N/A;  11:00   JOINT REPLACEMENT     LEFT HEART CATH AND CORONARY ANGIOGRAPHY N/A 04/28/2018   Procedure: LEFT HEART CATH AND CORONARY ANGIOGRAPHY;  Surgeon: Belva Crome, MD;  Location: [place] [facility];  Service: Cardiovascular;  Laterality: N/A;   POLYPECTOMY  03/31/2018   Procedure: POLYPECTOMY;  Surgeon: Rogene Houston, MD;  [facility];  Service: Endoscopy;;  splen. flex, cecum   POLYPECTOMY  06/18/2021   Procedure: POLYPECTOMY;  Surgeon: Rogene Houston, MD;  [facility];  Service: Endoscopy;;   REPLACEMENT TOTAL KNEE Right 07/28/2010   ROTATOR CUFF REPAIR Right 07/08/2009   TOTAL ABDOMINAL HYSTERECTOMY W/ BILATERAL SALPINGOOPHORECTOMY     TOTAL KNEE ARTHROPLASTY Left 04/08/2015   Procedure: LEFT TOTAL KNEE ARTHROPLASTY;  Surgeon: Elsie Saas, MD;  Location: [place];  Service: Orthopedics;  Laterality: Left;   TOTAL SHOULDER ARTHROPLASTY Left 06/02/2017   Procedure: TOTAL SHOULDER ARTHROPLASTY;  Surgeon: Hiram Gash, MD;  Location: [place];  Service: Orthopedics;  Laterality: Left;     Home Medications:  Prior to Admission medications   Medication Sig Start Date End Date Taking? Authorizing Provider  acyclovir (ZOVIRAX) 400 MG tablet Take 400 mg by mouth in the morning.   Yes [provider]  albuterol (PROVENTIL HFA;VENTOLIN HFA) 108 (90 Base) MCG/ACT inhaler Inhale 2 puffs into the lungs every 6 (six) hours as needed for wheezing or shortness of breath. 01/13/18  Yes Strader, [place] M, PA-C  amLODipine (NORVASC) 10 MG tablet Take 10 mg by mouth every morning. 05/04/15  Yes [provider]  aspirin EC 81 MG tablet Take 81 mg by mouth in the morning. 04/03/18  Yes Rehman, Mechele Dawley, MD  buPROPion (WELLBUTRIN SR) 150 MG 12 hr tablet Take 150 mg  by mouth 2 (two) times daily. 01/20/22  Yes [provider]  citalopram (CELEXA) 20 MG tablet Take 20 mg by mouth every morning.   Yes [provider]  clopidogrel (PLAVIX) 75 MG tablet Take 1 tablet (75 mg total) by mouth every morning. 06/19/21  Yes Rehman, Mechele Dawley, MD  donepezil (ARICEPT) 5 MG tablet Take 5 mg by mouth every morning. 01/15/22  Yes [provider]  hydrALAZINE (APRESOLINE) 25 MG tablet Take 1 tablet (25 mg total) by mouth 2 (two) times daily. 12/23/21  Yes BranchAlphonse Guild, MD  isosorbide mononitrate (IMDUR) 60 MG 24 hr tablet Take 1.5 tablets (90 mg total) by mouth 2 (two) times daily. 11/29/19  Yes Verta Ellen., NP  levothyroxine (SYNTHROID) 75 MCG tablet Take 75 mcg by mouth daily. 12/03/20  Yes [provider]  nitroGLYCERIN (NITROSTAT) 0.4 MG SL tablet Place 1 tablet (0.4 mg total)  under the tongue every 5 (five) minutes x 3 doses as needed for chest pain (if no relief after 3rd dose, proceed to the ED for an evaluation). 02/08/18 06/14/23 Yes Herminio Commons, MD  nystatin (MYCOSTATIN) 100000 UNIT/ML suspension Take 5 mLs by mouth 4 (four) times daily. 01/17/22  Yes [provider]  rosuvastatin (CRESTOR) 20 MG tablet TAKE 1 TABLET BY MOUTH EVERY DAY Patient taking differently: Take 20 mg by mouth every evening. 04/28/21  Yes BranchAlphonse Guild, MD  Thiamine HCl (VITAMIN B-1 PO) Take 1 tablet by mouth daily.   Yes [provider]  hyoscyamine (LEVBID) 0.375 MG 12 hr tablet TAKE 1 TABLET (0.375 MG TOTAL) BY MOUTH DAILY. Patient not taking: Reported on 01/21/2022 07/21/21   Rogene Houston, MD    Inpatient Medications: Scheduled Meds:  [START ON 01/22/2022] amLODipine  10 mg Oral q morning   [START ON 01/22/2022] aspirin EC  81 mg Oral q AM   buPROPion  150 mg Oral BID   [START ON 01/22/2022] clopidogrel  75 mg Oral q morning   [START ON 01/22/2022] donepezil  5 mg Oral q morning   [START ON 01/22/2022]  influenza vaccine adjuvanted  0.5 mL Intramuscular Tomorrow-1000   isosorbide mononitrate  90 mg Oral BID   [START ON 01/22/2022] levothyroxine  75 mcg Oral Daily   [START ON 01/22/2022] pantoprazole  40 mg Oral Daily   rosuvastatin  20 mg Oral QPM   [START ON 01/22/2022] thiamine  100 mg Oral Daily   Continuous Infusions:  heparin 750 Units/hr (01/21/22 1818)   PRN Meds: acetaminophen, guaiFENesin, hydrALAZINE, ipratropium-albuterol, metoprolol tartrate, nitroGLYCERIN, ondansetron (ZOFRAN) IV, senna-docusate, traZODone  Allergies:    Allergies  Allergen Reactions   Ace Inhibitors Cough   Gemfibrozil Other (See Comments)    Muscle Aches   Moxifloxacin Rash   Ranexa [Ranolazine] Other (See Comments)    "NIGHTMARES"    Social History:   Social History   Socioeconomic History   Marital status: Married    Spouse name: Not on file   Number of children: Not on file   Years of education: Not on file   Highest education level: Not on file  Occupational History   Not on file  Tobacco Use   Smoking status: Never   Smokeless tobacco: Never  Vaping Use   Vaping Use: Never used  Substance and Sexual Activity   Alcohol use: No    Alcohol/week: 0.0 standard drinks of alcohol   Drug use: No   Sexual activity: Never  Other Topics Concern   Not on file  Social History Narrative   Not on file   Social Determinants of Health   Financial Resource Strain: Not on file  Food Insecurity: Not on file  Transportation Needs: Not on file  Physical Activity: Not on file  Stress: Not on file  Social Connections: Not on file  Intimate Partner Violence: Not on file    Family History:   Family History  Problem Relation Age of Onset   CAD Other    Hypertension Other    Heart attack Other    Cancer Other    CAD Mother    Cancer Mother    CAD Father    Diabetes Sister    CAD Sister    Leukemia Brother      ROS:  Please see the history of present illness.  All other ROS  reviewed and negative.     Physical Exam/Data:   Vitals:  01/21/22 1617  BP: (!) 175/55  Resp: 14  Temp: 98.1 F (36.7 C)  TempSrc: Oral  SpO2: 99%  Weight: 66.2 kg  Height: '5\' 2"'$  (1.575 m)   No intake or output data in the 24 hours ending 01/21/22 1841    01/21/2022    4:17 PM 06/18/2021    9:25 AM 06/16/2021   11:10 AM  Last 3 Weights  Weight (lbs) 145 lb 15.1 oz 160 lb 0.9 oz 160 lb  Weight (kg) 66.2 kg 72.6 kg 72.576 kg     Body mass index is 26.69 kg/m.  General: Well developed, well nourished, in no acute distress.Crying Head: Normocephalic, atraumatic, sclera non-icteric, no xanthomas, nares are without discharge. Neck: Negative for carotid bruits. JVP not elevated. Lungs: Clear bilaterally to auscultation without wheezes, rales, or rhonchi. Breathing is unlabored. Heart: RRR S1 S2 without murmurs, rubs, or gallops.  Abdomen: Soft, non-tender, non-distended with normoactive bowel sounds. No rebound/guarding. Extremities: No clubbing or cyanosis. No edema. Distal pedal pulses are 2+ and equal bilaterally. Neuro: Alert and oriented X 3. Moves all extremities spontaneously. Psych:  Responds to questions appropriately with a normal affect.   EKG:  The EKG was personally reviewed and demonstrates:  sinus brady HR 59 otherwise no acute ST changes  Telemetry:  Telemetry was personally reviewed and demonstrates:  NSR  Relevant CV Studies: 2D echo 01/05/22 1. Cannot rule out very focal wall motion abnormality in the mid  inferoseptum and anteroseptum. Left ventricular ejection fraction, by  estimation, is 60 to 65%. The left ventricle has normal function. The left  ventricle has no regional wall motion  abnormalities.   2. Right ventricular systolic function is normal. The right ventricular  size is normal.   3. The mitral valve is normal in structure.   4. The aortic valve is tricuspid. Aortic valve sclerosis/calcification is  present, without any evidence of aortic  stenosis.   5. The inferior vena cava is normal in size with greater than 50%  respiratory variability, suggesting right atrial pressure of 3 mmHg.   6. Agitated saline contrast bubble study was negative, with no evidence  of any interatrial shunt.   Laboratory Data:  High Sensitivity Troponin:  No results for input(s): "TROPONINIHS" in the last 720 hours.   ChemistryNo results for input(s): "NA", "K", "CL", "CO2", "GLUCOSE", "BUN", "CREATININE", "CALCIUM", "MG", "GFRNONAA", "GFRAA", "ANIONGAP" in the last 168 hours.  No results for input(s): "PROT", "ALBUMIN", "AST", "ALT", "ALKPHOS", "BILITOT" in the last 168 hours. Lipids No results for input(s): "CHOL", "TRIG", "HDL", "LABVLDL", "LDLCALC", "CHOLHDL" in the last 168 hours.  HematologyNo results for input(s): "WBC", "RBC", "HGB", "HCT", "MCV", "MCH", "MCHC", "RDW", "PLT" in the last 168 hours. Thyroid No results for input(s): "TSH", "FREET4" in the last 168 hours.  BNPNo results for input(s): "BNP", "PROBNP" in the last 168 hours.  DDimer No results for input(s): "DDIMER" in the last 168 hours.   Radiology/Studies:  No results found.   Assessment and Plan:   1. Chest pain/possible NSTEMI, known CAD, HLD 2. Intermittent confusion/possible dementia 3. Paroxysmal atrial tachycardia 4. HTN 5. OSA (not able to tolerate CPAP) 6. Prior CVA   NSTEMI: very mild. ECG shows no ischemic changes. Her echo showed normal LV function, no WMA. Will opt to medically manage with 48 hrs heparin total, aspirin and plavix, crestor 20 mg daily. Will hold off on BB, noted to not tolerate with bradycardic rhythm in the past.  Discussed this with her husband in terms of  risk/benefit and he was in agreement to defer intervention/LHC. Recommend palliative care consult with c/f advanced dementia.   Time Spent Directly with Patient:  I have spent a total of 45 minutes with the patient reviewing hospital notes, telemetry, EKGs, labs and examining the patient  as well as establishing an assessment and plan that was discussed personally with the patient.  > 50% of time was spent in direct patient care.   Risk Assessment/Risk Scores:     TIMI Risk Score for Unstable Angina or Non-ST Elevation MI:   The patient's TIMI risk score is 5, which indicates a 26% risk of all cause mortality, new or recurrent myocardial infarction or need for urgent revascularization in the next 14 days.     For questions or updates, please contact [place] Please consult [email or web address] for contact info under    Signed, Charlie Pitter, PA-C  (note started, patient interviewed/seen by MD) 01/21/2022 6:41 PM

## 2022-01-21 NOTE — Progress Notes (Signed)
Patient's husband is at bedside, he says that he canceled her neuro appt that was supposed to be tomorrow. He wants to know if she can have her scans ( MRI, etc.) done here. I asked him to discuss this with the provider.

## 2022-01-21 NOTE — Progress Notes (Signed)
ANTICOAGULATION CONSULT NOTE - Initial Consult  Pharmacy Consult for Heparin Indication: chest pain/ACS  Allergies  Allergen Reactions   Ace Inhibitors Cough   Gemfibrozil Other (See Comments)    Muscle Aches   Moxifloxacin Rash   Ranexa [Ranolazine] Other (See Comments)    "NIGHTMARES"    Patient Measurements: Height: '5\' 2"'$  (157.5 cm) Weight: 66.2 kg (145 lb 15.1 oz) IBW/kg (Calculated) : 50.1 Heparin Dosing Weight: 64kg  Vital Signs: Temp: 98.1 F (36.7 C) (10/25 1617) Temp Source: Oral (10/25 1617) BP: 175/55 (10/25 1617)  Labs: No results for input(s): "HGB", "HCT", "PLT", "APTT", "LABPROT", "INR", "HEPARINUNFRC", "HEPRLOWMOCWT", "CREATININE", "CKTOTAL", "CKMB", "TROPONINIHS" in the last 72 hours.  CrCl cannot be calculated (Patient's most recent lab result is older than the maximum 21 days allowed.).   Medical History: Past Medical History:  Diagnosis Date   Anxiety    Back pain    Cerebrovascular disease    NONOBSTRUCTIVE CVA BY CAROTID DOPPLERS OCTOBER 2008   Chronic renal insufficiency    GFR 36 ML'S PER MINUTE   COPD (chronic obstructive pulmonary disease) (HCC)    Coronary artery disease    a. s/p DES to RCA in 2007 b. patent stent by cath in 12/2014 with nonobstructive disease along LAD and RCA   Coronary atherosclerosis of native coronary artery    NEGATIVE LEXISCAN FOR ISCHEMIA MARCH 23,2010 NORMAL LV FUNCTTION   Depression    Dyspnea    with exertion   GERD (gastroesophageal reflux disease)    Headache    chronic headache from fall in December   Hyperlipidemia    Hypertension    Hypothyroidism    Other and unspecified angina pectoris    ANGINA   Overweight(278.02)    OBESITY   Peptic ulcer disease    Primary localized osteoarthritis of left knee    Sleep apnea    SEVERE OBSTRUCTIVE    cpap   Stroke ([place])    affected memory   Thyroid disease    TREATED HYPOTHYROIDISM    Medications:  Scheduled:   [START ON 01/22/2022] amLODipine  10  mg Oral q morning   [START ON 01/22/2022] aspirin EC  81 mg Oral q AM   [START ON 01/22/2022] clopidogrel  75 mg Oral q morning   [START ON 01/22/2022] influenza vaccine adjuvanted  0.5 mL Intramuscular Tomorrow-1000   isosorbide mononitrate  90 mg Oral BID   levothyroxine  75 mcg Oral Daily   pantoprazole  40 mg Oral Daily   rosuvastatin  20 mg Oral QPM   Infusions:   Assessment: 89 YOF presenting with chest pain transferred from OSH for cardiology evaluation. Patient transferred on heparin running at 670 units/hr. No anticoagulation on med list - formal review in progress. Pharmacy consulted to start heparin.  Goal of Therapy:  Heparin level 0.3-0.7 units/ml Monitor platelets by anticoagulation protocol: Yes   Plan:  Increase heparin to 750 units/hr (12 units/kg/hr) Check 8hr heparin level at 0200 on 10/26 Daily heparin level and CBC Monitor for signs/symptoms of bleeding  Merrilee Jansky, PharmD Clinical Pharmacist 01/21/2022,5:25 PM

## 2022-01-22 ENCOUNTER — Ambulatory Visit (HOSPITAL_COMMUNITY): Payer: Medicare Other

## 2022-01-22 ENCOUNTER — Encounter (HOSPITAL_COMMUNITY): Payer: Self-pay

## 2022-01-22 DIAGNOSIS — E785 Hyperlipidemia, unspecified: Secondary | ICD-10-CM | POA: Diagnosis present

## 2022-01-22 DIAGNOSIS — N183 Chronic kidney disease, stage 3 unspecified: Secondary | ICD-10-CM | POA: Diagnosis present

## 2022-01-22 DIAGNOSIS — Z23 Encounter for immunization: Secondary | ICD-10-CM | POA: Diagnosis present

## 2022-01-22 DIAGNOSIS — Z96653 Presence of artificial knee joint, bilateral: Secondary | ICD-10-CM | POA: Diagnosis present

## 2022-01-22 DIAGNOSIS — I129 Hypertensive chronic kidney disease with stage 1 through stage 4 chronic kidney disease, or unspecified chronic kidney disease: Secondary | ICD-10-CM | POA: Diagnosis present

## 2022-01-22 DIAGNOSIS — I10 Essential (primary) hypertension: Secondary | ICD-10-CM | POA: Diagnosis not present

## 2022-01-22 DIAGNOSIS — Z8673 Personal history of transient ischemic attack (TIA), and cerebral infarction without residual deficits: Secondary | ICD-10-CM | POA: Diagnosis not present

## 2022-01-22 DIAGNOSIS — J449 Chronic obstructive pulmonary disease, unspecified: Secondary | ICD-10-CM | POA: Diagnosis present

## 2022-01-22 DIAGNOSIS — K219 Gastro-esophageal reflux disease without esophagitis: Secondary | ICD-10-CM | POA: Diagnosis present

## 2022-01-22 DIAGNOSIS — R519 Headache, unspecified: Secondary | ICD-10-CM | POA: Diagnosis present

## 2022-01-22 DIAGNOSIS — I249 Acute ischemic heart disease, unspecified: Secondary | ICD-10-CM | POA: Diagnosis not present

## 2022-01-22 DIAGNOSIS — I2510 Atherosclerotic heart disease of native coronary artery without angina pectoris: Secondary | ICD-10-CM | POA: Diagnosis present

## 2022-01-22 DIAGNOSIS — Z79899 Other long term (current) drug therapy: Secondary | ICD-10-CM | POA: Diagnosis not present

## 2022-01-22 DIAGNOSIS — F32A Depression, unspecified: Secondary | ICD-10-CM | POA: Diagnosis present

## 2022-01-22 DIAGNOSIS — Z955 Presence of coronary angioplasty implant and graft: Secondary | ICD-10-CM | POA: Diagnosis not present

## 2022-01-22 DIAGNOSIS — R0789 Other chest pain: Secondary | ICD-10-CM | POA: Diagnosis present

## 2022-01-22 DIAGNOSIS — I25110 Atherosclerotic heart disease of native coronary artery with unstable angina pectoris: Secondary | ICD-10-CM | POA: Diagnosis not present

## 2022-01-22 DIAGNOSIS — Z9049 Acquired absence of other specified parts of digestive tract: Secondary | ICD-10-CM | POA: Diagnosis not present

## 2022-01-22 DIAGNOSIS — B379 Candidiasis, unspecified: Secondary | ICD-10-CM | POA: Diagnosis present

## 2022-01-22 DIAGNOSIS — Z8249 Family history of ischemic heart disease and other diseases of the circulatory system: Secondary | ICD-10-CM | POA: Diagnosis not present

## 2022-01-22 DIAGNOSIS — Z90710 Acquired absence of both cervix and uterus: Secondary | ICD-10-CM | POA: Diagnosis not present

## 2022-01-22 DIAGNOSIS — I214 Non-ST elevation (NSTEMI) myocardial infarction: Secondary | ICD-10-CM | POA: Diagnosis present

## 2022-01-22 DIAGNOSIS — E039 Hypothyroidism, unspecified: Secondary | ICD-10-CM | POA: Diagnosis present

## 2022-01-22 DIAGNOSIS — Z96612 Presence of left artificial shoulder joint: Secondary | ICD-10-CM | POA: Diagnosis present

## 2022-01-22 DIAGNOSIS — F419 Anxiety disorder, unspecified: Secondary | ICD-10-CM | POA: Diagnosis present

## 2022-01-22 DIAGNOSIS — G4733 Obstructive sleep apnea (adult) (pediatric): Secondary | ICD-10-CM | POA: Diagnosis present

## 2022-01-22 DIAGNOSIS — Z9181 History of falling: Secondary | ICD-10-CM | POA: Diagnosis not present

## 2022-01-22 DIAGNOSIS — Z8711 Personal history of peptic ulcer disease: Secondary | ICD-10-CM | POA: Diagnosis not present

## 2022-01-22 LAB — LIPID PANEL
Cholesterol: 141 mg/dL (ref 0–200)
HDL: 67 mg/dL (ref >40)
LDL Cholesterol: 66 mg/dL (ref 0–99)
Total CHOL/HDL Ratio: 2.1 RATIO
Triglycerides: 38 mg/dL (ref <150)
VLDL: 8 mg/dL (ref 0–40)

## 2022-01-22 LAB — CBC
HCT: 35.3 % — ABNORMAL LOW (ref 36.0–46.0)
Hemoglobin: 12.4 g/dL (ref 12.0–15.0)
MCH: 33.2 pg (ref 26.0–34.0)
MCHC: 35.1 g/dL (ref 30.0–36.0)
MCV: 94.6 fL (ref 80.0–100.0)
Platelets: 173 10*3/uL (ref 150–400)
RBC: 3.73 MIL/uL — ABNORMAL LOW (ref 3.87–5.11)
RDW: 12.7 % (ref 11.5–15.5)
WBC: 7.1 10*3/uL (ref 4.0–10.5)
nRBC: 0 % (ref 0.0–0.2)

## 2022-01-22 LAB — BASIC METABOLIC PANEL
Anion gap: 7 (ref 5–15)
BUN: 13 mg/dL (ref 8–23)
CO2: 26 mmol/L (ref 22–32)
Calcium: 9.3 mg/dL (ref 8.9–10.3)
Chloride: 103 mmol/L (ref 98–111)
Creatinine, Ser: 1.26 mg/dL — ABNORMAL HIGH (ref 0.44–1.00)
GFR, Estimated: 43 mL/min — ABNORMAL LOW (ref >60)
Glucose, Bld: 108 mg/dL — ABNORMAL HIGH (ref 70–99)
Potassium: 3.9 mmol/L (ref 3.5–5.1)
Sodium: 136 mmol/L (ref 135–145)

## 2022-01-22 LAB — GLUCOSE, CAPILLARY: Glucose-Capillary: 101 mg/dL — ABNORMAL HIGH (ref 70–99)

## 2022-01-22 LAB — HEPARIN LEVEL (UNFRACTIONATED)
Heparin Unfractionated: 0.15 IU/mL — ABNORMAL LOW (ref 0.30–0.70)
Heparin Unfractionated: 0.52 IU/mL (ref 0.30–0.70)

## 2022-01-22 LAB — MAGNESIUM: Magnesium: 2.1 mg/dL (ref 1.7–2.4)

## 2022-01-22 LAB — TSH: TSH: 1.823 u[IU]/mL (ref 0.350–4.500)

## 2022-01-22 NOTE — TOC Progression Note (Signed)
Transition of Care [facility]) - Progression Note    Patient Details  Name: Annette Sharp MRN: [id number] Date of Birth: 06-22-1940  [facility] CM/SW Contact  Zenon Mayo, RN Phone Number: 01/22/2022, 4:42 PM  Clinical Narrative:    From home with spouse, ACS , hep drip.  For poss dc tomorrow per spouse, he will transport her home at dc. TOC following.        Expected Discharge Plan and Services                                                 Social Determinants of Health (SDOH) Interventions    Readmission Risk Interventions     No data to display

## 2022-01-22 NOTE — Progress Notes (Signed)
PROGRESS NOTE    Annette Sharp  ZTI:[id number] DOB: 1940-04-05 DOA: 01/21/2022 PCP: Manon Hilding, MD   Brief Narrative:  81 y.o. female with medical history significant of COPD, CAD status post PCI, GERD, hypothyroidism, HTN, HLD comes to the hospital initially at [facility] with complaints of burning feeling and chest discomfort.  He is a poor historian regarding the events that happened therefore most of the history per husband at bedside.  Husband tells me that patient started having some burning type sensation going from her head to her abdomen area with some chest discomfort on Monday therefore EMS was called and patient was brought to the hospital.  While being admitted patient had elevated troponin trending as high as 234 and [facility] cardiology team was called and patient was transferred here for further work-up.  Patient was seen by cardiology team here who recommended 48 hours of IV heparin.   Assessment & Plan:  Principal Problem:   ACS (acute coronary syndrome) (HCC) Active Problems:   Hyperlipidemia   Essential hypertension   Coronary artery disease involving native coronary artery   COPD (chronic obstructive pulmonary disease) (HCC)   NSTEMI (non-ST elevated myocardial infarction) ([place])     ACS/concerns of NSTEMI History of CAD status post PCI - Transferred here from [facility] for further work-up.  Troponins peaked at 234.  Patient seen by cardiology service who is recommending 48 hours of IV heparin, adding aspirin, continue statin. Hemoglobin A1c 5.2, LDL 66 nitroglycerin as needed.  Recent echocardiogram in October 2023 showed preserved EF with no obvious wall motion abnormalities.  We will continue home Plavix Check TSH   Essential hypertension - Norvasc, Imdur.  IV as needed ordered   Hyperlipidemia - Crestor   Hypothyroidism - Synthroid   COPD - As needed bronchodilators     DVT prophylaxis: Heparin drip Code Status: Full code Family Communication:  Husband at bedside Maintain hospital stay for another 24 hours to complete total of 48 hours of IV heparin    Subjective: Feels much better, no complaints   Examination:  General exam: Appears calm and comfortable  Respiratory system: Clear to auscultation. Respiratory effort normal. Cardiovascular system: S1 & S2 heard, RRR. No JVD, murmurs, rubs, gallops or clicks. No pedal edema. Gastrointestinal system: Abdomen is nondistended, soft and nontender. No organomegaly or masses felt. Normal bowel sounds heard. Central nervous system: Alert and oriented. No focal neurological deficits. Extremities: Symmetric 5 x 5 power. Skin: No rashes, lesions or ulcers Psychiatry: Judgement and insight appear normal. Mood & affect appropriate.     Objective: Vitals:   01/22/22 0357 01/22/22 0400 01/22/22 0743 01/22/22 0900  BP:  129/62 (!) 140/54 (!) 132/51  Pulse:  (!) 51    Resp: 18  14   Temp:  97.8 F (36.6 C) (!) 97.2 F (36.2 C)   TempSrc:  Oral Oral   SpO2: 97%  100%   Weight:  66 kg    Height:        Intake/Output Summary (Last 24 hours) at 01/22/2022 1007 Last data filed at 01/22/2022 0900 Gross per 24 hour  Intake 1052.84 ml  Output 700 ml  Net 352.84 ml   Filed Weights   01/21/22 1617 01/22/22 0400  Weight: 66.2 kg 66 kg     Data Reviewed:   CBC: Recent Labs  Lab 01/21/22 1857 01/22/22 0045  WBC 6.1 7.1  HGB 12.7 12.4  HCT 36.5 35.3*  MCV 92.9 94.6  PLT 186 173  Basic Metabolic Panel: Recent Labs  Lab 01/21/22 1857 01/22/22 0045  NA 137 136  K 3.8 3.9  CL 103 103  CO2 25 26  GLUCOSE 107* 108*  BUN 12 13  CREATININE 1.30* 1.26*  CALCIUM 9.3 9.3  MG 1.9 2.1   GFR: Estimated Creatinine Clearance: 32.3 mL/min (A) (by C-G formula based on SCr of 1.26 mg/dL (H)). Liver Function Tests: No results for input(s): "AST", "ALT", "ALKPHOS", "BILITOT", "PROT", "ALBUMIN" in the last 168 hours. No results for input(s): "LIPASE", "AMYLASE" in the last 168  hours. No results for input(s): "AMMONIA" in the last 168 hours. Coagulation Profile: No results for input(s): "INR", "PROTIME" in the last 168 hours. Cardiac Enzymes: No results for input(s): "CKTOTAL", "CKMB", "CKMBINDEX", "TROPONINI" in the last 168 hours. BNP (last 3 results) No results for input(s): "PROBNP" in the last 8760 hours. HbA1C: Recent Labs    01/21/22 1901  HGBA1C 5.2   CBG: Recent Labs  Lab 01/22/22 0547  GLUCAP 101*   Lipid Profile: Recent Labs    01/22/22 0045  CHOL 141  HDL 67  LDLCALC 66  TRIG 38  CHOLHDL 2.1   Thyroid Function Tests: No results for input(s): "TSH", "T4TOTAL", "FREET4", "T3FREE", "THYROIDAB" in the last 72 hours. Anemia Panel: No results for input(s): "VITAMINB12", "FOLATE", "FERRITIN", "TIBC", "IRON", "RETICCTPCT" in the last 72 hours. Sepsis Labs: No results for input(s): "PROCALCITON", "LATICACIDVEN" in the last 168 hours.  No results found for this or any previous visit (from the past 240 hour(s)).       Radiology Studies: No results found.      Scheduled Meds:  amLODipine  10 mg Oral q morning   aspirin EC  81 mg Oral q AM   buPROPion  150 mg Oral BID   clopidogrel  75 mg Oral q morning   donepezil  5 mg Oral q morning   influenza vaccine adjuvanted  0.5 mL Intramuscular Tomorrow-1000   isosorbide mononitrate  90 mg Oral BID   levothyroxine  75 mcg Oral Daily   pantoprazole  40 mg Oral Daily   rosuvastatin  20 mg Oral QPM   thiamine  100 mg Oral Daily   Continuous Infusions:  heparin 950 Units/hr (01/22/22 0630)     LOS: 0 days   Time spent= 35 mins    Shuayb Schepers Arsenio Loader, MD [facility]  If 7PM-7AM, please contact night-coverage  01/22/2022, 10:07 AM

## 2022-01-22 NOTE — Progress Notes (Signed)
DAILY PROGRESS NOTE   Patient Name: Annette Sharp Date of Encounter: 01/22/2022 Cardiologist: Carlyle Dolly, MD  Chief Complaint   No chest pain  Patient Profile   Annette Sharp is a 81 y.o. female with a hx of CAD (prior DES to RCA 2007), paroxysmal atrial tachycardia, HTN, OSA (not able to tolerate CPAP), HLD, prior CVA, GERD, anxiety, carotid artery disease, hypothyroidism, peptic ulcer disease, CKD stage 3 by labs (borderline a-b) who is being seen 01/21/2022 for the evaluation of elevated troponin at the request of Dr. Reesa Chew.  Subjective   No chest pain overnight- medical therapy was recommended yesterday given co-morbidities and minimal troponin elevation. Plan is for 48 hrs IV Heparin.  Objective   Vitals:   01/22/22 0357 01/22/22 0400 01/22/22 0743 01/22/22 0900  BP:  129/62 (!) 140/54 (!) 132/51  Pulse:  (!) 51    Resp: 18  14   Temp:  97.8 F (36.6 C) (!) 97.2 F (36.2 C)   TempSrc:  Oral Oral   SpO2: 97%  100%   Weight:  66 kg    Height:        Intake/Output Summary (Last 24 hours) at 01/22/2022 1016 Last data filed at 01/22/2022 0900 Gross per 24 hour  Intake 1052.84 ml  Output 700 ml  Net 352.84 ml   Filed Weights   01/21/22 1617 01/22/22 0400  Weight: 66.2 kg 66 kg    Physical Exam   General appearance: alert and no distress Neck: no carotid bruit, no JVD, and thyroid not enlarged, symmetric, no tenderness/mass/nodules Lungs: clear to auscultation bilaterally Heart: regular rate and rhythm, S1, S2 normal, no murmur, click, rub or gallop Abdomen: soft, non-tender; bowel sounds normal; no masses,  no organomegaly Extremities: extremities normal, atraumatic, no cyanosis or edema Pulses: 2+ and symmetric Skin: Skin color, texture, turgor normal. No rashes or lesions Neurologic: Grossly normal Psych: Pleasant  Inpatient Medications    Scheduled Meds:  amLODipine  10 mg Oral q morning   aspirin EC  81 mg Oral q AM   buPROPion  150 mg  Oral BID   clopidogrel  75 mg Oral q morning   donepezil  5 mg Oral q morning   influenza vaccine adjuvanted  0.5 mL Intramuscular Tomorrow-1000   isosorbide mononitrate  90 mg Oral BID   levothyroxine  75 mcg Oral Daily   pantoprazole  40 mg Oral Daily   rosuvastatin  20 mg Oral QPM   thiamine  100 mg Oral Daily    Continuous Infusions:  heparin 950 Units/hr (01/22/22 0630)    PRN Meds: acetaminophen, guaiFENesin, hydrALAZINE, ipratropium-albuterol, metoprolol tartrate, nitroGLYCERIN, ondansetron (ZOFRAN) IV, senna-docusate, traZODone   Labs   Results for orders placed or performed during the hospital encounter of 01/21/22 (from the past 48 hour(s))  Troponin I (High Sensitivity)     Status: Abnormal   Collection Time: 01/21/22  6:57 PM  Result Value Ref Range   Troponin I (High Sensitivity) 21 (H) <18 ng/L    Comment: (NOTE) Elevated high sensitivity troponin I (hsTnI) values and significant  changes across serial measurements may suggest ACS but many other  chronic and acute conditions are known to elevate hsTnI results.  Refer to the "Links" section for chest pain algorithms and additional  guidance. Performed at [facility], [place] [address], [place], [place] [address]   Basic metabolic panel     Status: Abnormal   Collection Time: 01/21/22  6:57 PM  Result Value Ref Range   Sodium 137 135 - 145 mmol/L   Potassium 3.8 3.5 - 5.1 mmol/L   Chloride 103 98 - 111 mmol/L   CO2 25 22 - 32 mmol/L   Glucose, Bld 107 (H) 70 - 99 mg/dL    Comment: Glucose reference range applies only to samples taken after fasting for at least 8 hours.   BUN 12 8 - 23 mg/dL   Creatinine, Ser 1.30 (H) 0.44 - 1.00 mg/dL   Calcium 9.3 8.9 - 10.3 mg/dL   GFR, Estimated 42 (L) >60 mL/min    Comment: (NOTE) Calculated using the CKD-EPI Creatinine Equation (2021)    Anion gap 9 5 - 15    Comment: Performed at [facility] [address], [place], [place] [address]  Magnesium      Status: None   Collection Time: 01/21/22  6:57 PM  Result Value Ref Range   Magnesium 1.9 1.7 - 2.4 mg/dL    Comment: Performed at [facility], [place] [address] [place] [address]  CBC     Status: None   Collection Time: 01/21/22  6:57 PM  Result Value Ref Range   WBC 6.1 4.0 - 10.5 K/uL   RBC 3.93 3.87 - 5.11 MIL/uL   Hemoglobin 12.7 12.0 - 15.0 g/dL   HCT 36.5 36.0 - 46.0 %   MCV 92.9 80.0 - 100.0 fL   MCH 32.3 26.0 - 34.0 pg   MCHC 34.8 30.0 - 36.0 g/dL   RDW 12.5 11.5 - 15.5 %   Platelets 186 150 - 400 K/uL   nRBC 0.0 0.0 - 0.2 %    Comment: Performed at [facility], [place] [address], [place], [place] [address]  Hemoglobin A1c     Status: None   Collection Time: 01/21/22  7:01 PM  Result Value Ref Range   Hgb A1c MFr Bld 5.2 4.8 - 5.6 %    Comment: (NOTE) Pre diabetes:          5.7%-6.4%  Diabetes:              >6.4%  Glycemic control for   <7.0% adults with diabetes    Mean Plasma Glucose 102.54 mg/dL    Comment: Performed at [facility] [address] [place], [place] [address]  Heparin level (unfractionated)     Status: Abnormal   Collection Time: 01/21/22  9:29 PM  Result Value Ref Range   Heparin Unfractionated 0.15 (L) 0.30 - 0.70 IU/mL    Comment: (NOTE) The clinical reportable range upper limit is being lowered to >1.10 to align with the FDA approved guidance for the current laboratory assay.  If heparin results are below expected values, and patient dosage has  been confirmed, suggest follow up testing of antithrombin III levels. Performed at [facility], [place] [address] [place], [place] [address]   Troponin I (High Sensitivity)     Status: Abnormal   Collection Time: 01/21/22  9:29 PM  Result Value Ref Range   Troponin I (High Sensitivity) 19 (H) <18 ng/L    Comment: (NOTE) Elevated high sensitivity troponin I (hsTnI) values and significant  changes across serial measurements may suggest ACS but many other  chronic  and acute conditions are known to elevate hsTnI results.  Refer to the "Links" section for chest pain algorithms and additional  guidance. Performed at [facility], [place] [address] [place],  [address]  Basic metabolic panel     Status: Abnormal   Collection Time: 01/22/22 12:45 AM  Result Value Ref Range   Sodium 136 135 - 145 mmol/L   Potassium 3.9 3.5 - 5.1 mmol/L   Chloride 103 98 - 111 mmol/L   CO2 26 22 - 32 mmol/L   Glucose, Bld 108 (H) 70 - 99 mg/dL    Comment: Glucose reference range applies only to samples taken after fasting for at least 8 hours.   BUN 13 8 - 23 mg/dL   Creatinine, Ser 1.26 (H) 0.44 - 1.00 mg/dL   Calcium 9.3 8.9 - 10.3 mg/dL   GFR, Estimated 43 (L) >60 mL/min    Comment: (NOTE) Calculated using the CKD-EPI Creatinine Equation (2021)    Anion gap 7 5 - 15    Comment: Performed at [facility] [address] [place], [place] [address]  CBC     Status: Abnormal   Collection Time: 01/22/22 12:45 AM  Result Value Ref Range   WBC 7.1 4.0 - 10.5 K/uL   RBC 3.73 (L) 3.87 - 5.11 MIL/uL   Hemoglobin 12.4 12.0 - 15.0 g/dL   HCT 35.3 (L) 36.0 - 46.0 %   MCV 94.6 80.0 - 100.0 fL   MCH 33.2 26.0 - 34.0 pg   MCHC 35.1 30.0 - 36.0 g/dL   RDW 12.7 11.5 - 15.5 %   Platelets 173 150 - 400 K/uL   nRBC 0.0 0.0 - 0.2 %    Comment: Performed at [facility], [place] [address], [place], [place] [address]  Magnesium     Status: None   Collection Time: 01/22/22 12:45 AM  Result Value Ref Range   Magnesium 2.1 1.7 - 2.4 mg/dL    Comment: Performed at [facility] [address], [place],  [address]  Lipid panel     Status: None   Collection Time: 01/22/22 12:45 AM  Result Value Ref Range   Cholesterol 141 0 - 200 mg/dL   Triglycerides 38 <150 mg/dL   HDL 67 >40 mg/dL   Total CHOL/HDL Ratio 2.1 RATIO   VLDL 8 0 - 40 mg/dL   LDL Cholesterol 66 0 - 99 mg/dL    Comment:        Total Cholesterol/HDL:CHD Risk Coronary Heart  Disease Risk Table                     Men   Women  1/2 Average Risk   3.4   3.3  Average Risk       5.0   4.4  2 X Average Risk   9.6   7.1  3 X Average Risk  23.4   11.0        Use the calculated Patient Ratio above and the CHD Risk Table to determine the patient's CHD Risk.        ATP III CLASSIFICATION (LDL):  <100     mg/dL   Optimal  100-129  mg/dL   Near or Above                    Optimal  130-159  mg/dL   Borderline  160-189  mg/dL   High  >190     mg/dL   Very High Performed at [facility] [address], [place], [place] [address]   Heparin level (unfractionated)     Status: Abnormal   Collection Time: 01/22/22  1:42 AM  Result Value Ref Range   Heparin Unfractionated 0.15 (L) 0.30 - 0.70 IU/mL    Comment: (NOTE) The clinical reportable range upper limit is being lowered to >1.10 to align with the FDA approved guidance for the current laboratory assay.  If heparin results are below expected values, and patient dosage has  been confirmed, suggest follow up testing of antithrombin III levels. Performed at [facility], [place] [address], [place], [place] [address]   Glucose, capillary     Status: Abnormal   Collection Time: 01/22/22  5:47 AM  Result Value Ref Range   Glucose-Capillary 101 (H) 70 - 99 mg/dL    Comment: Glucose reference range applies only to samples taken after fasting for at least 8 hours.    ECG   N/A  Telemetry   Sinus rhythm - Personally Reviewed  Radiology    No results found.  Cardiac Studies   N/A  Assessment   Principal Problem:   ACS (acute coronary syndrome) (HCC) Active Problems:   Hyperlipidemia   Essential hypertension   Coronary artery disease involving native coronary artery   COPD (chronic obstructive pulmonary disease) (HCC)   NSTEMI (non-ST elevated myocardial infarction) ([place])   Plan   Mild troponin elevation - no further chest pain. Plan medical therapy - continue heparin until tomorrow-  will adjust anti-anginal medications.   Time Spent Directly with Patient:  I have spent a total of 25 minutes with the patient reviewing hospital notes, telemetry, EKGs, labs and examining the patient as well as establishing an assessment and plan that was discussed personally with the patient.  > 50% of time was spent in direct patient care.  Length of Stay:  LOS: 0 days   Pixie Casino, MD, [facility], [place] Director of the Advanced Lipid Disorders &  Cardiovascular Risk Reduction Clinic Diplomate of the American Board of Clinical Lipidology Attending Cardiologist  Direct Dial: [phone]  Fax: [phone]  Website:  [email or web address] 01/22/2022, 10:16 AM

## 2022-01-22 NOTE — Plan of Care (Signed)
  Problem: Education: Goal: Knowledge of General Education information will improve Description: Including pain rating scale, medication(s)/side effects and non-pharmacologic comfort measures Outcome: Adequate for Discharge   Problem: Health Behavior/Discharge Planning: Goal: Ability to manage health-related needs will improve Outcome: Adequate for Discharge   Problem: Clinical Measurements: Goal: Respiratory complications will improve Outcome: Adequate for Discharge   Problem: Clinical Measurements: Goal: Cardiovascular complication will be avoided Outcome: Adequate for Discharge   

## 2022-01-22 NOTE — Progress Notes (Signed)
ANTICOAGULATION CONSULT NOTE - Follow Up Consult  Pharmacy Consult for heparin Indication:  NSTEMI  Labs: Recent Labs    01/21/22 1857 01/21/22 2129 01/22/22 0045 01/22/22 0142  HGB 12.7  --  12.4  --   HCT 36.5  --  35.3*  --   PLT 186  --  173  --   HEPARINUNFRC  --  0.15*  --  0.15*  CREATININE 1.30*  --  1.26*  --   TROPONINIHS 21* 19*  --   --     Assessment: 81yo female subtherapeutic on heparin with initial dosing for NSTEMI; no infusion issues or signs of bleeding per RN.  Goal of Therapy:  Heparin level 0.3-0.7 units/ml   Plan:  Will increase heparin infusion by 3 units/kg/hr to 950 units/hr and check level in 8 hours.    Wynona Neat, PharmD, BCPS  01/22/2022,3:44 AM

## 2022-01-22 NOTE — Care Management Obs Status (Signed)
[facility] NOTIFICATION   Patient Details  Name: RIVER MCKERCHER MRN: [id number] Date of Birth: April 10, 1940   Medicare Observation Status Notification Given:  Yes    Zenon Mayo, RN 01/22/2022, 4:39 PM

## 2022-01-22 NOTE — Consult Note (Signed)
   [facility] CM Inpatient Consult   01/22/2022  RONNAE KASER November 25, 1940 [id number]  [place] [facility] [ACO] Patient: Medicare ACO REACH  Primary Care Provider:  Manon Hilding, MD, [facility]  Patient screened for hospitalization with noted to assess for potential [place] Management service needs for post hospital transition.  Review of patient's electronic medical record reveals patient is currently in observation status at the time of this review. Also, patient was discussed in morning unit progression meeting.  Plan:  Continue to follow progress and disposition to assess for post hospital care management needs.    For questions contact:   Natividad Brood, RN BSN [place]  [phone] business mobile phone Toll free office [phone]  *[place]  [phone] Fax number: [phone] [email or web address]

## 2022-01-22 NOTE — Progress Notes (Signed)
ANTICOAGULATION CONSULT NOTE  Pharmacy Consult for Heparin Indication: chest pain/ACS  Allergies  Allergen Reactions   Ace Inhibitors Cough   Gemfibrozil Other (See Comments)    Muscle Aches   Moxifloxacin Rash   Ranexa [Ranolazine] Other (See Comments)    "NIGHTMARES"    Patient Measurements: Height: '5\' 2"'$  (157.5 cm) Weight: 66 kg (145 lb 8 oz) IBW/kg (Calculated) : 50.1 Heparin Dosing Weight: 64kg  Vital Signs: Temp: 97.2 F (36.2 C) (10/26 0743) Temp Source: Oral (10/26 0743) BP: 132/51 (10/26 0900) Pulse Rate: 51 (10/26 0400)  Labs: Recent Labs    01/21/22 1857 01/21/22 2129 01/22/22 0045 01/22/22 0142 01/22/22 1049  HGB 12.7  --  12.4  --   --   HCT 36.5  --  35.3*  --   --   PLT 186  --  173  --   --   HEPARINUNFRC  --  0.15*  --  0.15* 0.52  CREATININE 1.30*  --  1.26*  --   --   TROPONINIHS 21* 19*  --   --   --     Estimated Creatinine Clearance: 32.3 mL/min (A) (by C-G formula based on SCr of 1.26 mg/dL (H)).   Assessment: 41 YOF presenting with chest pain transferred from OSH for cardiology evaluation.  Pharmacy consulted to continue on IV heparin for ACS.  Heparin level therapeutic at 0.52 units/mL on 950 units/hr.  No bleeding reported.  Goal of Therapy:  Heparin level 0.3-0.7 units/ml Monitor platelets by anticoagulation protocol: Yes   Plan:  Continue heparin infusion at 950 units/hr Daily heparin level and CBC Monitor for signs/symptoms of bleeding  Pernella Ackerley D. Mina Marble, PharmD, BCPS, [place] 01/22/2022, 1:42 PM

## 2022-01-22 NOTE — Progress Notes (Signed)
Mobility Specialist Progress Note:   01/22/22 1532  Mobility  Activity Ambulated with assistance in hallway  Level of Assistance Standby assist, set-up cues, supervision of patient - no hands on  Assistive Device Front wheel walker  Distance Ambulated (ft) 450 ft  Activity Response Tolerated well  Mobility Referral Yes  $Mobility charge 1 Mobility   Pt received in bed willing to participate in mobility. No complaints of pain. Left in bed with call bell in reach and all needs met.   [facility] Surveyor, mining Chat only

## 2022-01-23 DIAGNOSIS — I25110 Atherosclerotic heart disease of native coronary artery with unstable angina pectoris: Secondary | ICD-10-CM

## 2022-01-23 DIAGNOSIS — I10 Essential (primary) hypertension: Secondary | ICD-10-CM | POA: Diagnosis not present

## 2022-01-23 DIAGNOSIS — I249 Acute ischemic heart disease, unspecified: Secondary | ICD-10-CM | POA: Diagnosis not present

## 2022-01-23 LAB — BASIC METABOLIC PANEL
Anion gap: 6 (ref 5–15)
BUN: 18 mg/dL (ref 8–23)
CO2: 26 mmol/L (ref 22–32)
Calcium: 9.4 mg/dL (ref 8.9–10.3)
Chloride: 105 mmol/L (ref 98–111)
Creatinine, Ser: 1.36 mg/dL — ABNORMAL HIGH (ref 0.44–1.00)
GFR, Estimated: 40 mL/min — ABNORMAL LOW (ref >60)
Glucose, Bld: 95 mg/dL (ref 70–99)
Potassium: 3.8 mmol/L (ref 3.5–5.1)
Sodium: 137 mmol/L (ref 135–145)

## 2022-01-23 LAB — MAGNESIUM: Magnesium: 2 mg/dL (ref 1.7–2.4)

## 2022-01-23 LAB — CBC
HCT: 33.9 % — ABNORMAL LOW (ref 36.0–46.0)
Hemoglobin: 11.5 g/dL — ABNORMAL LOW (ref 12.0–15.0)
MCH: 32.5 pg (ref 26.0–34.0)
MCHC: 33.9 g/dL (ref 30.0–36.0)
MCV: 95.8 fL (ref 80.0–100.0)
Platelets: 168 10*3/uL (ref 150–400)
RBC: 3.54 MIL/uL — ABNORMAL LOW (ref 3.87–5.11)
RDW: 12.6 % (ref 11.5–15.5)
WBC: 5.6 10*3/uL (ref 4.0–10.5)
nRBC: 0 % (ref 0.0–0.2)

## 2022-01-23 LAB — HEPARIN LEVEL (UNFRACTIONATED): Heparin Unfractionated: 0.51 IU/mL (ref 0.30–0.70)

## 2022-01-23 LAB — GLUCOSE, CAPILLARY: Glucose-Capillary: 96 mg/dL (ref 70–99)

## 2022-01-23 NOTE — Progress Notes (Signed)
Order to discharge pt home.  Discharge instructions/AVS given to patient and reviewed - education provided as needed.  Pt advised to call PCP and/or come back to the hospital if there are any problems. Pt verbalized understanding.    

## 2022-01-23 NOTE — Discharge Summary (Signed)
Physician Discharge Summary  Annette Sharp VVO:[id number] DOB: 1940/12/02 DOA: 01/21/2022  PCP: Manon Hilding, MD  Admit date: 01/21/2022 Discharge date: 01/23/2022  Admitted From: Home Disposition:  Home  Recommendations for Outpatient Follow-up:  Follow up with PCP in 1-2 weeks Please obtain BMP/CBC in one week your next doctors visit.  Resume home meds.  Follow-up outpatient cardiology, to be arranged by their service  Discharge Condition: Stable CODE STATUS: Full code Diet recommendation: Heart healthy  Brief/Interim Summary: 81 y.o. female with medical history significant of COPD, CAD status post PCI, GERD, hypothyroidism, HTN, HLD comes to the hospital initially at [facility] with complaints of burning feeling and chest discomfort.  He is a poor historian regarding the events that happened therefore most of the history per husband at bedside.  Husband tells me that patient started having some burning type sensation going from her head to her abdomen area with some chest discomfort on Monday therefore EMS was called and patient was brought to the hospital.  While being admitted patient had elevated troponin trending as high as 234 and [facility] cardiology team was called and patient was transferred here for further work-up.  Patient was seen by cardiology team here who recommended 48 hours of IV heparin.  Completed 48 hours of IV heparin in the hospital, medically stable for discharge from cardiology standpoint.     Assessment & Plan:  Principal Problem:   ACS (acute coronary syndrome) (HCC) Active Problems:   Hyperlipidemia   Essential hypertension   Coronary artery disease involving native coronary artery   COPD (chronic obstructive pulmonary disease) (HCC)   NSTEMI (non-ST elevated myocardial infarction) ([place])      ACS/concerns of NSTEMI History of CAD status post PCI - Transferred here from [facility] for further work-up.  Troponins peaked at 234.  Completed 48 hours of  IV heparin in the hospital.  Cardiology recommending to discharge patient on current home medications Hemoglobin A1c 5.2, LDL 66 nitroglycerin as needed.  Recent echocardiogram in October 2023 showed preserved EF with no obvious wall motion abnormalities.  We will continue home Plavix TSH-normal   Essential hypertension - Norvasc, Imdur.   Hyperlipidemia - Crestor   Hypothyroidism - Synthroid   COPD - As needed bronchodilators     Discharge Diagnoses:  Principal Problem:   ACS (acute coronary syndrome) (HCC) Active Problems:   Hyperlipidemia   Essential hypertension   Coronary artery disease involving native coronary artery   COPD (chronic obstructive pulmonary disease) (HCC)   NSTEMI (non-ST elevated myocardial infarction) ([place])      Consultations: Cardiology-[facility]  Subjective: Feels great no complaints.  Husband at bedside  Discharge Exam: Vitals:   01/23/22 0520 01/23/22 0731  BP: (!) 144/55 (!) 154/57  Pulse:    Resp: 20 19  Temp: 97.8 F (36.6 C) 97.9 F (36.6 C)  SpO2: 97% 99%   Vitals:   01/22/22 0900 01/22/22 2015 01/23/22 0520 01/23/22 0731  BP: (!) 132/51 (!) 149/57 (!) 144/55 (!) 154/57  Pulse:      Resp:  '19 20 19  '$ Temp:  98.1 F (36.7 C) 97.8 F (36.6 C) 97.9 F (36.6 C)  TempSrc:  Oral Oral Oral  SpO2:  94% 97% 99%  Weight:   66.8 kg   Height:        General: Pt is alert, awake, not in acute distress Cardiovascular: RRR, S1/S2 +, no rubs, no gallops Respiratory: CTA bilaterally, no wheezing, no rhonchi Abdominal: Soft, NT, ND, bowel sounds +  Extremities: no edema, no cyanosis  Discharge Instructions   Allergies as of 01/23/2022       Reactions   Ace Inhibitors Cough   Gemfibrozil Other (See Comments)   Muscle Aches   Moxifloxacin Rash   Ranexa [ranolazine] Other (See Comments)   "NIGHTMARES"        Medication List     TAKE these medications    acyclovir 400 MG tablet Commonly known as: ZOVIRAX Take 400 mg by  mouth in the morning.   albuterol 108 (90 Base) MCG/ACT inhaler Commonly known as: VENTOLIN HFA Inhale 2 puffs into the lungs every 6 (six) hours as needed for wheezing or shortness of breath.   amLODipine 10 MG tablet Commonly known as: NORVASC Take 10 mg by mouth every morning.   aspirin EC 81 MG tablet Take 81 mg by mouth in the morning.   buPROPion 150 MG 12 hr tablet Commonly known as: WELLBUTRIN SR Take 150 mg by mouth 2 (two) times daily.   citalopram 20 MG tablet Commonly known as: CELEXA Take 20 mg by mouth every morning.   clopidogrel 75 MG tablet Commonly known as: PLAVIX Take 1 tablet (75 mg total) by mouth every morning.   donepezil 5 MG tablet Commonly known as: ARICEPT Take 5 mg by mouth every morning.   hydrALAZINE 25 MG tablet Commonly known as: APRESOLINE Take 1 tablet (25 mg total) by mouth 2 (two) times daily.   hyoscyamine 0.375 MG 12 hr tablet Commonly known as: LEVBID TAKE 1 TABLET (0.375 MG TOTAL) BY MOUTH DAILY.   isosorbide mononitrate 60 MG 24 hr tablet Commonly known as: IMDUR Take 1.5 tablets (90 mg total) by mouth 2 (two) times daily.   levothyroxine 75 MCG tablet Commonly known as: SYNTHROID Take 75 mcg by mouth daily.   nitroGLYCERIN 0.4 MG SL tablet Commonly known as: NITROSTAT Place 1 tablet (0.4 mg total) under the tongue every 5 (five) minutes x 3 doses as needed for chest pain (if no relief after 3rd dose, proceed to the ED for an evaluation).   nystatin 100000 UNIT/ML suspension Commonly known as: MYCOSTATIN Take 5 mLs by mouth 4 (four) times daily.   rosuvastatin 20 MG tablet Commonly known as: CRESTOR TAKE 1 TABLET BY MOUTH EVERY DAY What changed: when to take this   VITAMIN B-1 PO Take 1 tablet by mouth daily.        Follow-up Information     Sasser, Silvestre Moment, MD Follow up in 1 week(s).   Specialty: Family Medicine Contact information: [address] [place] [address] [phone]                 Allergies  Allergen Reactions   Ace Inhibitors Cough   Gemfibrozil Other (See Comments)    Muscle Aches   Moxifloxacin Rash   Ranexa [Ranolazine] Other (See Comments)    "NIGHTMARES"    You were cared for by a hospitalist during your hospital stay. If you have any questions about your discharge medications or the care you received while you were in the hospital after you are discharged, you can call the unit and asked to speak with the hospitalist on call if the hospitalist that took care of you is not available. Once you are discharged, your primary care physician will handle any further medical issues. Please note that no refills for any discharge medications will be authorized once you are discharged, as it is imperative that you return to your primary care physician (  or establish a relationship with a primary care physician if you do not have one) for your aftercare needs so that they can reassess your need for medications and monitor your lab values.   Procedures/Studies: ECHOCARDIOGRAM LIMITED BUBBLE STUDY  Result Date: 01/05/2022    ECHOCARDIOGRAM LIMITED REPORT   Patient Name:   Annette Sharp Date of Exam: 01/05/2022 Medical Rec #:  [id number]     Height:       62.0 in Accession #:    [id number]    Weight:       160.1 lb Date of Birth:  1940/05/12      BSA:          1.739 m Patient Age:    48 years      BP:           172/61 mmHg Patient Gender: F             HR:           58 bpm. Exam Location:  [facility] Procedure: Limited Echo and Saline Contrast Bubble Study Indications:    Stroke  History:        Patient has prior history of Echocardiogram examinations, most                 recent 12/25/2020. CAD, COPD and Stroke, Signs/Symptoms:Chest                 Pain and Dyspnea; Risk Factors:Dyslipidemia and Hypertension.  Sonographer:    Wenda Low Referring Phys: [phone] [facility] IMPRESSIONS  1. Cannot rule out very focal wall motion abnormality in the mid inferoseptum and anteroseptum.  Left ventricular ejection fraction, by estimation, is 60 to 65%. The left ventricle has normal function. The left ventricle has no regional wall motion abnormalities.  2. Right ventricular systolic function is normal. The right ventricular size is normal.  3. The mitral valve is normal in structure.  4. The aortic valve is tricuspid. Aortic valve sclerosis/calcification is present, without any evidence of aortic stenosis.  5. The inferior vena cava is normal in size with greater than 50% respiratory variability, suggesting right atrial pressure of 3 mmHg.  6. Agitated saline contrast bubble study was negative, with no evidence of any interatrial shunt. FINDINGS  Left Ventricle: Cannot rule out very focal wall motion abnormality in the mid inferoseptum and anteroseptum. Left ventricular ejection fraction, by estimation, is 60 to 65%. The left ventricle has normal function. The left ventricle has no regional wall  motion abnormalities. The left ventricular internal cavity size was normal in size. There is no left ventricular hypertrophy. Right Ventricle: The right ventricular size is normal. No increase in right ventricular wall thickness. Right ventricular systolic function is normal. Left Atrium: Left atrial size was normal in size. Right Atrium: Right atrial size was normal in size. Pericardium: There is no evidence of pericardial effusion. Mitral Valve: The mitral valve is normal in structure. Mild mitral annular calcification. Tricuspid Valve: The tricuspid valve is normal in structure. Aortic Valve: The aortic valve is tricuspid. Aortic valve sclerosis/calcification is present, without any evidence of aortic stenosis. Pulmonic Valve: The pulmonic valve was not assessed. Aorta: The aortic root is normal in size and structure. Venous: The inferior vena cava is normal in size with greater than 50% respiratory variability, suggesting right atrial pressure of 3 mmHg. IAS/Shunts: No atrial level shunt detected by  color flow Doppler. Agitated saline contrast was given intravenously to evaluate for intracardiac shunting. Agitated  saline contrast bubble study was negative, with no evidence of any interatrial shunt. There  is no evidence of a patent foramen ovale. There is no evidence of an atrial septal defect. LEFT VENTRICLE PLAX 2D LVIDd:         4.00 cm LVIDs:         2.30 cm LV PW:         1.10 cm LV IVS:        0.90 cm LVOT diam:     1.80 cm LVOT Area:     2.54 cm  LEFT ATRIUM         Index LA diam:    3.80 cm 2.19 cm/m   AORTA Ao Root diam: 2.60 cm  SHUNTS Systemic Diam: 1.80 cm Fransico Him MD Electronically signed by Fransico Him MD Signature Date/Time: 01/05/2022/4:31:19 PM    Final      The results of significant diagnostics from this hospitalization (including imaging, microbiology, ancillary and laboratory) are listed below for reference.     Microbiology: No results found for this or any previous visit (from the past 240 hour(s)).   Labs: BNP (last 3 results) No results for input(s): "BNP" in the last 8760 hours. Basic Metabolic Panel: Recent Labs  Lab 01/21/22 1857 01/22/22 0045 01/23/22 0125  NA 137 136 137  K 3.8 3.9 3.8  CL 103 103 105  CO2 '25 26 26  '$ GLUCOSE 107* 108* 95  BUN '12 13 18  '$ CREATININE 1.30* 1.26* 1.36*  CALCIUM 9.3 9.3 9.4  MG 1.9 2.1 2.0   Liver Function Tests: No results for input(s): "AST", "ALT", "ALKPHOS", "BILITOT", "PROT", "ALBUMIN" in the last 168 hours. No results for input(s): "LIPASE", "AMYLASE" in the last 168 hours. No results for input(s): "AMMONIA" in the last 168 hours. CBC: Recent Labs  Lab 01/21/22 1857 01/22/22 0045 01/23/22 0125  WBC 6.1 7.1 5.6  HGB 12.7 12.4 11.5*  HCT 36.5 35.3* 33.9*  MCV 92.9 94.6 95.8  PLT 186 173 168   Cardiac Enzymes: No results for input(s): "CKTOTAL", "CKMB", "CKMBINDEX", "TROPONINI" in the last 168 hours. BNP: Invalid input(s): "POCBNP" CBG: Recent Labs  Lab 01/22/22 0547 01/23/22 0527  GLUCAP 101*  96   D-Dimer No results for input(s): "DDIMER" in the last 72 hours. Hgb A1c Recent Labs    01/21/22 1901  HGBA1C 5.2   Lipid Profile Recent Labs    01/22/22 0045  CHOL 141  HDL 67  LDLCALC 66  TRIG 38  CHOLHDL 2.1   Thyroid function studies Recent Labs    01/22/22 1039  TSH 1.823   Anemia work up No results for input(s): "VITAMINB12", "FOLATE", "FERRITIN", "TIBC", "IRON", "RETICCTPCT" in the last 72 hours. Urinalysis    Component Value Date/Time   COLORURINE YELLOW 05/25/2015 2044   APPEARANCEUR CLEAR 05/25/2015 2044   LABSPEC <1.005 (L) 05/25/2015 2044   PHURINE 6.0 05/25/2015 2044   GLUCOSEU NEGATIVE 05/25/2015 2044   HGBUR NEGATIVE 05/25/2015 2044   BILIRUBINUR NEGATIVE 05/25/2015 2044   KETONESUR NEGATIVE 05/25/2015 2044   PROTEINUR NEGATIVE 05/25/2015 2044   UROBILINOGEN 1.0 08/03/2014 0745   NITRITE NEGATIVE 05/25/2015 2044   LEUKOCYTESUR NEGATIVE 05/25/2015 2044   Sepsis Labs Recent Labs  Lab 01/21/22 1857 01/22/22 0045 01/23/22 0125  WBC 6.1 7.1 5.6   Microbiology No results found for this or any previous visit (from the past 240 hour(s)).   Time coordinating discharge:  I have spent 35 minutes face to face with the patient and on  the ward discussing the patients care, assessment, plan and disposition with other care givers. >50% of the time was devoted counseling the patient about the risks and benefits of treatment/Discharge disposition and coordinating care.   SIGNED:   Damita Lack, MD  [facility] 01/23/2022, 11:19 AM   If 7PM-7AM, please contact night-coverage

## 2022-01-23 NOTE — Progress Notes (Signed)
DAILY PROGRESS NOTE   Patient Name: Annette Sharp Date of Encounter: 01/23/2022 Cardiologist: Carlyle Dolly, MD  Chief Complaint   No chest pain  Patient Profile   Annette Sharp is a 81 y.o. female with a hx of CAD (prior DES to RCA 2007), paroxysmal atrial tachycardia, HTN, OSA (not able to tolerate CPAP), HLD, prior CVA, GERD, anxiety, carotid artery disease, hypothyroidism, peptic ulcer disease, CKD stage 3 by labs (borderline a-b) who is being seen 01/21/2022 for the evaluation of elevated troponin at the request of Dr. Reesa Chew.  Subjective   No further chest pain. Mild decrease in hemoglobin.   Objective   Vitals:   01/22/22 0900 01/22/22 2015 01/23/22 0520 01/23/22 0731  BP: (!) 132/51 (!) 149/57 (!) 144/55 (!) 154/57  Pulse:      Resp:  '19 20 19  '$ Temp:  98.1 F (36.7 C) 97.8 F (36.6 C) 97.9 F (36.6 C)  TempSrc:  Oral Oral Oral  SpO2:  94% 97% 99%  Weight:   66.8 kg   Height:        Intake/Output Summary (Last 24 hours) at 01/23/2022 0844 Last data filed at 01/23/2022 1950 Gross per 24 hour  Intake 1345.51 ml  Output 1300 ml  Net 45.51 ml   Filed Weights   01/21/22 1617 01/22/22 0400 01/23/22 0520  Weight: 66.2 kg 66 kg 66.8 kg    Physical Exam   General appearance: alert and no distress Neck: no carotid bruit, no JVD, and thyroid not enlarged, symmetric, no tenderness/mass/nodules Lungs: clear to auscultation bilaterally Heart: regular rate and rhythm, S1, S2 normal, no murmur, click, rub or gallop Abdomen: soft, non-tender; bowel sounds normal; no masses,  no organomegaly Extremities: extremities normal, atraumatic, no cyanosis or edema Pulses: 2+ and symmetric Skin: Skin color, texture, turgor normal. No rashes or lesions Neurologic: Grossly normal Psych: Pleasant  Inpatient Medications    Scheduled Meds:  amLODipine  10 mg Oral q morning   aspirin EC  81 mg Oral q AM   buPROPion  150 mg Oral BID   clopidogrel  75 mg Oral q morning    donepezil  5 mg Oral q morning   influenza vaccine adjuvanted  0.5 mL Intramuscular Tomorrow-1000   isosorbide mononitrate  90 mg Oral BID   levothyroxine  75 mcg Oral Daily   pantoprazole  40 mg Oral Daily   rosuvastatin  20 mg Oral QPM   thiamine  100 mg Oral Daily    Continuous Infusions:  heparin 950 Units/hr (01/23/22 0440)    PRN Meds: acetaminophen, guaiFENesin, hydrALAZINE, ipratropium-albuterol, metoprolol tartrate, nitroGLYCERIN, ondansetron (ZOFRAN) IV, senna-docusate, traZODone   Labs   Results for orders placed or performed during the hospital encounter of 01/21/22 (from the past 48 hour(s))  Troponin I (High Sensitivity)     Status: Abnormal   Collection Time: 01/21/22  6:57 PM  Result Value Ref Range   Troponin I (High Sensitivity) 21 (H) <18 ng/L    Comment: (NOTE) Elevated high sensitivity troponin I (hsTnI) values and significant  changes across serial measurements may suggest ACS but many other  chronic and acute conditions are known to elevate hsTnI results.  Refer to the "Links" section for chest pain algorithms and additional  guidance. Performed at [facility], [place] [address], [place], [place] [address]   Basic metabolic panel     Status: Abnormal   Collection Time: 01/21/22  6:57 PM  Result Value Ref Range  Sodium 137 135 - 145 mmol/L   Potassium 3.8 3.5 - 5.1 mmol/L   Chloride 103 98 - 111 mmol/L   CO2 25 22 - 32 mmol/L   Glucose, Bld 107 (H) 70 - 99 mg/dL    Comment: Glucose reference range applies only to samples taken after fasting for at least 8 hours.   BUN 12 8 - 23 mg/dL   Creatinine, Ser 1.30 (H) 0.44 - 1.00 mg/dL   Calcium 9.3 8.9 - 10.3 mg/dL   GFR, Estimated 42 (L) >60 mL/min    Comment: (NOTE) Calculated using the CKD-EPI Creatinine Equation (2021)    Anion gap 9 5 - 15    Comment: Performed at [facility] [address] [place], [place] [address]  Magnesium     Status: None   Collection Time: 01/21/22  6:57 PM   Result Value Ref Range   Magnesium 1.9 1.7 - 2.4 mg/dL    Comment: Performed at [facility], [place] [address], [place] [address]  CBC     Status: None   Collection Time: 01/21/22  6:57 PM  Result Value Ref Range   WBC 6.1 4.0 - 10.5 K/uL   RBC 3.93 3.87 - 5.11 MIL/uL   Hemoglobin 12.7 12.0 - 15.0 g/dL   HCT 36.5 36.0 - 46.0 %   MCV 92.9 80.0 - 100.0 fL   MCH 32.3 26.0 - 34.0 pg   MCHC 34.8 30.0 - 36.0 g/dL   RDW 12.5 11.5 - 15.5 %   Platelets 186 150 - 400 K/uL   nRBC 0.0 0.0 - 0.2 %    Comment: Performed at [facility], [place] [address], [place], [place] [address]  Hemoglobin A1c     Status: None   Collection Time: 01/21/22  7:01 PM  Result Value Ref Range   Hgb A1c MFr Bld 5.2 4.8 - 5.6 %    Comment: (NOTE) Pre diabetes:          5.7%-6.4%  Diabetes:              >6.4%  Glycemic control for   <7.0% adults with diabetes    Mean Plasma Glucose 102.54 mg/dL    Comment: Performed at [facility] [address] [place], [place] [address]  Heparin level (unfractionated)     Status: Abnormal   Collection Time: 01/21/22  9:29 PM  Result Value Ref Range   Heparin Unfractionated 0.15 (L) 0.30 - 0.70 IU/mL    Comment: (NOTE) The clinical reportable range upper limit is being lowered to >1.10 to align with the FDA approved guidance for the current laboratory assay.  If heparin results are below expected values, and patient dosage has  been confirmed, suggest follow up testing of antithrombin III levels. Performed at [facility], [place] [address], [place], [place] [address]   Troponin I (High Sensitivity)     Status: Abnormal   Collection Time: 01/21/22  9:29 PM  Result Value Ref Range   Troponin I (High Sensitivity) 19 (H) <18 ng/L    Comment: (NOTE) Elevated high sensitivity troponin I (hsTnI) values and significant  changes across serial measurements may suggest ACS but many other  chronic and acute conditions are known to elevate hsTnI  results.  Refer to the "Links" section for chest pain algorithms and additional  guidance. Performed at [facility], [place] [address], [place], [place] [address]   Basic metabolic panel  Status: Abnormal   Collection Time: 01/22/22 12:45 AM  Result Value Ref Range   Sodium 136 135 - 145 mmol/L   Potassium 3.9 3.5 - 5.1 mmol/L   Chloride 103 98 - 111 mmol/L   CO2 26 22 - 32 mmol/L   Glucose, Bld 108 (H) 70 - 99 mg/dL    Comment: Glucose reference range applies only to samples taken after fasting for at least 8 hours.   BUN 13 8 - 23 mg/dL   Creatinine, Ser 1.26 (H) 0.44 - 1.00 mg/dL   Calcium 9.3 8.9 - 10.3 mg/dL   GFR, Estimated 43 (L) >60 mL/min    Comment: (NOTE) Calculated using the CKD-EPI Creatinine Equation (2021)    Anion gap 7 5 - 15    Comment: Performed at [facility] [address], [place], [place] [address]  CBC     Status: Abnormal   Collection Time: 01/22/22 12:45 AM  Result Value Ref Range   WBC 7.1 4.0 - 10.5 K/uL   RBC 3.73 (L) 3.87 - 5.11 MIL/uL   Hemoglobin 12.4 12.0 - 15.0 g/dL   HCT 35.3 (L) 36.0 - 46.0 %   MCV 94.6 80.0 - 100.0 fL   MCH 33.2 26.0 - 34.0 pg   MCHC 35.1 30.0 - 36.0 g/dL   RDW 12.7 11.5 - 15.5 %   Platelets 173 150 - 400 K/uL   nRBC 0.0 0.0 - 0.2 %    Comment: Performed at [facility], [place] [address], [place], [place] [address]  Magnesium     Status: None   Collection Time: 01/22/22 12:45 AM  Result Value Ref Range   Magnesium 2.1 1.7 - 2.4 mg/dL    Comment: Performed at [facility] [address] [place], [place] [address]  Lipid panel     Status: None   Collection Time: 01/22/22 12:45 AM  Result Value Ref Range   Cholesterol 141 0 - 200 mg/dL   Triglycerides 38 <150 mg/dL   HDL 67 >40 mg/dL   Total CHOL/HDL Ratio 2.1 RATIO   VLDL 8 0 - 40 mg/dL   LDL Cholesterol 66 0 - 99 mg/dL    Comment:        Total Cholesterol/HDL:CHD Risk Coronary Heart Disease Risk Table                     Men    Women  1/2 Average Risk   3.4   3.3  Average Risk       5.0   4.4  2 X Average Risk   9.6   7.1  3 X Average Risk  23.4   11.0        Use the calculated Patient Ratio above and the CHD Risk Table to determine the patient's CHD Risk.        ATP III CLASSIFICATION (LDL):  <100     mg/dL   Optimal  100-129  mg/dL   Near or Above                    Optimal  130-159  mg/dL   Borderline  160-189  mg/dL   High  >190     mg/dL   Very High Performed at [facility] [address], [place], [place] [address]   Heparin level (unfractionated)     Status: Abnormal   Collection Time: 01/22/22  1:42 AM  Result Value Ref Range  Heparin Unfractionated 0.15 (L) 0.30 - 0.70 IU/mL    Comment: (NOTE) The clinical reportable range upper limit is being lowered to >1.10 to align with the FDA approved guidance for the current laboratory assay.  If heparin results are below expected values, and patient dosage has  been confirmed, suggest follow up testing of antithrombin III levels. Performed at [facility], [place] [address], [place], [place] [address]   Glucose, capillary     Status: Abnormal   Collection Time: 01/22/22  5:47 AM  Result Value Ref Range   Glucose-Capillary 101 (H) 70 - 99 mg/dL    Comment: Glucose reference range applies only to samples taken after fasting for at least 8 hours.  TSH     Status: None   Collection Time: 01/22/22 10:39 AM  Result Value Ref Range   TSH 1.823 0.350 - 4.500 uIU/mL    Comment: Performed by a 3rd Generation assay with a functional sensitivity of <=0.01 uIU/mL. Performed at [facility], [place] [address] [place], [place] [address]   Heparin level (unfractionated)     Status: None   Collection Time: 01/22/22 10:49 AM  Result Value Ref Range   Heparin Unfractionated 0.52 0.30 - 0.70 IU/mL    Comment: (NOTE) The clinical reportable range upper limit is being lowered to >1.10 to align with the FDA approved guidance for the current  laboratory assay.  If heparin results are below expected values, and patient dosage has  been confirmed, suggest follow up testing of antithrombin III levels. Performed at [facility], [place] [address], [place], [place] [address]   Basic metabolic panel     Status: Abnormal   Collection Time: 01/23/22  1:25 AM  Result Value Ref Range   Sodium 137 135 - 145 mmol/L   Potassium 3.8 3.5 - 5.1 mmol/L   Chloride 105 98 - 111 mmol/L   CO2 26 22 - 32 mmol/L   Glucose, Bld 95 70 - 99 mg/dL    Comment: Glucose reference range applies only to samples taken after fasting for at least 8 hours.   BUN 18 8 - 23 mg/dL   Creatinine, Ser 1.36 (H) 0.44 - 1.00 mg/dL   Calcium 9.4 8.9 - 10.3 mg/dL   GFR, Estimated 40 (L) >60 mL/min    Comment: (NOTE) Calculated using the CKD-EPI Creatinine Equation (2021)    Anion gap 6 5 - 15    Comment: Performed at [facility] [address] [place], [place] [address]  CBC     Status: Abnormal   Collection Time: 01/23/22  1:25 AM  Result Value Ref Range   WBC 5.6 4.0 - 10.5 K/uL   RBC 3.54 (L) 3.87 - 5.11 MIL/uL   Hemoglobin 11.5 (L) 12.0 - 15.0 g/dL   HCT 33.9 (L) 36.0 - 46.0 %   MCV 95.8 80.0 - 100.0 fL   MCH 32.5 26.0 - 34.0 pg   MCHC 33.9 30.0 - 36.0 g/dL   RDW 12.6 11.5 - 15.5 %   Platelets 168 150 - 400 K/uL   nRBC 0.0 0.0 - 0.2 %    Comment: Performed at [facility], [place] [address] [place], [place] [address]  Magnesium     Status: None   Collection Time: 01/23/22  1:25 AM  Result Value Ref Range   Magnesium 2.0 1.7 - 2.4 mg/dL    Comment: Performed at [facility] [address] [place], [place]  [address]  Heparin level (unfractionated)     Status: None   Collection Time: 01/23/22  1:25 AM  Result Value Ref Range   Heparin Unfractionated 0.51 0.30 - 0.70 IU/mL    Comment: (NOTE) The clinical reportable range upper limit is being lowered to >1.10 to align with the FDA approved guidance for the current  laboratory assay.  If heparin results are below expected values, and patient dosage has  been confirmed, suggest follow up testing of antithrombin III levels. Performed at [facility], [place] [address], [place], [place] [address]   Glucose, capillary     Status: None   Collection Time: 01/23/22  5:27 AM  Result Value Ref Range   Glucose-Capillary 96 70 - 99 mg/dL    Comment: Glucose reference range applies only to samples taken after fasting for at least 8 hours.   Comment 1 Notify RN    Comment 2 Document in Chart     ECG   N/A  Telemetry   Sinus rhythm - Personally Reviewed  Radiology    No results found.  Cardiac Studies   N/A  Assessment   Principal Problem:   ACS (acute coronary syndrome) (HCC) Active Problems:   Hyperlipidemia   Essential hypertension   Coronary artery disease involving native coronary artery   COPD (chronic obstructive pulmonary disease) (HCC)   NSTEMI (non-ST elevated myocardial infarction) ([facility])   Plan   No further chest pain. Mild decrease in hemoglobin without s/s of bleeding. Will d/c heparin today. On DAPT, imdur, statin - ok to d/c home today. Follow-up with Dr. Carlyle Dolly.  Time Spent Directly with Patient:  I have spent a total of 25 minutes with the patient reviewing hospital notes, telemetry, EKGs, labs and examining the patient as well as establishing an assessment and plan that was discussed personally with the patient.  > 50% of time was spent in direct patient care.  Length of Stay:  LOS: 1 day   Pixie Casino, MD, [facility], [place] Director of the Advanced Lipid Disorders &  Cardiovascular Risk Reduction Clinic Diplomate of the American Board of Clinical Lipidology Attending Cardiologist  Direct Dial: [phone]  Fax: [phone]  Website:  [email or web address] 01/23/2022, 8:44 AM

## 2022-01-23 NOTE — Plan of Care (Signed)

## 2022-01-23 NOTE — Progress Notes (Signed)
ANTICOAGULATION CONSULT NOTE  Pharmacy Consult for Heparin Indication: chest pain/ACS  Allergies  Allergen Reactions   Ace Inhibitors Cough   Gemfibrozil Other (See Comments)    Muscle Aches   Moxifloxacin Rash   Ranexa [Ranolazine] Other (See Comments)    "NIGHTMARES"    Patient Measurements: Height: '5\' 2"'$  (157.5 cm) Weight: 66.8 kg (147 lb 4.8 oz) IBW/kg (Calculated) : 50.1 Heparin Dosing Weight: 64kg  Vital Signs: Temp: 97.8 F (36.6 C) (10/27 0520) Temp Source: Oral (10/27 0520) BP: 144/55 (10/27 0520)  Labs: Recent Labs    01/21/22 1857 01/21/22 1857 01/21/22 2129 01/22/22 0045 01/22/22 0142 01/22/22 1049 01/23/22 0125  HGB 12.7  --   --  12.4  --   --  11.5*  HCT 36.5  --   --  35.3*  --   --  33.9*  PLT 186  --   --  173  --   --  168  HEPARINUNFRC  --    < > 0.15*  --  0.15* 0.52 0.51  CREATININE 1.30*  --   --  1.26*  --   --  1.36*  TROPONINIHS 21*  --  19*  --   --   --   --    < > = values in this interval not displayed.     Estimated Creatinine Clearance: 30.1 mL/min (A) (by C-G formula based on SCr of 1.36 mg/dL (H)).   Assessment: 75 YOF presenting with chest pain transferred from OSH for cardiology evaluation.  Pharmacy consulted to continue on IV heparin for ACS.  Heparin level therapeutic at 0.51 units/mL on 950 units/hr.  No bleeding reported.  Goal of Therapy:  Heparin level 0.3-0.7 units/ml Monitor platelets by anticoagulation protocol: Yes   Plan:  Continue heparin infusion at 950 units/hr Daily heparin level and CBC Monitor for signs/symptoms of bleeding  Franchot Pollitt D. Mina Marble, PharmD, BCPS, [place] 01/23/2022, 7:08 AM

## 2022-02-03 ENCOUNTER — Ambulatory Visit (INDEPENDENT_AMBULATORY_CARE_PROVIDER_SITE_OTHER): Payer: Medicare Other | Admitting: Gastroenterology

## 2022-02-03 ENCOUNTER — Encounter (INDEPENDENT_AMBULATORY_CARE_PROVIDER_SITE_OTHER): Payer: Self-pay | Admitting: Gastroenterology

## 2022-02-03 ENCOUNTER — Telehealth (INDEPENDENT_AMBULATORY_CARE_PROVIDER_SITE_OTHER): Payer: Self-pay | Admitting: *Deleted

## 2022-02-03 VITALS — BP 111/69 | HR 80 | Temp 97.1°F | Ht 62.0 in | Wt 147.0 lb

## 2022-02-03 DIAGNOSIS — R1310 Dysphagia, unspecified: Secondary | ICD-10-CM | POA: Diagnosis not present

## 2022-02-03 DIAGNOSIS — K581 Irritable bowel syndrome with constipation: Secondary | ICD-10-CM | POA: Insufficient documentation

## 2022-02-03 DIAGNOSIS — K5900 Constipation, unspecified: Secondary | ICD-10-CM | POA: Diagnosis not present

## 2022-02-03 NOTE — Patient Instructions (Signed)
It was nice to meet you! We will talk with Dr. Harl Bowie to make sure it is safe for you to undergo upper endoscopy to evaluate your swallowing For now, please avoid thicker, drier foods such as breads and meats, make sure you are chewing thoroughly, taking small bites, sips of liquids between bites  In regards to constipation, Start taking Miralax 1 capful every day for one week. If bowel movements do not improve, increase to 1 capful every 12 hours. If after two weeks there is no improvement, increase to 1 capful every 8 hours Make sure diet Is high in fruits and veggies and you are drinking plenty of water   Follow up 4 months

## 2022-02-03 NOTE — Telephone Encounter (Signed)
Preoperative team, patient has follow-up appointment with Dr. Harl Bowie in December.  Please add preoperative cardiac evaluation to appointment note.  I will defer preoperative cardiac evaluation to her at that time.  We will remove her from the preoperative pool.  Thank you for your help.  Jossie Ng. Jaretzi Droz NP-C     02/03/2022, 12:46 PM [place] [address] Office [phone] Fax [phone]

## 2022-02-03 NOTE — Telephone Encounter (Signed)
  Request for patient to stop medication prior to procedure or is needing cleareance  02/03/22  Annette Sharp [birth year]  What type of surgery is being performed? Esophagogastroduodenoscopy with possible dilation  When is surgery scheduled? TBD  What type of clearance is required (medical or pharmacy to hold medication or both? CARDIAC CLEARANCE   Name of physician performing surgery?  Dr. Maylon Peppers [facility] Phone: [phone] Fax: [phone]  Anethesia type (none, local, MAC, general)? MAC

## 2022-02-03 NOTE — Progress Notes (Addendum)
Referring Provider: Manon Hilding, MD Primary Care Physician:  Manon Hilding, MD Primary GI Physician: Previously Rehman   Chief Complaint  Patient presents with   Dysphagia    Patient here today due to issues with dysphagia. Patient says food and pills get hung in her throat. Patient also says she has a burning sensation at times that starts in her head and radiates down to her thighs. Has some issues with fecal incontinence.    HPI:   Annette Sharp is a 81 y.o. female with past medical history of anxity, back pain, COPD, CAD, depression, GERD, HLD, HTN, hypothyroidism, PUD, sleep apnea, IBS   Patient presenting today for dysphagia.  Last labs in August with TSH 1.353, LFTs, electrolytes and hgb all WNL  Patient reports that she has had issues with dysphagia, mostly with thicker foods for the past month. She denies issues with liquids, sometimes has issues with pills. She does not feel that she is having heartburn or acid regurgitation. She notes some pressure in her chest at times, she also notes some SOB with this. She reports she had a mild heart attack last week (NSTEMI per chart review at the end of October). She is being followed by Dr. Harl Bowie and is seeing him in December.  Her husband reports that she does not want to eat much, she will eat sweets but is not eating well otherwise. Notably down about 13 pounds since March.  Does note that sometimes nausea and vomiting occur when riding in the car. She has some lower abdominal pain, she has levbid on med list but they are not sure if she is taking this or not. She has constipation, noting that she can go up to a week without a BM. She will sometimes manually disimpact herself. She thinks that abdominal pain improves after a BM. She is not taking anything for constipation. Husband reports she drinks a lot of sodas. She is not taking any NSAIDs. Denies rectal bleeding or melena.    Last Colonoscopy: 06/18/21 - One small polyp at the  hepatic flexure, removed with a cold snare.  - One small polyp at the splenic flexure. Biopsied (both tubular adenomas)  - Diverticulosis in the sigmoid colon. - External hemorrhoids.  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 s/p DES to RCA in 2007 b. patent stent by cath in 12/2014 with nonobstructive disease along LAD and RCA   Coronary atherosclerosis of native coronary artery    NEGATIVE LEXISCAN FOR ISCHEMIA MARCH 23,2010 NORMAL LV FUNCTTION   Depression    Dyspnea    with exertion   GERD (gastroesophageal reflux disease)    Headache    chronic headache from fall in December   Hyperlipidemia    Hypertension    Hypothyroidism    Other and unspecified angina pectoris    ANGINA   Overweight(278.02)    OBESITY   Peptic ulcer disease    Primary localized osteoarthritis of left knee    Sleep apnea    SEVERE OBSTRUCTIVE    cpap   Stroke [facility])    affected memory   Thyroid disease  TREATED HYPOTHYROIDISM    Past Surgical History:  Procedure Laterality Date   ABDOMINAL HYSTERECTOMY     BIOPSY  06/18/2021   Procedure: BIOPSY;  Surgeon: Rogene Houston, MD;  [facility];  Service: Endoscopy;;   CARDIAC CATHETERIZATION N/A 01/22/2015   Procedure: Right/Left Heart Cath and Coronary Angiography;  Surgeon: Wellington Hampshire, MD;  Location: [place] [facility];  Service: Cardiovascular;  Laterality: N/A;   CARPAL TUNNEL RELEASE     CHOLECYSTECTOMY     COLONOSCOPY N/A 03/31/2018   Procedure: COLONOSCOPY;  Surgeon: Rogene Houston, MD;  [facility];  Service: Endoscopy;  Laterality: N/A;  2:45   COLONOSCOPY WITH PROPOFOL N/A 06/18/2021   Procedure:  COLONOSCOPY WITH PROPOFOL;  Surgeon: Rogene Houston, MD;  [facility];  Service: Endoscopy;  Laterality: N/A;  11:00   JOINT REPLACEMENT     LEFT HEART CATH AND CORONARY ANGIOGRAPHY N/A 04/28/2018   Procedure: LEFT HEART CATH AND CORONARY ANGIOGRAPHY;  Surgeon: Belva Crome, MD;  Location: [place] [facility];  Service: Cardiovascular;  Laterality: N/A;   POLYPECTOMY  03/31/2018   Procedure: POLYPECTOMY;  Surgeon: Rogene Houston, MD;  [facility];  Service: Endoscopy;;  splen. flex, cecum   POLYPECTOMY  06/18/2021   Procedure: POLYPECTOMY;  Surgeon: Rogene Houston, MD;  [facility];  Service: Endoscopy;;   REPLACEMENT TOTAL KNEE Right 07/28/2010   ROTATOR CUFF REPAIR Right 07/08/2009   TOTAL ABDOMINAL HYSTERECTOMY W/ BILATERAL SALPINGOOPHORECTOMY     TOTAL KNEE ARTHROPLASTY Left 04/08/2015   Procedure: LEFT TOTAL KNEE ARTHROPLASTY;  Surgeon: Elsie Saas, MD;  Location: [place];  Service: Orthopedics;  Laterality: Left;   TOTAL SHOULDER ARTHROPLASTY Left 06/02/2017   Procedure: TOTAL SHOULDER ARTHROPLASTY;  Surgeon: Hiram Gash, MD;  Location: [place];  Service: Orthopedics;  Laterality: Left;    Current Outpatient Medications  Medication Sig Dispense Refill   acyclovir (ZOVIRAX) 400 MG tablet Take 400 mg by mouth in the morning.     albuterol (PROVENTIL HFA;VENTOLIN HFA) 108 (90 Base) MCG/ACT inhaler Inhale 2 puffs into the lungs every 6 (six) hours as needed for wheezing or shortness of breath. 1 Inhaler 2   amLODipine (NORVASC) 10 MG tablet Take 10 mg by mouth every morning.  3   aspirin EC 81 MG tablet Take 81 mg by mouth in the morning.     buPROPion (WELLBUTRIN SR) 150 MG 12 hr tablet Take 150 mg by mouth 2 (two) times daily.     citalopram (CELEXA) 20 MG tablet Take 20 mg by mouth every morning.     clopidogrel (PLAVIX) 75 MG tablet Take 1 tablet (75 mg total) by mouth every morning.     donepezil (ARICEPT) 5 MG tablet Take 5 mg by mouth every morning.      hydrALAZINE (APRESOLINE) 25 MG tablet Take 1 tablet (25 mg total) by mouth 2 (two) times daily. 180 tablet 1   hyoscyamine (LEVBID) 0.375 MG 12 hr tablet TAKE 1 TABLET (0.375 MG TOTAL) BY MOUTH DAILY. 90 tablet 1   isosorbide mononitrate (IMDUR) 60 MG 24 hr tablet Take 1.5 tablets (90 mg total) by mouth 2 (two) times daily. 270 tablet 3   levothyroxine (SYNTHROID) 75 MCG tablet Take 75 mcg by mouth daily.     nitroGLYCERIN (NITROSTAT) 0.4 MG SL tablet Place 1 tablet (0.4 mg total) under the tongue every 5 (five) minutes x 3 doses as needed for chest pain (  if no relief after 3rd dose, proceed to the ED for an evaluation). 25 tablet 3   rosuvastatin (CRESTOR) 20 MG tablet TAKE 1 TABLET BY MOUTH EVERY DAY (Patient taking differently: Take 20 mg by mouth every evening.) 90 tablet 1   Thiamine HCl (VITAMIN B-1 PO) Take 1 tablet by mouth daily.     No current facility-administered medications for this visit.    Allergies as of 02/03/2022 - Review Complete 02/03/2022  Allergen Reaction Noted   Ace inhibitors Cough 08/14/2008   Gemfibrozil Other (See Comments) 08/14/2008   Moxifloxacin Rash 08/14/2008   Ranexa [ranolazine] Other (See Comments) 11/10/2013    Family History  Problem Relation Age of Onset   CAD Other    Hypertension Other    Heart attack Other    Cancer Other    CAD Mother    Cancer Mother    CAD Father    Diabetes Sister    CAD Sister    Leukemia Brother     Social History   Socioeconomic History   Marital status: Married    Spouse name: Not on file   Number of children: Not on file   Years of education: Not on file   Highest education level: Not on file  Occupational History   Not on file  Tobacco Use   Smoking status: Never   Smokeless tobacco: Never  Vaping Use   Vaping Use: Never used  Substance and Sexual Activity   Alcohol use: No    Alcohol/week: 0.0 standard drinks of alcohol   Drug use: No   Sexual activity: Never  Other Topics Concern   Not  on file  Social History Narrative   Not on file   Social Determinants of Health   Financial Resource Strain: Not on file  Food Insecurity: No Food Insecurity (01/21/2022)   Hunger Vital Sign    Worried About Running Out of Food in the Last Year: Never true    Ran Out of Food in the Last Year: Never true  Transportation Needs: No Transportation Needs (01/21/2022)   PRAPARE - Hydrologist (Medical): No    Lack of Transportation (Non-Medical): No  Physical Activity: Not on file  Stress: Not on file  Social Connections: Not on file    Review of systems General: negative for malaise, night sweats, fever, chills, +weight loss  Neck: Negative for lumps, goiter, pain and significant neck swelling Resp: Negative for cough, wheezing, dyspnea at rest CV: Negative for chest pain, leg swelling, palpitations, orthopnea GI: denies melena, hematochezia, nausea, vomiting, diarrhea, odynophagia, early satiety. +weight loss +dysphagia +constipation  MSK: Negative for joint pain or swelling, back pain, and muscle pain. Derm: Negative for itching or rash Psych: Denies depression, anxiety, memory loss, confusion. No homicidal or suicidal ideation.  Heme: Negative for prolonged bleeding, bruising easily, and swollen nodes. Endocrine: Negative for cold or heat intolerance, polyuria, polydipsia and goiter. Neuro: negative for tremor, gait imbalance, syncope and seizures. The remainder of the review of systems is noncontributory.  Physical Exam: BP 111/69 (BP Location: Left Arm, Patient Position: Sitting, Cuff Size: Large)   Pulse 80   Temp (!) 97.1 F (36.2 C) (Temporal)   Ht '5\' 2"'$  (1.575 m)   Wt 147 lb (66.7 kg)   BMI 26.89 kg/m  General:   Alert and oriented. No distress noted. Pleasant and cooperative.  Head:  Normocephalic and atraumatic. Eyes:  Conjuctiva clear without scleral icterus. Mouth:  Oral mucosa pink  and moist. Good dentition. No lesions. Heart: Normal  rate and rhythm, s1 and s2 heart sounds present.  Lungs: Clear lung sounds in all lobes. Respirations equal and unlabored. Abdomen:  +BS, soft, and non-distended. Mild TTP of upper abdomen. No rebound or guarding. No HSM or masses noted. Derm: No palmar erythema or jaundice Msk:  Symmetrical without gross deformities. Normal posture. Extremities:  Without edema. Neurologic:  Alert and  oriented x4 Psych:  Alert and cooperative. Normal mood and affect.  Invalid input(s): "6 MONTHS"   ASSESSMENT: Annette Sharp is a 81 y.o. female presenting today for dysphagia and constipation.  Patient reports dysphagia mostly with thicker foods such as breads and meats for the past month or so. Denies GERD symptoms. She is having some chest pressure and SOB at times, notably had an NSTEMI at the end of October and is supposed to be following up with Dr. Harl Bowie in December. She has lost 13 pounds since March and husband reports her appetite is not good. She denies post prandial abdominal pain or early satiety, has some nausea and vomiting when riding in the car but none otherwise. She also notes constipation, going up to a week sometimes without a BM and having to manually disimpact herself. In regards to dysphagia, would recommend EGD +/- dilation, for further evaluation as we cannot rule out esophageal ring, web, stricture, stenosis. Indications, risks and benefits of procedure discussed in detail with patient. Patient verbalized understanding and is in agreement to proceed with EGD +/- dilation at this time. she will need cardiac clearance prior to any endoscopic procedures given recent cardiac event. Should implement chewing precautions and avoid thicker drier foods in the meantime. Query if decreased appetite is related to her significant constipation. Most recent TSH in August was WNL. Recommend Start taking Miralax 1 capful every day for one week. If bowel movements do not improve, increase to 1 capful every 12  hours. If after two weeks there is no improvement, increase to 1 capful every 8 hours. She should aim for good water intake (64 oz/day) and diet high in fruits and veggies.    PLAN:  EGD +/- dilation,  ASA III ENDO 3 (needs cards clearance) 2. Start miralax for constipation 3. Continue with ample water intake 4. Diet high in fruits and veggies   All questions were answered, patient verbalized understanding and is in agreement with plan as outlined above.   Follow Up: 4 months   Isidora Laham L. Alver Sorrow, MSN, APRN, AGNP-C Adult-Gerontology Nurse Practitioner [facility]  I have reviewed the note and agree with the APP's assessment as described in this progress note  Maylon Peppers, MD Gastroenterology and Hepatology [facility]

## 2022-02-03 NOTE — Telephone Encounter (Signed)
Per pre op provider Coletta Memos, FNP defer pre op clearance to Dr. Harl Bowie at appt 03/12/22.

## 2022-02-05 DIAGNOSIS — I249 Acute ischemic heart disease, unspecified: Secondary | ICD-10-CM | POA: Diagnosis not present

## 2022-02-05 DIAGNOSIS — R079 Chest pain, unspecified: Secondary | ICD-10-CM | POA: Diagnosis not present

## 2022-02-05 DIAGNOSIS — Z23 Encounter for immunization: Secondary | ICD-10-CM | POA: Diagnosis not present

## 2022-02-05 DIAGNOSIS — S00431A Contusion of right ear, initial encounter: Secondary | ICD-10-CM | POA: Diagnosis not present

## 2022-02-05 DIAGNOSIS — Z6827 Body mass index (BMI) 27.0-27.9, adult: Secondary | ICD-10-CM | POA: Diagnosis not present

## 2022-02-05 DIAGNOSIS — K2100 Gastro-esophageal reflux disease with esophagitis, without bleeding: Secondary | ICD-10-CM | POA: Diagnosis not present

## 2022-02-05 DIAGNOSIS — R5383 Other fatigue: Secondary | ICD-10-CM | POA: Diagnosis not present

## 2022-02-08 ENCOUNTER — Encounter (INDEPENDENT_AMBULATORY_CARE_PROVIDER_SITE_OTHER): Payer: Self-pay | Admitting: Gastroenterology

## 2022-02-13 ENCOUNTER — Ambulatory Visit (HOSPITAL_COMMUNITY)
Admission: RE | Admit: 2022-02-13 | Discharge: 2022-02-13 | Disposition: A | Discharge status: Home / Self Care | Payer: Medicare Other | Source: Ambulatory Visit | Attending: Specialist | Admitting: Specialist

## 2022-02-13 DIAGNOSIS — I6502 Occlusion and stenosis of left vertebral artery: Secondary | ICD-10-CM | POA: Diagnosis not present

## 2022-02-13 DIAGNOSIS — I639 Cerebral infarction, unspecified: Secondary | ICD-10-CM | POA: Diagnosis not present

## 2022-02-13 MED ORDER — GADOBUTROL 1 MMOL/ML IV SOLN
7.0000 mL | Freq: Once | INTRAVENOUS | Status: AC | PRN
  Administered 2022-02-13: 7 mL via INTRAVENOUS

## 2022-02-22 DIAGNOSIS — R5383 Other fatigue: Secondary | ICD-10-CM | POA: Diagnosis not present

## 2022-02-22 DIAGNOSIS — K2100 Gastro-esophageal reflux disease with esophagitis, without bleeding: Secondary | ICD-10-CM | POA: Diagnosis not present

## 2022-02-22 DIAGNOSIS — S00431A Contusion of right ear, initial encounter: Secondary | ICD-10-CM | POA: Diagnosis not present

## 2022-02-22 DIAGNOSIS — I249 Acute ischemic heart disease, unspecified: Secondary | ICD-10-CM | POA: Diagnosis not present

## 2022-02-22 DIAGNOSIS — R079 Chest pain, unspecified: Secondary | ICD-10-CM | POA: Diagnosis not present

## 2022-02-22 DIAGNOSIS — Z6827 Body mass index (BMI) 27.0-27.9, adult: Secondary | ICD-10-CM | POA: Diagnosis not present

## 2022-03-03 DIAGNOSIS — R413 Other amnesia: Secondary | ICD-10-CM | POA: Diagnosis not present

## 2022-03-04 ENCOUNTER — Telehealth: Payer: Self-pay | Admitting: Cardiology

## 2022-03-04 NOTE — Telephone Encounter (Signed)
   Patient Name: Annette Sharp  DOB: 09-Apr-1940 MRN: [id number]  Primary Cardiologist: Carlyle Dolly, MD  Chart reviewed as part of pre-operative protocol coverage.   Simple dental extractions (i.e. 1-2 teeth) are considered low risk procedures per guidelines and generally do not require any specific cardiac clearance. It is also generally accepted that for simple extractions and dental cleanings, there is no need to interrupt blood thinner therapy.  If pt requires additional dental procedures, pt will require separate clearance for those procedures as we cannot provide blanket clearance.   SBE prophylaxis is not required for the patient from a cardiac standpoint.  I will route this recommendation to the requesting party via Epic fax function and remove from pre-op pool.  Please call with questions.  Lenna Sciara, NP 03/04/2022, 4:36 PM

## 2022-03-04 NOTE — Telephone Encounter (Signed)
Caller stated patient will need to have cleanings and dental work and due to her medical history they need to know if the patient will need to have medications held prior to procedures going forward.

## 2022-03-04 NOTE — Telephone Encounter (Signed)
   Pre-operative Risk Assessment    Patient Name: Annette Sharp  DOB: Oct 02, 1940 MRN: [id number]    WE CANNOT PROVIDE A BLANKET TYPE CLEARANCE IF MULTIPLE Tx PLANS. WHEN READY FOR THE NEXT Tx FOR EXAMPLE SUCH AS EXTRACTIONS, CARDIOLOGIST WILL NEED TO NEED KNOW HOW MANY TEETH ARE BEING EXTRACTED (NOT THE TOOTH LOCATION IN THE MOUTH(, AS WELL AS IF SURGICAL OR SIMPLE EXTRACTIONS. WILL ALWAYS NEED TO NOW IF ANY TYPE OF ANESTHESIA TO BE USED AS WELL.   PT HAS APPT WITH THE CARDIOLOGIST 03/12/22. I WILL RUN THIS PAST OUR PRE OP PROVIDER AS WELL.  Request for Surgical Clearance    Procedure:   DENTAL CLEANING ONLY AT THIS TIME  Date of Surgery:  Clearance TBD                                 Surgeon:  DR. ABBY HAWKINS, DDS Surgeon's Group or Practice Name:   Phone number:  [phone] Fax number:  [phone]   Type of Clearance Requested:   - Medical  - Pharmacy:  Hold Aspirin and Clopidogrel (Plavix) ; DR. Luan Pulling WANTS TO KNOW AS WELL IF PT NEEDS SBE   Type of Anesthesia:  None    Additional requests/questions:    Jiles Prows   03/04/2022, 4:18 PM

## 2022-03-12 ENCOUNTER — Ambulatory Visit: Payer: Medicare Other | Attending: Cardiology | Admitting: Cardiology

## 2022-03-12 ENCOUNTER — Encounter: Payer: Self-pay | Admitting: Cardiology

## 2022-03-12 VITALS — BP 138/75 | HR 62 | Ht 62.0 in | Wt 146.0 lb

## 2022-03-12 DIAGNOSIS — I25118 Atherosclerotic heart disease of native coronary artery with other forms of angina pectoris: Secondary | ICD-10-CM

## 2022-03-12 DIAGNOSIS — E782 Mixed hyperlipidemia: Secondary | ICD-10-CM | POA: Insufficient documentation

## 2022-03-12 DIAGNOSIS — R0609 Other forms of dyspnea: Secondary | ICD-10-CM | POA: Insufficient documentation

## 2022-03-12 DIAGNOSIS — I10 Essential (primary) hypertension: Secondary | ICD-10-CM | POA: Diagnosis not present

## 2022-03-12 DIAGNOSIS — I639 Cerebral infarction, unspecified: Secondary | ICD-10-CM | POA: Diagnosis not present

## 2022-03-12 MED ORDER — ALBUTEROL SULFATE HFA 108 (90 BASE) MCG/ACT IN AERS: 2.0000 | INHALATION_SPRAY | Freq: Four times a day (QID) | RESPIRATORY_TRACT | 2 refills | Status: AC | PRN

## 2022-03-12 MED ORDER — ISOSORBIDE MONONITRATE ER 120 MG PO TB24: 120.0000 mg | ORAL_TABLET | Freq: Two times a day (BID) | ORAL | 3 refills | Status: DC

## 2022-03-12 NOTE — Patient Instructions (Signed)
Medication Instructions:  Your physician has recommended you make the following change in your medication:  Increase Imdur to 120 mg tablets twice dialy   Labwork: None  Testing/Procedures: None  Follow-Up: Follow up with Dr. Harl Bowie in 6 months.   Any Other Special Instructions Will Be Listed Below (If Applicable).     If you need a refill on your cardiac medications before your next appointment, please call your pharmacy.

## 2022-03-12 NOTE — Progress Notes (Signed)
Clinical Summary Annette Sharp is a 81 y.o.female seen today for follow up of the following medical problems.    1. CAD - Prior DES to RCA in 2007.  - cath Jan 2020 without significant disease   - at prior visit reprotedchest pain. Pulling like pain in midchest into right shoulder, has had prior shoulder replacements - usually occurs with activity.Not positional. Lasts about 10 minutes. Some SOB, feels dizzy/lightheaded. - highest level of activity is house work, or walking to [employer].  - better with with SL NG   01/2020 nuclear stress: no ischemia, short runs of atach.    11/2020 echo: LVEF 65-70%, grade II dd, normal RV, mid MR, mild AI   12/2020 ER visit [facility] with chest pain - from notes described right sided pleuritic chest pain -  Trop neg x 2. CXR no acute process. CTA chest no PE, atelectasis.  - no other chest pains. Chronic SOB unchanged, chronic fatigue.  - Jan 2023 PFTs: mild diffusion defect   - admit 12/2021 with chest pain, trop up to 234. EKG without acute ischemic changes - 12/2021 echo: LVEF 60-65% no WMAs - managed medically given mild trop elevation, advanced age, severe dementia, and comorbidities  - occasional symptoms, 1-2 times per week.    2. HTN -  visit 10/2019 with PA Leonides Sake hydralazine '25mg'$  bid added - she is compliant with meds     3. OSA - not able to tolerate cpap     4. Hyperlipidemia - 08/2018 TC 157 HDL 61 TG LD 82  - labs followed by pcp - compliant with crestor '20mg'$  daily  12/2021 LDL 66    5. History of prior CVA - has been on plavix.     6. Dental extractions - upcoming dental cleaning, endoscopy   7. Dementia Past Medical History:  Diagnosis Date   Anxiety    Back pain    Cerebrovascular disease    NONOBSTRUCTIVE CVA BY CAROTID DOPPLERS OCTOBER 2008   Chronic renal insufficiency    GFR 20 ML'S PER MINUTE   COPD (chronic obstructive pulmonary disease) (HCC)    Coronary artery disease    a. s/p DES to RCA  in 2007 b. patent stent by cath in 12/2014 with nonobstructive disease along LAD and RCA   Coronary atherosclerosis of native coronary artery    NEGATIVE LEXISCAN FOR ISCHEMIA MARCH 23,2010 NORMAL LV FUNCTTION   Depression    Dyspnea    with exertion   GERD (gastroesophageal reflux disease)    Headache    chronic headache from fall in December   Hyperlipidemia    Hypertension    Hypothyroidism    Other and unspecified angina pectoris    ANGINA   Overweight(278.02)    OBESITY   Peptic ulcer disease    Primary localized osteoarthritis of left knee    Sleep apnea    SEVERE OBSTRUCTIVE    cpap   Stroke ([place])    affected memory   Thyroid disease    TREATED HYPOTHYROIDISM     Allergies  Allergen Reactions   Ace Inhibitors Cough   Gemfibrozil Other (See Comments)    Muscle Aches   Moxifloxacin Rash   Ranexa [Ranolazine] Other (See Comments)    "NIGHTMARES"     Current Outpatient Medications  Medication Sig Dispense Refill   acyclovir (ZOVIRAX) 400 MG tablet Take 400 mg by mouth in the morning.     amLODipine (NORVASC) 10 MG tablet Take 10  mg by mouth every morning.  3   aspirin EC 81 MG tablet Take 81 mg by mouth in the morning.     buPROPion (WELLBUTRIN SR) 150 MG 12 hr tablet Take 150 mg by mouth 2 (two) times daily.     citalopram (CELEXA) 20 MG tablet Take 20 mg by mouth every morning.     clopidogrel (PLAVIX) 75 MG tablet Take 1 tablet (75 mg total) by mouth every morning.     donepezil (ARICEPT) 5 MG tablet Take 5 mg by mouth every morning.     hydrALAZINE (APRESOLINE) 25 MG tablet Take 1 tablet (25 mg total) by mouth 2 (two) times daily. 180 tablet 1   hyoscyamine (LEVBID) 0.375 MG 12 hr tablet TAKE 1 TABLET (0.375 MG TOTAL) BY MOUTH DAILY. 90 tablet 1   isosorbide mononitrate (IMDUR) 120 MG 24 hr tablet Take 1 tablet (120 mg total) by mouth in the morning and at bedtime. 180 tablet 3   levothyroxine (SYNTHROID) 75 MCG tablet Take 75 mcg by mouth daily.      nitroGLYCERIN (NITROSTAT) 0.4 MG SL tablet Place 1 tablet (0.4 mg total) under the tongue every 5 (five) minutes x 3 doses as needed for chest pain (if no relief after 3rd dose, proceed to the ED for an evaluation). 25 tablet 3   rosuvastatin (CRESTOR) 20 MG tablet TAKE 1 TABLET BY MOUTH EVERY DAY (Patient taking differently: Take 20 mg by mouth every evening.) 90 tablet 1   Thiamine HCl (VITAMIN B-1 PO) Take 1 tablet by mouth daily.     albuterol (VENTOLIN HFA) 108 (90 Base) MCG/ACT inhaler Inhale 2 puffs into the lungs every 6 (six) hours as needed for wheezing or shortness of breath. 1 each 2   No current facility-administered medications for this visit.     Past Surgical History:  Procedure Laterality Date   ABDOMINAL HYSTERECTOMY     BIOPSY  06/18/2021   Procedure: BIOPSY;  Surgeon: Rogene Houston, MD;  [facility];  Service: Endoscopy;;   CARDIAC CATHETERIZATION N/A 01/22/2015   Procedure: Right/Left Heart Cath and Coronary Angiography;  Surgeon: Wellington Hampshire, MD;  Location: [place] [facility];  Service: Cardiovascular;  Laterality: N/A;   CARPAL TUNNEL RELEASE     CHOLECYSTECTOMY     COLONOSCOPY N/A 03/31/2018   Procedure: COLONOSCOPY;  Surgeon: Rogene Houston, MD;  [facility];  Service: Endoscopy;  Laterality: N/A;  2:45   COLONOSCOPY WITH PROPOFOL N/A 06/18/2021   Procedure: COLONOSCOPY WITH PROPOFOL;  Surgeon: Rogene Houston, MD;  [facility];  Service: Endoscopy;  Laterality: N/A;  11:00   JOINT REPLACEMENT     LEFT HEART CATH AND CORONARY ANGIOGRAPHY N/A 04/28/2018   Procedure: LEFT HEART CATH AND CORONARY ANGIOGRAPHY;  Surgeon: Belva Crome, MD;  Location: [place] [facility];  Service: Cardiovascular;  Laterality: N/A;   POLYPECTOMY  03/31/2018   Procedure: POLYPECTOMY;  Surgeon: Rogene Houston, MD;  [facility];  Service: Endoscopy;;  splen. flex, cecum   POLYPECTOMY  06/18/2021   Procedure: POLYPECTOMY;  Surgeon:  Rogene Houston, MD;  [facility];  Service: Endoscopy;;   REPLACEMENT TOTAL KNEE Right 07/28/2010   ROTATOR CUFF REPAIR Right 07/08/2009   TOTAL ABDOMINAL HYSTERECTOMY W/ BILATERAL SALPINGOOPHORECTOMY     TOTAL KNEE ARTHROPLASTY Left 04/08/2015   Procedure: LEFT TOTAL KNEE ARTHROPLASTY;  Surgeon: Elsie Saas, MD;  Location: [place];  Service: Orthopedics;  Laterality:  Left;   TOTAL SHOULDER ARTHROPLASTY Left 06/02/2017   Procedure: TOTAL SHOULDER ARTHROPLASTY;  Surgeon: Hiram Gash, MD;  Location: [place];  Service: Orthopedics;  Laterality: Left;     Allergies  Allergen Reactions   Ace Inhibitors Cough   Gemfibrozil Other (See Comments)    Muscle Aches   Moxifloxacin Rash   Ranexa [Ranolazine] Other (See Comments)    "NIGHTMARES"      Family History  Problem Relation Age of Onset   CAD Other    Hypertension Other    Heart attack Other    Cancer Other    CAD Mother    Cancer Mother    CAD Father    Diabetes Sister    CAD Sister    Leukemia Brother      Social History Ms. Khamis reports that she has never smoked. She has never used smokeless tobacco. Ms. Schwalb reports no history of alcohol use.   Review of Systems CONSTITUTIONAL: No weight loss, fever, chills, weakness or fatigue.  HEENT: Eyes: No visual loss, blurred vision, double vision or yellow sclerae.No hearing loss, sneezing, congestion, runny nose or sore throat.  SKIN: No rash or itching.  CARDIOVASCULAR: per hpi RESPIRATORY: No shortness of breath, cough or sputum.  GASTROINTESTINAL: No anorexia, nausea, vomiting or diarrhea. No abdominal pain or blood.  GENITOURINARY: No burning on urination, no polyuria NEUROLOGICAL: No headache, dizziness, syncope, paralysis, ataxia, numbness or tingling in the extremities. No change in bowel or bladder control.  MUSCULOSKELETAL: No muscle, back pain, joint pain or stiffness.  LYMPHATICS: No enlarged nodes. No history of splenectomy.  PSYCHIATRIC: No history of  depression or anxiety.  ENDOCRINOLOGIC: No reports of sweating, cold or heat intolerance. No polyuria or polydipsia.  Marland Kitchen   Physical Examination Today's Vitals   03/12/22 1348 03/12/22 1429  BP: (!) 148/70 138/75  Pulse: 62   SpO2: 98%   Weight: 146 lb (66.2 kg)   Height: '5\' 2"'$  (1.575 m)    Body mass index is 26.7 kg/m.  Gen: resting comfortably, no acute distress HEENT: no scleral icterus, pupils equal round and reactive, no palptable cervical adenopathy,  CV: RRR, no m/r/ gno jvd Resp: Clear to auscultation bilaterally GI: abdomen is soft, non-tender, non-distended, normal bowel sounds, no hepatosplenomegaly MSK: extremities are warm, no edema.  Skin: warm, no rash Neuro:  no focal deficits Psych: appropriate affect   Diagnostic Studies  Cardiac catheterization May 08, 2018:   Moderate three-vessel coronary disease, with diffuse proximal to mid calcification in all territories. Left main 25% diffuse narrowing Proximal to mid LAD 40 to 50%.  Unchanged from prior angiogram Proximal 30% circumflex.  First OM is small with irregularities. RCA contains a proximal to ostial stent that has 30% proximal narrowing.  Luminal irregularities are noted throughout the mid right coronary.  No significant obstruction is seen. Normal left ventricular function with EF greater than 50%.  LVEDP is normal.     01/2020 nuclear stress There was no ST segment deviation noted during stress. Short runs of atrial tachycardia with just mildly elevated rates around 110 The study is normal. There are no perfusion defects This is a low risk study. The left ventricular ejection fraction is hyperdynamic (>65%). Nonspecific finding of elevated TID in absence of perfusion defects   11/2020 echo IMPRESSIONS     1. Left ventricular ejection fraction, by estimation, is 65 to 70%. The  left ventricle has normal function. The left ventricle has no regional  wall motion abnormalities. There is  mild left  ventricular hypertrophy.  Left ventricular diastolic parameters  are consistent with Grade II diastolic dysfunction (pseudonormalization).  Elevated left ventricular end-diastolic pressure.   2. Right ventricular systolic function is normal. The right ventricular  size is normal. There is normal pulmonary artery systolic pressure. The  estimated right ventricular systolic pressure is 16.5 mmHg.   3. Left atrial size was mildly dilated.   4. There is a trivial pericardial effusion posterior to the left  ventricle.   5. The mitral valve is abnormal. Mild mitral valve regurgitation.   6. The aortic valve is tricuspid. There is mild calcification of the  aortic valve. Aortic valve regurgitation is mild. Mild to moderate aortic  valve sclerosis/calcification is present, without any evidence of aortic  stenosis. Aortic regurgitation PHT  measures 465 msec.   7. The inferior vena cava is normal in size with greater than 50%  respiratory variability, suggesting right atrial pressure of 3 mmHg.   Jan 2023 PFTs: mild diffusion defect  Assessment and Plan   1. CAD with other forms of angina - some recent chest pains, will increase imdur to '120mg'$  bid. Not a cath candidate due to severe advanced dementia, recent NSTEMI managed medically.    2. HTN -mildly elevated, see how responds to imdur increase reported above  3. Hyperlipidemia -at goal, continue current meds   F/u 6 months  Arnoldo Lenis, M.D.

## 2022-03-27 NOTE — Telephone Encounter (Signed)
   Patient Name: Annette Sharp  DOB: May 02, 1940 MRN: [id number]  Primary Cardiologist: Carlyle Dolly, MD  Chart reviewed as part of pre-operative protocol coverage. Given past medical history and time since last visit, based on ACC/AHA guidelines, and per Dr. Harl Bowie, primary cardiologist, [name] is at acceptable risk for the planned procedure without further cardiovascular testing.   Per Dr. Harl Bowie, who saw pt in office on 03/12/2022, "Low risk procedure, would be fine to proceed. Can hold plavix and aspirin as needed.     Zandra Abts MD"  I will route this recommendation to the requesting party via Epic fax function and remove from pre-op pool.  Please call with questions.  Lenna Sciara, NP 03/27/2022, 10:52 AM

## 2022-03-27 NOTE — Telephone Encounter (Signed)
Low risk procedure, would be fine to proceed. Can hold plavix and aspirin as needed   Zandra Abts MD

## 2022-04-15 DIAGNOSIS — G309 Alzheimer's disease, unspecified: Secondary | ICD-10-CM | POA: Diagnosis not present

## 2022-04-21 DIAGNOSIS — H903 Sensorineural hearing loss, bilateral: Secondary | ICD-10-CM | POA: Diagnosis not present

## 2022-04-24 ENCOUNTER — Ambulatory Visit (HOSPITAL_COMMUNITY)
Admission: RE | Admit: 2022-04-24 | Discharge: 2022-04-24 | Disposition: A | Discharge status: Home / Self Care | Payer: Medicare Other | Source: Ambulatory Visit | Attending: Neurosurgery | Admitting: Neurosurgery

## 2022-04-24 DIAGNOSIS — R569 Unspecified convulsions: Secondary | ICD-10-CM | POA: Insufficient documentation

## 2022-04-24 NOTE — Progress Notes (Signed)
EEG complete - results pending 

## 2022-04-24 NOTE — Procedures (Signed)
Patient Name: Annette Sharp  MRN: [id number]  Epilepsy Attending: Lora Havens  Referring Physician/Provider: Boyd Kerbs, MD  Date: 04/24/2022 Duration: 26.47 mins  Patient history: 82 year old female getting EEG to evaluate for seizure.  Level of alertness: Awake, asleep  AEDs during EEG study:   Technical aspects: This EEG study was done with scalp electrodes positioned according to the 10-20 International system of electrode placement. Electrical activity was reviewed with band pass filter of 1-'70Hz'$ , sensitivity of 7 uV/mm, display speed of 87m/sec with a '60Hz'$  notched filter applied as appropriate. EEG data were recorded continuously and digitally stored.  Video monitoring was available and reviewed as appropriate.  Description: The posterior dominant rhythm consists of 8 Hz activity of moderate voltage (25-35 uV) seen predominantly in posterior head regions, symmetric and reactive to eye opening and eye closing. Sleep was characterized by vertex waves, sleep spindles (12 to 14 Hz), maximal frontocentral region.  Physiologic photic driving was not seen during photic stimulation.  Hyperventilation was not performed.     IMPRESSION: This study is within normal limits. No seizures or epileptiform discharges were seen throughout the recording.  A normal interictal EEG does not exclude the diagnosis of epilepsy.  [name] OBarbra Sarks

## 2022-04-28 DIAGNOSIS — I252 Old myocardial infarction: Secondary | ICD-10-CM | POA: Diagnosis not present

## 2022-04-28 DIAGNOSIS — Z7902 Long term (current) use of antithrombotics/antiplatelets: Secondary | ICD-10-CM | POA: Diagnosis not present

## 2022-04-28 DIAGNOSIS — I10 Essential (primary) hypertension: Secondary | ICD-10-CM | POA: Diagnosis not present

## 2022-04-28 DIAGNOSIS — Z79890 Hormone replacement therapy (postmenopausal): Secondary | ICD-10-CM | POA: Diagnosis not present

## 2022-04-28 DIAGNOSIS — R079 Chest pain, unspecified: Secondary | ICD-10-CM | POA: Diagnosis not present

## 2022-04-28 DIAGNOSIS — F29 Unspecified psychosis not due to a substance or known physiological condition: Secondary | ICD-10-CM | POA: Diagnosis not present

## 2022-04-28 DIAGNOSIS — I2510 Atherosclerotic heart disease of native coronary artery without angina pectoris: Secondary | ICD-10-CM | POA: Diagnosis not present

## 2022-04-28 DIAGNOSIS — J449 Chronic obstructive pulmonary disease, unspecified: Secondary | ICD-10-CM | POA: Diagnosis not present

## 2022-04-28 DIAGNOSIS — E785 Hyperlipidemia, unspecified: Secondary | ICD-10-CM | POA: Diagnosis not present

## 2022-04-28 DIAGNOSIS — R4182 Altered mental status, unspecified: Secondary | ICD-10-CM | POA: Diagnosis not present

## 2022-04-28 DIAGNOSIS — F05 Delirium due to known physiological condition: Secondary | ICD-10-CM | POA: Diagnosis not present

## 2022-04-28 DIAGNOSIS — Z7982 Long term (current) use of aspirin: Secondary | ICD-10-CM | POA: Diagnosis not present

## 2022-04-28 DIAGNOSIS — R410 Disorientation, unspecified: Secondary | ICD-10-CM | POA: Diagnosis not present

## 2022-04-28 DIAGNOSIS — Z79899 Other long term (current) drug therapy: Secondary | ICD-10-CM | POA: Diagnosis not present

## 2022-04-28 DIAGNOSIS — E079 Disorder of thyroid, unspecified: Secondary | ICD-10-CM | POA: Diagnosis not present

## 2022-04-29 ENCOUNTER — Inpatient Hospital Stay (HOSPITAL_COMMUNITY): Payer: Medicare Other

## 2022-04-29 ENCOUNTER — Inpatient Hospital Stay (HOSPITAL_COMMUNITY)
Admit: 2022-04-29 | Discharge: 2022-05-01 | DRG: 280 | Disposition: A | Discharge status: Home / Self Care | Payer: Medicare Other | Source: Ambulatory Visit | Attending: Internal Medicine | Admitting: Internal Medicine

## 2022-04-29 ENCOUNTER — Encounter (HOSPITAL_COMMUNITY): Payer: Self-pay

## 2022-04-29 DIAGNOSIS — E079 Disorder of thyroid, unspecified: Secondary | ICD-10-CM | POA: Diagnosis not present

## 2022-04-29 DIAGNOSIS — Z806 Family history of leukemia: Secondary | ICD-10-CM

## 2022-04-29 DIAGNOSIS — Z833 Family history of diabetes mellitus: Secondary | ICD-10-CM | POA: Diagnosis not present

## 2022-04-29 DIAGNOSIS — Z7982 Long term (current) use of aspirin: Secondary | ICD-10-CM | POA: Diagnosis not present

## 2022-04-29 DIAGNOSIS — Z79899 Other long term (current) drug therapy: Secondary | ICD-10-CM | POA: Diagnosis not present

## 2022-04-29 DIAGNOSIS — G9340 Encephalopathy, unspecified: Secondary | ICD-10-CM | POA: Diagnosis not present

## 2022-04-29 DIAGNOSIS — G9341 Metabolic encephalopathy: Secondary | ICD-10-CM | POA: Diagnosis not present

## 2022-04-29 DIAGNOSIS — I252 Old myocardial infarction: Secondary | ICD-10-CM | POA: Diagnosis not present

## 2022-04-29 DIAGNOSIS — Z79890 Hormone replacement therapy (postmenopausal): Secondary | ICD-10-CM

## 2022-04-29 DIAGNOSIS — Z955 Presence of coronary angioplasty implant and graft: Secondary | ICD-10-CM

## 2022-04-29 DIAGNOSIS — K219 Gastro-esophageal reflux disease without esophagitis: Secondary | ICD-10-CM | POA: Diagnosis present

## 2022-04-29 DIAGNOSIS — E785 Hyperlipidemia, unspecified: Secondary | ICD-10-CM | POA: Diagnosis not present

## 2022-04-29 DIAGNOSIS — F01A18 Vascular dementia, mild, with other behavioral disturbance: Secondary | ICD-10-CM

## 2022-04-29 DIAGNOSIS — G4733 Obstructive sleep apnea (adult) (pediatric): Secondary | ICD-10-CM | POA: Diagnosis not present

## 2022-04-29 DIAGNOSIS — F0150 Vascular dementia without behavioral disturbance: Secondary | ICD-10-CM | POA: Diagnosis present

## 2022-04-29 DIAGNOSIS — I214 Non-ST elevation (NSTEMI) myocardial infarction: Secondary | ICD-10-CM | POA: Diagnosis not present

## 2022-04-29 DIAGNOSIS — R079 Chest pain, unspecified: Secondary | ICD-10-CM | POA: Diagnosis not present

## 2022-04-29 DIAGNOSIS — F32A Depression, unspecified: Secondary | ICD-10-CM | POA: Diagnosis not present

## 2022-04-29 DIAGNOSIS — Z881 Allergy status to other antibiotic agents status: Secondary | ICD-10-CM

## 2022-04-29 DIAGNOSIS — Z96653 Presence of artificial knee joint, bilateral: Secondary | ICD-10-CM | POA: Diagnosis present

## 2022-04-29 DIAGNOSIS — I10 Essential (primary) hypertension: Secondary | ICD-10-CM | POA: Diagnosis present

## 2022-04-29 DIAGNOSIS — E039 Hypothyroidism, unspecified: Secondary | ICD-10-CM | POA: Diagnosis present

## 2022-04-29 DIAGNOSIS — J449 Chronic obstructive pulmonary disease, unspecified: Secondary | ICD-10-CM | POA: Diagnosis not present

## 2022-04-29 DIAGNOSIS — E782 Mixed hyperlipidemia: Secondary | ICD-10-CM

## 2022-04-29 DIAGNOSIS — Z8673 Personal history of transient ischemic attack (TIA), and cerebral infarction without residual deficits: Secondary | ICD-10-CM

## 2022-04-29 DIAGNOSIS — I25119 Atherosclerotic heart disease of native coronary artery with unspecified angina pectoris: Secondary | ICD-10-CM | POA: Diagnosis present

## 2022-04-29 DIAGNOSIS — Z8249 Family history of ischemic heart disease and other diseases of the circulatory system: Secondary | ICD-10-CM | POA: Diagnosis not present

## 2022-04-29 DIAGNOSIS — I2510 Atherosclerotic heart disease of native coronary artery without angina pectoris: Secondary | ICD-10-CM | POA: Diagnosis not present

## 2022-04-29 DIAGNOSIS — F01C0 Vascular dementia, severe, without behavioral disturbance, psychotic disturbance, mood disturbance, and anxiety: Secondary | ICD-10-CM | POA: Diagnosis present

## 2022-04-29 DIAGNOSIS — R4182 Altered mental status, unspecified: Secondary | ICD-10-CM | POA: Diagnosis not present

## 2022-04-29 DIAGNOSIS — Z7902 Long term (current) use of antithrombotics/antiplatelets: Secondary | ICD-10-CM | POA: Diagnosis not present

## 2022-04-29 DIAGNOSIS — Z96612 Presence of left artificial shoulder joint: Secondary | ICD-10-CM | POA: Diagnosis present

## 2022-04-29 DIAGNOSIS — R410 Disorientation, unspecified: Secondary | ICD-10-CM | POA: Diagnosis not present

## 2022-04-29 DIAGNOSIS — Z888 Allergy status to other drugs, medicaments and biological substances status: Secondary | ICD-10-CM | POA: Diagnosis not present

## 2022-04-29 LAB — CBC WITH DIFFERENTIAL/PLATELET
Abs Immature Granulocytes: 0.02 10*3/uL (ref 0.00–0.07)
Basophils Absolute: 0 10*3/uL (ref 0.0–0.1)
Basophils Relative: 1 %
Eosinophils Absolute: 0.1 10*3/uL (ref 0.0–0.5)
Eosinophils Relative: 2 %
HCT: 36.6 % (ref 36.0–46.0)
Hemoglobin: 12.6 g/dL (ref 12.0–15.0)
Immature Granulocytes: 0 %
Lymphocytes Relative: 27 %
Lymphs Abs: 1.6 10*3/uL (ref 0.7–4.0)
MCH: 31.8 pg (ref 26.0–34.0)
MCHC: 34.4 g/dL (ref 30.0–36.0)
MCV: 92.4 fL (ref 80.0–100.0)
Monocytes Absolute: 0.8 10*3/uL (ref 0.1–1.0)
Monocytes Relative: 13 %
Neutro Abs: 3.3 10*3/uL (ref 1.7–7.7)
Neutrophils Relative %: 57 %
Platelets: 167 10*3/uL (ref 150–400)
RBC: 3.96 MIL/uL (ref 3.87–5.11)
RDW: 13 % (ref 11.5–15.5)
WBC: 5.8 10*3/uL (ref 4.0–10.5)
nRBC: 0 % (ref 0.0–0.2)

## 2022-04-29 LAB — TSH: TSH: 3.807 u[IU]/mL (ref 0.350–4.500)

## 2022-04-29 LAB — COMPREHENSIVE METABOLIC PANEL
ALT: 55 U/L — ABNORMAL HIGH (ref 0–44)
AST: 51 U/L — ABNORMAL HIGH (ref 15–41)
Albumin: 3.6 g/dL (ref 3.5–5.0)
Alkaline Phosphatase: 48 U/L (ref 38–126)
Anion gap: 8 (ref 5–15)
BUN: 10 mg/dL (ref 8–23)
CO2: 24 mmol/L (ref 22–32)
Calcium: 8.7 mg/dL — ABNORMAL LOW (ref 8.9–10.3)
Chloride: 104 mmol/L (ref 98–111)
Creatinine, Ser: 1.16 mg/dL — ABNORMAL HIGH (ref 0.44–1.00)
GFR, Estimated: 48 mL/min — ABNORMAL LOW (ref >60)
Glucose, Bld: 98 mg/dL (ref 70–99)
Potassium: 3.7 mmol/L (ref 3.5–5.1)
Sodium: 136 mmol/L (ref 135–145)
Total Bilirubin: 0.5 mg/dL (ref 0.3–1.2)
Total Protein: 6.3 g/dL — ABNORMAL LOW (ref 6.5–8.1)

## 2022-04-29 LAB — TROPONIN I (HIGH SENSITIVITY)
Troponin I (High Sensitivity): 207 ng/L (ref <18)
Troponin I (High Sensitivity): 212 ng/L (ref <18)

## 2022-04-29 LAB — PROTIME-INR
INR: 1.1 (ref 0.8–1.2)
Prothrombin Time: 14 seconds (ref 11.4–15.2)

## 2022-04-29 LAB — HEPARIN LEVEL (UNFRACTIONATED): Heparin Unfractionated: 0.32 IU/mL (ref 0.30–0.70)

## 2022-04-29 MED ORDER — ALBUTEROL SULFATE (2.5 MG/3ML) 0.083% IN NEBU: 2.5000 mg | INHALATION_SOLUTION | Freq: Four times a day (QID) | RESPIRATORY_TRACT | Status: DC | PRN

## 2022-04-29 MED ORDER — ASPIRIN 81 MG PO TBEC
81.0000 mg | DELAYED_RELEASE_TABLET | Freq: Every morning | ORAL | Status: DC
  Administered 2022-04-30 – 2022-05-01 (×2): 81 mg via ORAL
  Filled 2022-04-29 (×2): qty 1

## 2022-04-29 MED ORDER — ONDANSETRON HCL 4 MG/2ML IJ SOLN: 4.0000 mg | Freq: Four times a day (QID) | INTRAMUSCULAR | Status: DC | PRN

## 2022-04-29 MED ORDER — CLOPIDOGREL BISULFATE 75 MG PO TABS
75.0000 mg | ORAL_TABLET | Freq: Every morning | ORAL | Status: DC
  Administered 2022-04-30 – 2022-05-01 (×2): 75 mg via ORAL
  Filled 2022-04-29 (×2): qty 1

## 2022-04-29 MED ORDER — HEPARIN (PORCINE) 25000 UT/250ML-% IV SOLN
900.0000 [IU]/h | INTRAVENOUS | Status: DC
  Administered 2022-04-29: 850 [IU]/h via INTRAVENOUS
  Administered 2022-05-01: 900 [IU]/h via INTRAVENOUS
  Filled 2022-04-29 (×2): qty 250

## 2022-04-29 MED ORDER — HYDRALAZINE HCL 25 MG PO TABS
25.0000 mg | ORAL_TABLET | Freq: Two times a day (BID) | ORAL | Status: DC
  Administered 2022-04-29 – 2022-04-30 (×2): 25 mg via ORAL
  Filled 2022-04-29 (×2): qty 1

## 2022-04-29 MED ORDER — ACETAMINOPHEN 325 MG PO TABS: 650.0000 mg | ORAL_TABLET | ORAL | Status: DC | PRN

## 2022-04-29 MED ORDER — CITALOPRAM HYDROBROMIDE 20 MG PO TABS
20.0000 mg | ORAL_TABLET | Freq: Every morning | ORAL | Status: DC
  Administered 2022-04-30 – 2022-05-01 (×2): 20 mg via ORAL
  Filled 2022-04-29 (×2): qty 1

## 2022-04-29 MED ORDER — ACYCLOVIR 400 MG PO TABS
400.0000 mg | ORAL_TABLET | Freq: Every morning | ORAL | Status: DC
  Administered 2022-04-30 – 2022-05-01 (×2): 400 mg via ORAL
  Filled 2022-04-29 (×2): qty 1

## 2022-04-29 MED ORDER — ALBUTEROL SULFATE HFA 108 (90 BASE) MCG/ACT IN AERS: 2.0000 | INHALATION_SPRAY | Freq: Four times a day (QID) | RESPIRATORY_TRACT | Status: DC | PRN

## 2022-04-29 MED ORDER — DONEPEZIL HCL 5 MG PO TABS
5.0000 mg | ORAL_TABLET | Freq: Every morning | ORAL | Status: DC
  Administered 2022-04-30 – 2022-05-01 (×2): 5 mg via ORAL
  Filled 2022-04-29 (×2): qty 1

## 2022-04-29 MED ORDER — ROSUVASTATIN CALCIUM 20 MG PO TABS
20.0000 mg | ORAL_TABLET | Freq: Every day | ORAL | Status: DC
  Administered 2022-04-30 – 2022-05-01 (×2): 20 mg via ORAL
  Filled 2022-04-29 (×2): qty 1

## 2022-04-29 MED ORDER — THIAMINE MONONITRATE 100 MG PO TABS
100.0000 mg | ORAL_TABLET | Freq: Every day | ORAL | Status: DC
  Administered 2022-04-30 – 2022-05-01 (×2): 100 mg via ORAL
  Filled 2022-04-29 (×2): qty 1

## 2022-04-29 MED ORDER — LEVOTHYROXINE SODIUM 75 MCG PO TABS
75.0000 ug | ORAL_TABLET | Freq: Every day | ORAL | Status: DC
  Administered 2022-04-30 – 2022-05-01 (×2): 75 ug via ORAL
  Filled 2022-04-29 (×2): qty 1

## 2022-04-29 MED ORDER — ISOSORBIDE MONONITRATE ER 60 MG PO TB24
120.0000 mg | ORAL_TABLET | Freq: Every day | ORAL | Status: DC
  Administered 2022-04-30 – 2022-05-01 (×2): 120 mg via ORAL
  Filled 2022-04-29 (×3): qty 2

## 2022-04-29 MED ORDER — BUPROPION HCL ER (SR) 150 MG PO TB12
150.0000 mg | ORAL_TABLET | Freq: Two times a day (BID) | ORAL | Status: DC
  Administered 2022-04-29 – 2022-05-01 (×4): 150 mg via ORAL
  Filled 2022-04-29 (×5): qty 1

## 2022-04-29 MED ORDER — AMLODIPINE BESYLATE 10 MG PO TABS
10.0000 mg | ORAL_TABLET | Freq: Every morning | ORAL | Status: DC
  Administered 2022-04-30 – 2022-05-01 (×2): 10 mg via ORAL
  Filled 2022-04-29 (×2): qty 1

## 2022-04-29 MED ORDER — NITROGLYCERIN 0.4 MG SL SUBL: 0.4000 mg | SUBLINGUAL_TABLET | SUBLINGUAL | Status: DC | PRN

## 2022-04-29 NOTE — Progress Notes (Addendum)
ANTICOAGULATION CONSULT NOTE - Initial Consult  Pharmacy Consult for Heparin Indication: chest pain/ACS  Allergies  Allergen Reactions   Ace Inhibitors Cough   Gemfibrozil Other (See Comments)    Muscle Aches   Moxifloxacin Rash   Ranexa [Ranolazine] Other (See Comments)    "NIGHTMARES"    Patient Measurements: Height: '5\' 2"'$  (157.5 cm) Weight: 76.5 kg (168 lb 11.2 oz) IBW/kg (Calculated) : 50.1 Heparin Dosing Weight: 67 kg  Vital Signs: Temp: 98.3 F (36.8 C) (01/31 1945) Temp Source: Oral (01/31 1945) BP: 154/54 (01/31 1945) Pulse Rate: 55 (01/31 1945)  Labs: No results for input(s): "HGB", "HCT", "PLT", "APTT", "LABPROT", "INR", "HEPARINUNFRC", "HEPRLOWMOCWT", "CREATININE", "CKTOTAL", "CKMB", "TROPONINIHS" in the last 72 hours.  CrCl cannot be calculated (Patient's most recent lab result is older than the maximum 21 days allowed.).   Medical History: Past Medical History:  Diagnosis Date   Anxiety    Back pain    Cerebrovascular disease    NONOBSTRUCTIVE CVA BY CAROTID DOPPLERS OCTOBER 2008   Chronic renal insufficiency    GFR 55 ML'S PER MINUTE   COPD (chronic obstructive pulmonary disease) (HCC)    Coronary artery disease    a. s/p DES to RCA in 2007 b. patent stent by cath in 12/2014 with nonobstructive disease along LAD and RCA   Coronary atherosclerosis of native coronary artery    NEGATIVE LEXISCAN FOR ISCHEMIA MARCH 23,2010 NORMAL LV FUNCTTION   Depression    Dyspnea    with exertion   GERD (gastroesophageal reflux disease)    Headache    chronic headache from fall in December   Hyperlipidemia    Hypertension    Hypothyroidism    Other and unspecified angina pectoris    ANGINA   Overweight(278.02)    OBESITY   Peptic ulcer disease    Primary localized osteoarthritis of left knee    Sleep apnea    SEVERE OBSTRUCTIVE    cpap   Stroke ([place])    affected memory   Thyroid disease    TREATED HYPOTHYROIDISM    Medications:  Medications Prior to  Admission  Medication Sig Dispense Refill Last Dose   acyclovir (ZOVIRAX) 400 MG tablet Take 400 mg by mouth in the morning.   04/28/2022   albuterol (VENTOLIN HFA) 108 (90 Base) MCG/ACT inhaler Inhale 2 puffs into the lungs every 6 (six) hours as needed for wheezing or shortness of breath. 1 each 2 Past Week   amLODipine (NORVASC) 10 MG tablet Take 10 mg by mouth every morning.  3 04/28/2022   aspirin EC 81 MG tablet Take 81 mg by mouth in the morning.   04/28/2022   buPROPion (WELLBUTRIN SR) 150 MG 12 hr tablet Take 150 mg by mouth 2 (two) times daily.   04/28/2022   citalopram (CELEXA) 20 MG tablet Take 20 mg by mouth every morning.   04/28/2022   clopidogrel (PLAVIX) 75 MG tablet Take 1 tablet (75 mg total) by mouth every morning.   04/28/2022   donepezil (ARICEPT) 5 MG tablet Take 5 mg by mouth every morning.   04/28/2022   hydrALAZINE (APRESOLINE) 25 MG tablet Take 1 tablet (25 mg total) by mouth 2 (two) times daily. 180 tablet 1 04/28/2022   hyoscyamine (LEVBID) 0.375 MG 12 hr tablet TAKE 1 TABLET (0.375 MG TOTAL) BY MOUTH DAILY. 90 tablet 1 04/28/2022   isosorbide mononitrate (IMDUR) 120 MG 24 hr tablet Take 1 tablet (120 mg total) by mouth in the morning and at bedtime.  180 tablet 3 04/28/2022   levothyroxine (SYNTHROID) 75 MCG tablet Take 75 mcg by mouth daily.   04/28/2022   nitroGLYCERIN (NITROSTAT) 0.4 MG SL tablet Place 1 tablet (0.4 mg total) under the tongue every 5 (five) minutes x 3 doses as needed for chest pain (if no relief after 3rd dose, proceed to the ED for an evaluation). (Patient taking differently: Place 0.4 mg under the tongue every 5 (five) minutes x 3 doses as needed for chest pain.) 25 tablet 3 unknown   rosuvastatin (CRESTOR) 20 MG tablet TAKE 1 TABLET BY MOUTH EVERY DAY (Patient taking differently: Take 20 mg by mouth every evening.) 90 tablet 1 04/28/2022   Thiamine HCl (VITAMIN B-1 PO) Take 1 tablet by mouth daily.   04/28/2022    Assessment: 82 y.o. F presented to [facility] 1/30 with AMS. Pt found to have elevated troponin and started on heparin gtt (2650 unit bolus and gtt at 1050 units/hr started ~0200 1/31). Confirmed that heparin is now running at 850 units/hr - unsure when the rate was changed - no info about rate change or anti-Xa level in Care Everywhere - tried to call [facility] but pharmacy closed.  Goal of Therapy:  Heparin level 0.3-0.7 units/ml Monitor platelets by anticoagulation protocol: Yes   Plan:  Continue heparin 850 units/hr Will get stat heparin level   Sherlon Handing, PharmD, BCPS Please see amion for complete clinical pharmacist phone list 04/29/2022,7:58 PM  ADDENDUM (2210): Heparin level therapeutic (0.32) on infusion at 850 units/hr. Will increase slightly to 900 units/hr and f/u 8 hr heparin level to confirm therapeutic.  Sherlon Handing, PharmD, BCPS Please see amion for complete clinical pharmacist phone list 04/29/2022 10:11 PM

## 2022-04-29 NOTE — H&P (Signed)
History and Physical    Patient: Annette Sharp CNO:[id number] DOB: [birth year] DOA: 04/29/2022 DOS: the patient was seen and examined on 04/29/2022 PCP: Manon Hilding, MD  Patient coming from: Outside Hospital  Chief Complaint: No chief complaint on file.  HPI: Annette Sharp is a 82 y.o. female with medical history significant of CAD s/p MI with PCI/stent placement via Dr. Harl Bowie of [facility] cards approximately 8 years ago, who presented to [facility] ED for evaluation of 1 day of confusion.  Pt with more recent admission 01/21/2022 for NSTEMI to [facility], Trops peaked at 234, pt completed 48h IV heparin in hospital and pt then discharged home.  Per patient's husband: patient has had progressive confusion over the course of the last day.  He notes that at baseline, the patient is alert and oriented x 4.  However, in the setting of reported progressive confusion over the course of the last day, EDP noted that the patient to be confused and not oriented to time, with the patient thinking that the current year is either 2012 or 2020, although she did know that the current month is January.  No report of any recent trauma/fall per EDP (did fall a few days ago per husband but no acute head bleed on CT head in ED).  EDP didn't report any associated acute focal neurologic deficits; however notes in Epic care everywhere report pt "with mild twitching noted to her right eye. Pt appeared to be staring off and does not respond when her name is called. This episode lasted for about 2-3 minutes and then patient began to yell and cry."  Also on my review of Epic: pt appears to have had an EEG performed just 5 days ago on 04/24/2022 which appears to be normal.  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  On my discussion with husband: he reports that pt has been  having mild symptoms similar to above for some time now, episodes of staring spells followed by crying out and confusion; however, this acutely became worse last evening.  She has been being worked up by neurology in [place].  She is believed to have had several small strokes on MRI (that we can't see, prior to MRA here).  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  The patient reportedly conveyed that she had experienced some chest pain earlier in the day, but denied any acute symptoms present in the ED, including no current chest pain or shortness of breath.  Vital signs in the ED were reported to be stable, including normotensive blood pressures, afebrile, and oxygen saturations in the mid to high 90s on room air.  Labs were notable for urinalysis that was inconsistent with UTI.  CMP reflected creatinine 1.2.  Initial high-sensitivity troponin was 88, followed by 204, followed by 579.  Serial EKGs reportedly showed sinus rhythm without overt evidence of acute ischemic changes, including no evidence of ST elevation.  Chest x-ray reportedly showed no evidence of acute process.  Medications administered prior to transfer included the following: Aspirin 162 mg p.o. as well as Plavix 75 mg p.o. x 1 dose.  Additionally, heparin drip was initiated in the absence of preceding bolus.  Pt transferred for Cardiology evaluation for suspected NSTEMI.   Review of Systems: As mentioned in the history of present illness. All  other systems reviewed and are negative. Past Medical History:  Diagnosis Date   Anxiety    Back pain    Cerebrovascular disease    NONOBSTRUCTIVE CVA BY CAROTID DOPPLERS OCTOBER 2008   Chronic renal insufficiency    GFR 45 ML'S PER MINUTE   COPD (chronic obstructive pulmonary disease) (HCC)    Coronary artery disease    a. s/p DES to RCA in 2007 b. patent stent by cath in 12/2014 with nonobstructive disease along LAD and RCA   Coronary atherosclerosis of  native coronary artery    NEGATIVE LEXISCAN FOR ISCHEMIA MARCH 23,2010 NORMAL LV FUNCTTION   Depression    Dyspnea    with exertion   GERD (gastroesophageal reflux disease)    Headache    chronic headache from fall in December   Hyperlipidemia    Hypertension    Hypothyroidism    Other and unspecified angina pectoris    ANGINA   Overweight(278.02)    OBESITY   Peptic ulcer disease    Primary localized osteoarthritis of left knee    Sleep apnea    SEVERE OBSTRUCTIVE    cpap   Stroke ([place])    affected memory   Thyroid disease    TREATED HYPOTHYROIDISM   Past Surgical History:  Procedure Laterality Date   ABDOMINAL HYSTERECTOMY     BIOPSY  06/18/2021   Procedure: BIOPSY;  Surgeon: Rogene Houston, MD;  [facility];  Service: Endoscopy;;   CARDIAC CATHETERIZATION N/A 01/22/2015   Procedure: Right/Left Heart Cath and Coronary Angiography;  Surgeon: Wellington Hampshire, MD;  Location: [place] [facility];  Service: Cardiovascular;  Laterality: N/A;   CARPAL TUNNEL RELEASE     CHOLECYSTECTOMY     COLONOSCOPY N/A 03/31/2018   Procedure: COLONOSCOPY;  Surgeon: Rogene Houston, MD;  [facility];  Service: Endoscopy;  Laterality: N/A;  2:45   COLONOSCOPY WITH PROPOFOL N/A 06/18/2021   Procedure: COLONOSCOPY WITH PROPOFOL;  Surgeon: Rogene Houston, MD;  [facility];  Service: Endoscopy;  Laterality: N/A;  11:00   JOINT REPLACEMENT     LEFT HEART CATH AND CORONARY ANGIOGRAPHY N/A 04/28/2018   Procedure: LEFT HEART CATH AND CORONARY ANGIOGRAPHY;  Surgeon: Belva Crome, MD;  Location: [place] [facility];  Service: Cardiovascular;  Laterality: N/A;   POLYPECTOMY  03/31/2018   Procedure: POLYPECTOMY;  Surgeon: Rogene Houston, MD;  [facility];  Service: Endoscopy;;  splen. flex, cecum   POLYPECTOMY  06/18/2021   Procedure: POLYPECTOMY;  Surgeon: Rogene Houston, MD;  [facility];  Service: Endoscopy;;   REPLACEMENT TOTAL KNEE Right  07/28/2010   ROTATOR CUFF REPAIR Right 07/08/2009   TOTAL ABDOMINAL HYSTERECTOMY W/ BILATERAL SALPINGOOPHORECTOMY     TOTAL KNEE ARTHROPLASTY Left 04/08/2015   Procedure: LEFT TOTAL KNEE ARTHROPLASTY;  Surgeon: Elsie Saas, MD;  Location: [place];  Service: Orthopedics;  Laterality: Left;   TOTAL SHOULDER ARTHROPLASTY Left 06/02/2017   Procedure: TOTAL SHOULDER ARTHROPLASTY;  Surgeon: Hiram Gash, MD;  Location: [place];  Service: Orthopedics;  Laterality: Left;   Social History:  reports that she has never smoked. She has never used smokeless tobacco. She reports that she does not drink alcohol and does not use drugs.  Allergies  Allergen Reactions   Ace Inhibitors Cough   Gemfibrozil Other (See Comments)    Muscle Aches   Moxifloxacin Rash   Ranexa [Ranolazine] Other (See Comments)    "NIGHTMARES"  Family History  Problem Relation Age of Onset   CAD Other    Hypertension Other    Heart attack Other    Cancer Other    CAD Mother    Cancer Mother    CAD Father    Diabetes Sister    CAD Sister    Leukemia Brother     Prior to Admission medications   Medication Sig Start Date End Date Taking? Authorizing Provider  acyclovir (ZOVIRAX) 400 MG tablet Take 400 mg by mouth in the morning.    [provider]  albuterol (VENTOLIN HFA) 108 (90 Base) MCG/ACT inhaler Inhale 2 puffs into the lungs every 6 (six) hours as needed for wheezing or shortness of breath. 03/12/22   Arnoldo Lenis, MD  amLODipine (NORVASC) 10 MG tablet Take 10 mg by mouth every morning. 05/04/15   [provider]  aspirin EC 81 MG tablet Take 81 mg by mouth in the morning. 04/03/18   Rogene Houston, MD  buPROPion (WELLBUTRIN SR) 150 MG 12 hr tablet Take 150 mg by mouth 2 (two) times daily. 01/20/22   [provider]  citalopram (CELEXA) 20 MG tablet Take 20 mg by mouth every morning.    [provider]  clopidogrel (PLAVIX) 75 MG tablet Take 1 tablet (75 mg total) by mouth every  morning. 06/19/21   Rehman, Mechele Dawley, MD  donepezil (ARICEPT) 5 MG tablet Take 5 mg by mouth every morning. 01/15/22   [provider]  hydrALAZINE (APRESOLINE) 25 MG tablet Take 1 tablet (25 mg total) by mouth 2 (two) times daily. 12/23/21   Arnoldo Lenis, MD  hyoscyamine (LEVBID) 0.375 MG 12 hr tablet TAKE 1 TABLET (0.375 MG TOTAL) BY MOUTH DAILY. 07/21/21   Rogene Houston, MD  isosorbide mononitrate (IMDUR) 120 MG 24 hr tablet Take 1 tablet (120 mg total) by mouth in the morning and at bedtime. 03/12/22   Arnoldo Lenis, MD  levothyroxine (SYNTHROID) 75 MCG tablet Take 75 mcg by mouth daily. 12/03/20   [provider]  nitroGLYCERIN (NITROSTAT) 0.4 MG SL tablet Place 1 tablet (0.4 mg total) under the tongue every 5 (five) minutes x 3 doses as needed for chest pain (if no relief after 3rd dose, proceed to the ED for an evaluation). 02/08/18 06/14/23  Herminio Commons, MD  rosuvastatin (CRESTOR) 20 MG tablet TAKE 1 TABLET BY MOUTH EVERY DAY Patient taking differently: Take 20 mg by mouth every evening. 04/28/21   Arnoldo Lenis, MD  Thiamine HCl (VITAMIN B-1 PO) Take 1 tablet by mouth daily.    [provider]    Physical Exam: Vitals:   04/29/22 1842  BP: (!) 160/72  Resp: 19  Temp: 98.3 F (36.8 C)  TempSrc: Oral  SpO2: 98%  Weight: 76.5 kg  Height: '5\' 2"'$  (1.575 m)   Constitutional: NAD, calm, comfortable Neck: no JVD Respiratory: clear to auscultation bilaterally, no wheezing, no crackles. Normal respiratory effort. No accessory muscle use.  Cardiovascular: Regular rate and rhythm, no murmurs / rubs / gallops. No extremity edema. 2+ pedal pulses. No carotid bruits.  Neurologic: grossly non-focal, hard of hearing Psychiatric: Normal mood, Mild confusion (no memory of events from earlier today).  Data Reviewed:  There are no new results to review at this time.  Assessment and Plan: * NSTEMI (non-ST elevated myocardial infarction)  [facility]) Cards consulted ACS pathway Cont DAPT Heparin gtt Tele monitor Serial trops Echo tomorrow  Acute encephalopathy Possibly delirium secondary  to NSTEMI superimposed on recently diagnosed vascular dementia. DDx includes undiagnosed seizure disorder.  Though EEG was unremarkable a mere 5 days ago. Also possible is a more recent stroke causing demand ischemia / trop elevation and confusion. Getting MRI brain to r/o acute / subacute stroke Involve neurology here if positive Check TSH UA reportedly negative in ED Check CBC/CMP  Hypothyroidism Continue synthroid. Check TSH  Essential hypertension Continue home BP meds.  Hyperlipidemia Continue statin      Advance Care Planning:   Code Status: Full Code Default based on prior status.  Consults: Cardiology  Family Communication: Husband at bedside  Severity of Illness: The appropriate patient status for this patient is INPATIENT. Inpatient status is judged to be reasonable and necessary in order to provide the required intensity of service to ensure the patient's safety. The patient's presenting symptoms, physical exam findings, and initial radiographic and laboratory data in the context of their chronic comorbidities is felt to place them at high risk for further clinical deterioration. Furthermore, it is not anticipated that the patient will be medically stable for discharge from the hospital within 2 midnights of admission.   * I certify that at the point of admission it is my clinical judgment that the patient will require inpatient hospital care spanning beyond 2 midnights from the point of admission due to high intensity of service, high risk for further deterioration and high frequency of surveillance required.*  Author: Etta Quill., DO 04/29/2022 7:29 PM  For on call review [email or web address].

## 2022-04-29 NOTE — Assessment & Plan Note (Signed)
Continue statin. 

## 2022-04-29 NOTE — Assessment & Plan Note (Addendum)
Cards consulted ACS pathway Cont DAPT Heparin gtt Tele monitor Serial trops Echo tomorrow

## 2022-04-29 NOTE — Assessment & Plan Note (Signed)
Continue home BP meds.

## 2022-04-29 NOTE — Consult Note (Addendum)
Cardiology Consultation   Patient ID: BRYNNLEY DAYRIT MRN: [id number]; DOB: Aug 26, 1940  Admit date: 04/29/2022 Date of Consult: 04/29/2022  PCP:  Manon Hilding, MD   [facility] Cardiologist:  Carlyle Dolly, MD   Patient Profile:   CINTHIA RODDEN is a 82 y.o. female with a hx of CAD s/p DES-RCA 2007, hypertension, hyperlipidemia, untreated OSA, prior CVA, and severe dementia who is being seen 04/29/2022 for the evaluation of elevated troponin at the request of Dr. Velia Meyer.  History of Present Illness:   Ms. Chambers has a prior cardiac history including coronary artery disease, hypertension, hyperlipidemia, prior CVA and sleep apnea unable to tolerate CPAP.  Unfortunately she has severe dementia.  She has a prior DES-RCA in 2007.  Heart catheterization January 2020 without significant disease.  She has a history of chest pain that has been managed medically.  She had a nuclear stress test 01/2020 that did not show ischemia but did show short runs of atrial tachycardia.  Echocardiogram 11/2020 showed an LVEF 60-70%, grade 2 DD, normal RV, mild MR, mild AI.  She was in the ER at [facility] with chest pain on 12/2020 that was described as right-sided and pleuritic.  She ruled out with negative troponins and nonischemic EKG.  She was admitted 12/2021 with chest pain and troponin elevation to 234.  EKG was without ischemic changes and echocardiogram showed preserved EF with no wall motion abnormality.  Given her advanced age and severe dementia along with the above, decision was made to manage her medically.  She continues to have chest pain 1-2 times per week.  Hypertension is managed with hydralazine 25 mg twice daily.  She is unable to tolerate CPAP.  Hyperlipidemia is managed with Crestor 20 mg daily.  She has a history of prior CVA and is on Plavix and ASA.  She was last seen in clinic by Dr. Harl Bowie on 03/12/2022.   [facility] due to altered  mental status.  She was more altered than normal.  She does have severe dementia complicating history. CT head negative for acute findings, remote cerebellar infarcts,   No leukocytosis. UA negative for UTI.  HS troponin elevated 116 --> 579  In the ER she had fluctuating confusion and twitching of her right eye.  She had an episode of staring off and was unresponsive for about 2 to 3 minutes before yelling and crying.  Due to history of CAD and elevated troponin, heparin drip was started and decision was made to transfer to [facility] for further care.   Past Medical History:  Diagnosis Date   Anxiety    Back pain    Cerebrovascular disease    NONOBSTRUCTIVE CVA BY CAROTID DOPPLERS OCTOBER 2008   Chronic renal insufficiency    GFR 45 ML'S PER MINUTE   COPD (chronic obstructive pulmonary disease) (HCC)    Coronary artery disease    a. s/p DES to RCA in 2007 b. patent stent by cath in 12/2014 with nonobstructive disease along LAD and RCA   Coronary atherosclerosis of native coronary artery    NEGATIVE LEXISCAN FOR ISCHEMIA MARCH 23,2010 NORMAL LV FUNCTTION   Depression    Dyspnea    with exertion   GERD (gastroesophageal reflux disease)    Headache    chronic headache from fall in December   Hyperlipidemia    Hypertension    Hypothyroidism    Other and unspecified angina pectoris  ANGINA   Overweight(278.02)    OBESITY   Peptic ulcer disease    Primary localized osteoarthritis of left knee    Sleep apnea    SEVERE OBSTRUCTIVE    cpap   Stroke [facility])    affected memory   Thyroid disease    TREATED HYPOTHYROIDISM    Past Surgical History:  Procedure Laterality Date   ABDOMINAL HYSTERECTOMY     BIOPSY  06/18/2021   Procedure: BIOPSY;  Surgeon: Rogene Houston, MD;  [facility];  Service: Endoscopy;;   CARDIAC CATHETERIZATION N/A 01/22/2015   Procedure: Right/Left Heart Cath and Coronary Angiography;  Surgeon: Wellington Hampshire, MD;  Location: [place] CV LAB;  Service: Cardiovascular;  Laterality: N/A;   CARPAL TUNNEL RELEASE     CHOLECYSTECTOMY     COLONOSCOPY N/A 03/31/2018   Procedure: COLONOSCOPY;  Surgeon: Rogene Houston, MD;  [facility];  Service: Endoscopy;  Laterality: N/A;  2:45   COLONOSCOPY WITH PROPOFOL N/A 06/18/2021   Procedure: COLONOSCOPY WITH PROPOFOL;  Surgeon: Rogene Houston, MD;  [facility];  Service: Endoscopy;  Laterality: N/A;  11:00   JOINT REPLACEMENT     LEFT HEART CATH AND CORONARY ANGIOGRAPHY N/A 04/28/2018   Procedure: LEFT HEART CATH AND CORONARY ANGIOGRAPHY;  Surgeon: Belva Crome, MD;  Location: [place] CV LAB;  Service: Cardiovascular;  Laterality: N/A;   POLYPECTOMY  03/31/2018   Procedure: POLYPECTOMY;  Surgeon: Rogene Houston, MD;  [facility];  Service: Endoscopy;;  splen. flex, cecum   POLYPECTOMY  06/18/2021   Procedure: POLYPECTOMY;  Surgeon: Rogene Houston, MD;  [facility];  Service: Endoscopy;;   REPLACEMENT TOTAL KNEE Right 07/28/2010   ROTATOR CUFF REPAIR Right 07/08/2009   TOTAL ABDOMINAL HYSTERECTOMY W/ BILATERAL SALPINGOOPHORECTOMY     TOTAL KNEE ARTHROPLASTY Left 04/08/2015   Procedure: LEFT TOTAL KNEE ARTHROPLASTY;  Surgeon: Elsie Saas, MD;  Location: [place];  Service: Orthopedics;  Laterality: Left;   TOTAL SHOULDER ARTHROPLASTY Left 06/02/2017   Procedure: TOTAL SHOULDER ARTHROPLASTY;  Surgeon: Hiram Gash, MD;  Location: [place];  Service: Orthopedics;  Laterality: Left;     Home Medications:  Prior to Admission medications   Medication Sig Start Date End Date Taking? Authorizing Provider  acyclovir (ZOVIRAX) 400 MG tablet Take 400 mg by mouth in the morning.    [provider]  albuterol (VENTOLIN HFA) 108 (90 Base) MCG/ACT inhaler Inhale 2 puffs into the lungs every 6 (six) hours as needed for wheezing or shortness of breath. 03/12/22   Arnoldo Lenis, MD  amLODipine (NORVASC) 10 MG tablet Take 10 mg by  mouth every morning. 05/04/15   [provider]  aspirin EC 81 MG tablet Take 81 mg by mouth in the morning. 04/03/18   Rogene Houston, MD  buPROPion (WELLBUTRIN SR) 150 MG 12 hr tablet Take 150 mg by mouth 2 (two) times daily. 01/20/22   [provider]  citalopram (CELEXA) 20 MG tablet Take 20 mg by mouth every morning.    [provider]  clopidogrel (PLAVIX) 75 MG tablet Take 1 tablet (75 mg total) by mouth every morning. 06/19/21   Rehman, Mechele Dawley, MD  donepezil (ARICEPT) 5 MG tablet Take 5 mg by mouth every morning. 01/15/22   [provider]  hydrALAZINE (APRESOLINE) 25 MG tablet Take 1 tablet (25 mg total) by mouth 2 (two) times daily. 12/23/21   Arnoldo Lenis,  MD  hyoscyamine (LEVBID) 0.375 MG 12 hr tablet TAKE 1 TABLET (0.375 MG TOTAL) BY MOUTH DAILY. 07/21/21   Rogene Houston, MD  isosorbide mononitrate (IMDUR) 120 MG 24 hr tablet Take 1 tablet (120 mg total) by mouth in the morning and at bedtime. 03/12/22   Arnoldo Lenis, MD  levothyroxine (SYNTHROID) 75 MCG tablet Take 75 mcg by mouth daily. 12/03/20   [provider]  nitroGLYCERIN (NITROSTAT) 0.4 MG SL tablet Place 1 tablet (0.4 mg total) under the tongue every 5 (five) minutes x 3 doses as needed for chest pain (if no relief after 3rd dose, proceed to the ED for an evaluation). 02/08/18 06/14/23  Herminio Commons, MD  rosuvastatin (CRESTOR) 20 MG tablet TAKE 1 TABLET BY MOUTH EVERY DAY Patient taking differently: Take 20 mg by mouth every evening. 04/28/21   Arnoldo Lenis, MD  Thiamine HCl (VITAMIN B-1 PO) Take 1 tablet by mouth daily.    [provider]    Inpatient Medications: Scheduled Meds:  Continuous Infusions:  PRN Meds:   Allergies:    Allergies  Allergen Reactions   Ace Inhibitors Cough   Gemfibrozil Other (See Comments)    Muscle Aches   Moxifloxacin Rash   Ranexa [Ranolazine] Other (See Comments)    "NIGHTMARES"    Social History:    Social History   Socioeconomic History   Marital status: Married    Spouse name: Not on file   Number of children: Not on file   Years of education: Not on file   Highest education level: Not on file  Occupational History   Not on file  Tobacco Use   Smoking status: Never   Smokeless tobacco: Never  Vaping Use   Vaping Use: Never used  Substance and Sexual Activity   Alcohol use: No    Alcohol/week: 0.0 standard drinks of alcohol   Drug use: No   Sexual activity: Never  Other Topics Concern   Not on file  Social History Narrative   Not on file   Social Determinants of Health   Financial Resource Strain: Not on file  Food Insecurity: No Food Insecurity (01/21/2022)   Hunger Vital Sign    Worried About Running Out of Food in the Last Year: Never true    Ran Out of Food in the Last Year: Never true  Transportation Needs: No Transportation Needs (01/21/2022)   PRAPARE - Hydrologist (Medical): No    Lack of Transportation (Non-Medical): No  Physical Activity: Not on file  Stress: Not on file  Social Connections: Not on file  Intimate Partner Violence: Not At Risk (01/21/2022)   Humiliation, Afraid, Rape, and Kick questionnaire    Fear of Current or Ex-Partner: No    Emotionally Abused: No    Physically Abused: No    Sexually Abused: No    Family History:    Family History  Problem Relation Age of Onset   CAD Other    Hypertension Other    Heart attack Other    Cancer Other    CAD Mother    Cancer Mother    CAD Father    Diabetes Sister    CAD Sister    Leukemia Brother      ROS:  Please see the history of present illness.   All other ROS reviewed and negative.     Physical Exam/Data:   Vitals:   04/29/22 1842  BP: (!) 160/72  Resp:  19  Temp: 98.3 F (36.8 C)  TempSrc: Oral  SpO2: 98%  Weight: 76.5 kg  Height: '5\' 2"'$  (1.575 m)   No intake or output data in the 24 hours ending 04/29/22 1945    04/29/2022    6:42  PM 03/12/2022    1:48 PM 02/03/2022    8:37 AM  Last 3 Weights  Weight (lbs) 168 lb 11.2 oz 146 lb 147 lb  Weight (kg) 76.522 kg 66.225 kg 66.679 kg     Body mass index is 30.86 kg/m.  General:  confused, but NAD HEENT: normal Neck: no JVD Vascular: No carotid bruits; Distal pulses 2+ bilaterally Cardiac:  normal S1, S2; RRR; 1/6 murmur  Lungs:  clear to auscultation bilaterally, no wheezing, rhonchi or rales  Abd: soft, nontender, no hepatomegaly  Ext: no edema Musculoskeletal:  No deformities, BUE and BLE strength normal and equal Skin: warm and dry  Neuro: grossly normal, HOH Psych:  Normal affect   EKG:  The EKG was personally reviewed and demonstrates:  pending Telemetry:  Telemetry was personally reviewed and demonstrates:  sinus bradycardia in the 50s with PACs  Relevant CV Studies:  Cardiac catheterization 04/28/2018:   Moderate three-vessel coronary disease, with diffuse proximal to mid calcification in all territories. Left main 25% diffuse narrowing Proximal to mid LAD 40 to 50%.  Unchanged from prior angiogram Proximal 30% circumflex.  First OM is small with irregularities. RCA contains a proximal to ostial stent that has 30% proximal narrowing.  Luminal irregularities are noted throughout the mid right coronary.  No significant obstruction is seen. Normal left ventricular function with EF greater than 50%.  LVEDP is normal.     01/2020 nuclear stress There was no ST segment deviation noted during stress. Short runs of atrial tachycardia with just mildly elevated rates around 110 The study is normal. There are no perfusion defects This is a low risk study. The left ventricular ejection fraction is hyperdynamic (>65%). Nonspecific finding of elevated TID in absence of perfusion defects   11/2020 echo IMPRESSIONS     1. Left ventricular ejection fraction, by estimation, is 65 to 70%. The  left ventricle has normal function. The left ventricle has no regional   wall motion abnormalities. There is mild left ventricular hypertrophy.  Left ventricular diastolic parameters  are consistent with Grade II diastolic dysfunction (pseudonormalization).  Elevated left ventricular end-diastolic pressure.   2. Right ventricular systolic function is normal. The right ventricular  size is normal. There is normal pulmonary artery systolic pressure. The  estimated right ventricular systolic pressure is 56.2 mmHg.   3. Left atrial size was mildly dilated.   4. There is a trivial pericardial effusion posterior to the left  ventricle.   5. The mitral valve is abnormal. Mild mitral valve regurgitation.   6. The aortic valve is tricuspid. There is mild calcification of the  aortic valve. Aortic valve regurgitation is mild. Mild to moderate aortic  valve sclerosis/calcification is present, without any evidence of aortic  stenosis. Aortic regurgitation PHT  measures 465 msec.   7. The inferior vena cava is normal in size with greater than 50%  respiratory variability, suggesting right atrial pressure of 3 mmHg.   Laboratory Data:  High Sensitivity Troponin:  No results for input(s): "TROPONINIHS" in the last 720 hours.   ChemistryNo results for input(s): "NA", "K", "CL", "CO2", "GLUCOSE", "BUN", "CREATININE", "CALCIUM", "MG", "GFRNONAA", "GFRAA", "ANIONGAP" in the last 168 hours.  No results for input(s): "PROT", "ALBUMIN", "AST", "  ALT", "ALKPHOS", "BILITOT" in the last 168 hours. Lipids No results for input(s): "CHOL", "TRIG", "HDL", "LABVLDL", "LDLCALC", "CHOLHDL" in the last 168 hours.  HematologyNo results for input(s): "WBC", "RBC", "HGB", "HCT", "MCV", "MCH", "MCHC", "RDW", "PLT" in the last 168 hours. Thyroid No results for input(s): "TSH", "FREET4" in the last 168 hours.  BNPNo results for input(s): "BNP", "PROBNP" in the last 168 hours.  DDimer No results for input(s): "DDIMER" in the last 168 hours.   Radiology/Studies:  No results  found.   Assessment and Plan:   Elevated troponin  HS troponin elevated to 579 EKG not yet performed Pt unable to provide history, but denies chest pain Heparin gtt running I think its reasonable to treat with heparin x 48 hrs, but likely not a candidate for invasive measures. If more cooperative, can consider repeat echo to help guide GDMT.   CAD s/p DES-RCA Last heart cath 2020 with no obstructive disease Appears she is on ASA and plavix following stroke Can continue if no signs of active bleeding   Hypertension Per notes, was on hydralazine 25 mg BID, 120 mg imdur Hold for now and follow BP   Hyperlipidemia  Continue 20 mg crestor   Hx of CVA Dementia AMS - UA negative, no leukocytosis  - per primary   Risk Assessment/Risk Scores:       For questions or updates, please contact [place] Please consult [email or web address] for contact info under    Signed, Ledora Bottcher, [place]  04/29/2022 7:45 PM   Patient seen and examined with Doreene Adas PA.  Agree as above, with the following exceptions and changes as noted below. Mrs. Stooksbury is seen with her husband of 75 years at the bedside. She is alert and oriented currently but has no memory of the events of earlier today.  Her husband notes that she bent over quickly and made a grunting sound and he thought she was vomiting.  When he asked her what she was doing she said that her pants and shoes were disconnected and she had to put them together.  He was concerned that the speech made no sense and called EMS.  She had fallen 2 days ago and head CT was performed at [facility] which was negative for acute bleed.  Troponin was checked given patient's chronic angina and was elevated with a delta troponin.  She has medically managed CAD with most recent episode in the fall 2023, please reference Dr. Nelly Laurence notes from December.  Gen: NAD, CV: RRR, 1-2/6 mid peaking upper sternal border systolic murmur lungs: clear, Abd:  soft, Extrem: Warm, well perfused, no edema, Neuro/Psych: alert and oriented x 3, normal mood and affect. All available labs, radiology testing, previous records reviewed.  Patient has a rising troponin without acute chest pain.  She does have chronic chest pain for which she takes nitroglycerin but has not needed it in the last several days per her husband.  She did fall a few days ago but no acute bleeding on head CT.  Previous cardiology decisions were to medically manage her CAD and she has had an elevated troponin in the past with a normal echocardiogram.  I discussed and shared decision making with the patient and her husband that we should proceed with echocardiogram tomorrow, if regional wall motion abnormalities are present we can discuss further management of the elevated troponin.  I would still favor a medical management strategy.  Likely will need heparin for 48 hours.  Sublingual nitroglycerin  for chest pain.  EKG is not available at the time of this consultation, we have ordered this, it is still not available for review at the time of this dictation.  Elouise Munroe, MD 04/29/22 8:26 PM

## 2022-04-29 NOTE — Assessment & Plan Note (Addendum)
Continue synthroid. Check TSH

## 2022-04-29 NOTE — Assessment & Plan Note (Addendum)
Possibly delirium secondary to NSTEMI superimposed on recently diagnosed vascular dementia. DDx includes undiagnosed seizure disorder.  Though EEG was unremarkable a mere 5 days ago. Also possible is a more recent stroke causing demand ischemia / trop elevation and confusion. Getting MRI brain to r/o acute / subacute stroke Involve neurology here if positive Check TSH UA reportedly negative in ED Check CBC/CMP

## 2022-04-30 ENCOUNTER — Inpatient Hospital Stay (HOSPITAL_COMMUNITY): Payer: Medicare Other

## 2022-04-30 DIAGNOSIS — I214 Non-ST elevation (NSTEMI) myocardial infarction: Secondary | ICD-10-CM | POA: Diagnosis not present

## 2022-04-30 DIAGNOSIS — R079 Chest pain, unspecified: Secondary | ICD-10-CM | POA: Diagnosis not present

## 2022-04-30 LAB — CBC
HCT: 35.3 % — ABNORMAL LOW (ref 36.0–46.0)
Hemoglobin: 12.4 g/dL (ref 12.0–15.0)
MCH: 32.3 pg (ref 26.0–34.0)
MCHC: 35.1 g/dL (ref 30.0–36.0)
MCV: 91.9 fL (ref 80.0–100.0)
Platelets: 158 10*3/uL (ref 150–400)
RBC: 3.84 MIL/uL — ABNORMAL LOW (ref 3.87–5.11)
RDW: 13.1 % (ref 11.5–15.5)
WBC: 5.4 10*3/uL (ref 4.0–10.5)
nRBC: 0 % (ref 0.0–0.2)

## 2022-04-30 LAB — HEPARIN LEVEL (UNFRACTIONATED)
Heparin Unfractionated: 0.6 IU/mL (ref 0.30–0.70)
Heparin Unfractionated: 0.54 IU/mL (ref 0.30–0.70)

## 2022-04-30 LAB — ECHOCARDIOGRAM COMPLETE
AR max vel: 1.69 cm2
AV Area VTI: 1.84 cm2
AV Area mean vel: 1.68 cm2
AV Mean grad: 9 mmHg
AV Peak grad: 15.5 mmHg
Ao pk vel: 1.97 m/s
Area-P 1/2: 2.55 cm2
Height: 62 in
P 1/2 time: 923 msec
S' Lateral: 2 cm
Weight: 2699.2 oz

## 2022-04-30 MED ORDER — HYDRALAZINE HCL 50 MG PO TABS
50.0000 mg | ORAL_TABLET | Freq: Two times a day (BID) | ORAL | Status: DC
  Administered 2022-04-30 – 2022-05-01 (×2): 50 mg via ORAL
  Filled 2022-04-30 (×2): qty 1

## 2022-04-30 NOTE — Progress Notes (Signed)
PROGRESS NOTE    Annette Sharp  WHQ:[id number] DOB: 01/15/1941 DOA: 04/29/2022 PCP: Manon Hilding, MD    Brief Narrative:  82 year old with history of coronary artery disease status post PCI and stent 8 years ago, recent admission 2 months ago with non-STEMI treated with 48 hours of heparin in the hospital and discharged home.  Presented with 1 day of worsening confusion and found to have elevated troponins so transferred from outside hospital to [facility] with cardiology consultation.  Does have history of hypertension, coronary [name], [name], prior stroke and sleep apnea unable to tolerate CPAP.  Does have severe dementia.  Recent multiple neurological workups.  Troponins at [place].   Assessment & Plan:   Non-STEMI: Unreliable historian, advanced dementia.  Currently chest pain-free. Continue heparin for 48 hours. Aspirin, Plavix, nitrates, hydralazine and Imdur.  Already on Crestor.  Followed by cardiology.  Decided conservative treatment because patient was very poor candidate for invasive procedures.  Acute metabolic encephalopathy: Probably due to above.  Recent extensive investigations including EEG and MRI. MRI on admission 1/31 shows no acute neurochanges.  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 Fall precautions.  Delirium precautions.  TSH is 3.8. Her mentation has improved as per family.  Hypothyroidism: On Synthroid.  TSH 3.8.  Essential hypertension: Resume home medications including amlodipine.  Resume Imdur and hydralazine.  Dementia: High risk of delirium.  Currently on Celexa, Aricept.  Fall precautions.  Delirium precautions.   DVT prophylaxis: Heparin infusion   Code Status: Full code Family Communication: Husband at the bedside Disposition Plan: Status is: Inpatient Remains inpatient appropriate because: Remains on heparin infusion     Consultants:  Cardiology  Procedures:   None  Antimicrobials:  None   Subjective: Patient seen and examined in the morning rounds.  Her husband was at the bedside.  Patient is pleasant and forgetful.  Husband tells me that she is at about her baseline regarding her mental status. Will consult PT OT to mobilize.  Objective: Vitals:   04/29/22 1945 04/29/22 2332 04/30/22 0508 04/30/22 0744  BP: (!) 154/54 (!) 145/59 (!) 142/92 (!) 166/58  Pulse: (!) 55 67 (!) 55 (!) 57  Resp: '18 18 18 19  '$ Temp: 98.3 F (36.8 C) 98.7 F (37.1 C) 98.7 F (37.1 C) 98 F (36.7 C)  TempSrc: Oral Oral Oral Oral  SpO2: 98% 97% 96% 97%  Weight:      Height:        Intake/Output Summary (Last 24 hours) at 04/30/2022 0757 Last data filed at 04/30/2022 0717 Gross per 24 hour  Intake 700.74 ml  Output --  Net 700.74 ml   Filed Weights   04/29/22 1842  Weight: 76.5 kg    Examination:  General exam: Appears calm and comfortable  Alert awake.  Oriented to herself and situation.  Not oriented to time. Respiratory system: Clear to auscultation. Respiratory effort normal. Cardiovascular system: S1 & S2 heard, RRR. No pedal edema. Gastrointestinal system: Abdomen is nondistended, soft and nontender. No organomegaly or masses felt. Normal bowel sounds heard. Central nervous system: Alert and awake.  Oriented to herself and family.  No focal logical deficits. Extremities: Symmetric 5 x 5 power.     Data Reviewed: I have personally reviewed following labs and imaging studies  CBC: Recent Labs  Lab 04/29/22 2005 04/30/22 0456  WBC 5.8 5.4  NEUTROABS 3.3  --   HGB 12.6 12.4  HCT 36.6 35.3*  MCV 92.4  91.9  PLT 167 073   Basic Metabolic Panel: Recent Labs  Lab 04/29/22 2005  NA 136  K 3.7  CL 104  CO2 24  GLUCOSE 98  BUN 10  CREATININE 1.16*  CALCIUM 8.7*   GFR: Estimated Creatinine Clearance: 37.7 mL/min (A) (by C-G formula based on SCr of 1.16 mg/dL (H)). Liver Function Tests: Recent Labs  Lab 04/29/22 2005  AST 51*   ALT 55*  ALKPHOS 48  BILITOT 0.5  PROT 6.3*  ALBUMIN 3.6   No results for input(s): "LIPASE", "AMYLASE" in the last 168 hours. No results for input(s): "AMMONIA" in the last 168 hours. Coagulation Profile: Recent Labs  Lab 04/29/22 2005  INR 1.1   Cardiac Enzymes: No results for input(s): "CKTOTAL", "CKMB", "CKMBINDEX", "TROPONINI" in the last 168 hours. BNP (last 3 results) No results for input(s): "PROBNP" in the last 8760 hours. HbA1C: No results for input(s): "HGBA1C" in the last 72 hours. CBG: No results for input(s): "GLUCAP" in the last 168 hours. Lipid Profile: No results for input(s): "CHOL", "HDL", "LDLCALC", "TRIG", "CHOLHDL", "LDLDIRECT" in the last 72 hours. Thyroid Function Tests: Recent Labs    04/29/22 2005  TSH 3.807   Anemia Panel: No results for input(s): "VITAMINB12", "FOLATE", "FERRITIN", "TIBC", "IRON", "RETICCTPCT" in the last 72 hours. Sepsis Labs: No results for input(s): "PROCALCITON", "LATICACIDVEN" in the last 168 hours.  No results found for this or any previous visit (from the past 240 hour(s)).       Radiology Studies: MR BRAIN WO CONTRAST  Result Date: 04/29/2022 CLINICAL DATA:  Altered mental status EXAM: MRI HEAD WITHOUT CONTRAST TECHNIQUE: Multiplanar, multiecho pulse sequences of the brain and surrounding structures were obtained without intravenous contrast. COMPARISON:  11/17/2021 FINDINGS: Brain: No acute infarct, mass effect or extra-axial collection. No chronic microhemorrhage or siderosis. There is multifocal hyperintense T2-weighted signal within the white matter. Parenchymal volume and CSF spaces are normal. 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 The midline structures are normal. Vascular: Normal flow voids. Skull and upper cervical spine: Normal marrow signal. Sinuses/Orbits: Right sphenoid sinus mucosal thickening. Bilateral ocular lens replacements. Other: None IMPRESSION: 1. No acute  intracranial abnormality. 2. 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 3. Findings of chronic small vessel ischemia. Electronically Signed   By: Ulyses Jarred M.D.   On: 04/29/2022 23:58        Scheduled Meds:  acyclovir  400 mg Oral q AM   amLODipine  10 mg Oral q morning   aspirin EC  81 mg Oral q AM   buPROPion  150 mg Oral BID   citalopram  20 mg Oral q morning   clopidogrel  75 mg Oral q morning   donepezil  5 mg Oral q morning   hydrALAZINE  25 mg Oral BID   isosorbide mononitrate  120 mg Oral Daily   levothyroxine  75 mcg Oral Daily   rosuvastatin  20 mg Oral Daily   thiamine  100 mg Oral Daily   Continuous Infusions:  heparin 900 Units/hr (04/30/22 0437)     LOS: 1 day    Time spent: 35 minutes    [name], MD [facility] Pager [phone]

## 2022-04-30 NOTE — Progress Notes (Signed)
ANTICOAGULATION CONSULT NOTE - Follow-Up Consult  Pharmacy Consult for Heparin infusion Indication: chest pain/ACS  Allergies  Allergen Reactions   Ace Inhibitors Cough   Gemfibrozil Other (See Comments)    Muscle Aches   Moxifloxacin Rash   Ranexa [Ranolazine] Other (See Comments)    "NIGHTMARES"    Patient Measurements: Height: '5\' 2"'$  (157.5 cm) Weight: 76.5 kg (168 lb 11.2 oz) IBW/kg (Calculated) : 50.1 Heparin Dosing Weight: 67 kg  Vital Signs: Temp: 98 F (36.7 C) (02/01 0744) Temp Source: Oral (02/01 0744) BP: 166/58 (02/01 0744) Pulse Rate: 57 (02/01 0744)  Labs: Recent Labs    04/29/22 2005 04/29/22 2025 04/29/22 2219 04/30/22 0456  HGB 12.6  --   --  12.4  HCT 36.6  --   --  35.3*  PLT 167  --   --  158  LABPROT 14.0  --   --   --   INR 1.1  --   --   --   HEPARINUNFRC  --  0.32  --  0.60  CREATININE 1.16*  --   --   --   TROPONINIHS 207*  --  212*  --     Estimated Creatinine Clearance: 37.7 mL/min (A) (by C-G formula based on SCr of 1.16 mg/dL (H)).   Medical History: Past Medical History:  Diagnosis Date   Anxiety    Back pain    Cerebrovascular disease    NONOBSTRUCTIVE CVA BY CAROTID DOPPLERS OCTOBER 2008   Chronic renal insufficiency    GFR 80 ML'S PER MINUTE   COPD (chronic obstructive pulmonary disease) (HCC)    Coronary artery disease    a. s/p DES to RCA in 2007 b. patent stent by cath in 12/2014 with nonobstructive disease along LAD and RCA   Coronary atherosclerosis of native coronary artery    NEGATIVE LEXISCAN FOR ISCHEMIA MARCH 23,2010 NORMAL LV FUNCTTION   Depression    Dyspnea    with exertion   GERD (gastroesophageal reflux disease)    Headache    chronic headache from fall in December   Hyperlipidemia    Hypertension    Hypothyroidism    Other and unspecified angina pectoris    ANGINA   Overweight(278.02)    OBESITY   Peptic ulcer disease    Primary localized osteoarthritis of left knee    Sleep apnea    SEVERE  OBSTRUCTIVE    cpap   Stroke ([place])    affected memory   Thyroid disease    TREATED HYPOTHYROIDISM    Medications:  Medications Prior to Admission  Medication Sig Dispense Refill Last Dose   acyclovir (ZOVIRAX) 400 MG tablet Take 400 mg by mouth in the morning.   04/28/2022   albuterol (VENTOLIN HFA) 108 (90 Base) MCG/ACT inhaler Inhale 2 puffs into the lungs every 6 (six) hours as needed for wheezing or shortness of breath. 1 each 2 Past Week   amLODipine (NORVASC) 10 MG tablet Take 10 mg by mouth every morning.  3 04/28/2022   aspirin EC 81 MG tablet Take 81 mg by mouth in the morning.   04/28/2022   buPROPion (WELLBUTRIN SR) 150 MG 12 hr tablet Take 150 mg by mouth 2 (two) times daily.   04/28/2022   citalopram (CELEXA) 20 MG tablet Take 20 mg by mouth every morning.   04/28/2022   clopidogrel (PLAVIX) 75 MG tablet Take 1 tablet (75 mg total) by mouth every morning.   04/28/2022   donepezil (ARICEPT) 5 MG  tablet Take 5 mg by mouth every morning.   04/28/2022   hydrALAZINE (APRESOLINE) 25 MG tablet Take 1 tablet (25 mg total) by mouth 2 (two) times daily. 180 tablet 1 04/28/2022   hyoscyamine (LEVBID) 0.375 MG 12 hr tablet TAKE 1 TABLET (0.375 MG TOTAL) BY MOUTH DAILY. 90 tablet 1 04/28/2022   isosorbide mononitrate (IMDUR) 120 MG 24 hr tablet Take 1 tablet (120 mg total) by mouth in the morning and at bedtime. 180 tablet 3 04/28/2022   levothyroxine (SYNTHROID) 75 MCG tablet Take 75 mcg by mouth daily.   04/28/2022   nitroGLYCERIN (NITROSTAT) 0.4 MG SL tablet Place 1 tablet (0.4 mg total) under the tongue every 5 (five) minutes x 3 doses as needed for chest pain (if no relief after 3rd dose, proceed to the ED for an evaluation). (Patient taking differently: Place 0.4 mg under the tongue every 5 (five) minutes x 3 doses as needed for chest pain.) 25 tablet 3 unknown   rosuvastatin (CRESTOR) 20 MG tablet TAKE 1 TABLET BY MOUTH EVERY DAY (Patient taking differently: Take 20 mg by mouth every evening.) 90  tablet 1 04/28/2022   Thiamine HCl (VITAMIN B-1 PO) Take 1 tablet by mouth daily.   04/28/2022    Assessment: 82 y.o. F presented to [facility] 1/30 with AMS. Pt found to have elevated troponin and started on heparin infusion. Pharmacy consulted to dose heparin as per ACS protocol.  Heparin level 0.6, therapeutic Current heparin infusion rate: 900 units/hr  Hgb 12.4, Plt 158 - stable No s/sx of bleeding, per RN report  Goal of Therapy:  Heparin level 0.3-0.7 units/ml Monitor platelets by anticoagulation protocol: Yes   Plan:  Continue heparin infusion at 900 units/hr Check heparin level in 8 hours Monitor daily CBC, heparin level, and for s/sx of bleeding Per cardiology note on 1/31, likely plan for 48h of IV heparin > f/u plan  Luisa Hart, PharmD, BCPS Clinical Pharmacist 04/30/2022 9:20 AM   Please refer to [facility] for pharmacy phone number

## 2022-04-30 NOTE — Evaluation (Signed)
Physical Therapy Evaluation Patient Details Name: Annette Sharp MRN: [id number] DOB: November 07, 1940 Today's Date: 04/30/2022  History of Present Illness  Pt is 82 year old presented to [facility] on  04/29/22 from [facility] with confusion and chest pain. Pt with NSTEMI. PMH - dementia, CAD, htn, OSA, CVA, bil TKR, bil shoulder replacement  Clinical Impression  Pt presents to PT with slightly unsteady gait due to illness and inactivity. Expect pt will make good progress back to baseline with mobility. Will follow acutely but doubt pt will need PT after DC.         Recommendations for follow up therapy are one component of a multi-disciplinary discharge planning process, led by the attending physician.  Recommendations may be updated based on patient status, additional functional criteria and insurance authorization.  Follow Up Recommendations No PT follow up      Assistance Recommended at Discharge Intermittent Supervision/Assistance  Patient can return home with the following  Help with stairs or ramp for entrance;Direct supervision/assist for medications management;Assist for transportation;Assistance with cooking/housework;Direct supervision/assist for financial management    Equipment Recommendations None recommended by PT  Recommendations for Other Services       Functional Status Assessment Patient has had a recent decline in their functional status and demonstrates the ability to make significant improvements in function in a reasonable and predictable amount of time.     Precautions / Restrictions Precautions Precautions: Fall      Mobility  Bed Mobility Overal bed mobility: Modified Independent                  Transfers Overall transfer level: Needs assistance Equipment used: Rolling walker (2 wheels) Transfers: Sit to/from Stand Sit to Stand: Supervision           General transfer comment: supervision for safety and lines     Ambulation/Gait Ambulation/Gait assistance: Min guard Gait Distance (Feet): 225 Feet Assistive device: Rolling walker (2 wheels) Gait Pattern/deviations: Step-through pattern, Decreased stride length Gait velocity: decr Gait velocity interpretation: 1.31 - 2.62 ft/sec, indicative of limited community ambulator   General Gait Details: Assist for safety. Pt with some unsteadiness but able to correct wtihout assist. Veering lt slightly which could be the walker  Stairs            Wheelchair Mobility    Modified Rankin (Stroke Patients Only)       Balance Overall balance assessment: Needs assistance, History of Falls Sitting-balance support: No upper extremity supported, Feet supported Sitting balance-Leahy Scale: Good     Standing balance support: No upper extremity supported, During functional activity Standing balance-Leahy Scale: Fair                               Pertinent Vitals/Pain Pain Assessment Pain Assessment: No/denies pain    Home Living Family/patient expects to be discharged to:: Private residence Living Arrangements: Spouse/significant other Available Help at Discharge: Family;Available 24 hours/day Type of Home: House Home Access: Stairs to enter;Ramped entrance   Entrance Stairs-Number of Steps: 2-3   Home Layout: One level Home Equipment: Conservation officer, nature (2 wheels);Cane - single point;BSC/3in1;Shower seat - built in      Prior Function Prior Level of Function : Independent/Modified Independent;History of Falls (last six months)             Mobility Comments: Doesn't use assistive device       Hand Dominance   Dominant Hand: Right  Extremity/Trunk Assessment   Upper Extremity Assessment Upper Extremity Assessment: Defer to OT evaluation    Lower Extremity Assessment Lower Extremity Assessment: Generalized weakness       Communication   Communication: No difficulties  Cognition Arousal/Alertness:  Awake/alert Behavior During Therapy: WFL for tasks assessed/performed Overall Cognitive Status: History of cognitive impairments - at baseline                                          General Comments General comments (skin integrity, edema, etc.): VSS on RA    Exercises     Assessment/Plan    PT Assessment Patient needs continued PT services  PT Problem List Decreased strength;Decreased balance;Decreased mobility       PT Treatment Interventions DME instruction;Gait training;Functional mobility training;Therapeutic activities;Therapeutic exercise;Balance training;Patient/family education    PT Goals (Current goals can be found in the Care Plan section)  Acute Rehab PT Goals Patient Stated Goal: go home PT Goal Formulation: With patient Time For Goal Achievement: 05/07/22 Potential to Achieve Goals: Good    Frequency Min 3X/week     Co-evaluation               AM-PAC PT "6 Clicks" Mobility  Outcome Measure Help needed turning from your back to your side while in a flat bed without using bedrails?: None Help needed moving from lying on your back to sitting on the side of a flat bed without using bedrails?: None Help needed moving to and from a bed to a chair (including a wheelchair)?: A Little Help needed standing up from a chair using your arms (e.g., wheelchair or bedside chair)?: A Little Help needed to walk in hospital room?: A Little Help needed climbing 3-5 steps with a railing? : A Little 6 Click Score: 20    End of Session Equipment Utilized During Treatment: Gait belt Activity Tolerance: Patient tolerated treatment well Patient left: in bed;with call bell/phone within reach;with bed alarm set;with family/visitor present Nurse Communication: Mobility status PT Visit Diagnosis: Unsteadiness on feet (R26.81);Muscle weakness (generalized) (M62.81)    Time: 1545-1600 PT Time Calculation (min) (ACUTE ONLY): 15 min   Charges:   PT  Evaluation $PT Eval Low Complexity: [place] [facility] [place] 04/30/2022, 4:22 PM

## 2022-04-30 NOTE — Progress Notes (Signed)
  Echocardiogram 2D Echocardiogram has been performed.  Wynelle Link 04/30/2022, 12:39 PM

## 2022-04-30 NOTE — Progress Notes (Signed)
ANTICOAGULATION CONSULT NOTE - Follow-Up Consult  Pharmacy Consult for Heparin infusion Indication: chest pain/ACS  Allergies  Allergen Reactions   Ace Inhibitors Cough   Gemfibrozil Other (See Comments)    Muscle Aches   Moxifloxacin Rash   Ranexa [Ranolazine] Other (See Comments)    "NIGHTMARES"    Patient Measurements: Height: '5\' 2"'$  (157.5 cm) Weight: 76.5 kg (168 lb 11.2 oz) IBW/kg (Calculated) : 50.1 Heparin Dosing Weight: 67 kg  Vital Signs: Temp: 97.8 F (36.6 C) (02/01 1224) Temp Source: Oral (02/01 1224) BP: 142/57 (02/01 1224) Pulse Rate: 71 (02/01 1224)  Labs: Recent Labs    04/29/22 2005 04/29/22 2025 04/29/22 2219 04/30/22 0456 04/30/22 1236  HGB 12.6  --   --  12.4  --   HCT 36.6  --   --  35.3*  --   PLT 167  --   --  158  --   LABPROT 14.0  --   --   --   --   INR 1.1  --   --   --   --   HEPARINUNFRC  --  0.32  --  0.60 0.54  CREATININE 1.16*  --   --   --   --   TROPONINIHS 207*  --  212*  --   --      Estimated Creatinine Clearance: 37.7 mL/min (A) (by C-G formula based on SCr of 1.16 mg/dL (H)).   Medical History: Past Medical History:  Diagnosis Date   Anxiety    Back pain    Cerebrovascular disease    NONOBSTRUCTIVE CVA BY CAROTID DOPPLERS OCTOBER 2008   Chronic renal insufficiency    GFR 64 ML'S PER MINUTE   COPD (chronic obstructive pulmonary disease) (HCC)    Coronary artery disease    a. s/p DES to RCA in 2007 b. patent stent by cath in 12/2014 with nonobstructive disease along LAD and RCA   Coronary atherosclerosis of native coronary artery    NEGATIVE LEXISCAN FOR ISCHEMIA MARCH 23,2010 NORMAL LV FUNCTTION   Depression    Dyspnea    with exertion   GERD (gastroesophageal reflux disease)    Headache    chronic headache from fall in December   Hyperlipidemia    Hypertension    Hypothyroidism    Other and unspecified angina pectoris    ANGINA   Overweight(278.02)    OBESITY   Peptic ulcer disease    Primary  localized osteoarthritis of left knee    Sleep apnea    SEVERE OBSTRUCTIVE    cpap   Stroke ([place])    affected memory   Thyroid disease    TREATED HYPOTHYROIDISM    Medications:  Medications Prior to Admission  Medication Sig Dispense Refill Last Dose   acyclovir (ZOVIRAX) 400 MG tablet Take 400 mg by mouth in the morning.   04/28/2022   albuterol (VENTOLIN HFA) 108 (90 Base) MCG/ACT inhaler Inhale 2 puffs into the lungs every 6 (six) hours as needed for wheezing or shortness of breath. 1 each 2 Past Week   amLODipine (NORVASC) 10 MG tablet Take 10 mg by mouth every morning.  3 04/28/2022   aspirin EC 81 MG tablet Take 81 mg by mouth in the morning.   04/28/2022   buPROPion (WELLBUTRIN SR) 150 MG 12 hr tablet Take 150 mg by mouth 2 (two) times daily.   04/28/2022   citalopram (CELEXA) 20 MG tablet Take 20 mg by mouth every morning.   04/28/2022  clopidogrel (PLAVIX) 75 MG tablet Take 1 tablet (75 mg total) by mouth every morning.   04/28/2022   donepezil (ARICEPT) 5 MG tablet Take 5 mg by mouth every morning.   04/28/2022   hydrALAZINE (APRESOLINE) 25 MG tablet Take 1 tablet (25 mg total) by mouth 2 (two) times daily. 180 tablet 1 04/28/2022   hyoscyamine (LEVBID) 0.375 MG 12 hr tablet TAKE 1 TABLET (0.375 MG TOTAL) BY MOUTH DAILY. 90 tablet 1 04/28/2022   isosorbide mononitrate (IMDUR) 120 MG 24 hr tablet Take 1 tablet (120 mg total) by mouth in the morning and at bedtime. 180 tablet 3 04/28/2022   levothyroxine (SYNTHROID) 75 MCG tablet Take 75 mcg by mouth daily.   04/28/2022   nitroGLYCERIN (NITROSTAT) 0.4 MG SL tablet Place 1 tablet (0.4 mg total) under the tongue every 5 (five) minutes x 3 doses as needed for chest pain (if no relief after 3rd dose, proceed to the ED for an evaluation). (Patient taking differently: Place 0.4 mg under the tongue every 5 (five) minutes x 3 doses as needed for chest pain.) 25 tablet 3 unknown   rosuvastatin (CRESTOR) 20 MG tablet TAKE 1 TABLET BY MOUTH EVERY DAY  (Patient taking differently: Take 20 mg by mouth every evening.) 90 tablet 1 04/28/2022   Thiamine HCl (VITAMIN B-1 PO) Take 1 tablet by mouth daily.   04/28/2022    Assessment: 82 y.o. F presented to [facility] 1/30 with AMS. Pt found to have elevated troponin and started on heparin infusion. Pharmacy consulted to dose heparin as per ACS protocol.  Heparin level 0.54, therapeutic Current heparin infusion rate: 900 units/hr  Hgb 12.4, Plt 158 - stable No s/sx of bleeding, per RN report  Goal of Therapy:  Heparin level 0.3-0.7 units/ml Monitor platelets by anticoagulation protocol: Yes   Plan:  Continue heparin infusion at 900 units/hr Monitor daily CBC, heparin level, and for s/sx of bleeding F/u anticoag plan per cardiology - plan for heparin infusion 24-48 hours  Luisa Hart, PharmD, BCPS Clinical Pharmacist 04/30/2022 2:32 PM   Please refer to [facility] for pharmacy phone number

## 2022-04-30 NOTE — Progress Notes (Signed)
DAILY PROGRESS NOTE   Patient Name: Annette Sharp Date of Encounter: 04/30/2022 Cardiologist: Carlyle Dolly, MD  Chief Complaint   No complaints   Patient Profile   Annette Sharp is a 82 y.o. female with a hx of CAD s/p DES-RCA 2007, hypertension, hyperlipidemia, untreated OSA, prior CVA, and severe dementia who is being seen 04/29/2022 for the evaluation of elevated troponin at the request of Dr. Velia Meyer.   Subjective   No events overnight. No chest pain on IV heparin. Blood pressure has been elevated. Plan yesterday was for medical management of chest pain and elevated troponin which is flat here in the 200's, but was up to 579 at [facility]. Echo is pending today.   Objective   Vitals:   04/29/22 1945 04/29/22 2332 04/30/22 0508 04/30/22 0744  BP: (!) 154/54 (!) 145/59 (!) 142/92 (!) 166/58  Pulse: (!) 55 67 (!) 55 (!) 57  Resp: '18 18 18 19  '$ Temp: 98.3 F (36.8 C) 98.7 F (37.1 C) 98.7 F (37.1 C) 98 F (36.7 C)  TempSrc: Oral Oral Oral Oral  SpO2: 98% 97% 96% 97%  Weight:      Height:        Intake/Output Summary (Last 24 hours) at 04/30/2022 1018 Last data filed at 04/30/2022 2683 Gross per 24 hour  Intake 700.74 ml  Output --  Net 700.74 ml   Filed Weights   04/29/22 1842  Weight: 76.5 kg    Physical Exam   General appearance: alert and no distress Neck: no carotid bruit, no JVD, and thyroid not enlarged, symmetric, no tenderness/mass/nodules Lungs: clear to auscultation bilaterally Heart: regular rate and rhythm, S1, S2 normal, and systolic murmur: early systolic 2/6, blowing at lower left sternal border Abdomen: soft, non-tender; bowel sounds normal; no masses,  no organomegaly Extremities: extremities normal, atraumatic, no cyanosis or edema Pulses: 2+ and symmetric Skin: Skin color, texture, turgor normal. No rashes or lesions Neurologic: Grossly normal Psych: Pleasant  Inpatient Medications    Scheduled Meds:  acyclovir  400 mg Oral q  AM   amLODipine  10 mg Oral q morning   aspirin EC  81 mg Oral q AM   buPROPion  150 mg Oral BID   citalopram  20 mg Oral q morning   clopidogrel  75 mg Oral q morning   donepezil  5 mg Oral q morning   hydrALAZINE  25 mg Oral BID   isosorbide mononitrate  120 mg Oral Daily   levothyroxine  75 mcg Oral Daily   rosuvastatin  20 mg Oral Daily   thiamine  100 mg Oral Daily    Continuous Infusions:  heparin 900 Units/hr (04/30/22 0437)    PRN Meds: acetaminophen, albuterol, nitroGLYCERIN, ondansetron (ZOFRAN) IV   Labs   Results for orders placed or performed during the hospital encounter of 04/29/22 (from the past 48 hour(s))  Comprehensive metabolic panel     Status: Abnormal   Collection Time: 04/29/22  8:05 PM  Result Value Ref Range   Sodium 136 135 - 145 mmol/L   Potassium 3.7 3.5 - 5.1 mmol/L   Chloride 104 98 - 111 mmol/L   CO2 24 22 - 32 mmol/L   Glucose, Bld 98 70 - 99 mg/dL    Comment: Glucose reference range applies only to samples taken after fasting for at least 8 hours.   BUN 10 8 - 23 mg/dL   Creatinine, Ser 1.16 (H) 0.44 - 1.00 mg/dL   Calcium  8.7 (L) 8.9 - 10.3 mg/dL   Total Protein 6.3 (L) 6.5 - 8.1 g/dL   Albumin 3.6 3.5 - 5.0 g/dL   AST 51 (H) 15 - 41 U/L   ALT 55 (H) 0 - 44 U/L   Alkaline Phosphatase 48 38 - 126 U/L   Total Bilirubin 0.5 0.3 - 1.2 mg/dL   GFR, Estimated 48 (L) >60 mL/min    Comment: (NOTE) Calculated using the CKD-EPI Creatinine Equation (2021)    Anion gap 8 5 - 15    Comment: Performed at [facility] [address], [place], [place] [address]  TSH     Status: None   Collection Time: 04/29/22  8:05 PM  Result Value Ref Range   TSH 3.807 0.350 - 4.500 uIU/mL    Comment: Performed by a 3rd Generation assay with a functional sensitivity of <=0.01 uIU/mL. Performed at [facility], [place] [address], [place], [place] [address]   Troponin I (High Sensitivity)     Status: Abnormal   Collection Time: 04/29/22  8:05 PM   Result Value Ref Range   Troponin I (High Sensitivity) 207 (HH) <18 ng/L    Comment: CRITICAL RESULT CALLED TO, READ BACK BY AND VERIFIED WITH Benita Gutter DAVIDSON RN 04/29/22 2139 Wiliam Ke (NOTE) Elevated high sensitivity troponin I (hsTnI) values and significant  changes across serial measurements may suggest ACS but many other  chronic and acute conditions are known to elevate hsTnI results.  Refer to the "Links" section for chest pain algorithms and additional  guidance. Performed at [facility], [place] [address] [place], [place] [address]   CBC with Differential/Platelet     Status: None   Collection Time: 04/29/22  8:05 PM  Result Value Ref Range   WBC 5.8 4.0 - 10.5 K/uL   RBC 3.96 3.87 - 5.11 MIL/uL   Hemoglobin 12.6 12.0 - 15.0 g/dL   HCT 36.6 36.0 - 46.0 %   MCV 92.4 80.0 - 100.0 fL   MCH 31.8 26.0 - 34.0 pg   MCHC 34.4 30.0 - 36.0 g/dL   RDW 13.0 11.5 - 15.5 %   Platelets 167 150 - 400 K/uL   nRBC 0.0 0.0 - 0.2 %   Neutrophils Relative % 57 %   Neutro Abs 3.3 1.7 - 7.7 K/uL   Lymphocytes Relative 27 %   Lymphs Abs 1.6 0.7 - 4.0 K/uL   Monocytes Relative 13 %   Monocytes Absolute 0.8 0.1 - 1.0 K/uL   Eosinophils Relative 2 %   Eosinophils Absolute 0.1 0.0 - 0.5 K/uL   Basophils Relative 1 %   Basophils Absolute 0.0 0.0 - 0.1 K/uL   Immature Granulocytes 0 %   Abs Immature Granulocytes 0.02 0.00 - 0.07 K/uL    Comment: Performed at [facility], [address], [place], [place] [address]  Protime-INR     Status: None   Collection Time: 04/29/22  8:05 PM  Result Value Ref Range   Prothrombin Time 14.0 11.4 - 15.2 seconds   INR 1.1 0.8 - 1.2    Comment: (NOTE) INR goal varies based on device and disease states. Performed at [facility], [place] [address], [place], [place] [address]   Heparin level (unfractionated)     Status: None   Collection Time: 04/29/22  8:25 PM  Result Value Ref Range   Heparin Unfractionated 0.32 0.30 - 0.70 IU/mL     Comment: (NOTE) The clinical reportable  range upper limit is being lowered to >1.10 to align with the FDA approved guidance for the current laboratory assay.  If heparin results are below expected values, and patient dosage has  been confirmed, suggest follow up testing of antithrombin III levels. Performed at [facility], [place] [address], [place], [place] [address]   Troponin I (High Sensitivity)     Status: Abnormal   Collection Time: 04/29/22 10:19 PM  Result Value Ref Range   Troponin I (High Sensitivity) 212 (HH) <18 ng/L    Comment: CRITICAL VALUE NOTED. VALUE IS CONSISTENT WITH PREVIOUSLY REPORTED/CALLED VALUE (NOTE) Elevated high sensitivity troponin I (hsTnI) values and significant  changes across serial measurements may suggest ACS but many other  chronic and acute conditions are known to elevate hsTnI results.  Refer to the "Links" section for chest pain algorithms and additional  guidance. Performed at [facility], [place] [address], [place], [place] [address]   Heparin level (unfractionated)     Status: None   Collection Time: 04/30/22  4:56 AM  Result Value Ref Range   Heparin Unfractionated 0.60 0.30 - 0.70 IU/mL    Comment: (NOTE) The clinical reportable range upper limit is being lowered to >1.10 to align with the FDA approved guidance for the current laboratory assay.  If heparin results are below expected values, and patient dosage has  been confirmed, suggest follow up testing of antithrombin III levels. Performed at [facility], [place] [address], [place], [place] [address]   CBC     Status: Abnormal   Collection Time: 04/30/22  4:56 AM  Result Value Ref Range   WBC 5.4 4.0 - 10.5 K/uL   RBC 3.84 (L) 3.87 - 5.11 MIL/uL   Hemoglobin 12.4 12.0 - 15.0 g/dL   HCT 35.3 (L) 36.0 - 46.0 %   MCV 91.9 80.0 - 100.0 fL   MCH 32.3 26.0 - 34.0 pg   MCHC 35.1 30.0 - 36.0 g/dL   RDW 13.1 11.5 - 15.5 %   Platelets 158 150 - 400 K/uL   nRBC 0.0 0.0  - 0.2 %    Comment: Performed at [facility], [place] [address] [place], [place] [address]    ECG   Sinus bradycardia with PAC's at 55 - Personally Reviewed  Telemetry   Sinus bradycardia - Personally Reviewed  Radiology    MR BRAIN WO CONTRAST  Result Date: 04/29/2022 CLINICAL DATA:  Altered mental status EXAM: MRI HEAD WITHOUT CONTRAST TECHNIQUE: Multiplanar, multiecho pulse sequences of the brain and surrounding structures were obtained without intravenous contrast. COMPARISON:  11/17/2021 FINDINGS: Brain: No acute infarct, mass effect or extra-axial collection. No chronic microhemorrhage or siderosis. There is multifocal hyperintense T2-weighted signal within the white matter. Parenchymal volume and CSF spaces are normal. 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 The midline structures are normal. Vascular: Normal flow voids. Skull and upper cervical spine: Normal marrow signal. Sinuses/Orbits: Right sphenoid sinus mucosal thickening. Bilateral ocular lens replacements. Other: None IMPRESSION: 1. No acute intracranial abnormality. 2. 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 3. Findings of chronic small vessel ischemia. Electronically Signed   By: Ulyses Jarred M.D.   On: 04/29/2022 23:58    Cardiac Studies   N/A  Assessment   Principal Problem:   NSTEMI (non-ST elevated myocardial infarction) [facility]) Active Problems:   Hyperlipidemia   Essential hypertension   Acute encephalopathy   Hypothyroidism   Vascular dementia (  [place])   Plan   No further chest pain - troponin flat here. Echo is pending. On IV Heparin - plan was 24-48 hrs. BP remains elevated. Will increase hydralazine to 50 mg BID. No BB d/t bradycardia. She is on imdur 120 and amlodipine 10 mg daily.  Time Spent Directly with Patient:  I have spent a total of 25 minutes with the patient reviewing hospital notes, telemetry, EKGs, labs and examining the  patient as well as establishing an assessment and plan that was discussed personally with the patient.  > 50% of time was spent in direct patient care.  Length of Stay:  LOS: 1 day   Pixie Casino, MD, [facility], [place] Director of the Advanced Lipid Disorders &  Cardiovascular Risk Reduction Clinic Diplomate of the American Board of Clinical Lipidology Attending Cardiologist  Direct Dial: [phone]  Fax: [phone]  Website:  [email or web address]  Nadean Corwin Phoenicia Pirie 04/30/2022, 10:18 AM

## 2022-05-01 DIAGNOSIS — I214 Non-ST elevation (NSTEMI) myocardial infarction: Secondary | ICD-10-CM | POA: Diagnosis not present

## 2022-05-01 LAB — CBC
HCT: 35.9 % — ABNORMAL LOW (ref 36.0–46.0)
Hemoglobin: 12.3 g/dL (ref 12.0–15.0)
MCH: 31.5 pg (ref 26.0–34.0)
MCHC: 34.3 g/dL (ref 30.0–36.0)
MCV: 92.1 fL (ref 80.0–100.0)
Platelets: 164 10*3/uL (ref 150–400)
RBC: 3.9 MIL/uL (ref 3.87–5.11)
RDW: 13 % (ref 11.5–15.5)
WBC: 4.9 10*3/uL (ref 4.0–10.5)
nRBC: 0 % (ref 0.0–0.2)

## 2022-05-01 LAB — HEPARIN LEVEL (UNFRACTIONATED): Heparin Unfractionated: 0.49 IU/mL (ref 0.30–0.70)

## 2022-05-01 MED ORDER — HYDRALAZINE HCL 50 MG PO TABS: 50.0000 mg | ORAL_TABLET | Freq: Two times a day (BID) | ORAL | 0 refills | Status: AC

## 2022-05-01 NOTE — Evaluation (Addendum)
Occupational Therapy Evaluation Patient Details Name: Annette Sharp MRN: [id number] DOB: Aug 25, 1940 Today's Date: 05/01/2022   History of Present Illness Pt is 82 year old presented to [facility] on  04/29/22 from [facility] with confusion and chest pain. Pt with NSTEMI. PMH - dementia, CAD, htn, OSA, CVA, bil TKR, bil shoulder replacement   Clinical Impression   PTA patient reports independent with ADLs and mobility. Admitted for above and presents with problem list below.  Pt with hx of dementia, but following commands and engaging appropriately. Reports spouse assists with med mgmt and drives.  She completes Adls with min guard assist, transfers and mobility with min guard assist.  Attempted no AD and RW, improved stability with UE support using RW.  Educated on safety and fall prevention, agreeable to use [place] in shower for safety. Believe she will benefit from continued OT services acutely to optimize independence, safety and return to PLOF. Anticipate she will progress well with no further needs after dc home.  Will follow.      Recommendations for follow up therapy are one component of a multi-disciplinary discharge planning process, led by the attending physician.  Recommendations may be updated based on patient status, additional functional criteria and insurance authorization.   Follow Up Recommendations  No OT follow up     Assistance Recommended at Discharge Intermittent Supervision/Assistance  Patient can return home with the following A little help with walking and/or transfers;A little help with bathing/dressing/bathroom;Assist for transportation;Direct supervision/assist for medications management;Direct supervision/assist for financial management;Assistance with cooking/housework    Functional Status Assessment  Patient has had a recent decline in their functional status and demonstrates the ability to make significant improvements in function in a reasonable and predictable amount  of time.  Equipment Recommendations  None recommended by OT    Recommendations for Other Services       Precautions / Restrictions Precautions Precautions: Fall Restrictions Weight Bearing Restrictions: No      Mobility Bed Mobility Overal bed mobility: Modified Independent                  Transfers Overall transfer level: Needs assistance Equipment used: Rolling walker (2 wheels) Transfers: Sit to/from Stand Sit to Stand: Supervision           General transfer comment: supervision for safety and lines      Balance Overall balance assessment: Needs assistance, History of Falls Sitting-balance support: No upper extremity supported, Feet supported Sitting balance-Leahy Scale: Good     Standing balance support: Bilateral upper extremity supported, No upper extremity supported, During functional activity Standing balance-Leahy Scale: Fair                             ADL either performed or assessed with clinical judgement   ADL Overall ADL's : Needs assistance/impaired     Grooming: Min guard;Standing;Wash/dry hands           Upper Body Dressing : Set up;Sitting   Lower Body Dressing: Supervision/safety;Sit to/from stand   Toilet Transfer: Min guard;Ambulation;Rolling walker (2 wheels)   Toileting- Clothing Manipulation and Hygiene: Supervision/safety;Sit to/from stand       Functional mobility during ADLs: Min guard;Rolling walker (2 wheels);Cueing for safety       Vision         Perception     Praxis      Pertinent Vitals/Pain Pain Assessment Pain Assessment: Faces Faces Pain Scale: No hurt Pain Intervention(s):  Monitored during session     Hand Dominance Right   Extremity/Trunk Assessment Upper Extremity Assessment Upper Extremity Assessment: Generalized weakness   Lower Extremity Assessment Lower Extremity Assessment: Defer to PT evaluation       Communication Communication Communication: No  difficulties   Cognition Arousal/Alertness: Awake/alert Behavior During Therapy: WFL for tasks assessed/performed Overall Cognitive Status: History of cognitive impairments - at baseline                                 General Comments: appropriate throughout session     General Comments  VSS on RA    Exercises     Shoulder Instructions      Home Living Family/patient expects to be discharged to:: Private residence Living Arrangements: Spouse/significant other Available Help at Discharge: Family;Available 24 hours/day Type of Home: House Home Access: Stairs to enter;Ramped entrance Entrance Stairs-Number of Steps: 2-3   Home Layout: One level     Bathroom Shower/Tub: Occupational psychologist: Standard     Home Equipment: Conservation officer, nature (2 wheels);Cane - single point;BSC/3in1;Shower seat - built in          Prior Functioning/Environment Prior Level of Function : Independent/Modified Independent;History of Falls (last six months)             Mobility Comments: no AD ADLs Comments: independent ADLs, limited IADLs; not driving and spouse manages meds        OT Problem List: Decreased strength;Decreased activity tolerance;Impaired balance (sitting and/or standing);Decreased safety awareness;Decreased cognition;Decreased knowledge of use of DME or AE;Decreased knowledge of precautions      OT Treatment/Interventions: Self-care/ADL training;Therapeutic exercise;DME and/or AE instruction;Therapeutic activities;Patient/family education;Balance training    OT Goals(Current goals can be found in the care plan section) Acute Rehab OT Goals Patient Stated Goal: home OT Goal Formulation: With patient/family Time For Goal Achievement: 05/15/22 Potential to Achieve Goals: Good  OT Frequency: Min 2X/week    Co-evaluation              AM-PAC OT "6 Clicks" Daily Activity     Outcome Measure Help from another person eating meals?: None Help  from another person taking care of personal grooming?: A Little Help from another person toileting, which includes using toliet, bedpan, or urinal?: A Little Help from another person bathing (including washing, rinsing, drying)?: A Little Help from another person to put on and taking off regular upper body clothing?: A Little Help from another person to put on and taking off regular lower body clothing?: A Little 6 Click Score: 19   End of Session Equipment Utilized During Treatment: Rolling walker (2 wheels) Nurse Communication: Mobility status  Activity Tolerance: Patient tolerated treatment well Patient left: in bed;with call bell/phone within reach;with bed alarm set;with family/visitor present  OT Visit Diagnosis: Other abnormalities of gait and mobility (R26.89);Muscle weakness (generalized) (M62.81)                Time: 1610-9604 OT Time Calculation (min): 12 min Charges:  OT General Charges $OT Visit: 1 Visit OT Evaluation $OT Eval Low Complexity: 1 Low  Jolaine Artist, OT Acute Rehabilitation Services Office [phone]   Annette Sharp 05/01/2022, 1:25 PM

## 2022-05-01 NOTE — Progress Notes (Signed)
ANTICOAGULATION CONSULT NOTE - Follow-Up Consult  Pharmacy Consult for Heparin infusion Indication: chest pain/ACS  Allergies  Allergen Reactions   Ace Inhibitors Cough   Gemfibrozil Other (See Comments)    Muscle Aches   Moxifloxacin Rash   Ranexa [Ranolazine] Other (See Comments)    "NIGHTMARES"    Patient Measurements: Height: '5\' 2"'$  (157.5 cm) Weight: 76.5 kg (168 lb 11.2 oz) IBW/kg (Calculated) : 50.1 Heparin Dosing Weight: 67 kg  Vital Signs: Temp: 98 F (36.7 C) (02/02 0454) Temp Source: Oral (02/02 0454) BP: 140/54 (02/02 0454) Pulse Rate: 54 (02/02 0454)  Labs: Recent Labs    04/29/22 2005 04/29/22 2025 04/29/22 2219 04/30/22 0456 04/30/22 1236 05/01/22 0209  HGB 12.6  --   --  12.4  --  12.3  HCT 36.6  --   --  35.3*  --  35.9*  PLT 167  --   --  158  --  164  LABPROT 14.0  --   --   --   --   --   INR 1.1  --   --   --   --   --   HEPARINUNFRC  --    < >  --  0.60 0.54 0.49  CREATININE 1.16*  --   --   --   --   --   TROPONINIHS 207*  --  212*  --   --   --    < > = values in this interval not displayed.     Estimated Creatinine Clearance: 37.7 mL/min (A) (by C-G formula based on SCr of 1.16 mg/dL (H)).   Medical History: Past Medical History:  Diagnosis Date   Anxiety    Back pain    Cerebrovascular disease    NONOBSTRUCTIVE CVA BY CAROTID DOPPLERS OCTOBER 2008   Chronic renal insufficiency    GFR 57 ML'S PER MINUTE   COPD (chronic obstructive pulmonary disease) (HCC)    Coronary artery disease    a. s/p DES to RCA in 2007 b. patent stent by cath in 12/2014 with nonobstructive disease along LAD and RCA   Coronary atherosclerosis of native coronary artery    NEGATIVE LEXISCAN FOR ISCHEMIA MARCH 23,2010 NORMAL LV FUNCTTION   Depression    Dyspnea    with exertion   GERD (gastroesophageal reflux disease)    Headache    chronic headache from fall in December   Hyperlipidemia    Hypertension    Hypothyroidism    Other and unspecified  angina pectoris    ANGINA   Overweight(278.02)    OBESITY   Peptic ulcer disease    Primary localized osteoarthritis of left knee    Sleep apnea    SEVERE OBSTRUCTIVE    cpap   Stroke ([place])    affected memory   Thyroid disease    TREATED HYPOTHYROIDISM    Medications:  Medications Prior to Admission  Medication Sig Dispense Refill Last Dose   acyclovir (ZOVIRAX) 400 MG tablet Take 400 mg by mouth in the morning.   04/28/2022   albuterol (VENTOLIN HFA) 108 (90 Base) MCG/ACT inhaler Inhale 2 puffs into the lungs every 6 (six) hours as needed for wheezing or shortness of breath. 1 each 2 Past Week   amLODipine (NORVASC) 10 MG tablet Take 10 mg by mouth every morning.  3 04/28/2022   aspirin EC 81 MG tablet Take 81 mg by mouth in the morning.   04/28/2022   buPROPion (WELLBUTRIN SR) 150 MG 12  hr tablet Take 150 mg by mouth 2 (two) times daily.   04/28/2022   citalopram (CELEXA) 20 MG tablet Take 20 mg by mouth every morning.   04/28/2022   clopidogrel (PLAVIX) 75 MG tablet Take 1 tablet (75 mg total) by mouth every morning.   04/28/2022   donepezil (ARICEPT) 5 MG tablet Take 5 mg by mouth every morning.   04/28/2022   hydrALAZINE (APRESOLINE) 25 MG tablet Take 1 tablet (25 mg total) by mouth 2 (two) times daily. 180 tablet 1 04/28/2022   hyoscyamine (LEVBID) 0.375 MG 12 hr tablet TAKE 1 TABLET (0.375 MG TOTAL) BY MOUTH DAILY. 90 tablet 1 04/28/2022   isosorbide mononitrate (IMDUR) 120 MG 24 hr tablet Take 1 tablet (120 mg total) by mouth in the morning and at bedtime. 180 tablet 3 04/28/2022   levothyroxine (SYNTHROID) 75 MCG tablet Take 75 mcg by mouth daily.   04/28/2022   nitroGLYCERIN (NITROSTAT) 0.4 MG SL tablet Place 1 tablet (0.4 mg total) under the tongue every 5 (five) minutes x 3 doses as needed for chest pain (if no relief after 3rd dose, proceed to the ED for an evaluation). (Patient taking differently: Place 0.4 mg under the tongue every 5 (five) minutes x 3 doses as needed for chest  pain.) 25 tablet 3 unknown   rosuvastatin (CRESTOR) 20 MG tablet TAKE 1 TABLET BY MOUTH EVERY DAY (Patient taking differently: Take 20 mg by mouth every evening.) 90 tablet 1 04/28/2022   Thiamine HCl (VITAMIN B-1 PO) Take 1 tablet by mouth daily.   04/28/2022    Assessment: 82 y.o. F [facility]/30 with AMS. Pt found to have elevated troponin and started on heparin infusion. Pharmacy consulted to dose heparin as per ACS protocol.  Heparin level 0.49, therapeutic Current heparin infusion rate: 900 units/hr  Hgb 12.3, Plt 164 - stable No s/sx of bleeding, per RN report  Goal of Therapy:  Heparin level 0.3-0.7 units/ml Monitor platelets by anticoagulation protocol: Yes   Plan:  Continue heparin infusion at 900 units/hr Monitor daily CBC, heparin level, and for s/sx of bleeding F/u anticoag plan per cardiology - plan for heparin infusion 24-48 hours  Luisa Hart, PharmD, BCPS Clinical Pharmacist 05/01/2022 8:00 AM   Please refer to [facility] for pharmacy phone number

## 2022-05-01 NOTE — Discharge Summary (Signed)
Physician Discharge Summary  Annette Sharp:[id number] DOB: 1940/12/14 DOA: 04/29/2022  PCP: Manon Hilding, MD  Admit date: 04/29/2022 Discharge date: 05/01/2022  Admitted From: Home Disposition: Home  Recommendations for Outpatient Follow-up:  Follow up with PCP in 1-2 weeks Cardiology will schedule follow-up  Home Health: N/A Equipment/Devices: Walker  Discharge Condition: Stable CODE STATUS: Full code Diet recommendation: Low-salt diet  Discharge summary: 82 year old with history of coronary artery disease status post PCI and stent 8 years ago, recent admission 2 months ago with non-STEMI treated with 48 hours of heparin in the hospital and discharged home.  Presented with 1 day of worsening confusion and found to have elevated troponins so transferred from outside hospital to [facility] with cardiology consultation.  Does have history of hypertension, coronary artery disease, prior stroke and sleep apnea unable to tolerate CPAP.  Does have dementia but functional at home with husband.  Recent multiple neurological workups.  Troponins at [facility].     Non-STEMI: Unreliable historian, advanced dementia.  Currently chest pain-free. Treated with heparin for 48 hours.  Symptomatically better.  Plan to continue  Aspirin, Plavix, nitrates, hydralazine and Imdur.  Already on Crestor.  Followed by cardiology.  Decided conservative treatment because patient was very poor candidate for invasive procedures.  Stable to discharge home with outpatient follow-up.   Acute metabolic encephalopathy: Probably due to above.  Recent extensive investigations including EEG and MRI. MRI on admission 1/31 shows no acute neurochanges.  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  TSH is 3.8. Her mentation has improved as per family.   Hypothyroidism: On Synthroid.  TSH 3.8.   Essential hypertension: Resume home medications including amlodipine.  Resume Imdur  and hydralazine.  Blood pressures were persistently elevated.  Increasing dose of hydralazine to 50 mg twice daily as recommended by cardiology.   Dementia: Has adequate support system at home.  Currently on Celexa, Aricept.  Resume on discharge.  Stable for discharge.  Discharge Diagnoses:  Principal Problem:   NSTEMI (non-ST elevated myocardial infarction) [facility]) Active Problems:   Acute encephalopathy   Vascular dementia ([place])   Hyperlipidemia   Essential hypertension   Hypothyroidism    Discharge Instructions  Discharge Instructions     Diet - low sodium heart healthy   Complete by: As directed    Increase activity slowly   Complete by: As directed       Allergies as of 05/01/2022       Reactions   Ace Inhibitors Cough   Gemfibrozil Other (See Comments)   Muscle Aches   Moxifloxacin Rash   Ranexa [ranolazine] Other (See Comments)   "NIGHTMARES"        Medication List     TAKE these medications    acyclovir 400 MG tablet Commonly known as: ZOVIRAX Take 400 mg by mouth in the morning.   albuterol 108 (90 Base) MCG/ACT inhaler Commonly known as: VENTOLIN HFA Inhale 2 puffs into the lungs every 6 (six) hours as needed for wheezing or shortness of breath.   amLODipine 10 MG tablet Commonly known as: NORVASC Take 10 mg by mouth every morning.   aspirin EC 81 MG tablet Take 81 mg by mouth in the morning.   buPROPion 150 MG 12 hr tablet Commonly known as: WELLBUTRIN SR Take 150 mg by mouth 2 (two) times daily.   citalopram 20 MG tablet Commonly known as: CELEXA Take 20 mg by mouth every morning.   clopidogrel 75 MG  tablet Commonly known as: PLAVIX Take 1 tablet (75 mg total) by mouth every morning.   donepezil 5 MG tablet Commonly known as: ARICEPT Take 5 mg by mouth every morning.   hydrALAZINE 50 MG tablet Commonly known as: APRESOLINE Take 1 tablet (50 mg total) by mouth 2 (two) times daily. What changed:  medication strength how much to  take   hyoscyamine 0.375 MG 12 hr tablet Commonly known as: LEVBID TAKE 1 TABLET (0.375 MG TOTAL) BY MOUTH DAILY.   isosorbide mononitrate 120 MG 24 hr tablet Commonly known as: IMDUR Take 1 tablet (120 mg total) by mouth in the morning and at bedtime.   levothyroxine 75 MCG tablet Commonly known as: SYNTHROID Take 75 mcg by mouth daily.   nitroGLYCERIN 0.4 MG SL tablet Commonly known as: NITROSTAT Place 1 tablet (0.4 mg total) under the tongue every 5 (five) minutes x 3 doses as needed for chest pain (if no relief after 3rd dose, proceed to the ED for an evaluation). What changed: reasons to take this   rosuvastatin 20 MG tablet Commonly known as: CRESTOR TAKE 1 TABLET BY MOUTH EVERY DAY What changed: when to take this   VITAMIN B-1 PO Take 1 tablet by mouth daily.        Follow-up Information     Finis Bud, NP Follow up.   Specialty: Cardiology Why: Hospital follow-up with Cardiology scheduled for 05/11/2022 at 10:30am. Please arrive 15 minutes early for check-in. If this date/time does not work for you, please call our office to reschedule. Contact information: [address] [place] [address] [phone]                Allergies  Allergen Reactions   Ace Inhibitors Cough   Gemfibrozil Other (See Comments)    Muscle Aches   Moxifloxacin Rash   Ranexa [Ranolazine] Other (See Comments)    "NIGHTMARES"    Consultations: Cardiology   Procedures/Studies: ECHOCARDIOGRAM COMPLETE  Result Date: 04/30/2022    ECHOCARDIOGRAM REPORT   Patient Name:   Annette Sharp Date of Exam: 04/30/2022 Medical Rec #:  [id number]     Height:       62.0 in Accession #:    [id number]    Weight:       168.7 lb Date of Birth:  01/21/1941      BSA:          1.778 m Patient Age:    58 years      BP:           165/58 mmHg Patient Gender: F             HR:           54 bpm. Exam Location:  Inpatient Procedure: 2D Echo, Cardiac Doppler, Color Doppler and 3D Echo Indications:     Chest Pain R07.9  History:        Patient has prior history of Echocardiogram examinations, most                 recent 01/05/2022. NSTEMI and CAD, Signs/Symptoms:Dyspnea and                 Chest Pain; Risk Factors:Hypertension, Dyslipidemia, Non-Smoker                 and Sleep Apnea.  Sonographer:    Greer Pickerel Referring Phys: [id number] Ramona DUKE  Sonographer Comments: Image acquisition challenging due to patient body habitus, Image acquisition  challenging due to COPD and Image acquisition challenging due to respiratory motion. Global longitudinal strain was attempted. IMPRESSIONS  1. Left ventricular ejection fraction, by estimation, is 65 to 70%. The left ventricle has normal function. The left ventricle has no regional wall motion abnormalities. Left ventricular diastolic parameters are consistent with Grade II diastolic dysfunction (pseudonormalization).  2. Right ventricular systolic function is normal. The right ventricular size is normal. There is normal pulmonary artery systolic pressure.  3. Left atrial size was moderately dilated.  4. The mitral valve is degenerative. Trivial mitral valve regurgitation. No evidence of mitral stenosis.  5. The aortic valve was not well visualized. Aortic valve regurgitation is mild. Mild aortic valve stenosis.  6. The inferior vena cava is normal in size with greater than 50% respiratory variability, suggesting right atrial pressure of 3 mmHg. FINDINGS  Left Ventricle: Left ventricular ejection fraction, by estimation, is 65 to 70%. The left ventricle has normal function. The left ventricle has no regional wall motion abnormalities. The left ventricular internal cavity size was normal in size. There is  no left ventricular hypertrophy. Left ventricular diastolic parameters are consistent with Grade II diastolic dysfunction (pseudonormalization). Right Ventricle: The right ventricular size is normal. No increase in right ventricular wall thickness. Right  ventricular systolic function is normal. There is normal pulmonary artery systolic pressure. The tricuspid regurgitant velocity is 2.41 m/s, and  with an assumed right atrial pressure of 3 mmHg, the estimated right ventricular systolic pressure is 75.6 mmHg. Left Atrium: Left atrial size was moderately dilated. Right Atrium: Right atrial size was normal in size. Pericardium: There is no evidence of pericardial effusion. Mitral Valve: The mitral valve is degenerative in appearance. Trivial mitral valve regurgitation. No evidence of mitral valve stenosis. Tricuspid Valve: The tricuspid valve is normal in structure. Tricuspid valve regurgitation is not demonstrated. No evidence of tricuspid stenosis. Aortic Valve: The aortic valve was not well visualized. Aortic valve regurgitation is mild. Aortic regurgitation PHT measures 923 msec. Mild aortic stenosis is present. Aortic valve mean gradient measures 9.0 mmHg. Aortic valve peak gradient measures 15.5 mmHg. Aortic valve area, by VTI measures 1.84 cm. Pulmonic Valve: The pulmonic valve was normal in structure. Pulmonic valve regurgitation is not visualized. No evidence of pulmonic stenosis. Aorta: The aortic root is normal in size and structure. Venous: The inferior vena cava is normal in size with greater than 50% respiratory variability, suggesting right atrial pressure of 3 mmHg. IAS/Shunts: No atrial level shunt detected by color flow Doppler.  LEFT VENTRICLE PLAX 2D LVIDd:         3.60 cm   Diastology LVIDs:         2.00 cm   LV e' medial:    5.87 cm/s LV PW:         1.10 cm   LV E/e' medial:  23.7 LV IVS:        0.90 cm   LV e' lateral:   7.72 cm/s LVOT diam:     1.80 cm   LV E/e' lateral: 18.0 LV SV:         75 LV SV Index:   42 LVOT Area:     2.54 cm                           3D Volume EF:  3D EF:        56 %                          LV EDV:       100 ml                          LV ESV:       44 ml                          LV SV:         56 ml RIGHT VENTRICLE RV S prime:     15.80 cm/s TAPSE (M-mode): 2.7 cm LEFT ATRIUM             Index        RIGHT ATRIUM           Index LA diam:        4.00 cm 2.25 cm/m   RA Area:     18.40 cm LA Vol (A2C):   49.4 ml 27.78 ml/m  RA Volume:   50.80 ml  28.57 ml/m LA Vol (A4C):   61.2 ml 34.41 ml/m LA Biplane Vol: 56.0 ml 31.49 ml/m  AORTIC VALVE AV Area (Vmax):    1.69 cm AV Area (Vmean):   1.68 cm AV Area (VTI):     1.84 cm AV Vmax:           197.00 cm/s AV Vmean:          138.500 cm/s AV VTI:            0.409 m AV Peak Grad:      15.5 mmHg AV Mean Grad:      9.0 mmHg LVOT Vmax:         131.00 cm/s LVOT Vmean:        91.500 cm/s LVOT VTI:          0.295 m LVOT/AV VTI ratio: 0.72 AI PHT:            923 msec  AORTA Ao Root diam: 2.70 cm Ao Asc diam:  3.10 cm MITRAL VALVE                TRICUSPID VALVE MV Area (PHT): 2.55 cm     TR Peak grad:   23.2 mmHg MV Decel Time: 297 msec     TR Vmax:        241.00 cm/s MV E velocity: 139.00 cm/s MV A velocity: 78.80 cm/s   SHUNTS MV E/A ratio:  1.76         Systemic VTI:  0.30 m                             Systemic Diam: 1.80 cm Glori Bickers MD Electronically signed by Glori Bickers MD Signature Date/Time: 04/30/2022/7:05:36 PM    Final    MR BRAIN WO CONTRAST  Result Date: 04/29/2022 CLINICAL DATA:  Altered mental status EXAM: MRI HEAD WITHOUT CONTRAST TECHNIQUE: Multiplanar, multiecho pulse sequences of the brain and surrounding structures were obtained without intravenous contrast. COMPARISON:  11/17/2021 FINDINGS: Brain: No acute infarct, mass effect or extra-axial collection. No chronic microhemorrhage or siderosis. There is multifocal hyperintense T2-weighted signal within the white matter. Parenchymal volume and CSF spaces are normal. There are multiple old bilateral cerebellar infarcts, greater on  the left. The midline structures are normal. Vascular: Normal flow voids. Skull and upper cervical spine: Normal marrow signal. Sinuses/Orbits: Right  sphenoid sinus mucosal thickening. Bilateral ocular lens replacements. Other: None IMPRESSION: 1. No acute intracranial abnormality. 2. 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 3. Findings of chronic small vessel ischemia. Electronically Signed   By: Ulyses Jarred M.D.   On: 04/29/2022 23:58   (Echo, Carotid, EGD, Colonoscopy, ERCP)    Subjective: Seen in the morning rounds.  Husband at the bedside.  Comfortable and full of sense of humor.  Patient tells me that she does not remember anything "anyway I do not remember anything".  Walked around with physical therapy.  Mobility is stable.  Husband is comfortable taking her home.   Discharge Exam: Vitals:   05/01/22 0454 05/01/22 0800  BP: (!) 140/54 (!) 145/63  Pulse: (!) 54 64  Resp: 15 18  Temp: 98 F (36.7 C) 98 F (36.7 C)  SpO2: 97%    Vitals:   04/30/22 2006 05/01/22 0020 05/01/22 0454 05/01/22 0800  BP: (!) 146/54 (!) 159/56 (!) 140/54 (!) 145/63  Pulse: (!) 58 (!) 58 (!) 54 64  Resp: '17 15 15 18  '$ Temp: 98.4 F (36.9 C) 97.7 F (36.5 C) 98 F (36.7 C) 98 F (36.7 C)  TempSrc: Oral Oral Oral Oral  SpO2: 97% 97% 97%   Weight:      Height:        General: Pt is alert, awake, not in acute distress Pleasant.  Oriented to herself.  Interactive with some memory issues. Cardiovascular: RRR, S1/S2 +, no rubs, no gallops Respiratory: CTA bilaterally, no wheezing, no rhonchi Abdominal: Soft, NT, ND, bowel sounds + Extremities: no edema, no cyanosis    The results of significant diagnostics from this hospitalization (including imaging, microbiology, ancillary and laboratory) are listed below for reference.     Microbiology: No results found for this or any previous visit (from the past 240 hour(s)).   Labs: BNP (last 3 results) No results for input(s): "BNP" in the last 8760 hours. Basic Metabolic Panel: Recent Labs  Lab 04/29/22 2005  NA 136  K 3.7  CL 104  CO2 24  GLUCOSE 98  BUN 10   CREATININE 1.16*  CALCIUM 8.7*   Liver Function Tests: Recent Labs  Lab 04/29/22 2005  AST 51*  ALT 55*  ALKPHOS 48  BILITOT 0.5  PROT 6.3*  ALBUMIN 3.6   No results for input(s): "LIPASE", "AMYLASE" in the last 168 hours. No results for input(s): "AMMONIA" in the last 168 hours. CBC: Recent Labs  Lab 04/29/22 2005 04/30/22 0456 05/01/22 0209  WBC 5.8 5.4 4.9  NEUTROABS 3.3  --   --   HGB 12.6 12.4 12.3  HCT 36.6 35.3* 35.9*  MCV 92.4 91.9 92.1  PLT 167 158 164   Cardiac Enzymes: No results for input(s): "CKTOTAL", "CKMB", "CKMBINDEX", "TROPONINI" in the last 168 hours. BNP: Invalid input(s): "POCBNP" CBG: No results for input(s): "GLUCAP" in the last 168 hours. D-Dimer No results for input(s): "DDIMER" in the last 72 hours. Hgb A1c No results for input(s): "HGBA1C" in the last 72 hours. Lipid Profile No results for input(s): "CHOL", "HDL", "LDLCALC", "TRIG", "CHOLHDL", "LDLDIRECT" in the last 72 hours. Thyroid function studies Recent Labs    04/29/22 2005  TSH 3.807   Anemia work up No results for input(s): "VITAMINB12", "FOLATE", "FERRITIN", "TIBC", "IRON", "RETICCTPCT" in the last 72 hours. Urinalysis    Component  Value Date/Time   COLORURINE YELLOW 05/25/2015 2044   APPEARANCEUR CLEAR 05/25/2015 2044   LABSPEC <1.005 (L) 05/25/2015 2044   PHURINE 6.0 05/25/2015 2044   GLUCOSEU NEGATIVE 05/25/2015 2044   HGBUR NEGATIVE 05/25/2015 2044   BILIRUBINUR NEGATIVE 05/25/2015 2044   KETONESUR NEGATIVE 05/25/2015 2044   PROTEINUR NEGATIVE 05/25/2015 2044   UROBILINOGEN 1.0 08/03/2014 0745   NITRITE NEGATIVE 05/25/2015 2044   LEUKOCYTESUR NEGATIVE 05/25/2015 2044   Sepsis Labs Recent Labs  Lab 04/29/22 2005 04/30/22 0456 05/01/22 0209  WBC 5.8 5.4 4.9   Microbiology No results found for this or any previous visit (from the past 240 hour(s)).   Time coordinating discharge: 32 minutes  SIGNED:   [name], MD  [facility] 05/01/2022, 11:16 AM

## 2022-05-01 NOTE — Progress Notes (Signed)
Physical Therapy Treatment Patient Details Name: Annette Sharp MRN: [id number] DOB: 1940/07/27 Today's Date: 05/01/2022   History of Present Illness Pt is 82 year old presented to [facility] on  04/29/22 from [facility] with confusion and chest pain. Pt with NSTEMI. PMH - dementia, CAD, htn, OSA, CVA, bil TKR, bil shoulder replacement    PT Comments    Pt greeted supine in bed and agreeable to session with continued progress towards acute goals. Pt grossly min guard throughout mobility with AD (4 wheeled walker), however pt requiring up to min assist to maintain balance during gait with no AD as pt with increased alteral postural sway and x3 episodes of scissoring steps needing min assist to correct. Pt endorsing increased feelings of stability and increased gait speed with 4 wheeled walker. Pt continues to benefit from skilled PT services to progress toward functional mobility goals.    Recommendations for follow up therapy are one component of a multi-disciplinary discharge planning process, led by the attending physician.  Recommendations may be updated based on patient status, additional functional criteria and insurance authorization.  Follow Up Recommendations  No PT follow up     Assistance Recommended at Discharge Intermittent Supervision/Assistance  Patient can return home with the following Help with stairs or ramp for entrance;Direct supervision/assist for medications management;Assist for transportation;Assistance with cooking/housework;Direct supervision/assist for financial management   Equipment Recommendations  Rollator (4 wheels)    Recommendations for Other Services       Precautions / Restrictions Precautions Precautions: Fall Restrictions Weight Bearing Restrictions: No     Mobility  Bed Mobility Overal bed mobility: Modified Independent                  Transfers Overall transfer level: Needs assistance Equipment used: Rollator (4 wheels),  None Transfers: Sit to/from Stand Sit to Stand: Supervision           General transfer comment: supervision for safety and lines    Ambulation/Gait Ambulation/Gait assistance: Min guard, Min assist Gait Distance (Feet): 200 Feet (x2) Assistive device: Rollator (4 wheels), None Gait Pattern/deviations: Step-through pattern, Decreased stride length Gait velocity: decr     General Gait Details: Assist for safety. increased assist without AD, improved stability and gait speed with 4 wheeled RW with pt endorsing same   Stairs             Wheelchair Mobility    Modified Rankin (Stroke Patients Only)       Balance Overall balance assessment: Needs assistance, History of Falls Sitting-balance support: No upper extremity supported, Feet supported Sitting balance-Leahy Scale: Good     Standing balance support: No upper extremity supported, During functional activity Standing balance-Leahy Scale: Fair                              Cognition Arousal/Alertness: Awake/alert Behavior During Therapy: WFL for tasks assessed/performed Overall Cognitive Status: History of cognitive impairments - at baseline                                 General Comments: appropriate throughout session        Exercises      General Comments General comments (skin integrity, edema, etc.): VSS on RA, pt spouse present and supportive throughout session      Pertinent Vitals/Pain Pain Assessment Pain Assessment: Faces Faces Pain Scale: Hurts little more Pain  Location: head Pain Descriptors / Indicators: Aching Pain Intervention(s): Monitored during session, Limited activity within patient's tolerance    Home Living                          Prior Function            PT Goals (current goals can now be found in the care plan section) Acute Rehab PT Goals Patient Stated Goal: go home PT Goal Formulation: With patient Time For Goal  Achievement: 05/07/22 Progress towards PT goals: Progressing toward goals    Frequency    Min 3X/week      PT Plan      Co-evaluation              AM-PAC PT "6 Clicks" Mobility   Outcome Measure  Help needed turning from your back to your side while in a flat bed without using bedrails?: None Help needed moving from lying on your back to sitting on the side of a flat bed without using bedrails?: None Help needed moving to and from a bed to a chair (including a wheelchair)?: A Little Help needed standing up from a chair using your arms (e.g., wheelchair or bedside chair)?: A Little Help needed to walk in hospital room?: A Little Help needed climbing 3-5 steps with a railing? : A Little 6 Click Score: 20    End of Session Equipment Utilized During Treatment: Gait belt Activity Tolerance: Patient tolerated treatment well Patient left: in bed;with call bell/phone within reach;with family/visitor present Nurse Communication: Mobility status PT Visit Diagnosis: Unsteadiness on feet (R26.81);Muscle weakness (generalized) (M62.81)     Time: 6761-9509 PT Time Calculation (min) (ACUTE ONLY): 21 min  Charges:  $Gait Training: 8-22 mins                     Talajah Slimp R. PTA Acute Rehabilitation Services Office: [facility] 05/01/2022, 11:26 AM

## 2022-05-01 NOTE — Progress Notes (Signed)
Rounding Note    Patient Name: Annette Sharp Date of Encounter: 05/01/2022  [place] Cardiologist: Carlyle Dolly, MD   Subjective   No acute overnight events. Patient is doing well this morning with no complaints. No chest pain or shortness of breath. She is eager to go home.  Inpatient Medications    Scheduled Meds:  acyclovir  400 mg Oral q AM   amLODipine  10 mg Oral q morning   aspirin EC  81 mg Oral q AM   buPROPion  150 mg Oral BID   citalopram  20 mg Oral q morning   clopidogrel  75 mg Oral q morning   donepezil  5 mg Oral q morning   hydrALAZINE  50 mg Oral BID   isosorbide mononitrate  120 mg Oral Daily   levothyroxine  75 mcg Oral Daily   rosuvastatin  20 mg Oral Daily   thiamine  100 mg Oral Daily   Continuous Infusions:  heparin 900 Units/hr (05/01/22 0643)   PRN Meds: acetaminophen, albuterol, nitroGLYCERIN, ondansetron (ZOFRAN) IV   Vital Signs    Vitals:   04/30/22 2006 05/01/22 0020 05/01/22 0454 05/01/22 0800  BP: (!) 146/54 (!) 159/56 (!) 140/54 (!) 145/63  Pulse: (!) 58 (!) 58 (!) 54 64  Resp: '17 15 15 18  '$ Temp: 98.4 F (36.9 C) 97.7 F (36.5 C) 98 F (36.7 C) 98 F (36.7 C)  TempSrc: Oral Oral Oral Oral  SpO2: 97% 97% 97%   Weight:      Height:        Intake/Output Summary (Last 24 hours) at 05/01/2022 0841 Last data filed at 05/01/2022 0643 Gross per 24 hour  Intake 346 ml  Output 875 ml  Net -529 ml      04/29/2022    6:42 PM 03/12/2022    1:48 PM 02/03/2022    8:37 AM  Last 3 Weights  Weight (lbs) 168 lb 11.2 oz 146 lb 147 lb  Weight (kg) 76.522 kg 66.225 kg 66.679 kg      Telemetry    Normal sinus rhythm with rates int he 50s to 60s. - Personally Reviewed  ECG    Sinus bradycardia, rate 54 bpm, with no acute ST/T changes.  - Personally Reviewed  Physical Exam   GEN: Elderly Caucasian female resting comfortably in no acute distress.   Neck: No JVD. Cardiac: RRR. No murmurs, rubs, or gallops.   Respiratory: Clear to auscultation bilaterally. No wheezes, rhonchi, or rales. GI: Soft, non-distended, and non-tender.  MS: No to trace lower extremity edema. No deformity. Skin: Warm and dry. Neuro:  No focal deficits. Underlying dementia. Psych: Normal affect.  Labs    High Sensitivity Troponin:   Recent Labs  Lab 04/29/22 2005 04/29/22 2219  TROPONINIHS 207* 212*     Chemistry Recent Labs  Lab 04/29/22 2005  NA 136  K 3.7  CL 104  CO2 24  GLUCOSE 98  BUN 10  CREATININE 1.16*  CALCIUM 8.7*  PROT 6.3*  ALBUMIN 3.6  AST 51*  ALT 55*  ALKPHOS 48  BILITOT 0.5  GFRNONAA 48*  ANIONGAP 8    Lipids No results for input(s): "CHOL", "TRIG", "HDL", "LABVLDL", "LDLCALC", "CHOLHDL" in the last 168 hours.  Hematology Recent Labs  Lab 04/29/22 2005 04/30/22 0456 05/01/22 0209  WBC 5.8 5.4 4.9  RBC 3.96 3.84* 3.90  HGB 12.6 12.4 12.3  HCT 36.6 35.3* 35.9*  MCV 92.4 91.9 92.1  MCH 31.8 32.3  31.5  MCHC 34.4 35.1 34.3  RDW 13.0 13.1 13.0  PLT 167 158 164   Thyroid  Recent Labs  Lab 04/29/22 2005  TSH 3.807    BNPNo results for input(s): "BNP", "PROBNP" in the last 168 hours.  DDimer No results for input(s): "DDIMER" in the last 168 hours.   Radiology    ECHOCARDIOGRAM COMPLETE  Result Date: 04/30/2022    ECHOCARDIOGRAM REPORT   Patient Name:   Annette Sharp Date of Exam: 04/30/2022 Medical Rec #:  [id number]     Height:       62.0 in Accession #:    [id number]    Weight:       168.7 lb Date of Birth:  [birth year]      BSA:          1.778 m Patient Age:    82 years      BP:           165/58 mmHg Patient Gender: F             HR:           54 bpm. Exam Location:  Inpatient Procedure: 2D Echo, Cardiac Doppler, Color Doppler and 3D Echo Indications:    Chest Pain R07.9  History:        Patient has prior history of Echocardiogram examinations, most                 recent 01/05/2022. NSTEMI and CAD, Signs/Symptoms:Dyspnea and                 Chest Pain; Risk  Factors:Hypertension, Dyslipidemia, Non-Smoker                 and Sleep Apnea.  Sonographer:    Greer Pickerel Referring Phys: [id number] [place] [facility]  Sonographer Comments: Image acquisition challenging due to patient body habitus, Image acquisition challenging due to COPD and Image acquisition challenging due to respiratory motion. Global longitudinal strain was attempted. IMPRESSIONS  1. Left ventricular ejection fraction, by estimation, is 65 to 70%. The left ventricle has normal function. The left ventricle has no regional wall motion abnormalities. Left ventricular diastolic parameters are consistent with Grade II diastolic dysfunction (pseudonormalization).  2. Right ventricular systolic function is normal. The right ventricular size is normal. There is normal pulmonary artery systolic pressure.  3. Left atrial size was moderately dilated.  4. The mitral valve is degenerative. Trivial mitral valve regurgitation. No evidence of mitral stenosis.  5. The aortic valve was not well visualized. Aortic valve regurgitation is mild. Mild aortic valve stenosis.  6. The inferior vena cava is normal in size with greater than 50% respiratory variability, suggesting right atrial pressure of 3 mmHg. FINDINGS  Left Ventricle: Left ventricular ejection fraction, by estimation, is 65 to 70%. The left ventricle has normal function. The left ventricle has no regional wall motion abnormalities. The left ventricular internal cavity size was normal in size. There is  no left ventricular hypertrophy. Left ventricular diastolic parameters are consistent with Grade II diastolic dysfunction (pseudonormalization). Right Ventricle: The right ventricular size is normal. No increase in right ventricular wall thickness. Right ventricular systolic function is normal. There is normal pulmonary artery systolic pressure. The tricuspid regurgitant velocity is 2.41 m/s, and  with an assumed right atrial pressure of 3 mmHg, the estimated right  ventricular systolic pressure is 58.0 mmHg. Left Atrium: Left atrial size was moderately dilated. Right Atrium: Right atrial size was normal in size. Pericardium:  There is no evidence of pericardial effusion. Mitral Valve: The mitral valve is degenerative in appearance. Trivial mitral valve regurgitation. No evidence of mitral valve stenosis. Tricuspid Valve: The tricuspid valve is normal in structure. Tricuspid valve regurgitation is not demonstrated. No evidence of tricuspid stenosis. Aortic Valve: The aortic valve was not well visualized. Aortic valve regurgitation is mild. Aortic regurgitation PHT measures 923 msec. Mild aortic stenosis is present. Aortic valve mean gradient measures 9.0 mmHg. Aortic valve peak gradient measures 15.5 mmHg. Aortic valve area, by VTI measures 1.84 cm. Pulmonic Valve: The pulmonic valve was normal in structure. Pulmonic valve regurgitation is not visualized. No evidence of pulmonic stenosis. Aorta: The aortic root is normal in size and structure. Venous: The inferior vena cava is normal in size with greater than 50% respiratory variability, suggesting right atrial pressure of 3 mmHg. IAS/Shunts: No atrial level shunt detected by color flow Doppler.  LEFT VENTRICLE PLAX 2D LVIDd:         3.60 cm   Diastology LVIDs:         2.00 cm   LV e' medial:    5.87 cm/s LV PW:         1.10 cm   LV E/e' medial:  23.7 LV IVS:        0.90 cm   LV e' lateral:   7.72 cm/s LVOT diam:     1.80 cm   LV E/e' lateral: 18.0 LV SV:         75 LV SV Index:   42 LVOT Area:     2.54 cm                           3D Volume EF:                          3D EF:        56 %                          LV EDV:       100 ml                          LV ESV:       44 ml                          LV SV:        56 ml RIGHT VENTRICLE RV S prime:     15.80 cm/s TAPSE (M-mode): 2.7 cm LEFT ATRIUM             Index        RIGHT ATRIUM           Index LA diam:        4.00 cm 2.25 cm/m   RA Area:     18.40 cm LA Vol (A2C):    49.4 ml 27.78 ml/m  RA Volume:   50.80 ml  28.57 ml/m LA Vol (A4C):   61.2 ml 34.41 ml/m LA Biplane Vol: 56.0 ml 31.49 ml/m  AORTIC VALVE AV Area (Vmax):    1.69 cm AV Area (Vmean):   1.68 cm AV Area (VTI):     1.84 cm AV Vmax:           197.00 cm/s AV Vmean:  138.500 cm/s AV VTI:            0.409 m AV Peak Grad:      15.5 mmHg AV Mean Grad:      9.0 mmHg LVOT Vmax:         131.00 cm/s LVOT Vmean:        91.500 cm/s LVOT VTI:          0.295 m LVOT/AV VTI ratio: 0.72 AI PHT:            923 msec  AORTA Ao Root diam: 2.70 cm Ao Asc diam:  3.10 cm MITRAL VALVE                TRICUSPID VALVE MV Area (PHT): 2.55 cm     TR Peak grad:   23.2 mmHg MV Decel Time: 297 msec     TR Vmax:        241.00 cm/s MV E velocity: 139.00 cm/s MV A velocity: 78.80 cm/s   SHUNTS MV E/A ratio:  1.76         Systemic VTI:  0.30 m                             Systemic Diam: 1.80 cm Glori Bickers MD Electronically signed by Glori Bickers MD Signature Date/Time: 04/30/2022/7:05:36 PM    Final    MR BRAIN WO CONTRAST  Result Date: 04/29/2022 CLINICAL DATA:  Altered mental status EXAM: MRI HEAD WITHOUT CONTRAST TECHNIQUE: Multiplanar, multiecho pulse sequences of the brain and surrounding structures were obtained without intravenous contrast. COMPARISON:  11/17/2021 FINDINGS: Brain: No acute infarct, mass effect or extra-axial collection. No chronic microhemorrhage or siderosis. There is multifocal hyperintense T2-weighted signal within the white matter. Parenchymal volume and CSF spaces are normal. 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 The midline structures are normal. Vascular: Normal flow voids. Skull and upper cervical spine: Normal marrow signal. Sinuses/Orbits: Right sphenoid sinus mucosal thickening. Bilateral ocular lens replacements. Other: None IMPRESSION: 1. No acute intracranial abnormality. 2. 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 3. Findings of  chronic small vessel ischemia. Electronically Signed   By: Ulyses Jarred M.D.   On: 04/29/2022 23:58    Cardiac Studies   Echocardiogram 04/29/2022: Impressions: 1. Left ventricular ejection fraction, by estimation, is 65 to 70%. The  left ventricle has normal function. The left ventricle has no regional  wall motion abnormalities. Left ventricular diastolic parameters are  consistent with Grade II diastolic  dysfunction (pseudonormalization).   2. Right ventricular systolic function is normal. The right ventricular  size is normal. There is normal pulmonary artery systolic pressure.   3. Left atrial size was moderately dilated.   4. The mitral valve is degenerative. Trivial mitral valve regurgitation.  No evidence of mitral stenosis.   5. The aortic valve was not well visualized. Aortic valve regurgitation  is mild. Mild aortic valve stenosis.   6. The inferior vena cava is normal in size with greater than 50%  respiratory variability, suggesting right atrial pressure of 3 mmHg.    Patient Profile     82 y.o. female with a history of CAD s/p DES to RCA in 2007, hypertension, hyperlipidemia, hypothyroidism, prior CVA, COPD,untreated obstructive sleep apnea (unable to tolerate CPAP), GERD, and severe dementia who presented to [facility] on 04/28/2022 for altered mental status and was found to have elevated troponin. She was transferred to  [facility] for [facility] evaluation.  Assessment & Plan    NSTEMI CAD History of CAD s/p DES to RCA in 2007. Last LHC in 2020 showed patent RCA disease with only 30% proximal narrowing and otherwise only mild disease. She was felt to have INOCA at that time and treated medically. Myoview in 01/2020 was low risk with no evidence of ischemia. Presented with altered mental status and was found to have elevated troponin. High-sensitivity troponin peaked at 579 on labs at [facility]. EKG shows no acute ischemic changes. Echo shows LVEF of 65-70% with  normal wall motion and grade 2 diastolic dysfunction. - Patient denies any chest pain throughout this but history is limited due to severe dementia. - Decision was made to treat medically given severe dementia. She has completed > 48 hour of IV Heparin (initially started at [facility]) so this can now be stopped. Continue DAPT with Aspirin and Plavix. No beta-blocker due to baseline bradycardia. Continue Amlodipine '10mg'$  daily, Imdur '120mg'$  daily, and Crestor '20mg'$  daily.  Hypertension BP still mildly elevated with systolic BP in the 607P to 150s. Most recent BP 145/63 but this was before any morning medications were given. - Continue current medications: Amlodipine '10mg'$  daily, Imdur '120mg'$  daily, and Hydralazine '50mg'$  twice daily. Hydralazine was just increased yesterday so we can give this a little more time. Can continue to adjust medications as an outpatient.  Hyperlipidemia LDL 66 in 12/2021. - Continue Crestor '20mg'$  daily.  Otherwise, per primary team: - Altered mental status/ acute encephalopathy - Hypothyroidism - Dementia  Disposition: Patient is stable from a cardiac standpoint and think she can likely go home from out point of view. However, MD to follow with any additional recommendations. Will arrange follow-up visit.    For questions or updates, please contact [place] Please consult [email or web address] for contact info under        Signed, Darreld Mclean, PA-C  05/01/2022, 8:41 AM

## 2022-05-01 NOTE — TOC Transition Note (Addendum)
Transition of Care [facility]) - CM/SW Discharge Note   Patient Details  Name: Annette Sharp MRN: [id number] Date of Birth: [birth year]  [facility] CM/SW Contact:  Verdell Carmine, RN Phone Number: 05/01/2022, 10:35 AM   Clinical Narrative:     Patient to discharge today, converting off heparin and on ASA and plavix. 105 PT saw patient and recommended rollator Spoke to husband via phone. He wishes to have a walker with seat. Since they are discharging shortly, he states to send to house. Sent to [employer] with instructions to deliver to home. TOC will sign off  Final next level of care: Home/Self Care Barriers to Discharge: No Barriers Identified   Patient Goals and CMS Choice      Discharge Placement                         Discharge Plan and Services Additional resources added to the After Visit Summary for                                       Social Determinants of Health (SDOH) Interventions SDOH Screenings   Food Insecurity: No Food Insecurity (01/21/2022)  Housing: Low Risk  (01/21/2022)  Transportation Needs: No Transportation Needs (01/21/2022)  Utilities: Not At Risk (01/21/2022)  Tobacco Use: Low Risk  (03/12/2022)     Readmission Risk Interventions     No data to display

## 2022-05-04 DIAGNOSIS — R1310 Dysphagia, unspecified: Secondary | ICD-10-CM | POA: Diagnosis not present

## 2022-05-04 DIAGNOSIS — F0150 Vascular dementia without behavioral disturbance: Secondary | ICD-10-CM | POA: Diagnosis not present

## 2022-05-04 DIAGNOSIS — N183 Chronic kidney disease, stage 3 unspecified: Secondary | ICD-10-CM | POA: Diagnosis not present

## 2022-05-04 DIAGNOSIS — K219 Gastro-esophageal reflux disease without esophagitis: Secondary | ICD-10-CM | POA: Diagnosis not present

## 2022-05-04 DIAGNOSIS — E039 Hypothyroidism, unspecified: Secondary | ICD-10-CM | POA: Diagnosis not present

## 2022-05-04 DIAGNOSIS — E7801 Familial hypercholesterolemia: Secondary | ICD-10-CM | POA: Diagnosis not present

## 2022-05-04 DIAGNOSIS — G9340 Encephalopathy, unspecified: Secondary | ICD-10-CM | POA: Diagnosis not present

## 2022-05-04 DIAGNOSIS — R5383 Other fatigue: Secondary | ICD-10-CM | POA: Diagnosis not present

## 2022-05-08 NOTE — Telephone Encounter (Signed)
With recent hospitalization is she still cleared?

## 2022-05-08 NOTE — Telephone Encounter (Signed)
She has a follow-up scheduled on 05/11/2022 and clearance will be revisited at that time.  We will resend clearance recommendations at that time.  Ambrose Pancoast, NP

## 2022-05-09 ENCOUNTER — Other Ambulatory Visit: Payer: Self-pay

## 2022-05-09 ENCOUNTER — Emergency Department (HOSPITAL_COMMUNITY)
Admission: EM | Admit: 2022-05-09 | Discharge: 2022-05-09 | Disposition: A | Discharge status: Home / Self Care | Payer: Medicare Other | Attending: Emergency Medicine | Admitting: Emergency Medicine

## 2022-05-09 ENCOUNTER — Encounter (HOSPITAL_COMMUNITY): Payer: Self-pay

## 2022-05-09 DIAGNOSIS — Z7901 Long term (current) use of anticoagulants: Secondary | ICD-10-CM | POA: Insufficient documentation

## 2022-05-09 DIAGNOSIS — R112 Nausea with vomiting, unspecified: Secondary | ICD-10-CM | POA: Insufficient documentation

## 2022-05-09 DIAGNOSIS — Z7982 Long term (current) use of aspirin: Secondary | ICD-10-CM | POA: Diagnosis not present

## 2022-05-09 DIAGNOSIS — Z79899 Other long term (current) drug therapy: Secondary | ICD-10-CM | POA: Insufficient documentation

## 2022-05-09 DIAGNOSIS — F0390 Unspecified dementia without behavioral disturbance: Secondary | ICD-10-CM | POA: Diagnosis not present

## 2022-05-09 DIAGNOSIS — I2510 Atherosclerotic heart disease of native coronary artery without angina pectoris: Secondary | ICD-10-CM | POA: Diagnosis not present

## 2022-05-09 DIAGNOSIS — I10 Essential (primary) hypertension: Secondary | ICD-10-CM | POA: Diagnosis not present

## 2022-05-09 DIAGNOSIS — R197 Diarrhea, unspecified: Secondary | ICD-10-CM | POA: Diagnosis not present

## 2022-05-09 DIAGNOSIS — R110 Nausea: Secondary | ICD-10-CM | POA: Diagnosis not present

## 2022-05-09 DIAGNOSIS — R1111 Vomiting without nausea: Secondary | ICD-10-CM | POA: Diagnosis not present

## 2022-05-09 DIAGNOSIS — R531 Weakness: Secondary | ICD-10-CM | POA: Diagnosis not present

## 2022-05-09 LAB — COMPREHENSIVE METABOLIC PANEL
ALT: 58 U/L — ABNORMAL HIGH (ref 0–44)
AST: 54 U/L — ABNORMAL HIGH (ref 15–41)
Albumin: 3.8 g/dL (ref 3.5–5.0)
Alkaline Phosphatase: 54 U/L (ref 38–126)
Anion gap: 9 (ref 5–15)
BUN: 17 mg/dL (ref 8–23)
CO2: 20 mmol/L — ABNORMAL LOW (ref 22–32)
Calcium: 8.6 mg/dL — ABNORMAL LOW (ref 8.9–10.3)
Chloride: 108 mmol/L (ref 98–111)
Creatinine, Ser: 1.24 mg/dL — ABNORMAL HIGH (ref 0.44–1.00)
GFR, Estimated: 44 mL/min — ABNORMAL LOW (ref >60)
Glucose, Bld: 94 mg/dL (ref 70–99)
Potassium: 3.8 mmol/L (ref 3.5–5.1)
Sodium: 137 mmol/L (ref 135–145)
Total Bilirubin: 0.4 mg/dL (ref 0.3–1.2)
Total Protein: 6.7 g/dL (ref 6.5–8.1)

## 2022-05-09 LAB — CBC
HCT: 37.2 % (ref 36.0–46.0)
Hemoglobin: 12.9 g/dL (ref 12.0–15.0)
MCH: 32 pg (ref 26.0–34.0)
MCHC: 34.7 g/dL (ref 30.0–36.0)
MCV: 92.3 fL (ref 80.0–100.0)
Platelets: 222 10*3/uL (ref 150–400)
RBC: 4.03 MIL/uL (ref 3.87–5.11)
RDW: 13.6 % (ref 11.5–15.5)
WBC: 8.4 10*3/uL (ref 4.0–10.5)
nRBC: 0 % (ref 0.0–0.2)

## 2022-05-09 LAB — TROPONIN I (HIGH SENSITIVITY): Troponin I (High Sensitivity): 11 ng/L (ref <18)

## 2022-05-09 MED ORDER — ONDANSETRON 4 MG PO TBDP: 4.0000 mg | ORAL_TABLET | Freq: Three times a day (TID) | ORAL | 0 refills | Status: AC | PRN

## 2022-05-09 MED ORDER — SODIUM CHLORIDE 0.9 % IV SOLN: INTRAVENOUS | Status: DC

## 2022-05-09 MED ORDER — ONDANSETRON HCL 4 MG/2ML IJ SOLN
4.0000 mg | Freq: Once | INTRAMUSCULAR | Status: AC
  Administered 2022-05-09: 4 mg via INTRAVENOUS
  Filled 2022-05-09: qty 2

## 2022-05-09 MED ORDER — ONDANSETRON 4 MG PO TBDP
4.0000 mg | ORAL_TABLET | Freq: Once | ORAL | Status: AC
  Administered 2022-05-09: 4 mg via ORAL
  Filled 2022-05-09: qty 1

## 2022-05-09 NOTE — ED Provider Notes (Signed)
[facility] Provider Note   CSN: [id number] Arrival date & time: 05/09/22  1601     History  Chief Complaint  Patient presents with   Diarrhea    Annette Sharp is a 82 y.o. female.   Diarrhea  This patient is a 82 year old female, she has a history of dementia and is not very clear with her history, she does have high cholesterol and coronary artery disease as well as hypertension.  She actually was admitted to the hospital on January 31 and stayed until February 2, during that time she was admitted with a non-ST elevation myocardial infarction.  She was thought to have some acute metabolic encephalopathy but it had recent investigations with EEG and MRI which were unremarkable, echocardiogram performed on February 1 showed an ejection fraction of 65 to 70% with no other acute findings except for grade 2 diastolic dysfunction.  Cardiology saw the patient in the hospital and recommended supportive care, blood pressure control, avoiding aggressive interventions.  The patient reports and confirmed by paramedics that she started to have abdominal discomfort while she was sitting on the toilet, has had multiple episodes of vomiting and diarrhea over the last couple of hours.  Received Zofran by paramedics and states she is currently feeling better.  No blood in the stool, just watery diarrhea    Home Medications Prior to Admission medications   Medication Sig Start Date End Date Taking? Authorizing Provider  acyclovir (ZOVIRAX) 400 MG tablet Take 400 mg by mouth in the morning.   Yes [provider]  albuterol (VENTOLIN HFA) 108 (90 Base) MCG/ACT inhaler Inhale 2 puffs into the lungs every 6 (six) hours as needed for wheezing or shortness of breath. 03/12/22  Yes BranchAlphonse Guild, MD  amLODipine (NORVASC) 10 MG tablet Take 10 mg by mouth every morning. 05/04/15  Yes [provider]  aspirin EC 81 MG tablet Take 81 mg by mouth  in the morning. 04/03/18  Yes Rehman, Mechele Dawley, MD  buPROPion (WELLBUTRIN SR) 150 MG 12 hr tablet Take 150 mg by mouth 2 (two) times daily. 01/20/22  Yes [provider]  citalopram (CELEXA) 20 MG tablet Take 20 mg by mouth every morning.   Yes [provider]  clopidogrel (PLAVIX) 75 MG tablet Take 1 tablet (75 mg total) by mouth every morning. 06/19/21  Yes Rehman, Mechele Dawley, MD  donepezil (ARICEPT) 10 MG tablet Take 10 mg by mouth daily. 05/07/22  Yes [provider]  hydrALAZINE (APRESOLINE) 50 MG tablet Take 1 tablet (50 mg total) by mouth 2 (two) times daily. 05/01/22 05/31/22 Yes [name], MD  isosorbide mononitrate (IMDUR) 120 MG 24 hr tablet Take 1 tablet (120 mg total) by mouth in the morning and at bedtime. 03/12/22  Yes BranchAlphonse Guild, MD  lamoTRIgine (LAMICTAL) 25 MG tablet Take 25 mg by mouth 2 (two) times daily.   Yes [provider]  levothyroxine (SYNTHROID) 75 MCG tablet Take 75 mcg by mouth daily. 12/03/20  Yes [provider]  nitroGLYCERIN (NITROSTAT) 0.4 MG SL tablet Place 1 tablet (0.4 mg total) under the tongue every 5 (five) minutes x 3 doses as needed for chest pain (if no relief after 3rd dose, proceed to the ED for an evaluation). Patient taking differently: Place 0.4 mg under the tongue every 5 (five) minutes x 3 doses as needed for chest pain. 02/08/18 06/14/23 Yes Herminio Commons, MD  ondansetron (ZOFRAN-ODT) 4 MG disintegrating  tablet Take 1 tablet (4 mg total) by mouth every 8 (eight) hours as needed for nausea. 05/09/22  Yes Noemi Chapel, MD  QUEtiapine (SEROQUEL) 25 MG tablet Take 25 mg by mouth at bedtime. 05/04/22  Yes [provider]  rosuvastatin (CRESTOR) 20 MG tablet TAKE 1 TABLET BY MOUTH EVERY DAY Patient taking differently: Take 20 mg by mouth daily. 04/28/21  Yes BranchAlphonse Guild, MD  thiamine (VITAMIN B-1) 100 MG tablet Take 100 mg by mouth daily.   Yes [provider]  Thiamine HCl (VITAMIN  B-1 PO) Take 1 tablet by mouth daily.   Yes [provider]  donepezil (ARICEPT) 5 MG tablet Take 5 mg by mouth every morning. Patient not taking: Reported on 05/09/2022 01/15/22   [provider]  hyoscyamine (LEVBID) 0.375 MG 12 hr tablet TAKE 1 TABLET (0.375 MG TOTAL) BY MOUTH DAILY. Patient not taking: Reported on 05/09/2022 07/21/21   Rogene Houston, MD      Allergies    Ace inhibitors, Gemfibrozil, Moxifloxacin, and Ranexa [ranolazine]    Review of Systems   Review of Systems  Gastrointestinal:  Positive for diarrhea.  All other systems reviewed and are negative.   Physical Exam Updated Vital Signs BP (!) 145/53   Pulse 62   Temp 98 F (36.7 C) (Oral)   Resp 18   SpO2 99%  Physical Exam Vitals and nursing note reviewed.  Constitutional:      General: She is not in acute distress.    Appearance: She is well-developed.  HENT:     Head: Normocephalic and atraumatic.     Mouth/Throat:     Pharynx: No oropharyngeal exudate.  Eyes:     General: No scleral icterus.       Right eye: No discharge.        Left eye: No discharge.     Conjunctiva/sclera: Conjunctivae normal.     Pupils: Pupils are equal, round, and reactive to light.  Neck:     Thyroid: No thyromegaly.     Vascular: No JVD.  Cardiovascular:     Rate and Rhythm: Normal rate and regular rhythm.     Heart sounds: Normal heart sounds. No murmur heard.    No friction rub. No gallop.  Pulmonary:     Effort: Pulmonary effort is normal. No respiratory distress.     Breath sounds: Normal breath sounds. No wheezing or rales.  Abdominal:     General: Bowel sounds are normal. There is no distension.     Palpations: Abdomen is soft. There is no mass.     Tenderness: There is abdominal tenderness.     Comments: Minimal periumbilical and suprapubic discomfort, no guarding, very soft, normal bowel sounds  Musculoskeletal:        General: No tenderness. Normal range of motion.     Cervical back:  Normal range of motion and neck supple.  Lymphadenopathy:     Cervical: No cervical adenopathy.  Skin:    General: Skin is warm and dry.     Findings: No erythema or rash.  Neurological:     Mental Status: She is alert.     Coordination: Coordination normal.  Psychiatric:        Behavior: Behavior normal.     ED Results / Procedures / Treatments   Labs (all labs ordered are listed, but only abnormal results are displayed) Labs Reviewed  COMPREHENSIVE METABOLIC PANEL - Abnormal; Notable for the following components:  Result Value   CO2 20 (*)    Creatinine, Ser 1.24 (*)    Calcium 8.6 (*)    AST 54 (*)    ALT 58 (*)    GFR, Estimated 44 (*)    All other components within normal limits  CBC  TROPONIN I (HIGH SENSITIVITY)    EKG EKG Interpretation  Date/Time:  Saturday May 09 2022 16:15:45 EST Ventricular Rate:  60 PR Interval:  152 QRS Duration: 85 QT Interval:  466 QTC Calculation: 466 R Axis:   42 Text Interpretation: Sinus rhythm since last tracing no significant change Confirmed by Noemi Chapel [phone]) on 05/09/2022 4:28:39 PM  Radiology No results found.  Procedures Procedures    Medications Ordered in ED Medications  0.9 %  sodium chloride infusion ( Intravenous New Bag/Given 05/09/22 1710)  ondansetron (ZOFRAN) injection 4 mg (4 mg Intravenous Given 05/09/22 1709)  ondansetron (ZOFRAN-ODT) disintegrating tablet 4 mg (4 mg Oral Given 05/09/22 1834)    ED Course/ Medical Decision Making/ A&P                             Medical Decision Making Amount and/or Complexity of Data Reviewed Labs: ordered.  Risk Prescription drug management.   This patient presents to the ED for concern of abdominal discomfort with vomiting and diarrhea, this involves an extensive number of treatment options, and is a complaint that carries with it a high risk of complications and morbidity.  The differential diagnosis includes infectious, could be related to a  viral process, less likely to be something pathological, no blood in the stool and an unremarkable exam   Co morbidities that complicate the patient evaluation  Recent non-ST elevation myocardial infarction, blood pressure control   Additional history obtained:  Additional history obtained from electronic medical record External records from outside source obtained and reviewed including cardiology notes and hospitalization and echocardiogram, see above   Lab Tests:  I Ordered, and personally interpreted labs.  The pertinent results include: Normal blood counts, no leukocytosis or anemia, renal function is at baseline, electrolytes are normal, troponin is 11 which is significantly better than it was 2 weeks ago at 212.  As the patient had no abdominal discomfort or tenderness we elected not to do a CT scan or imaging of the belly  Cardiac Monitoring: / EKG:  The patient was maintained on a cardiac monitor.  I personally viewed and interpreted the cardiac monitored which showed an underlying rhythm of: Normal sinus rhythm    Problem List / ED Course / Critical interventions / Medication management  The patient did well, she still had 1 episode of vomiting in the ER over multiple hours of observation but felt better after getting IV fluids.  She also had another episode of diarrhea.  I suspect that she has some type of gastroenteritis but appears very hemodynamically stable.  Discharge vital signs include a blood pressure of 145/49 and a pulse of 62.  She is afebrile, normal oxygen at 94% and no respiratory symptoms.  I spoke with the husband and the patient about her care and they are both in agreement with the plan to go home with Zofran and follow-up closely I have reviewed the patients home medicines and have made adjustments as needed   Social Determinants of Health:  Elderly   Test / Admission - Considered:  Considered admission but the patient improved significantly and  was hemodynamically stable  Final Clinical Impression(s) / ED Diagnoses Final diagnoses:  None    Rx / DC Orders ED Discharge Orders          Ordered    ondansetron (ZOFRAN-ODT) 4 MG disintegrating tablet  Every 8 hours PRN        05/09/22 2240              Noemi Chapel, MD 05/09/22 2241

## 2022-05-09 NOTE — ED Triage Notes (Signed)
Patient  presents to ED via [facility], Family reports pt has several episodes of diarrhea on on today started around 1300. Patient reports episode of vomiting today. C/o right flank pain radiating to lower abdomen. EMS started IVF  250 bolus of NS Vital signs stable  Zofran 4 mg given IV.

## 2022-05-09 NOTE — Discharge Instructions (Addendum)
Your testing has been reassuring.  There is no signs of heart attack.  You may take Zofran every 6 hours as needed for nausea but be aware that you may have ongoing nausea and vomiting and diarrhea for a couple of days.  Make sure that you are drinking plenty of liquids to stay hydrated.  Thank you for allowing [place] to treat you in the emergency department today.  After reviewing your examination and potential testing that was done it appears that you are safe to go home.  I would like for you to follow-up with your doctor within the next several days, have them obtain your results and follow-up with them to review all of these tests.  If you should develop severe or worsening symptoms return to the emergency department immediately

## 2022-05-11 ENCOUNTER — Ambulatory Visit: Payer: Medicare Other | Admitting: Nurse Practitioner

## 2022-05-18 DIAGNOSIS — I10 Essential (primary) hypertension: Secondary | ICD-10-CM | POA: Diagnosis not present

## 2022-05-18 DIAGNOSIS — Z0001 Encounter for general adult medical examination with abnormal findings: Secondary | ICD-10-CM | POA: Diagnosis not present

## 2022-05-18 DIAGNOSIS — F0390 Unspecified dementia without behavioral disturbance: Secondary | ICD-10-CM | POA: Diagnosis not present

## 2022-05-18 DIAGNOSIS — E7801 Familial hypercholesterolemia: Secondary | ICD-10-CM | POA: Diagnosis not present

## 2022-05-18 DIAGNOSIS — E039 Hypothyroidism, unspecified: Secondary | ICD-10-CM | POA: Diagnosis not present

## 2022-05-18 DIAGNOSIS — F17210 Nicotine dependence, cigarettes, uncomplicated: Secondary | ICD-10-CM | POA: Diagnosis not present

## 2022-05-19 NOTE — Telephone Encounter (Signed)
She has an appointment with one of our NPs on 2/21 at which time clearance will be addressed.  Emmaline Life, NP-C  05/19/2022, 10:46 AM [address] Office [phone] Fax [phone]

## 2022-05-20 ENCOUNTER — Ambulatory Visit: Payer: Medicare Other | Attending: Nurse Practitioner | Admitting: Nurse Practitioner

## 2022-05-20 ENCOUNTER — Telehealth: Payer: Self-pay | Admitting: Cardiology

## 2022-05-20 ENCOUNTER — Encounter: Payer: Self-pay | Admitting: Nurse Practitioner

## 2022-05-20 VITALS — BP 158/65 | HR 54 | Ht 62.0 in | Wt 147.0 lb

## 2022-05-20 DIAGNOSIS — I2510 Atherosclerotic heart disease of native coronary artery without angina pectoris: Secondary | ICD-10-CM

## 2022-05-20 DIAGNOSIS — I10 Essential (primary) hypertension: Secondary | ICD-10-CM | POA: Diagnosis not present

## 2022-05-20 DIAGNOSIS — Z01810 Encounter for preprocedural cardiovascular examination: Secondary | ICD-10-CM | POA: Diagnosis not present

## 2022-05-20 DIAGNOSIS — Z01818 Encounter for other preprocedural examination: Secondary | ICD-10-CM

## 2022-05-20 DIAGNOSIS — I25118 Atherosclerotic heart disease of native coronary artery with other forms of angina pectoris: Secondary | ICD-10-CM

## 2022-05-20 DIAGNOSIS — E785 Hyperlipidemia, unspecified: Secondary | ICD-10-CM | POA: Diagnosis not present

## 2022-05-20 NOTE — Telephone Encounter (Signed)
Husband called to report QUEtiapine (SEROQUEL) 25 MG tablet is the medication which was not on the patient's list at the time her appointment today.  Husband stated Dr. Conan Bowens originally prescribed this medication.

## 2022-05-20 NOTE — Telephone Encounter (Signed)
Medication list updated with seroquel 25 mg already

## 2022-05-20 NOTE — Progress Notes (Addendum)
Cardiology Office Note:    Date:  05/20/2022  ID:  Annette, Sharp Oct 09, 1940, MRN [id number]  PCP:  Manon Hilding, MD   [facility] Cardiologist:  Carlyle Dolly, MD     Referring MD: Manon Hilding, MD   CC: Here for follow-up  History of Present Illness:    Annette Sharp is a 82 y.o. female with a hx of the following:  CAD Hypertension Hyperlipidemia History of prior stroke OSA (unable to tolerate CPAP) Dementia  Patient is a very pleasant 82 year old female with past medical history as mentioned above.  Previous cardiovascular history includes prior drug-eluting stent RCA 2007, repeat cardiac catheterization in 2020 did not show significant disease.  In 2021 underwent nuclear stress test, negative for ischemia, had short runs of A. tach.  Echocardiogram in 2022 showed EF 65 to 70%, grade 2 DD, normal RV, mild MR, mild AI.  She had an ED visit at [facility] in 2022 with chest pain.  Workup was negative.  PFTs in January 2023 showed mild diffusion defect.  Was admitted in October 2023 with chest pain, troponin trended up to 234.  EKG negative for acute ischemic changes.  Echocardiogram showed normal EF, no wall motion abnormalities, medically managed given mild troponin elevation, comorbidities, and severe dementia.  Last seen by Dr. Carlyle Dolly on March 12, 2022.  Patient reported some recent chest pains, Imdur was increased to 120 mg twice daily, she was not a candidate for cardiac catheterization due to her age, advanced dementia, her recent NSTEMI was medically managed.  No other medication changes were made.  Was told to follow-up in 6 months.  She was admitted to the hospital on January 31 - February 2, was admitted with NSTEMI, was thought to have some acute metabolic encephalopathy.  EEG and MRI were unremarkable.  Echocardiogram showed normal EF, grade 2 DD.   Recently seen at [facility] and patient reported abdominal discomfort,  multiple episodes of vomiting and diarrhea.  Paramedics gave Zofran, patient was feeling better.  EKG showed sinus rhythm.  Troponin had improved since last visit, 11. Received IV fluids and condition improved.  Today she presents for hospital follow-up as well as pending EGD with her husband.  She states she is doing well from a cardiac perspective. Denies any chest pain, shortness of breath, palpitations, syncope, presyncope, dizziness, orthopnea, PND, swelling or significant weight changes, acute bleeding, or claudication.   Past Medical History:  Diagnosis Date   Anxiety    Back pain    Cerebrovascular disease    NONOBSTRUCTIVE CVA BY CAROTID DOPPLERS OCTOBER 2008   Chronic renal insufficiency    GFR 45 ML'S PER MINUTE   COPD (chronic obstructive pulmonary disease) (HCC)    Coronary artery disease    a. s/p DES to RCA in 2007 b. patent stent by cath in 12/2014 with nonobstructive disease along LAD and RCA   Coronary atherosclerosis of native coronary artery    NEGATIVE LEXISCAN FOR ISCHEMIA MARCH 23,2010 NORMAL LV FUNCTTION   Depression    Dyspnea    with exertion   GERD (gastroesophageal reflux disease)    Headache    chronic headache from fall in December   Hyperlipidemia    Hypertension    Hypothyroidism    Other and unspecified angina pectoris    ANGINA   Overweight(278.02)    OBESITY   Peptic ulcer disease    Primary localized osteoarthritis of left knee    Sleep  apnea    SEVERE OBSTRUCTIVE    cpap   Stroke [facility])    affected memory   Thyroid disease    TREATED HYPOTHYROIDISM    Past Surgical History:  Procedure Laterality Date   ABDOMINAL HYSTERECTOMY     BIOPSY  06/18/2021   Procedure: BIOPSY;  Surgeon: Rogene Houston, MD;  [facility];  Service: Endoscopy;;   CARDIAC CATHETERIZATION N/A 01/22/2015   Procedure: Right/Left Heart Cath and Coronary Angiography;  Surgeon: Wellington Hampshire, MD;  Location: [place] [facility];  Service: Cardiovascular;   Laterality: N/A;   CARPAL TUNNEL RELEASE     CHOLECYSTECTOMY     COLONOSCOPY N/A 03/31/2018   Procedure: COLONOSCOPY;  Surgeon: Rogene Houston, MD;  [facility];  Service: Endoscopy;  Laterality: N/A;  2:45   COLONOSCOPY WITH PROPOFOL N/A 06/18/2021   Procedure: COLONOSCOPY WITH PROPOFOL;  Surgeon: Rogene Houston, MD;  [facility];  Service: Endoscopy;  Laterality: N/A;  11:00   JOINT REPLACEMENT     LEFT HEART CATH AND CORONARY ANGIOGRAPHY N/A 04/28/2018   Procedure: LEFT HEART CATH AND CORONARY ANGIOGRAPHY;  Surgeon: Belva Crome, MD;  Location: [place] [facility];  Service: Cardiovascular;  Laterality: N/A;   POLYPECTOMY  03/31/2018   Procedure: POLYPECTOMY;  Surgeon: Rogene Houston, MD;  [facility];  Service: Endoscopy;;  splen. flex, cecum   POLYPECTOMY  06/18/2021   Procedure: POLYPECTOMY;  Surgeon: Rogene Houston, MD;  [facility];  Service: Endoscopy;;   REPLACEMENT TOTAL KNEE Right 07/28/2010   ROTATOR CUFF REPAIR Right 07/08/2009   TOTAL ABDOMINAL HYSTERECTOMY W/ BILATERAL SALPINGOOPHORECTOMY     TOTAL KNEE ARTHROPLASTY Left 04/08/2015   Procedure: LEFT TOTAL KNEE ARTHROPLASTY;  Surgeon: Elsie Saas, MD;  Location: [place];  Service: Orthopedics;  Laterality: Left;   TOTAL SHOULDER ARTHROPLASTY Left 06/02/2017   Procedure: TOTAL SHOULDER ARTHROPLASTY;  Surgeon: Hiram Gash, MD;  Location: [place];  Service: Orthopedics;  Laterality: Left;    Current Medications: Current Meds  Medication Sig   acyclovir (ZOVIRAX) 400 MG tablet Take 400 mg by mouth in the morning.   albuterol (VENTOLIN HFA) 108 (90 Base) MCG/ACT inhaler Inhale 2 puffs into the lungs every 6 (six) hours as needed for wheezing or shortness of breath.   amLODipine (NORVASC) 10 MG tablet Take 10 mg by mouth every morning.   aspirin EC 81 MG tablet Take 81 mg by mouth in the morning.   buPROPion (WELLBUTRIN SR) 150 MG 12 hr tablet Take 150 mg by mouth 2 (two) times daily.    cetirizine (ZYRTEC) 10 MG tablet Take 10 mg by mouth as needed for allergies or rhinitis.   citalopram (CELEXA) 20 MG tablet Take 20 mg by mouth every morning.   clopidogrel (PLAVIX) 75 MG tablet Take 1 tablet (75 mg total) by mouth every morning.   donepezil (ARICEPT) 10 MG tablet Take 10 mg by mouth daily.   hydrALAZINE (APRESOLINE) 50 MG tablet Take 1 tablet (50 mg total) by mouth 2 (two) times daily.   hyoscyamine (LEVBID) 0.375 MG 12 hr tablet TAKE 1 TABLET (0.375 MG TOTAL) BY MOUTH DAILY.   isosorbide mononitrate (IMDUR) 120 MG 24 hr tablet Take 1 tablet (120 mg total) by mouth in the morning and at bedtime.   lamoTRIgine 50 MG TBDP Take 25 mg by mouth 2 (two) times daily.   levothyroxine (SYNTHROID) 75 MCG tablet Take 75 mcg by mouth  daily.   ondansetron (ZOFRAN-ODT) 4 MG disintegrating tablet Take 1 tablet (4 mg total) by mouth every 8 (eight) hours as needed for nausea.   QUEtiapine (SEROQUEL) 25 MG tablet Take 25 mg by mouth at bedtime.   rosuvastatin (CRESTOR) 20 MG tablet TAKE 1 TABLET BY MOUTH EVERY DAY (Patient taking differently: Take 20 mg by mouth daily.)   thiamine (VITAMIN B-1) 100 MG tablet Take 100 mg by mouth daily.     Allergies:   Ace inhibitors, Gemfibrozil, Moxifloxacin, and Ranexa [ranolazine]   Social History   Socioeconomic History   Marital status: Married    Spouse name: Not on file   Number of children: Not on file   Years of education: Not on file   Highest education level: Not on file  Occupational History   Not on file  Tobacco Use   Smoking status: Never   Smokeless tobacco: Never  Vaping Use   Vaping Use: Never used  Substance and Sexual Activity   Alcohol use: No    Alcohol/week: 0.0 standard drinks of alcohol   Drug use: No   Sexual activity: Never  Other Topics Concern   Not on file  Social History Narrative   Not on file   Social Determinants of Health   Financial Resource Strain: Not on file  Food Insecurity: No Food Insecurity  (01/21/2022)   Hunger Vital Sign    Worried About Running Out of Food in the Last Year: Never true    Ran Out of Food in the Last Year: Never true  Transportation Needs: No Transportation Needs (01/21/2022)   PRAPARE - Hydrologist (Medical): No    Lack of Transportation (Non-Medical): No  Physical Activity: Not on file  Stress: Not on file  Social Connections: Not on file     Family History: The patient's family history includes CAD in her father, mother, sister, and another family member; Cancer in her mother and another family member; Diabetes in her sister; Heart attack in an other family member; Hypertension in an other family member; Leukemia in her brother.  ROS:   Please see the history of present illness.     All other systems reviewed and are negative.  EKGs/Labs/Other Studies Reviewed:    The following studies were reviewed today:   EKG:  EKG is ordered today.  The ekg ordered today demonstrates sinus bradycardia, 54 bpm, no acute ischemic changes.   Echocardiogram on 04/30/2022: 1. Left ventricular ejection fraction, by estimation, is 65 to 70%. The  left ventricle has normal function. The left ventricle has no regional  wall motion abnormalities. Left ventricular diastolic parameters are  consistent with Grade II diastolic  dysfunction (pseudonormalization).   2. Right ventricular systolic function is normal. The right ventricular  size is normal. There is normal pulmonary artery systolic pressure.   3. Left atrial size was moderately dilated.   4. The mitral valve is degenerative. Trivial mitral valve regurgitation.  No evidence of mitral stenosis.   5. The aortic valve was not well visualized. Aortic valve regurgitation  is mild. Mild aortic valve stenosis.   6. The inferior vena cava is normal in size with greater than 50%  respiratory variability, suggesting right atrial pressure of 3 mmHg.  Echo bubble study limited on  01/05/2022: 1. Cannot rule out very focal wall motion abnormality in the mid  inferoseptum and anteroseptum. Left ventricular ejection fraction, by  estimation, is 60 to 65%. The left ventricle has  normal function. The left  ventricle has no regional wall motion  abnormalities.   2. Right ventricular systolic function is normal. The right ventricular  size is normal.   3. The mitral valve is normal in structure.   4. The aortic valve is tricuspid. Aortic valve sclerosis/calcification is  present, without any evidence of aortic stenosis.   5. The inferior vena cava is normal in size with greater than 50%  respiratory variability, suggesting right atrial pressure of 3 mmHg.   6. Agitated saline contrast bubble study was negative, with no evidence  of any interatrial shunt.  Myoview on 02/07/2020: There was no ST segment deviation noted during stress. Short runs of atrial tachycardia with just mildly elevated rates around 110 The study is normal. There are no perfusion defects This is a low risk study. The left ventricular ejection fraction is hyperdynamic (>65%). Nonspecific finding of elevated TID in absence of perfusion defects  Left heart catheterization on 04/28/2018: Moderate three-vessel coronary disease, with diffuse proximal to mid calcification in all territories. Left main 25% diffuse narrowing Proximal to mid LAD 40 to 50%.  Unchanged from prior angiogram Proximal 30% circumflex.  First OM is small with irregularities. RCA contains a proximal to ostial stent that has 30% proximal narrowing.  Luminal irregularities are noted throughout the mid right coronary.  No significant obstruction is seen. Normal left ventricular function with EF greater than 50%.  LVEDP is normal.   RECOMMENDATIONS:   Symptoms are compatible with angina.  This is likely a situation in the clinical category of INOCA. Recommend aggressive risk factor modification, consider adding angiotensin system  blockade, and aggressive lipid-lowering. Nitroglycerin use for episodes of chest discomfort  Right and left heart cath on 01/22/2015: Ost RCA to Prox RCA lesion, 20% stenosed. The lesion was previously treated with a stent (unknown type)greater than two years ago. Prox RCA lesion, 30% stenosed. Mid RCA lesion, 20% stenosed. The left ventricular systolic function is normal. Prox LAD lesion, 30% stenosed. Mid LAD lesion, 20% stenosed.   1. Widely patent RCA stent with mild nonobstructive coronary artery disease. 2. Normal LV systolic function and left ventricular end-diastolic pressure. 3. Normal filling pressures and no evidence of pulmonary hypertension. PA pressure was 25/5, pulmonary wedge pressure was 5. Cardiac output was mildly reduced at 3.8 with a cardiac index of 2.05.   Recommendations: Shortness of breath does not seem to be due to a cardiac etiology. Continue medical therapy.  Recent Labs: 01/23/2022: Magnesium 2.0 04/29/2022: TSH 3.807 05/09/2022: ALT 58; BUN 17; Creatinine, Ser 1.24; Hemoglobin 12.9; Platelets 222; Potassium 3.8; Sodium 137  Recent Lipid Panel    Component Value Date/Time   CHOL 141 01/22/2022 0045   TRIG 38 01/22/2022 0045   HDL 67 01/22/2022 0045   CHOLHDL 2.1 01/22/2022 0045   VLDL 8 01/22/2022 0045   LDLCALC 66 01/22/2022 0045   LDLCALC 82 09/09/2018 0941    Physical Exam:    VS:  BP (!) 158/65   Pulse (!) 54   Ht '5\' 2"'$  (1.575 m)   Wt 147 lb (66.7 kg)   SpO2 98%   BMI 26.89 kg/m     Wt Readings from Last 3 Encounters:  05/20/22 147 lb (66.7 kg)  04/29/22 168 lb 11.2 oz (76.5 kg)  03/12/22 146 lb (66.2 kg)    GEN:  Well nourished, well developed in no acute distress HEENT: Normal NECK: No JVD; No carotid bruits CARDIAC: S1/S2, RRR, no murmurs, rubs, gallops; 2+ pulses RESPIRATORY:  Clear to auscultation without rales, wheezing or rhonchi  MUSCULOSKELETAL:  No edema; No deformity  SKIN: Warm and dry NEUROLOGIC:  Alert and oriented  x 3 PSYCHIATRIC:  Normal affect   ASSESSMENT:    1. Pre-operative cardiovascular examination   2. Coronary artery disease involving native coronary artery of native heart without angina pectoris   3. Essential hypertension   4. Hyperlipidemia, unspecified hyperlipidemia type    PLAN:    In order of problems listed above:  Pre-operative Cardiovascular risk assessment Ms. Yerby's perioperative risk of a major cardiac event is 6.6% according to the Revised Cardiac Risk Index (RCRI).  Therefore, she is at high risk for perioperative complications.   Her functional capacity is good at 5.07 METs according to the Duke Activity Status Index (DASI). Recommendations: According to ACC/AHA guidelines, no further cardiovascular testing needed.  The patient may proceed to surgery at acceptable risk.   Antiplatelet and/or Anticoagulation Recommendations: Prefer for Aspirin to be continued perioperatively without interruption. From  a cardiac perspective, okay to hold Plavix for 5 days prior to procedure and to resume as soon as possible post op. Does not require SBE prophylaxis. Will route note to requesting party. High Risk RCRI discussed with patient and husband, and they verbalized understanding and are agreeable to proceed.  Case d/w with Dr. Carlyle Dolly who stated okay to proceed.   2. CAD Stable with no anginal symptoms. No indication for ischemic evaluation.  Continue aspirin, Plavix, Imdur, rosuvastatin, and nitroglycerin as needed. Heart healthy diet and regular cardiovascular exercise encouraged.   3. HTN BP elevated today, BP well controlled at home. Discussed to monitor BP at home at least 2 hours after medications and sitting for 5-10 minutes. Discussed SBP goal < 130 and to notify office if SBP remains consistently elevated.  Continue amlodipine, hydralazine, and Imdur. Have room to up-titrate Hydralazine. Would avoid AV nodal agents as HR today is 54, asymptomatic. Heart healthy diet and  regular cardiovascular exercise encouraged.   4. HLD LDL 66 4 months ago.  Continue rosuvastatin. Heart healthy diet and regular cardiovascular exercise encouraged.   5. Disposition: Follow-up with Dr. Carlyle Dolly in 6 months or sooner if anything changes.    Medication Adjustments/Labs and Tests Ordered: Current medicines are reviewed at length with the patient today.  Concerns regarding medicines are outlined above.  Orders Placed This Encounter  Procedures   EKG 12-Lead   No orders of the defined types were placed in this encounter.   Patient Instructions  Medication Instructions:   Your physician recommends that you continue on your current medications as directed. Please refer to the Current Medication list given to you today.  *If you need a refill on your cardiac medications before your next appointment, please call your pharmacy*  Lab Work: NONE ordered at this time of appointment   If you have labs (blood work) drawn today and your tests are completely normal, you will receive your results only by: [place] (if you have MyChart) OR A paper copy in the mail If you have any lab test that is abnormal or we need to change your treatment, we will call you to review the results.  Testing/Procedures: NONE ordered at this time of appointment   Follow-Up: At [facility], you and your health needs are our priority.  As part of our continuing mission to provide you with exceptional heart care, we have created designated Provider Care Teams.  These Care Teams include your primary Cardiologist (physician)  and Advanced Practice Providers (APPs -  Physician Assistants and Nurse Practitioners) who all work together to provide you with the care you need, when you need it.  Your next appointment:   6 month(s)  Provider:   You may see Carlyle Dolly, MD or the following Advanced Practice Provider on your designated Care Team:   Finis Bud, NP    Other  Instructions    Signed, Finis Bud, NP  05/24/2022 5:31 PM    [facility]

## 2022-05-20 NOTE — Patient Instructions (Signed)
Medication Instructions:   Your physician recommends that you continue on your current medications as directed. Please refer to the Current Medication list given to you today.  *If you need a refill on your cardiac medications before your next appointment, please call your pharmacy*  Lab Work: NONE ordered at this time of appointment   If you have labs (blood work) drawn today and your tests are completely normal, you will receive your results only by: [place] (if you have MyChart) OR A paper copy in the mail If you have any lab test that is abnormal or we need to change your treatment, we will call you to review the results.  Testing/Procedures: NONE ordered at this time of appointment   Follow-Up: At [facility], you and your health needs are our priority.  As part of our continuing mission to provide you with exceptional heart care, we have created designated Provider Care Teams.  These Care Teams include your primary Cardiologist (physician) and Advanced Practice Providers (APPs -  Physician Assistants and Nurse Practitioners) who all work together to provide you with the care you need, when you need it.  Your next appointment:   6 month(s)  Provider:   You may see Carlyle Dolly, MD or the following Advanced Practice Provider on your designated Care Team:   Finis Bud, NP    Other Instructions

## 2022-05-25 DIAGNOSIS — G309 Alzheimer's disease, unspecified: Secondary | ICD-10-CM | POA: Diagnosis not present

## 2022-05-31 DIAGNOSIS — I10 Essential (primary) hypertension: Secondary | ICD-10-CM | POA: Diagnosis not present

## 2022-05-31 DIAGNOSIS — E039 Hypothyroidism, unspecified: Secondary | ICD-10-CM | POA: Diagnosis not present

## 2022-05-31 DIAGNOSIS — I249 Acute ischemic heart disease, unspecified: Secondary | ICD-10-CM | POA: Diagnosis not present

## 2022-05-31 DIAGNOSIS — I700 Atherosclerosis of aorta: Secondary | ICD-10-CM | POA: Diagnosis not present

## 2022-06-02 NOTE — Telephone Encounter (Signed)
Please see cardiology notes 2/21. Thanks!

## 2022-06-03 DIAGNOSIS — Z85828 Personal history of other malignant neoplasm of skin: Secondary | ICD-10-CM | POA: Diagnosis not present

## 2022-06-03 DIAGNOSIS — L570 Actinic keratosis: Secondary | ICD-10-CM | POA: Diagnosis not present

## 2022-06-03 NOTE — Telephone Encounter (Signed)
Left message to return call to schedule EGD +/- ED. ASA 3; Endo 3. Clearance received to hold Plavix x 5 days.

## 2022-06-04 NOTE — Telephone Encounter (Signed)
Left message to return call on both mobile and home number.

## 2022-06-08 ENCOUNTER — Encounter (INDEPENDENT_AMBULATORY_CARE_PROVIDER_SITE_OTHER): Payer: Self-pay | Admitting: Gastroenterology

## 2022-06-08 ENCOUNTER — Ambulatory Visit (INDEPENDENT_AMBULATORY_CARE_PROVIDER_SITE_OTHER): Payer: Medicare Other | Admitting: Gastroenterology

## 2022-06-08 VITALS — BP 145/69 | HR 58 | Temp 97.3°F | Ht 62.0 in | Wt 144.3 lb

## 2022-06-08 DIAGNOSIS — K581 Irritable bowel syndrome with constipation: Secondary | ICD-10-CM | POA: Diagnosis not present

## 2022-06-08 DIAGNOSIS — R109 Unspecified abdominal pain: Secondary | ICD-10-CM

## 2022-06-08 MED ORDER — HYOSCYAMINE SULFATE ER 0.375 MG PO TB12: 0.3750 mg | ORAL_TABLET | Freq: Every day | ORAL | 1 refills | Status: DC

## 2022-06-08 NOTE — Telephone Encounter (Signed)
Pt in office today to see [facility]. Per [place], does not need EGD.

## 2022-06-08 NOTE — Patient Instructions (Addendum)
Start taking Miralax 2 capful every 12 hours. If after two weeks there is no improvement, increase to 1 capful every 8 hours Increase water intake, aim for atleast 64 oz per day Increase fruits, veggies and whole grains, kiwi and prunes are especially good for constipation I am sending hyoscyamine to help with your abdominal discomfort, you can take this once per day as needed for abdominal pain If symptoms are not improving with the above interventions, please make me aware As you are not having any upper gastrointestinal symptoms and swallowing and nausea resolved, we will hold off on doing upper endoscopy for now.  Follow up 3 months

## 2022-06-08 NOTE — Progress Notes (Unsigned)
Referring Provider: Manon Hilding, MD Primary Care Physician:  Annette Hilding, MD Primary GI Physician: Jenetta Downer   Chief Complaint  Patient presents with   Follow-up    Patient here today for a follow up. Patient says she needs to set up an EGD. Patient says she has stomach pains after eating. Patient says she stays constipated and she eats fruits and vegetables. She says takes Miralax daily and eats prunes daily prn.   HPI:   Annette Sharp is a 82 y.o. female with past medical history of anxiety, COPD, CAD, depression, GERD, HLD, HTN, hypothyroidism, PUD, sleep apnea, stroke, IBS    Patient presenting today for abdominal pain and constipation.  Last seen November 2023, at that time having dysphagia and nausea as well as constipation. Recommended to schedule EGD after cardiac clearance, start miralax for constipation.   EGD was not yet done as patient had hospital admissions after her OV.   Today, Patient reports she does not know why the EGD has not been scheduled from her last visit in November. she is not having any dysphagia at this time. She had resolution of her of nausea as well. She has cramping in her lower abdomen that occurs sometimes after eating, though per chart review, this appears to be a chronic issue for her. She does not have abdominal cramping every day. She is unsure if certain foods make symptoms worse. She sometimes has to lay down when the pain occurs.  She does not know if symptoms resolve with a BM or not. She has had about 16 pounds weight loss over the past year though reports that she does not really feel hungry. Has had strokes and diagnosis of alzheimers in that time with some new medications implemented. She reports she has to make herself eat. Denies any blood in stools or black stools. She has constipation, having a BM maybe every 5 days with hard stool balls. Is doing 1 capful of miralax and a prune per day. She note some improvement in consistency of stools  when starting miralax, but not much. She drinks maybe 2 glasses of water per day, drinks a lot of mello yellow per her husband. Doing 1-2 Boost shakes per day. Denies early satiety.   She notes some episodes of feeling hot in her chest and this radiating down her arms, she is unsure what brings this on. She denies any known anxiety.    Last Colonoscopy:05/2021 - One small polyp at the hepatic flexure, removed                            with a cold snare. Resected and retrieved.                           - One small polyp at the splenic flexure. Biopsied.                           - Diverticulosis in the sigmoid colon.                           - External hemorrhoids.  Last Endoscopy:  Recommendations:    Past Medical History:  Diagnosis Date   Anxiety    Back pain    Cerebrovascular disease    NONOBSTRUCTIVE CVA BY CAROTID DOPPLERS OCTOBER 2008   Chronic  renal insufficiency    GFR 45 ML'S PER MINUTE   COPD (chronic obstructive pulmonary disease) (HCC)    Coronary artery disease    a. s/p DES to RCA in 2007 b. patent stent by cath in 12/2014 with nonobstructive disease along LAD and RCA   Coronary atherosclerosis of native coronary artery    NEGATIVE LEXISCAN FOR ISCHEMIA MARCH 23,2010 NORMAL LV FUNCTTION   Depression    Dyspnea    with exertion   GERD (gastroesophageal reflux disease)    Headache    chronic headache from fall in December   Hyperlipidemia    Hypertension    Hypothyroidism    Other and unspecified angina pectoris    ANGINA   Overweight(278.02)    OBESITY   Peptic ulcer disease    Primary localized osteoarthritis of left knee    Sleep apnea    SEVERE OBSTRUCTIVE    cpap   Stroke ([place])    affected memory   Thyroid disease    TREATED HYPOTHYROIDISM    Past Surgical History:  Procedure Laterality Date   ABDOMINAL HYSTERECTOMY     BIOPSY  06/18/2021   Procedure: BIOPSY;  Surgeon: Rogene Houston, MD;  [facility];  Service: Endoscopy;;    CARDIAC CATHETERIZATION N/A 01/22/2015   Procedure: Right/Left Heart Cath and Coronary Angiography;  Surgeon: Wellington Hampshire, MD;  Location: [facility];  Service: Cardiovascular;  Laterality: N/A;   CARPAL TUNNEL RELEASE     CHOLECYSTECTOMY     COLONOSCOPY N/A 03/31/2018   Procedure: COLONOSCOPY;  Surgeon: Rogene Houston, MD;  [facility]: [facility];  Service: Endoscopy;  Laterality: N/A;  2:45   COLONOSCOPY WITH PROPOFOL N/A 06/18/2021   Procedure: COLONOSCOPY WITH PROPOFOL;  Surgeon: Rogene Houston, MD;  [facility]: [facility];  Service: Endoscopy;  Laterality: N/A;  11:00   JOINT REPLACEMENT     LEFT HEART CATH AND CORONARY ANGIOGRAPHY N/A 04/28/2018   Procedure: LEFT HEART CATH AND CORONARY ANGIOGRAPHY;  Surgeon: Belva Crome, MD;  Location: [facility];  Service: Cardiovascular;  Laterality: N/A;   POLYPECTOMY  03/31/2018   Procedure: POLYPECTOMY;  Surgeon: Rogene Houston, MD;  [facility];  Service: Endoscopy;;  splen. flex, cecum   POLYPECTOMY  06/18/2021   Procedure: POLYPECTOMY;  Surgeon: Rogene Houston, MD;  [facility]: [facility];  Service: Endoscopy;;   REPLACEMENT TOTAL KNEE Right 07/28/2010   ROTATOR CUFF REPAIR Right 07/08/2009   TOTAL ABDOMINAL HYSTERECTOMY W/ BILATERAL SALPINGOOPHORECTOMY     TOTAL KNEE ARTHROPLASTY Left 04/08/2015   Procedure: LEFT TOTAL KNEE ARTHROPLASTY;  Surgeon: Elsie Saas, MD;  Location: [facility];  Service: Orthopedics;  Laterality: Left;   TOTAL SHOULDER ARTHROPLASTY Left 06/02/2017   Procedure: TOTAL SHOULDER ARTHROPLASTY;  Surgeon: Hiram Gash, MD;  Location: [facility];  Service: Orthopedics;  Laterality: Left;    Current Outpatient Medications  Medication Sig Dispense Refill   acyclovir (ZOVIRAX) 400 MG tablet Take 400 mg by mouth in the morning.     albuterol (VENTOLIN HFA) 108 (90 Base) MCG/ACT inhaler Inhale 2 puffs into the lungs every 6 (six) hours as needed for wheezing or shortness of breath. 1 each 2    amLODipine (NORVASC) 10 MG tablet Take 10 mg by mouth every morning.  3   aspirin EC 81 MG tablet Take 81 mg by mouth in the morning.     buPROPion (WELLBUTRIN SR) 150 MG 12 hr tablet Take 150  mg by mouth 2 (two) times daily.     cetirizine (ZYRTEC) 10 MG tablet Take 10 mg by mouth as needed for allergies or rhinitis.     citalopram (CELEXA) 20 MG tablet Take 20 mg by mouth every morning.     clopidogrel (PLAVIX) 75 MG tablet Take 1 tablet (75 mg total) by mouth every morning.     donepezil (ARICEPT) 10 MG tablet Take 10 mg by mouth daily.     hydrALAZINE (APRESOLINE) 50 MG tablet Take 1 tablet (50 mg total) by mouth 2 (two) times daily. 60 tablet 0   hyoscyamine (LEVBID) 0.375 MG 12 hr tablet TAKE 1 TABLET (0.375 MG TOTAL) BY MOUTH DAILY. 90 tablet 1   isosorbide mononitrate (IMDUR) 120 MG 24 hr tablet Take 1 tablet (120 mg total) by mouth in the morning and at bedtime. 180 tablet 3   lamoTRIgine 50 MG TBDP Take 25 mg by mouth 2 (two) times daily.     levothyroxine (SYNTHROID) 75 MCG tablet Take 75 mcg by mouth daily.     nitroGLYCERIN (NITROSTAT) 0.4 MG SL tablet Place 1 tablet (0.4 mg total) under the tongue every 5 (five) minutes x 3 doses as needed for chest pain (if no relief after 3rd dose, proceed to the ED for an evaluation). 25 tablet 3   ondansetron (ZOFRAN-ODT) 4 MG disintegrating tablet Take 1 tablet (4 mg total) by mouth every 8 (eight) hours as needed for nausea. 10 tablet 0   QUEtiapine (SEROQUEL) 25 MG tablet Take 25 mg by mouth at bedtime.     rosuvastatin (CRESTOR) 20 MG tablet TAKE 1 TABLET BY MOUTH EVERY DAY (Patient taking differently: Take 20 mg by mouth daily.) 90 tablet 1   thiamine (VITAMIN B-1) 100 MG tablet Take 100 mg by mouth daily.     No current facility-administered medications for this visit.    Allergies as of 06/08/2022 - Review Complete 06/08/2022  Allergen Reaction Noted   Ace inhibitors Cough 08/14/2008   Gemfibrozil Other (See Comments) 08/14/2008    Moxifloxacin Rash 08/14/2008   Ranexa [ranolazine] Other (See Comments) 11/10/2013    Family History  Problem Relation Age of Onset   CAD Other    Hypertension Other    Heart attack Other    Cancer Other    CAD Mother    Cancer Mother    CAD Father    Diabetes Sister    CAD Sister    Leukemia Brother     Social History   Socioeconomic History   Marital status: Married    Spouse name: Not on file   Number of children: Not on file   Years of education: Not on file   Highest education level: Not on file  Occupational History   Not on file  Tobacco Use   Smoking status: Never   Smokeless tobacco: Never  Vaping Use   Vaping Use: Never used  Substance and Sexual Activity   Alcohol use: No    Alcohol/week: 0.0 standard drinks of alcohol   Drug use: No   Sexual activity: Never  Other Topics Concern   Not on file  Social History Narrative   Not on file   Social Determinants of Health   Financial Resource Strain: Not on file  Food Insecurity: No Food Insecurity (01/21/2022)   Hunger Vital Sign    Worried About Running Out of Food in the Last Year: Never true    [place] in the Last Year: Never  true  Transportation Needs: No Transportation Needs (01/21/2022)   PRAPARE - Hydrologist (Medical): No    Lack of Transportation (Non-Medical): No  Physical Activity: Not on file  Stress: Not on file  Social Connections: Not on file   Review of systems General: negative for malaise, night sweats, fever, chills, +weight loss Neck: Negative for lumps, goiter, pain and significant neck swelling Resp: Negative for cough, wheezing, dyspnea at rest CV: Negative for chest pain, leg swelling, palpitations, orthopnea GI: denies melena, hematochezia, nausea, vomiting, diarrhea, constipation, dysphagia, odyonophagia, early satiety. +weight loss +lower abdominal cramping  MSK: Negative for joint pain or swelling, back pain, and muscle pain. Derm:  Negative for itching or rash Psych: Denies depression, anxiety, memory loss, confusion. No homicidal or suicidal ideation.  Heme: Negative for prolonged bleeding, bruising easily, and swollen nodes. Endocrine: Negative for cold or heat intolerance, polyuria, polydipsia and goiter. Neuro: negative for tremor, gait imbalance, syncope and seizures. The remainder of the review of systems is noncontributory.  Physical Exam: BP (!) 145/69 (BP Location: Right Arm, Patient Position: Sitting, Cuff Size: Normal)   Pulse (!) 58   Temp (!) 97.3 F (36.3 C) (Temporal)   Ht '5\' 2"'$  (1.575 m)   Wt 144 lb 4.8 oz (65.5 kg)   BMI 26.39 kg/m  General:   Alert and oriented. No distress noted. Pleasant and cooperative.  Head:  Normocephalic and atraumatic. Eyes:  Conjuctiva clear without scleral icterus. Mouth:  Oral mucosa pink and moist. Good dentition. No lesions. Heart: Normal rate and rhythm, s1 and s2 heart sounds present.  Lungs: Clear lung sounds in all lobes. Respirations equal and unlabored. Abdomen:  +BS, soft, non-tender and non-distended. No rebound or guarding. No HSM or masses noted. Derm: No palmar erythema or jaundice Msk:  Symmetrical without gross deformities. Normal posture. Extremities:  Without edema. Neurologic:  Alert and  oriented x4 Psych:  Alert and cooperative. Normal mood and affect.  Invalid input(s): "6 MONTHS"   ASSESSMENT: SHARIL LABORIN is a 82 y.o. female presenting today for follow up.  Dysphagia and nausea that were occurring previously when patient was recommended to proceed with EGD have now resolved. She has no UGI symptoms. She notes intermittent lower abdominal cramping. She is unsure of alleviating factors, sometimes eating brings on the pain, but this does not occur with every meal. She denies rectal bleeding or melena. Having a BM maybe once per week doing 1 capful of miralax. Appetite is low. She has history of IBS and is not reportedly taking any anti  spasmodics. Recent colonoscopy in march 2023. No indication for proceeding with EGD at this time given resolution of her UGI symptoms. Recommend she increase water intake, start miralax 1 capful BID x1 week then increase to 1 capful TID if no improvement. I suspect appetite may improve if she is moving her bowels more frequently, though this is likely somewhat multifactorial in setting of other multimorbidities. Low suspicion for chronic mesenteric ischemia given her presentation. Will also send low dose hyoscyamine 0.'125mg'$  daily to use PRN for abdominal cramping. She will let me know if symptoms do not improve with the above mentioned implementations.    PLAN:  Increase miralax to BID x1 week, increase to TID if no improvement  2. Increase water intake, aim for atleast 64 oz per day Increase fruits, veggies and whole grains, kiwi and prunes are especially good for constipation 3. Hold off on schduling EGD for now 4. Rx  hyosciamine 0.'375mg'$  daily PRN   All questions were answered, patient verbalized understanding and is in agreement with plan as outlined above.   Follow Up: 3 months   Niana Martorana L. 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 Will repeat MELD labs, if persistently elevated may consider referral for liver transplant evaluation. Needs to completely abstain from alcohol. Will also check hepatitis A and B immunization status.  Would favor [place] and AFP in 10/2022.

## 2022-06-09 DIAGNOSIS — R109 Unspecified abdominal pain: Secondary | ICD-10-CM | POA: Insufficient documentation

## 2022-06-09 NOTE — Progress Notes (Signed)
06/09/22-pt was in office yesterday and Vikki Ports stated no need for EGD at this time.

## 2022-07-13 ENCOUNTER — Encounter (INDEPENDENT_AMBULATORY_CARE_PROVIDER_SITE_OTHER): Payer: Self-pay | Admitting: Gastroenterology

## 2022-08-05 ENCOUNTER — Encounter (INDEPENDENT_AMBULATORY_CARE_PROVIDER_SITE_OTHER): Payer: Self-pay | Admitting: Gastroenterology

## 2022-08-20 DIAGNOSIS — R5383 Other fatigue: Secondary | ICD-10-CM | POA: Diagnosis not present

## 2022-08-20 DIAGNOSIS — K219 Gastro-esophageal reflux disease without esophagitis: Secondary | ICD-10-CM | POA: Diagnosis not present

## 2022-08-20 DIAGNOSIS — I10 Essential (primary) hypertension: Secondary | ICD-10-CM | POA: Diagnosis not present

## 2022-08-20 DIAGNOSIS — E7849 Other hyperlipidemia: Secondary | ICD-10-CM | POA: Diagnosis not present

## 2022-08-26 DIAGNOSIS — N1832 Chronic kidney disease, stage 3b: Secondary | ICD-10-CM | POA: Diagnosis not present

## 2022-08-26 DIAGNOSIS — E039 Hypothyroidism, unspecified: Secondary | ICD-10-CM | POA: Diagnosis not present

## 2022-08-26 DIAGNOSIS — I700 Atherosclerosis of aorta: Secondary | ICD-10-CM | POA: Diagnosis not present

## 2022-08-26 DIAGNOSIS — R5383 Other fatigue: Secondary | ICD-10-CM | POA: Diagnosis not present

## 2022-08-26 DIAGNOSIS — I880 Nonspecific mesenteric lymphadenitis: Secondary | ICD-10-CM | POA: Diagnosis not present

## 2022-08-26 DIAGNOSIS — I10 Essential (primary) hypertension: Secondary | ICD-10-CM | POA: Diagnosis not present

## 2022-09-02 DIAGNOSIS — R413 Other amnesia: Secondary | ICD-10-CM | POA: Diagnosis not present

## 2022-09-02 DIAGNOSIS — R202 Paresthesia of skin: Secondary | ICD-10-CM | POA: Diagnosis not present

## 2022-09-02 DIAGNOSIS — R2689 Other abnormalities of gait and mobility: Secondary | ICD-10-CM | POA: Diagnosis not present

## 2022-09-09 DIAGNOSIS — R4182 Altered mental status, unspecified: Secondary | ICD-10-CM | POA: Diagnosis not present

## 2022-09-21 ENCOUNTER — Ambulatory Visit (INDEPENDENT_AMBULATORY_CARE_PROVIDER_SITE_OTHER): Payer: Medicare Other | Admitting: Gastroenterology

## 2022-09-23 ENCOUNTER — Ambulatory Visit: Payer: Medicare Other | Admitting: Cardiology

## 2022-09-24 ENCOUNTER — Other Ambulatory Visit (INDEPENDENT_AMBULATORY_CARE_PROVIDER_SITE_OTHER): Payer: Self-pay | Admitting: Gastroenterology

## 2022-10-07 DIAGNOSIS — E039 Hypothyroidism, unspecified: Secondary | ICD-10-CM | POA: Diagnosis not present

## 2022-10-13 DIAGNOSIS — R252 Cramp and spasm: Secondary | ICD-10-CM | POA: Diagnosis not present

## 2022-10-13 DIAGNOSIS — G629 Polyneuropathy, unspecified: Secondary | ICD-10-CM | POA: Diagnosis not present

## 2022-10-13 DIAGNOSIS — M791 Myalgia, unspecified site: Secondary | ICD-10-CM | POA: Diagnosis not present

## 2022-10-13 DIAGNOSIS — M2550 Pain in unspecified joint: Secondary | ICD-10-CM | POA: Diagnosis not present

## 2022-10-13 DIAGNOSIS — R569 Unspecified convulsions: Secondary | ICD-10-CM | POA: Diagnosis not present

## 2022-10-21 DIAGNOSIS — R2689 Other abnormalities of gait and mobility: Secondary | ICD-10-CM | POA: Diagnosis not present

## 2022-10-21 DIAGNOSIS — I639 Cerebral infarction, unspecified: Secondary | ICD-10-CM | POA: Diagnosis not present

## 2022-10-21 DIAGNOSIS — G40209 Localization-related (focal) (partial) symptomatic epilepsy and epileptic syndromes with complex partial seizures, not intractable, without status epilepticus: Secondary | ICD-10-CM | POA: Diagnosis not present

## 2022-10-21 DIAGNOSIS — R413 Other amnesia: Secondary | ICD-10-CM | POA: Diagnosis not present

## 2022-10-26 ENCOUNTER — Ambulatory Visit (INDEPENDENT_AMBULATORY_CARE_PROVIDER_SITE_OTHER): Payer: Medicare Other | Admitting: Gastroenterology

## 2022-10-28 DIAGNOSIS — I10 Essential (primary) hypertension: Secondary | ICD-10-CM | POA: Diagnosis not present

## 2022-10-28 DIAGNOSIS — I214 Non-ST elevation (NSTEMI) myocardial infarction: Secondary | ICD-10-CM | POA: Diagnosis not present

## 2022-10-30 DIAGNOSIS — R300 Dysuria: Secondary | ICD-10-CM | POA: Diagnosis not present

## 2022-11-02 ENCOUNTER — Ambulatory Visit (INDEPENDENT_AMBULATORY_CARE_PROVIDER_SITE_OTHER): Payer: Medicare Other | Admitting: Gastroenterology

## 2022-11-18 ENCOUNTER — Ambulatory Visit: Payer: Medicare Other | Admitting: Cardiology

## 2022-11-18 NOTE — Progress Notes (Deleted)
Clinical Summary Ms. Gerlich is a 82 y.o.female  seen today for follow up of the following medical problems.    1. CAD - Prior DES to RCA in 2007.  - cath Jan 2020 without significant disease   - at prior visit reprotedchest pain. Pulling like pain in midchest into right shoulder, has had prior shoulder replacements - usually occurs with activity.Not positional. Lasts about 10 minutes. Some SOB, feels dizzy/lightheaded. - highest level of activity is house work, or walking to [employer].  - better with with SL NG   01/2020 nuclear stress: no ischemia, short runs of atach.    11/2020 echo: LVEF 65-70%, grade II dd, normal RV, mid MR, mild AI   12/2020 ER visit [facility] with chest pain - from notes described right sided pleuritic chest pain -  Trop neg x 2. CXR no acute process. CTA chest no PE, atelectasis.  - no other chest pains. Chronic SOB unchanged, chronic fatigue.  - Jan 2023 PFTs: mild diffusion defect     - admit 12/2021 with chest pain, trop up to 234. EKG without acute ischemic changes - 12/2021 echo: LVEF 60-65% no WMAs - managed medically given mild trop elevation, advanced age, severe dementia, and comorbidities   - occasional symptoms, 1-2 times per week.      2. HTN -  visit 10/2019 with PA Vincenza Hews hydralazine 25mg  bid added - she is compliant with meds     3. OSA - not able to tolerate cpap     4. Hyperlipidemia - 08/2018 TC 157 HDL 61 TG LD 82  - labs followed by pcp - compliant with crestor 20mg  daily   12/2021 LDL 66    5. History of prior CVA - has been on plavix.     6. Dental extractions - upcoming dental cleaning, endoscopy     7. Dementia Past Medical History:  Diagnosis Date   Anxiety    Back pain    Cerebrovascular disease    NONOBSTRUCTIVE CVA BY CAROTID DOPPLERS OCTOBER 2008   Chronic renal insufficiency    GFR 45 ML'S PER MINUTE   COPD (chronic obstructive pulmonary disease) (HCC)    Coronary artery disease    a. s/p  DES to RCA in 2007 b. patent stent by cath in 12/2014 with nonobstructive disease along LAD and RCA   Coronary atherosclerosis of native coronary artery    NEGATIVE LEXISCAN FOR ISCHEMIA MARCH 23,2010 NORMAL LV FUNCTTION   Depression    Dyspnea    with exertion   GERD (gastroesophageal reflux disease)    Headache    chronic headache from fall in December   Hyperlipidemia    Hypertension    Hypothyroidism    Other and unspecified angina pectoris    ANGINA   Overweight(278.02)    OBESITY   Peptic ulcer disease    Primary localized osteoarthritis of left knee    Sleep apnea    SEVERE OBSTRUCTIVE    cpap   Stroke (HCC)    affected memory   Thyroid disease    TREATED HYPOTHYROIDISM     Allergies  Allergen Reactions   Ace Inhibitors Cough   Gemfibrozil Other (See Comments)    Muscle Aches   Moxifloxacin Rash   Ranexa [Ranolazine] Other (See Comments)    "NIGHTMARES"     Current Outpatient Medications  Medication Sig Dispense Refill   acyclovir (ZOVIRAX) 400 MG tablet Take 400 mg by mouth in the morning.  albuterol (VENTOLIN HFA) 108 (90 Base) MCG/ACT inhaler Inhale 2 puffs into the lungs every 6 (six) hours as needed for wheezing or shortness of breath. 1 each 2   amLODipine (NORVASC) 10 MG tablet Take 10 mg by mouth every morning.  3   aspirin EC 81 MG tablet Take 81 mg by mouth in the morning.     buPROPion (WELLBUTRIN SR) 150 MG 12 hr tablet Take 150 mg by mouth 2 (two) times daily.     cetirizine (ZYRTEC) 10 MG tablet Take 10 mg by mouth as needed for allergies or rhinitis.     citalopram (CELEXA) 20 MG tablet Take 20 mg by mouth every morning.     clopidogrel (PLAVIX) 75 MG tablet Take 1 tablet (75 mg total) by mouth every morning.     donepezil (ARICEPT) 10 MG tablet Take 10 mg by mouth daily.     hydrALAZINE (APRESOLINE) 50 MG tablet Take 1 tablet (50 mg total) by mouth 2 (two) times daily. 60 tablet 0   hyoscyamine (LEVBID) 0.375 MG 12 hr tablet TAKE 1 TABLET BY  MOUTH DAILY 60 tablet 1   isosorbide mononitrate (IMDUR) 120 MG 24 hr tablet Take 1 tablet (120 mg total) by mouth in the morning and at bedtime. 180 tablet 3   lamoTRIgine 50 MG TBDP Take 25 mg by mouth 2 (two) times daily.     levothyroxine (SYNTHROID) 75 MCG tablet Take 75 mcg by mouth daily.     nitroGLYCERIN (NITROSTAT) 0.4 MG SL tablet Place 1 tablet (0.4 mg total) under the tongue every 5 (five) minutes x 3 doses as needed for chest pain (if no relief after 3rd dose, proceed to the ED for an evaluation). 25 tablet 3   ondansetron (ZOFRAN-ODT) 4 MG disintegrating tablet Take 1 tablet (4 mg total) by mouth every 8 (eight) hours as needed for nausea. 10 tablet 0   QUEtiapine (SEROQUEL) 25 MG tablet Take 25 mg by mouth at bedtime.     rosuvastatin (CRESTOR) 20 MG tablet TAKE 1 TABLET BY MOUTH EVERY DAY (Patient taking differently: Take 20 mg by mouth daily.) 90 tablet 1   thiamine (VITAMIN B-1) 100 MG tablet Take 100 mg by mouth daily.     No current facility-administered medications for this visit.     Past Surgical History:  Procedure Laterality Date   ABDOMINAL HYSTERECTOMY     BIOPSY  06/18/2021   Procedure: BIOPSY;  Surgeon: Malissa Hippo, MD;  [facility];  Service: Endoscopy;;   CARDIAC CATHETERIZATION N/A 01/22/2015   Procedure: Right/Left Heart Cath and Coronary Angiography;  Surgeon: Iran Ouch, MD;  [facility];  Service: Cardiovascular;  Laterality: N/A;   CARPAL TUNNEL RELEASE     CHOLECYSTECTOMY     COLONOSCOPY N/A 03/31/2018   Procedure: COLONOSCOPY;  Surgeon: Malissa Hippo, MD;  [facility];  Service: Endoscopy;  Laterality: N/A;  2:45   COLONOSCOPY WITH PROPOFOL N/A 06/18/2021   Procedure: COLONOSCOPY WITH PROPOFOL;  Surgeon: Malissa Hippo, MD;  [facility];  Service: Endoscopy;  Laterality: N/A;  11:00   JOINT REPLACEMENT     LEFT HEART CATH AND CORONARY ANGIOGRAPHY N/A 04/28/2018   Procedure: LEFT HEART  CATH AND CORONARY ANGIOGRAPHY;  Surgeon: Lyn Records, MD;  [facility];  Service: Cardiovascular;  Laterality: N/A;   POLYPECTOMY  03/31/2018   Procedure: POLYPECTOMY;  Surgeon: Malissa Hippo, MD;  [facility];  Service: Endoscopy;;  splen. flex, cecum   POLYPECTOMY  06/18/2021   Procedure: POLYPECTOMY;  Surgeon: Malissa Hippo, MD;  [facility];  Service: Endoscopy;;   REPLACEMENT TOTAL KNEE Right 07/28/2010   ROTATOR CUFF REPAIR Right 07/08/2009   TOTAL ABDOMINAL HYSTERECTOMY W/ BILATERAL SALPINGOOPHORECTOMY     TOTAL KNEE ARTHROPLASTY Left 04/08/2015   Procedure: LEFT TOTAL KNEE ARTHROPLASTY;  Surgeon: Salvatore Marvel, MD;  Location: [facility];  Service: Orthopedics;  Laterality: Left;   TOTAL SHOULDER ARTHROPLASTY Left 06/02/2017   Procedure: TOTAL SHOULDER ARTHROPLASTY;  Surgeon: Bjorn Pippin, MD;  [facility];  Service: Orthopedics;  Laterality: Left;     Allergies  Allergen Reactions   Ace Inhibitors Cough   Gemfibrozil Other (See Comments)    Muscle Aches   Moxifloxacin Rash   Ranexa [Ranolazine] Other (See Comments)    "NIGHTMARES"      Family History  Problem Relation Age of Onset   CAD Other    Hypertension Other    Heart attack Other    Cancer Other    CAD Mother    Cancer Mother    CAD Father    Diabetes Sister    CAD Sister    Leukemia Brother      Social History Ms. Schremp reports that she has never smoked. She has never used smokeless tobacco. Ms. Johnson reports no history of alcohol use.   Review of Systems CONSTITUTIONAL: No weight loss, fever, chills, weakness or fatigue.  HEENT: Eyes: No visual loss, blurred vision, double vision or yellow sclerae.No hearing loss, sneezing, congestion, runny nose or sore throat.  SKIN: No rash or itching.  CARDIOVASCULAR:  RESPIRATORY: No shortness of breath, cough or sputum.  GASTROINTESTINAL: No anorexia, nausea, vomiting or diarrhea. No abdominal pain or blood.   GENITOURINARY: No burning on urination, no polyuria NEUROLOGICAL: No headache, dizziness, syncope, paralysis, ataxia, numbness or tingling in the extremities. No change in bowel or bladder control.  MUSCULOSKELETAL: No muscle, back pain, joint pain or stiffness.  LYMPHATICS: No enlarged nodes. No history of splenectomy.  PSYCHIATRIC: No history of depression or anxiety.  ENDOCRINOLOGIC: No reports of sweating, cold or heat intolerance. No polyuria or polydipsia.  Marland Kitchen   Physical Examination There were no vitals filed for this visit. There were no vitals filed for this visit.  Gen: resting comfortably, no acute distress HEENT: no scleral icterus, pupils equal round and reactive, no palptable cervical adenopathy,  CV Resp: Clear to auscultation bilaterally GI: abdomen is soft, non-tender, non-distended, normal bowel sounds, no hepatosplenomegaly MSK: extremities are warm, no edema.  Skin: warm, no rash Neuro:  no focal deficits Psych: appropriate affect   Diagnostic Studies Cardiac catheterization 04/28/2018:   Moderate three-vessel coronary disease, with diffuse proximal to mid calcification in all territories. Left main 25% diffuse narrowing Proximal to mid LAD 40 to 50%.  Unchanged from prior angiogram Proximal 30% circumflex.  First OM is small with irregularities. RCA contains a proximal to ostial stent that has 30% proximal narrowing.  Luminal irregularities are noted throughout the mid right coronary.  No significant obstruction is seen. Normal left ventricular function with EF greater than 50%.  LVEDP is normal.     01/2020 nuclear stress There was no ST segment deviation noted during stress. Short runs of atrial tachycardia with just mildly elevated rates around 110 The study is normal. There are no perfusion defects This is a low risk study. The left ventricular ejection fraction is hyperdynamic (>65%).  Nonspecific finding of elevated TID in absence of perfusion  defects   11/2020 echo IMPRESSIONS     1. Left ventricular ejection fraction, by estimation, is 65 to 70%. The  left ventricle has normal function. The left ventricle has no regional  wall motion abnormalities. There is mild left ventricular hypertrophy.  Left ventricular diastolic parameters  are consistent with Grade II diastolic dysfunction (pseudonormalization).  Elevated left ventricular end-diastolic pressure.   2. Right ventricular systolic function is normal. The right ventricular  size is normal. There is normal pulmonary artery systolic pressure. The  estimated right ventricular systolic pressure is 26.6 mmHg.   3. Left atrial size was mildly dilated.   4. There is a trivial pericardial effusion posterior to the left  ventricle.   5. The mitral valve is abnormal. Mild mitral valve regurgitation.   6. The aortic valve is tricuspid. There is mild calcification of the  aortic valve. Aortic valve regurgitation is mild. Mild to moderate aortic  valve sclerosis/calcification is present, without any evidence of aortic  stenosis. Aortic regurgitation PHT  measures 465 msec.   7. The inferior vena cava is normal in size with greater than 50%  respiratory variability, suggesting right atrial pressure of 3 mmHg.    Jan 2023 PFTs: mild diffusion defect    Assessment and Plan   1. CAD with other forms of angina - some recent chest pains, will increase imdur to 120mg  bid. Not a cath candidate due to severe advanced dementia, recent NSTEMI managed medically.    2. HTN -mildly elevated, see how responds to imdur increase reported above   3. Hyperlipidemia -at goal, continue current meds       Antoine Poche, M.D., F.A.C.C.

## 2022-11-26 ENCOUNTER — Ambulatory Visit (INDEPENDENT_AMBULATORY_CARE_PROVIDER_SITE_OTHER): Payer: Medicare Other | Admitting: Gastroenterology

## 2022-11-26 ENCOUNTER — Encounter (INDEPENDENT_AMBULATORY_CARE_PROVIDER_SITE_OTHER): Payer: Self-pay | Admitting: Gastroenterology

## 2022-11-26 VITALS — BP 136/71 | HR 65 | Temp 98.1°F | Ht 62.0 in | Wt 152.3 lb

## 2022-11-26 DIAGNOSIS — D485 Neoplasm of uncertain behavior of skin: Secondary | ICD-10-CM | POA: Diagnosis not present

## 2022-11-26 DIAGNOSIS — L570 Actinic keratosis: Secondary | ICD-10-CM | POA: Diagnosis not present

## 2022-11-26 DIAGNOSIS — K581 Irritable bowel syndrome with constipation: Secondary | ICD-10-CM | POA: Diagnosis not present

## 2022-11-26 DIAGNOSIS — R109 Unspecified abdominal pain: Secondary | ICD-10-CM

## 2022-11-26 NOTE — Patient Instructions (Signed)
Please let me know if you are still doing hyoscyamine as this can help slow down urgency of bowel movements Continue with good water intake Follow up with PCP regarding cramping all over  Follow up 1 year

## 2022-11-26 NOTE — Progress Notes (Addendum)
Referring Provider: Estanislado Pandy, MD Primary Care Physician:  Estanislado Pandy, MD Primary GI Physician: Dr. Levon Hedger   Chief Complaint  Patient presents with   Irritable Bowel Syndrome    Follow up on IBS - states she takes miralax as needed but has not needed in awhile. Takes hysocamine daily but states still having cramping.    HPI:   Annette Sharp is a 82 y.o. female with past medical history of anxiety, COPD, CAD, depression, GERD, HLD, HTN, hypothyroidism, PUD, sleep apnea, stroke, IBS   Patient presenting today for follow up of IBS/abdominal cramping  Last seen march 2024, at that time having cramping in lower abdomen, sometime after eating, chronic. Decreased appetite, having constipation with a BM every 5 days, some improvement with miralax and prunes.   Recommended to increase miralax to BID, increase water intake, hyoscyamine 0.375mg  daily PRN  Present:  Patient notes she is having cramping all over her body to include her head. She has to take a spoonful of mustard to help alleviate this. She notes cramping occurs mostly in chest/arms/legs. Abdominal cramping is much improved, having this rarely now. She is still having some episodes of feeling "hot" in her upper body. She recently found out she is having seizures and is now on medication for this. She has upcoming labs next week with her PCP. She feels constipation is improved, taking miralax rarely. Having a BM daily with solid stools, sometimes with urgency after a meal. No nausea or vomiting. Appetite is not great, husband states he has to make her eat . Weight is 152, appears she has fluctuated from 168 all the way down to 147 over the past year. She is doing 2 ensures recently. She notes they check potassium recently which was normal, unsure if magnesium levels have been checked. She is drinking plenty of water. She thinks she is taking hyoscyamine daily. No rectal bleeding or melena.    Last Colonoscopy:05/2021 - One  small polyp at the hepatic flexure, removed                            with a cold snare. Resected and retrieved.                           - One small polyp at the splenic flexure. Biopsied.                           - Diverticulosis in the sigmoid colon.                           - External hemorrhoids.  Recommendations:    Past Medical History:  Diagnosis Date   Anxiety    Back pain    Cerebrovascular disease    NONOBSTRUCTIVE CVA BY CAROTID DOPPLERS OCTOBER 2008   Chronic renal insufficiency    GFR 45 ML'S PER MINUTE   COPD (chronic obstructive pulmonary disease) (HCC)    Coronary artery disease    a. s/p DES to RCA in 2007 b. patent stent by cath in 12/2014 with nonobstructive disease along LAD and RCA   Coronary atherosclerosis of native coronary artery    NEGATIVE LEXISCAN FOR ISCHEMIA MARCH 23,2010 NORMAL LV FUNCTTION   Depression    Dyspnea    with exertion   GERD (gastroesophageal reflux disease)  Headache    chronic headache from fall in December   Hyperlipidemia    Hypertension    Hypothyroidism    Other and unspecified angina pectoris    ANGINA   Overweight(278.02)    OBESITY   Peptic ulcer disease    Primary localized osteoarthritis of left knee    Sleep apnea    SEVERE OBSTRUCTIVE    cpap   Stroke [facility])    affected memory   Thyroid disease    TREATED HYPOTHYROIDISM    Past Surgical History:  Procedure Laterality Date   ABDOMINAL HYSTERECTOMY     BIOPSY  06/18/2021   Procedure: BIOPSY;  Surgeon: Malissa Hippo, MD;  [facility];  Service: Endoscopy;;   CARDIAC CATHETERIZATION N/A 01/22/2015   Procedure: Right/Left Heart Cath and Coronary Angiography;  Surgeon: Iran Ouch, MD;  [facility];  Service: Cardiovascular;  Laterality: N/A;   CARPAL TUNNEL RELEASE     CHOLECYSTECTOMY     COLONOSCOPY N/A 03/31/2018   Procedure: COLONOSCOPY;  Surgeon: Malissa Hippo, MD;  [facility];  Service: Endoscopy;   Laterality: N/A;  2:45   COLONOSCOPY WITH PROPOFOL N/A 06/18/2021   Procedure: COLONOSCOPY WITH PROPOFOL;  Surgeon: Malissa Hippo, MD;  [facility];  Service: Endoscopy;  Laterality: N/A;  11:00   JOINT REPLACEMENT     LEFT HEART CATH AND CORONARY ANGIOGRAPHY N/A 04/28/2018   Procedure: LEFT HEART CATH AND CORONARY ANGIOGRAPHY;  Surgeon: Lyn Records, MD;  [facility];  Service: Cardiovascular;  Laterality: N/A;   POLYPECTOMY  03/31/2018   Procedure: POLYPECTOMY;  Surgeon: Malissa Hippo, MD;  [facility];  Service: Endoscopy;;  splen. flex, cecum   POLYPECTOMY  06/18/2021   Procedure: POLYPECTOMY;  Surgeon: Malissa Hippo, MD;  [facility];  Service: Endoscopy;;   REPLACEMENT TOTAL KNEE Right 07/28/2010   ROTATOR CUFF REPAIR Right 07/08/2009   TOTAL ABDOMINAL HYSTERECTOMY W/ BILATERAL SALPINGOOPHORECTOMY     TOTAL KNEE ARTHROPLASTY Left 04/08/2015   Procedure: LEFT TOTAL KNEE ARTHROPLASTY;  Surgeon: Salvatore Marvel, MD;  Location: [facility] OR;  Service: Orthopedics;  Laterality: Left;   TOTAL SHOULDER ARTHROPLASTY Left 06/02/2017   Procedure: TOTAL SHOULDER ARTHROPLASTY;  Surgeon: Bjorn Pippin, MD;  [facility];  Service: Orthopedics;  Laterality: Left;    Current Outpatient Medications  Medication Sig Dispense Refill   acetaminophen (TYLENOL) 650 MG CR tablet Take 650 mg by mouth every 8 (eight) hours as needed for pain.     acyclovir (ZOVIRAX) 400 MG tablet Take 400 mg by mouth in the morning.     albuterol (VENTOLIN HFA) 108 (90 Base) MCG/ACT inhaler Inhale 2 puffs into the lungs every 6 (six) hours as needed for wheezing or shortness of breath. 1 each 2   amLODipine (NORVASC) 10 MG tablet Take 10 mg by mouth every morning.  3   aspirin EC 81 MG tablet Take 81 mg by mouth in the morning.     cetirizine (ZYRTEC) 10 MG tablet Take 10 mg by mouth as needed for allergies or rhinitis.     citalopram (CELEXA) 20 MG tablet Take 20 mg by mouth  every morning.     clopidogrel (PLAVIX) 75 MG tablet Take 1 tablet (75 mg total) by mouth every morning.     donepezil (ARICEPT) 10 MG tablet Take 10 mg by mouth daily.     hydrALAZINE (APRESOLINE) 50 MG tablet Take  1 tablet (50 mg total) by mouth 2 (two) times daily. 60 tablet 0   hyoscyamine (LEVBID) 0.375 MG 12 hr tablet TAKE 1 TABLET BY MOUTH DAILY 60 tablet 1   isosorbide mononitrate (IMDUR) 120 MG 24 hr tablet Take 1 tablet (120 mg total) by mouth in the morning and at bedtime. 180 tablet 3   lamoTRIgine 50 MG TBDP Take 25 mg by mouth 2 (two) times daily.     levothyroxine (SYNTHROID) 75 MCG tablet Take 75 mcg by mouth daily.     nitroGLYCERIN (NITROSTAT) 0.4 MG SL tablet Place 1 tablet (0.4 mg total) under the tongue every 5 (five) minutes x 3 doses as needed for chest pain (if no relief after 3rd dose, proceed to the ED for an evaluation). 25 tablet 3   ondansetron (ZOFRAN-ODT) 4 MG disintegrating tablet Take 1 tablet (4 mg total) by mouth every 8 (eight) hours as needed for nausea. 10 tablet 0   QUEtiapine (SEROQUEL) 25 MG tablet Take 25 mg by mouth at bedtime.     rosuvastatin (CRESTOR) 20 MG tablet TAKE 1 TABLET BY MOUTH EVERY DAY (Patient taking differently: Take 20 mg by mouth daily.) 90 tablet 1   No current facility-administered medications for this visit.    Allergies as of 11/26/2022 - Review Complete 11/26/2022  Allergen Reaction Noted   Ace inhibitors Cough 08/14/2008   Gemfibrozil Other (See Comments) 08/14/2008   Moxifloxacin Rash 08/14/2008   Ranexa [ranolazine] Other (See Comments) 11/10/2013    Family History  Problem Relation Age of Onset   CAD Other    Hypertension Other    Heart attack Other    Cancer Other    CAD Mother    Cancer Mother    CAD Father    Diabetes Sister    CAD Sister    Leukemia Brother     Social History   Socioeconomic History   Marital status: Married    Spouse name: Not on file   Number of children: Not on file   Years of  education: Not on file   Highest education level: Not on file  Occupational History   Not on file  Tobacco Use   Smoking status: Never   Smokeless tobacco: Never  Vaping Use   Vaping status: Never Used  Substance and Sexual Activity   Alcohol use: No    Alcohol/week: 0.0 standard drinks of alcohol   Drug use: No   Sexual activity: Never  Other Topics Concern   Not on file  Social History Narrative   Not on file   Social Determinants of Health   Financial Resource Strain: Not on file  Food Insecurity: No Food Insecurity (01/21/2022)   Hunger Vital Sign    Worried About Running Out of Food in the Last Year: Never true    Ran Out of Food in the Last Year: Never true  Transportation Needs: No Transportation Needs (01/21/2022)   PRAPARE - Administrator, Civil Service (Medical): No    Lack of Transportation (Non-Medical): No  Physical Activity: Not on file  Stress: Not on file  Social Connections: Not on file    Review of systems General: negative for malaise, night sweats, fever, chills, weight loss Neck: Negative for lumps, goiter, pain and significant neck swelling Resp: Negative for cough, wheezing, dyspnea at rest CV: Negative for chest pain, leg swelling, palpitations, orthopnea GI: denies melena, hematochezia, nausea, vomiting, diarrhea, constipation, dysphagia, odyonophagia, early satiety or unintentional weight loss.  MSK: Negative for joint pain or swelling, back pain, and muscle pain. Derm: Negative for itching or rash Psych: Denies depression, anxiety, memory loss, confusion. No homicidal or suicidal ideation.  Heme: Negative for prolonged bleeding, bruising easily, and swollen nodes. Endocrine: Negative for cold or heat intolerance, polyuria, polydipsia and goiter. Neuro: negative for tremor, gait imbalance, syncope and seizures. The remainder of the review of systems is noncontributory.  Physical Exam: BP 136/71 (BP Location: Right Arm, Patient  Position: Sitting, Cuff Size: Normal)   Pulse 65   Temp 98.1 F (36.7 C) (Oral)   Ht 5\' 2"  (1.575 m)   Wt 152 lb 4.8 oz (69.1 kg)   BMI 27.86 kg/m  General:   Alert and oriented. No distress noted. Pleasant and cooperative.  Head:  Normocephalic and atraumatic. Eyes:  Conjuctiva clear without scleral icterus. Mouth:  Oral mucosa pink and moist. Good dentition. No lesions. Heart: Normal rate and rhythm, s1 and s2 heart sounds present.  Lungs: Clear lung sounds in all lobes. Respirations equal and unlabored. Abdomen:  +BS, soft, non-tender and non-distended. No rebound or guarding. No HSM or masses noted. Derm: No palmar erythema or jaundice Msk:  Symmetrical without gross deformities. Normal posture. Extremities:  Without edema. Neurologic:  Alert and  oriented x4 Psych:  Alert and cooperative. Normal mood and affect.  Invalid input(s): "6 MONTHS"   ASSESSMENT: Annette Sharp is a 82 y.o. female presenting today for follow up of IBS, abdominal cramping.   No longer having constipation, some urgency but stools are solid. Abdominal pain is better, has this on rare occasion now. No rectal bleeding or melena. Appetite is still somewhat down but her weight appears stable at this time. She has no nausea, vomiting or early satiety. Unsure if she is taking hyoscyamine or not but advised she can continue with this if she feels it is providing good results. She should follow up with PCP regarding diffuse cramping.    PLAN:  Continue with hyoscyamine as needed for abdominal pain  2. Follow up with PCP regarding cramping  3. Good water intake   All questions were answered, patient verbalized understanding and is in agreement with plan as outlined above.   Follow Up: 1 year  Fox Salminen L. Jeanmarie Hubert, MSN, APRN, AGNP-C Adult-Gerontology Nurse Practitioner [facility]  I have reviewed the note and agree with the APP's assessment as described in this progress note  Katrinka Blazing, MD Gastroenterology and Hepatology [facility]

## 2022-11-27 ENCOUNTER — Telehealth (INDEPENDENT_AMBULATORY_CARE_PROVIDER_SITE_OTHER): Payer: Self-pay

## 2022-11-27 NOTE — Telephone Encounter (Signed)
error 

## 2022-12-01 DIAGNOSIS — E876 Hypokalemia: Secondary | ICD-10-CM | POA: Diagnosis not present

## 2022-12-01 DIAGNOSIS — K219 Gastro-esophageal reflux disease without esophagitis: Secondary | ICD-10-CM | POA: Diagnosis not present

## 2022-12-01 DIAGNOSIS — E039 Hypothyroidism, unspecified: Secondary | ICD-10-CM | POA: Diagnosis not present

## 2022-12-01 DIAGNOSIS — N1832 Chronic kidney disease, stage 3b: Secondary | ICD-10-CM | POA: Diagnosis not present

## 2022-12-03 DIAGNOSIS — R2689 Other abnormalities of gait and mobility: Secondary | ICD-10-CM | POA: Diagnosis not present

## 2022-12-03 DIAGNOSIS — R413 Other amnesia: Secondary | ICD-10-CM | POA: Diagnosis not present

## 2022-12-03 DIAGNOSIS — G40209 Localization-related (focal) (partial) symptomatic epilepsy and epileptic syndromes with complex partial seizures, not intractable, without status epilepticus: Secondary | ICD-10-CM | POA: Diagnosis not present

## 2022-12-07 DIAGNOSIS — F32A Depression, unspecified: Secondary | ICD-10-CM | POA: Diagnosis not present

## 2022-12-07 DIAGNOSIS — I700 Atherosclerosis of aorta: Secondary | ICD-10-CM | POA: Diagnosis not present

## 2022-12-07 DIAGNOSIS — N1832 Chronic kidney disease, stage 3b: Secondary | ICD-10-CM | POA: Diagnosis not present

## 2022-12-07 DIAGNOSIS — I880 Nonspecific mesenteric lymphadenitis: Secondary | ICD-10-CM | POA: Diagnosis not present

## 2022-12-10 DIAGNOSIS — E559 Vitamin D deficiency, unspecified: Secondary | ICD-10-CM | POA: Diagnosis not present

## 2022-12-10 DIAGNOSIS — E531 Pyridoxine deficiency: Secondary | ICD-10-CM | POA: Diagnosis not present

## 2022-12-10 DIAGNOSIS — E1140 Type 2 diabetes mellitus with diabetic neuropathy, unspecified: Secondary | ICD-10-CM | POA: Diagnosis not present

## 2022-12-10 DIAGNOSIS — G629 Polyneuropathy, unspecified: Secondary | ICD-10-CM | POA: Diagnosis not present

## 2022-12-10 DIAGNOSIS — G609 Hereditary and idiopathic neuropathy, unspecified: Secondary | ICD-10-CM | POA: Diagnosis not present

## 2022-12-10 DIAGNOSIS — E538 Deficiency of other specified B group vitamins: Secondary | ICD-10-CM | POA: Diagnosis not present

## 2022-12-31 DIAGNOSIS — H43393 Other vitreous opacities, bilateral: Secondary | ICD-10-CM | POA: Diagnosis not present

## 2022-12-31 DIAGNOSIS — H18593 Other hereditary corneal dystrophies, bilateral: Secondary | ICD-10-CM | POA: Diagnosis not present

## 2022-12-31 DIAGNOSIS — H524 Presbyopia: Secondary | ICD-10-CM | POA: Diagnosis not present

## 2023-01-08 ENCOUNTER — Emergency Department (HOSPITAL_COMMUNITY)
Admission: EM | Admit: 2023-01-08 | Discharge: 2023-01-09 | Disposition: A | Discharge status: Home / Self Care | Payer: Medicare Other | Attending: Emergency Medicine | Admitting: Emergency Medicine

## 2023-01-08 ENCOUNTER — Emergency Department (HOSPITAL_COMMUNITY): Payer: Medicare Other

## 2023-01-08 ENCOUNTER — Other Ambulatory Visit: Payer: Self-pay

## 2023-01-08 ENCOUNTER — Encounter (HOSPITAL_COMMUNITY): Payer: Self-pay

## 2023-01-08 DIAGNOSIS — M5126 Other intervertebral disc displacement, lumbar region: Secondary | ICD-10-CM | POA: Diagnosis not present

## 2023-01-08 DIAGNOSIS — Z7982 Long term (current) use of aspirin: Secondary | ICD-10-CM | POA: Diagnosis not present

## 2023-01-08 DIAGNOSIS — G4730 Sleep apnea, unspecified: Secondary | ICD-10-CM | POA: Diagnosis not present

## 2023-01-08 DIAGNOSIS — I129 Hypertensive chronic kidney disease with stage 1 through stage 4 chronic kidney disease, or unspecified chronic kidney disease: Secondary | ICD-10-CM | POA: Diagnosis not present

## 2023-01-08 DIAGNOSIS — M19012 Primary osteoarthritis, left shoulder: Secondary | ICD-10-CM | POA: Diagnosis not present

## 2023-01-08 DIAGNOSIS — S0990XA Unspecified injury of head, initial encounter: Secondary | ICD-10-CM | POA: Diagnosis not present

## 2023-01-08 DIAGNOSIS — S29012A Strain of muscle and tendon of back wall of thorax, initial encounter: Secondary | ICD-10-CM | POA: Diagnosis not present

## 2023-01-08 DIAGNOSIS — R519 Headache, unspecified: Secondary | ICD-10-CM | POA: Diagnosis not present

## 2023-01-08 DIAGNOSIS — S199XXA Unspecified injury of neck, initial encounter: Secondary | ICD-10-CM | POA: Diagnosis not present

## 2023-01-08 DIAGNOSIS — I2510 Atherosclerotic heart disease of native coronary artery without angina pectoris: Secondary | ICD-10-CM | POA: Diagnosis not present

## 2023-01-08 DIAGNOSIS — Z043 Encounter for examination and observation following other accident: Secondary | ICD-10-CM | POA: Diagnosis not present

## 2023-01-08 DIAGNOSIS — M25512 Pain in left shoulder: Secondary | ICD-10-CM | POA: Diagnosis not present

## 2023-01-08 DIAGNOSIS — S299XXA Unspecified injury of thorax, initial encounter: Secondary | ICD-10-CM | POA: Diagnosis not present

## 2023-01-08 DIAGNOSIS — I6529 Occlusion and stenosis of unspecified carotid artery: Secondary | ICD-10-CM | POA: Diagnosis not present

## 2023-01-08 DIAGNOSIS — N189 Chronic kidney disease, unspecified: Secondary | ICD-10-CM | POA: Insufficient documentation

## 2023-01-08 DIAGNOSIS — Z7902 Long term (current) use of antithrombotics/antiplatelets: Secondary | ICD-10-CM | POA: Insufficient documentation

## 2023-01-08 DIAGNOSIS — J449 Chronic obstructive pulmonary disease, unspecified: Secondary | ICD-10-CM | POA: Insufficient documentation

## 2023-01-08 DIAGNOSIS — Z79899 Other long term (current) drug therapy: Secondary | ICD-10-CM | POA: Insufficient documentation

## 2023-01-08 DIAGNOSIS — Z96612 Presence of left artificial shoulder joint: Secondary | ICD-10-CM | POA: Diagnosis not present

## 2023-01-08 DIAGNOSIS — K219 Gastro-esophageal reflux disease without esophagitis: Secondary | ICD-10-CM | POA: Insufficient documentation

## 2023-01-08 DIAGNOSIS — Z9889 Other specified postprocedural states: Secondary | ICD-10-CM | POA: Diagnosis not present

## 2023-01-08 DIAGNOSIS — S39012A Strain of muscle, fascia and tendon of lower back, initial encounter: Secondary | ICD-10-CM | POA: Insufficient documentation

## 2023-01-08 DIAGNOSIS — M51369 Other intervertebral disc degeneration, lumbar region without mention of lumbar back pain or lower extremity pain: Secondary | ICD-10-CM | POA: Diagnosis not present

## 2023-01-08 DIAGNOSIS — M25511 Pain in right shoulder: Secondary | ICD-10-CM | POA: Insufficient documentation

## 2023-01-08 DIAGNOSIS — Z96653 Presence of artificial knee joint, bilateral: Secondary | ICD-10-CM | POA: Diagnosis not present

## 2023-01-08 DIAGNOSIS — Y92000 Kitchen of unspecified non-institutional (private) residence as  the place of occurrence of the external cause: Secondary | ICD-10-CM | POA: Insufficient documentation

## 2023-01-08 DIAGNOSIS — M19011 Primary osteoarthritis, right shoulder: Secondary | ICD-10-CM | POA: Diagnosis not present

## 2023-01-08 DIAGNOSIS — W19XXXA Unspecified fall, initial encounter: Secondary | ICD-10-CM

## 2023-01-08 DIAGNOSIS — S161XXA Strain of muscle, fascia and tendon at neck level, initial encounter: Secondary | ICD-10-CM | POA: Diagnosis not present

## 2023-01-08 DIAGNOSIS — I10 Essential (primary) hypertension: Secondary | ICD-10-CM | POA: Insufficient documentation

## 2023-01-08 DIAGNOSIS — Z955 Presence of coronary angioplasty implant and graft: Secondary | ICD-10-CM | POA: Insufficient documentation

## 2023-01-08 DIAGNOSIS — I700 Atherosclerosis of aorta: Secondary | ICD-10-CM | POA: Diagnosis not present

## 2023-01-08 DIAGNOSIS — E785 Hyperlipidemia, unspecified: Secondary | ICD-10-CM | POA: Diagnosis not present

## 2023-01-08 DIAGNOSIS — I6782 Cerebral ischemia: Secondary | ICD-10-CM | POA: Diagnosis not present

## 2023-01-08 DIAGNOSIS — W01198A Fall on same level from slipping, tripping and stumbling with subsequent striking against other object, initial encounter: Secondary | ICD-10-CM | POA: Insufficient documentation

## 2023-01-08 DIAGNOSIS — E039 Hypothyroidism, unspecified: Secondary | ICD-10-CM | POA: Insufficient documentation

## 2023-01-08 NOTE — ED Triage Notes (Signed)
Wet floor in kitchen Feet came out from underneath pt Slipped fell on back and hit Denies LOC Takes plavix  Complains of head pain, B/L arm pain, neck and back pain

## 2023-01-08 NOTE — ED Provider Notes (Addendum)
[place] EMERGENCY DEPARTMENT AT [facility] Provider Note   CSN: [id number] Arrival date & time: 01/08/23  1713     History  Chief Complaint  Patient presents with   Annette Sharp is a 82 y.o. female.  Patient status post fall in the kitchen.  Shortly prior to arrival.  Patient slipped on a wet floor fell backwards striking the back of her head.  No loss of consciousness.  Patient takes Plavix.  Patient with a complaint of head pain neck pain thoracic back pain and lumbar back pain.  Also bilateral shoulder pain.  Husband was able to get her back up on her feet and she was able to bear her weight on her legs.  Patient's past medical history of hyperlipidemia hypertension cerebrovascular disease chronic renal insufficiency coronary artery disease status post stents.  Sleep apnea COPD hypothyroidism gastroesophageal reflux disease past surgical history significant for gallbladder removal replacement total knee on the right rotator cuff repair on the right total knee replacement on the left total shoulder arthroplasty on the left.  Left cath January 2020 is never used tobacco products.       Home Medications Prior to Admission medications   Medication Sig Start Date End Date Taking? Authorizing Provider  acetaminophen (TYLENOL) 650 MG CR tablet Take 650 mg by mouth every 8 (eight) hours as needed for pain.    [provider]  acyclovir (ZOVIRAX) 400 MG tablet Take 400 mg by mouth in the morning.    [provider]  albuterol (VENTOLIN HFA) 108 (90 Base) MCG/ACT inhaler Inhale 2 puffs into the lungs every 6 (six) hours as needed for wheezing or shortness of breath. 03/12/22   Antoine Poche, MD  amLODipine (NORVASC) 10 MG tablet Take 10 mg by mouth every morning. 05/04/15   [provider]  aspirin EC 81 MG tablet Take 81 mg by mouth in the morning. 04/03/18   Malissa Hippo, MD  cetirizine (ZYRTEC) 10 MG tablet Take 10 mg by mouth as  needed for allergies or rhinitis.    [provider]  citalopram (CELEXA) 20 MG tablet Take 20 mg by mouth every morning.    [provider]  clopidogrel (PLAVIX) 75 MG tablet Take 1 tablet (75 mg total) by mouth every morning. 06/19/21   Rehman, Joline Maxcy, MD  donepezil (ARICEPT) 10 MG tablet Take 10 mg by mouth daily. 05/07/22   [provider]  hydrALAZINE (APRESOLINE) 50 MG tablet Take 1 tablet (50 mg total) by mouth 2 (two) times daily. 05/01/22 11/26/22  Dorcas Carrow, MD  hyoscyamine (LEVBID) 0.375 MG 12 hr tablet TAKE 1 TABLET BY MOUTH DAILY 09/24/22   Carlan, Chelsea L, NP  isosorbide mononitrate (IMDUR) 120 MG 24 hr tablet Take 1 tablet (120 mg total) by mouth in the morning and at bedtime. 03/12/22   Antoine Poche, MD  lamoTRIgine 50 MG TBDP Take 25 mg by mouth 2 (two) times daily.    [provider]  levothyroxine (SYNTHROID) 75 MCG tablet Take 75 mcg by mouth daily. 12/03/20   [provider]  nitroGLYCERIN (NITROSTAT) 0.4 MG SL tablet Place 1 tablet (0.4 mg total) under the tongue every 5 (five) minutes x 3 doses as needed for chest pain (if no relief after 3rd dose, proceed to the ED for an evaluation). 02/08/18 06/14/23  Laqueta Linden, MD  ondansetron (ZOFRAN-ODT) 4 MG disintegrating tablet Take 1 tablet (4 mg total) by mouth  every 8 (eight) hours as needed for nausea. 05/09/22   Eber Hong, MD  QUEtiapine (SEROQUEL) 25 MG tablet Take 25 mg by mouth at bedtime. 05/04/22   [provider]  rosuvastatin (CRESTOR) 20 MG tablet TAKE 1 TABLET BY MOUTH EVERY DAY Patient taking differently: Take 20 mg by mouth daily. 04/28/21   Antoine Poche, MD      Allergies    Ace inhibitors, Gemfibrozil, Moxifloxacin, and Ranexa [ranolazine]    Review of Systems   Review of Systems  Physical Exam Updated Vital Signs BP (!) 136/53 (BP Location: Right Arm)   Pulse (!) 56   Temp 98 F (36.7 C) (Oral)   Resp 16   Ht 1.575 m (5\' 2" )    Wt 68.5 kg   SpO2 99%   BMI 27.62 kg/m  Physical Exam Vitals and nursing note reviewed.  Constitutional:      General: She is not in acute distress.    Appearance: Normal appearance. She is well-developed.  HENT:     Head: Normocephalic and atraumatic.     Mouth/Throat:     Mouth: Mucous membranes are moist.  Eyes:     Extraocular Movements: Extraocular movements intact.     Conjunctiva/sclera: Conjunctivae normal.     Pupils: Pupils are equal, round, and reactive to light.  Neck:     Comments: Tenderness to palpation posterior cervical spine Cardiovascular:     Rate and Rhythm: Normal rate and regular rhythm.     Heart sounds: No murmur heard. Pulmonary:     Effort: Pulmonary effort is normal. No respiratory distress.     Breath sounds: Normal breath sounds. No wheezing, rhonchi or rales.  Abdominal:     Palpations: Abdomen is soft.     Tenderness: There is no abdominal tenderness.  Musculoskeletal:        General: Tenderness present. No swelling.     Cervical back: Neck supple. Tenderness present.     Comments: Patient with tenderness to palpation thoracic spine mostly upper.  And upper lumbar spine.  Skin:    General: Skin is warm and dry.     Capillary Refill: Capillary refill takes less than 2 seconds.  Neurological:     General: No focal deficit present.     Mental Status: She is alert and oriented to person, place, and time.     Cranial Nerves: No cranial nerve deficit.     Motor: No weakness.     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  Psychiatric:        Mood and Affect: Mood normal.     ED Results / Procedures / Treatments   Labs (all labs ordered are listed, but only abnormal results are displayed) Labs Reviewed - No data to display  EKG None  Radiology No results found.  Procedures Procedures    Medications Ordered in ED Medications - No data to display  ED Course/ Medical Decision Making/ A&P                                  Medical Decision Making Amount and/or Complexity of Data Reviewed Radiology: ordered.   Patient status post fall.  No loss of consciousness.  Patient on Plavix.  Patient with complaint of bilateral shoulder pain head neck pain thoracic back pain lumbar back pain.  Will get CT head CT neck CT thoracic spine and CT CT lumbar  spine.  Will get x-rays of both shoulders and x-rays of both hips and pelvis.  X-ray of both hips and pelvis without any bony abnormalities.  X-rays of both shoulders without any bony abnormalities right shoulder replacement on the left side.  CT lumbar CT thoracic without any acute bony abnormalities.  CT head and neck without any acute bony abnormalities or any brain injury.  Treat patient symptomatically and have her follow-up with her orthopedic doctor.  Patient stable for discharge home.   Final Clinical Impression(s) / ED Diagnoses Final diagnoses:  Fall, initial encounter  Injury of head, initial encounter  Strain of neck muscle, initial encounter  Strain of thoracic back region  Strain of lumbar region, initial encounter    Rx / DC Orders ED Discharge Orders     None         Vanetta Mulders, MD 01/08/23 2002    Vanetta Mulders, MD 01/08/23 [phone]

## 2023-01-08 NOTE — Discharge Instructions (Addendum)
Workup here today without any acute bony abnormalities.  Would recommend following up with your orthopedic doctor.  And primary care doctor.  Return for any new or worse symptoms.  Expect to be sore and stiff the next few days.  Would recommend taking extra strength Tylenol 2 tablets every 8 hours.

## 2023-01-13 DIAGNOSIS — S40021A Contusion of right upper arm, initial encounter: Secondary | ICD-10-CM | POA: Diagnosis not present

## 2023-01-13 DIAGNOSIS — M545 Low back pain, unspecified: Secondary | ICD-10-CM | POA: Diagnosis not present

## 2023-01-13 DIAGNOSIS — Z6829 Body mass index (BMI) 29.0-29.9, adult: Secondary | ICD-10-CM | POA: Diagnosis not present

## 2023-01-15 DIAGNOSIS — H524 Presbyopia: Secondary | ICD-10-CM | POA: Diagnosis not present

## 2023-01-18 DIAGNOSIS — E039 Hypothyroidism, unspecified: Secondary | ICD-10-CM | POA: Diagnosis not present

## 2023-01-28 DIAGNOSIS — I10 Essential (primary) hypertension: Secondary | ICD-10-CM | POA: Diagnosis not present

## 2023-01-28 DIAGNOSIS — I700 Atherosclerosis of aorta: Secondary | ICD-10-CM | POA: Diagnosis not present

## 2023-01-28 DIAGNOSIS — R2689 Other abnormalities of gait and mobility: Secondary | ICD-10-CM | POA: Diagnosis not present

## 2023-01-28 DIAGNOSIS — R413 Other amnesia: Secondary | ICD-10-CM | POA: Diagnosis not present

## 2023-01-28 DIAGNOSIS — G40209 Localization-related (focal) (partial) symptomatic epilepsy and epileptic syndromes with complex partial seizures, not intractable, without status epilepticus: Secondary | ICD-10-CM | POA: Diagnosis not present

## 2023-01-28 DIAGNOSIS — G629 Polyneuropathy, unspecified: Secondary | ICD-10-CM | POA: Diagnosis not present

## 2023-02-09 DIAGNOSIS — H903 Sensorineural hearing loss, bilateral: Secondary | ICD-10-CM | POA: Diagnosis not present

## 2023-03-11 ENCOUNTER — Emergency Department (HOSPITAL_COMMUNITY)
Admission: EM | Admit: 2023-03-11 | Discharge: 2023-03-11 | Disposition: A | Discharge status: Home / Self Care | Payer: Medicare Other | Attending: Emergency Medicine | Admitting: Emergency Medicine

## 2023-03-11 ENCOUNTER — Emergency Department (HOSPITAL_COMMUNITY): Payer: Medicare Other

## 2023-03-11 ENCOUNTER — Other Ambulatory Visit: Payer: Self-pay

## 2023-03-11 ENCOUNTER — Encounter (HOSPITAL_COMMUNITY): Payer: Self-pay | Admitting: *Deleted

## 2023-03-11 DIAGNOSIS — R531 Weakness: Secondary | ICD-10-CM | POA: Insufficient documentation

## 2023-03-11 DIAGNOSIS — M19011 Primary osteoarthritis, right shoulder: Secondary | ICD-10-CM | POA: Diagnosis not present

## 2023-03-11 DIAGNOSIS — Y9301 Activity, walking, marching and hiking: Secondary | ICD-10-CM | POA: Insufficient documentation

## 2023-03-11 DIAGNOSIS — S199XXA Unspecified injury of neck, initial encounter: Secondary | ICD-10-CM | POA: Diagnosis not present

## 2023-03-11 DIAGNOSIS — W102XXA Fall (on)(from) incline, initial encounter: Secondary | ICD-10-CM | POA: Diagnosis not present

## 2023-03-11 DIAGNOSIS — Y92009 Unspecified place in unspecified non-institutional (private) residence as the place of occurrence of the external cause: Secondary | ICD-10-CM | POA: Diagnosis not present

## 2023-03-11 DIAGNOSIS — M7918 Myalgia, other site: Secondary | ICD-10-CM

## 2023-03-11 DIAGNOSIS — S0990XA Unspecified injury of head, initial encounter: Secondary | ICD-10-CM | POA: Diagnosis not present

## 2023-03-11 DIAGNOSIS — R519 Headache, unspecified: Secondary | ICD-10-CM | POA: Insufficient documentation

## 2023-03-11 DIAGNOSIS — Z7982 Long term (current) use of aspirin: Secondary | ICD-10-CM | POA: Diagnosis not present

## 2023-03-11 DIAGNOSIS — M791 Myalgia, unspecified site: Secondary | ICD-10-CM | POA: Diagnosis not present

## 2023-03-11 DIAGNOSIS — M25511 Pain in right shoulder: Secondary | ICD-10-CM | POA: Diagnosis not present

## 2023-03-11 DIAGNOSIS — M25519 Pain in unspecified shoulder: Secondary | ICD-10-CM | POA: Diagnosis not present

## 2023-03-11 DIAGNOSIS — W19XXXA Unspecified fall, initial encounter: Secondary | ICD-10-CM

## 2023-03-11 MED ORDER — ACETAMINOPHEN 325 MG PO TABS
650.0000 mg | ORAL_TABLET | Freq: Once | ORAL | Status: AC
  Administered 2023-03-11: 650 mg via ORAL
  Filled 2023-03-11: qty 2

## 2023-03-11 NOTE — Discharge Instructions (Signed)
Today were seen for musculoskeletal pain after a fall.  You may take over-the-counter Tylenol for pain as needed.  Thank you for letting us treat you today. After performing a physical exam and reviewing your imaging, I feel you are safe to go home. Please follow up with your PCP in the next several days and provide them with your records from this visit. Return to the Emergency Room if pain becomes severe or symptoms worsen.

## 2023-03-11 NOTE — ED Notes (Signed)
Patient transported to CT 

## 2023-03-11 NOTE — ED Triage Notes (Addendum)
Pt fell Tuesday while going down a ramp, it was raining and she slipped, falling on her back and hit her head. Denies LOC.  Pt states she takes Plavix. Pt states she has some confusion and mild dizziness.  She about fell in the lobby reported by the tech in triage. Husband states pt sees [facility] Footman, [place], [place] for her dementia and seizures.

## 2023-03-11 NOTE — ED Provider Notes (Signed)
[place] EMERGENCY DEPARTMENT AT [facility] Provider Note   CSN: [id number] Arrival date & time: 03/11/23  1142     History  Chief Complaint  Patient presents with   Marletta Lor    Annette Sharp is a 82 y.o. female presents today after falling Tuesday while going down a ramp at home.  Patient states it was raining and she slipped and fell falling on her back and hit her head.  Patient denies loss of consciousness, vomiting, nausea, vision changes, tinnitus, numbness, weakness, loss of bowel or bladder, shortness of breath, or chest pain.  Patient does state head and C-spine pain without radiation.  Patient does take Plavix.   Fall Associated symptoms include headaches.       Home Medications Prior to Admission medications   Medication Sig Start Date End Date Taking? Authorizing Provider  acetaminophen (TYLENOL) 650 MG CR tablet Take 650 mg by mouth every 8 (eight) hours as needed for pain.    [provider]  acyclovir (ZOVIRAX) 400 MG tablet Take 400 mg by mouth in the morning.    [provider]  albuterol (VENTOLIN HFA) 108 (90 Base) MCG/ACT inhaler Inhale 2 puffs into the lungs every 6 (six) hours as needed for wheezing or shortness of breath. 03/12/22   Antoine Poche, MD  amLODipine (NORVASC) 10 MG tablet Take 10 mg by mouth every morning. 05/04/15   [provider]  aspirin EC 81 MG tablet Take 81 mg by mouth in the morning. 04/03/18   Malissa Hippo, MD  cetirizine (ZYRTEC) 10 MG tablet Take 10 mg by mouth as needed for allergies or rhinitis.    [provider]  citalopram (CELEXA) 20 MG tablet Take 20 mg by mouth every morning.    [provider]  clopidogrel (PLAVIX) 75 MG tablet Take 1 tablet (75 mg total) by mouth every morning. 06/19/21   Rehman, Joline Maxcy, MD  donepezil (ARICEPT) 10 MG tablet Take 10 mg by mouth daily. 05/07/22   [provider]  hydrALAZINE (APRESOLINE) 50 MG tablet Take 1 tablet (50 mg  total) by mouth 2 (two) times daily. 05/01/22 11/26/22  Dorcas Carrow, MD  hyoscyamine (LEVBID) 0.375 MG 12 hr tablet TAKE 1 TABLET BY MOUTH DAILY 09/24/22   Carlan, Chelsea L, NP  isosorbide mononitrate (IMDUR) 120 MG 24 hr tablet Take 1 tablet (120 mg total) by mouth in the morning and at bedtime. 03/12/22   Antoine Poche, MD  lamoTRIgine 50 MG TBDP Take 25 mg by mouth 2 (two) times daily.    [provider]  levothyroxine (SYNTHROID) 75 MCG tablet Take 75 mcg by mouth daily. 12/03/20   [provider]  nitroGLYCERIN (NITROSTAT) 0.4 MG SL tablet Place 1 tablet (0.4 mg total) under the tongue every 5 (five) minutes x 3 doses as needed for chest pain (if no relief after 3rd dose, proceed to the ED for an evaluation). 02/08/18 06/14/23  Laqueta Linden, MD  ondansetron (ZOFRAN-ODT) 4 MG disintegrating tablet Take 1 tablet (4 mg total) by mouth every 8 (eight) hours as needed for nausea. 05/09/22   Eber Hong, MD  QUEtiapine (SEROQUEL) 25 MG tablet Take 25 mg by mouth at bedtime. 05/04/22   [provider]  rosuvastatin (CRESTOR) 20 MG tablet TAKE 1 TABLET BY MOUTH EVERY DAY Patient taking differently: Take 20 mg by mouth daily. 04/28/21   Antoine Poche, MD      Allergies    Ace inhibitors,  Gemfibrozil, Moxifloxacin, and Ranexa [ranolazine]    Review of Systems   Review of Systems  Musculoskeletal:  Positive for arthralgias and neck pain.  Neurological:  Positive for headaches.    Physical Exam Updated Vital Signs BP (!) 141/67 (BP Location: Left Arm)   Pulse 67   Temp 98.6 F (37 C) (Oral)   Resp 18   Ht 5\' 2"  (1.575 m)   Wt 65.8 kg   SpO2 99%   BMI 26.52 kg/m  Physical Exam Vitals and nursing note reviewed.  Constitutional:      General: She is not in acute distress.    Appearance: Normal appearance. She is well-developed.  HENT:     Head: Normocephalic and atraumatic. No raccoon eyes or Battle's sign.      Comments: Mild swelling and  tenderness to patient's right occiput without obvious abrasion or ecchymosis.    Right Ear: External ear normal.     Left Ear: External ear normal.     Nose: Nose normal.  Eyes:     Extraocular Movements: Extraocular movements intact.     Conjunctiva/sclera: Conjunctivae normal.     Pupils: Pupils are equal, round, and reactive to light.  Neck:     Comments: Tenderness to palpation of the right paraspinal muscles and trapezius muscle.  No midline tenderness, ecchymosis, step-offs, or obvious deformity. Cardiovascular:     Rate and Rhythm: Normal rate and regular rhythm.     Heart sounds: No murmur heard. Pulmonary:     Effort: Pulmonary effort is normal. No respiratory distress.     Breath sounds: Normal breath sounds.  Abdominal:     Palpations: Abdomen is soft.     Tenderness: There is no abdominal tenderness.  Musculoskeletal:        General: No swelling.     Cervical back: Normal range of motion and neck supple.  Skin:    General: Skin is warm and dry.     Capillary Refill: Capillary refill takes less than 2 seconds.  Neurological:     General: No focal deficit present.     Mental Status: She is alert.     Comments: Mild weakness to left hand grip strength when compared to right which patient states is chronic.  Psychiatric:        Mood and Affect: Mood normal.     ED Results / Procedures / Treatments   Labs (all labs ordered are listed, but only abnormal results are displayed) Labs Reviewed - No data to display  EKG None  Radiology DG Clavicle Right Result Date: 03/11/2023 CLINICAL DATA:  Shoulder pain since falling 2 days ago. EXAM: RIGHT CLAVICLE - 2+ VIEWS COMPARISON:  Radiographs 01/08/2023. FINDINGS: The bones are mildly demineralized. No evidence of acute fracture or dislocation. Stable mild acromioclavicular and glenohumeral degenerative changes. The visualized turning clavicular joints appear unremarkable. Aortic atherosclerosis noted. IMPRESSION: No evidence  of acute fracture or dislocation. Mild degenerative changes. Electronically Signed   By: Carey Bullocks M.D.   On: 03/11/2023 14:40   CT Head Wo Contrast Result Date: 03/11/2023 CLINICAL DATA:  Head trauma, minor (Age >= 65y); Neck trauma (Age >= 65y). Fall with head strike on Plavix. EXAM: CT HEAD WITHOUT CONTRAST CT CERVICAL SPINE WITHOUT CONTRAST TECHNIQUE: Multidetector CT imaging of the head and cervical spine was performed following the standard protocol without intravenous contrast. Multiplanar CT image reconstructions of the cervical spine were also generated. RADIATION DOSE REDUCTION: This exam was performed according to the departmental dose-optimization program  which includes automated exposure control, adjustment of the mA and/or kV according to patient size and/or use of iterative reconstruction technique. COMPARISON:  CT head and cervical spine 01/08/2023. FINDINGS: CT HEAD FINDINGS Brain: No acute hemorrhage. 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 Cortical gray-white differentiation is otherwise preserved. Prominence of the ventricles and sulci within expected range for age. No hydrocephalus or extra-axial collection. No mass effect or midline shift. Vascular: No hyperdense vessel or unexpected calcification. Skull: No calvarial fracture or suspicious bone lesion. Skull base is unremarkable. Sinuses/Orbits: No acute finding. Other: None. CT CERVICAL SPINE FINDINGS Alignment: Normal. Skull base and vertebrae: No acute fracture. Normal craniocervical junction. No suspicious bone lesions. Soft tissues and spinal canal: No prevertebral fluid or swelling. No visible canal hematoma. Disc levels: Unchanged mild cervical spondylosis with at least mild spinal canal stenosis at C5-6. Upper chest: No acute findings. Other: Atherosclerotic calcifications of the carotid bulbs. IMPRESSION: 1. No acute intracranial abnormality. 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 2. No acute cervical spine fracture or traumatic listhesis. Electronically Signed   By: Orvan Falconer M.D.   On: 03/11/2023 13:49   CT Cervical Spine Wo Contrast Result Date: 03/11/2023 CLINICAL DATA:  Head trauma, minor (Age >= 65y); Neck trauma (Age >= 65y). Fall with head strike on Plavix. EXAM: CT HEAD WITHOUT CONTRAST CT CERVICAL SPINE WITHOUT CONTRAST TECHNIQUE: Multidetector CT imaging of the head and cervical spine was performed following the standard protocol without intravenous contrast. Multiplanar CT image reconstructions of the cervical spine were also generated. RADIATION DOSE REDUCTION: This exam was performed according to the departmental dose-optimization program which includes automated exposure control, adjustment of the mA and/or kV according to patient size and/or use of iterative reconstruction technique. COMPARISON:  CT head and cervical spine 01/08/2023. FINDINGS: CT HEAD FINDINGS Brain: No acute hemorrhage. 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 Cortical gray-white differentiation is otherwise preserved. Prominence of the ventricles and sulci within expected range for age. No hydrocephalus or extra-axial collection. No mass effect or midline shift. Vascular: No hyperdense vessel or unexpected calcification. Skull: No calvarial fracture or suspicious bone lesion. Skull base is unremarkable. Sinuses/Orbits: No acute finding. Other: None. CT CERVICAL SPINE FINDINGS Alignment: Normal. Skull base and vertebrae: No acute fracture. Normal craniocervical junction. No suspicious bone lesions. Soft tissues and spinal canal: No prevertebral fluid or swelling. No visible canal hematoma. Disc levels: Unchanged mild cervical spondylosis with at least mild spinal canal stenosis at C5-6. Upper chest: No acute findings. Other: Atherosclerotic calcifications of the carotid bulbs.  IMPRESSION: 1. No acute intracranial abnormality. 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 2. No acute cervical spine fracture or traumatic listhesis. Electronically Signed   By: Orvan Falconer M.D.   On: 03/11/2023 13:49    Procedures Procedures    Medications Ordered in ED Medications  acetaminophen (TYLENOL) tablet 650 mg (650 mg Oral Given 03/11/23 1355)    ED Course/ Medical Decision Making/ A&P                                 Medical Decision Making  This patient presents to the ED with chief complaint(s) of fall with pertinent past medical history of thinner use which further complicates the presenting complaint. The complaint involves an extensive differential diagnosis and also carries with it a high  risk of complications and morbidity.    The differential diagnosis includes brain bleed, musculoskeletal pain, C-spine injury  Additional history obtained: Additional history obtained from spouse  ED Course and Reassessment:   Independent visualization of imaging: - I independently visualized the following imaging with scope of interpretation limited to determining acute life threatening conditions related to emergency care:  CT head and C-spine Noncon: No acute intracranial abnormality, no acute cervical C-spine fracture or traumatic listhesis Right clavicle x-ray: No acute fracture or dislocation  Consultation: - Consulted or discussed management/test interpretation w/ external professional: None  Consideration for admission or further workup: Considered for admission or further workup however patient vital signs, physical exam, and imaging of open reassuring.  Patient's symptoms likely due to musculoskeletal pain after fall.  Patient will be treated outpatient with over-the-counter pain control.         Final Clinical Impression(s) / ED Diagnoses Final diagnoses:  Fall, initial encounter  Musculoskeletal pain    Rx /  DC Orders ED Discharge Orders     None         Dolphus Jenny, PA-C 03/11/23 1455    Terrilee Files, MD 03/12/23 1724

## 2023-03-16 ENCOUNTER — Other Ambulatory Visit: Payer: Self-pay | Admitting: Cardiology

## 2023-03-17 DIAGNOSIS — W19XXXA Unspecified fall, initial encounter: Secondary | ICD-10-CM | POA: Diagnosis not present

## 2023-03-17 DIAGNOSIS — S161XXA Strain of muscle, fascia and tendon at neck level, initial encounter: Secondary | ICD-10-CM | POA: Diagnosis not present

## 2023-03-17 DIAGNOSIS — Z6830 Body mass index (BMI) 30.0-30.9, adult: Secondary | ICD-10-CM | POA: Diagnosis not present

## 2023-03-17 DIAGNOSIS — S060X0A Concussion without loss of consciousness, initial encounter: Secondary | ICD-10-CM | POA: Diagnosis not present

## 2023-04-07 DIAGNOSIS — E7849 Other hyperlipidemia: Secondary | ICD-10-CM | POA: Diagnosis not present

## 2023-04-07 DIAGNOSIS — E039 Hypothyroidism, unspecified: Secondary | ICD-10-CM | POA: Diagnosis not present

## 2023-04-07 DIAGNOSIS — I259 Chronic ischemic heart disease, unspecified: Secondary | ICD-10-CM | POA: Diagnosis not present

## 2023-04-07 DIAGNOSIS — N183 Chronic kidney disease, stage 3 unspecified: Secondary | ICD-10-CM | POA: Diagnosis not present

## 2023-04-13 DIAGNOSIS — Z6830 Body mass index (BMI) 30.0-30.9, adult: Secondary | ICD-10-CM | POA: Diagnosis not present

## 2023-04-13 DIAGNOSIS — R030 Elevated blood-pressure reading, without diagnosis of hypertension: Secondary | ICD-10-CM | POA: Diagnosis not present

## 2023-04-13 DIAGNOSIS — R233 Spontaneous ecchymoses: Secondary | ICD-10-CM | POA: Diagnosis not present

## 2023-04-13 DIAGNOSIS — N1832 Chronic kidney disease, stage 3b: Secondary | ICD-10-CM | POA: Diagnosis not present

## 2023-04-13 DIAGNOSIS — I259 Chronic ischemic heart disease, unspecified: Secondary | ICD-10-CM | POA: Diagnosis not present

## 2023-04-13 DIAGNOSIS — G309 Alzheimer's disease, unspecified: Secondary | ICD-10-CM | POA: Diagnosis not present

## 2023-04-13 DIAGNOSIS — Z23 Encounter for immunization: Secondary | ICD-10-CM | POA: Diagnosis not present

## 2023-05-25 ENCOUNTER — Ambulatory Visit (INDEPENDENT_AMBULATORY_CARE_PROVIDER_SITE_OTHER): Payer: Medicare Other | Admitting: Gastroenterology

## 2023-05-25 VITALS — BP 136/72 | HR 63 | Temp 97.3°F | Ht 62.0 in | Wt 168.5 lb

## 2023-05-25 DIAGNOSIS — K589 Irritable bowel syndrome without diarrhea: Secondary | ICD-10-CM

## 2023-05-25 DIAGNOSIS — K581 Irritable bowel syndrome with constipation: Secondary | ICD-10-CM

## 2023-05-25 NOTE — Patient Instructions (Signed)
 I'm glad you are feeling better We will plan to see you back in 1 year, if you have new or worsening GI issues, please let me know!

## 2023-05-25 NOTE — Progress Notes (Signed)
 Referring Provider: Estanislado Pandy, MD Primary Care Physician:  Estanislado Pandy, MD Primary GI Physician: Dr. Levon Hedger   Chief Complaint  Patient presents with   Irritable Bowel Syndrome    Follow up on IBS. Takes hysocamine for cramping.    HPI:   Annette Sharp is a 83 y.o. female with past medical history of anxiety, COPD, CAD, depression, GERD, HLD, HTN, hypothyroidism, PUD, sleep apnea, stroke, IBS    Patient presenting today for follow up of IBS  Last seen August 2024, at that time patient reported cramping all over her body to include her head.  Taking a spoonful of mustard which seems to help.  She also notes having episodes of feeling hot in her upper body.  Feeling constipation is improved with taking MiraLAX.  Sometimes has some fecal urgency.  Has had some fluctuation of her weight over the past year.  Doing 2 ensures a day.  Patient thought she was probably taking hyoscyamine daily but was unsure.  Patient recommended to continue hyoscyamine as needed for abdominal pain, follow-up with PCP regarding diffuse cramping, good water intake  Present: She has no GI complaints today. Thinks she is taking levbid PRN but endorses rare abdominal cramping. She is having a BM daily without constipation or diarrhea. She continues to have cramping to her arms and legs, doing muscle relaxers and mustard which seems to help. She notes that magnesium and potassium were normal, they were unsure the cause of her symptoms. She is doing 1 ensure daily. No blood in stools or black stools. She reports appetite is not good but she has actually gained weight.    Last Colonoscopy::05/2021 - One small polyp at the hepatic flexure, removed                            with a cold snare. Resected and retrieved.                           - One small polyp at the splenic flexure. Biopsied.                           - Diverticulosis in the sigmoid colon.                           - External  hemorrhoids.   Past Medical History:  Diagnosis Date   Anxiety    Back pain    Cerebrovascular disease    NONOBSTRUCTIVE CVA BY CAROTID DOPPLERS OCTOBER 2008   Chronic renal insufficiency    GFR 45 ML'S PER MINUTE   COPD (chronic obstructive pulmonary disease) (HCC)    Coronary artery disease    a. s/p DES to RCA in 2007 b. patent stent by cath in 12/2014 with nonobstructive disease along LAD and RCA   Coronary atherosclerosis of native coronary artery    NEGATIVE LEXISCAN FOR ISCHEMIA MARCH 23,2010 NORMAL LV FUNCTTION   Depression    Dyspnea    with exertion   GERD (gastroesophageal reflux disease)    Headache    chronic headache from fall in December   Hyperlipidemia    Hypertension    Hypothyroidism    Other and unspecified angina pectoris    ANGINA   Overweight(278.02)    OBESITY   Peptic ulcer disease    Primary localized  osteoarthritis of left knee    Sleep apnea    SEVERE OBSTRUCTIVE    cpap   Stroke [facility])    affected memory   Thyroid disease    TREATED HYPOTHYROIDISM    Past Surgical History:  Procedure Laterality Date   ABDOMINAL HYSTERECTOMY     BIOPSY  06/18/2021   Procedure: BIOPSY;  Surgeon: Malissa Hippo, MD;  Location: [facility];  Service: Endoscopy;;   CARDIAC CATHETERIZATION N/A 01/22/2015   Procedure: Right/Left Heart Cath and Coronary Angiography;  Surgeon: Iran Ouch, MD;  Location: [facility];  Service: Cardiovascular;  Laterality: N/A;   CARPAL TUNNEL RELEASE     CHOLECYSTECTOMY     COLONOSCOPY N/A 03/31/2018   Procedure: COLONOSCOPY;  Surgeon: Malissa Hippo, MD;  Location: [facility];  Service: Endoscopy;  Laterality: N/A;  2:45   COLONOSCOPY WITH PROPOFOL N/A 06/18/2021   Procedure: COLONOSCOPY WITH PROPOFOL;  Surgeon: Malissa Hippo, MD;  Location: [facility];  Service: Endoscopy;  Laterality: N/A;  11:00   JOINT REPLACEMENT     LEFT HEART CATH AND CORONARY ANGIOGRAPHY N/A 04/28/2018   Procedure: LEFT HEART CATH  AND CORONARY ANGIOGRAPHY;  Surgeon: Lyn Records, MD;  Location: [facility];  Service: Cardiovascular;  Laterality: N/A;   POLYPECTOMY  03/31/2018   Procedure: POLYPECTOMY;  Surgeon: Malissa Hippo, MD;  Location: [facility];  Service: Endoscopy;;  splen. flex, cecum   POLYPECTOMY  06/18/2021   Procedure: POLYPECTOMY;  Surgeon: Malissa Hippo, MD;  Location: [facility];  Service: Endoscopy;;   REPLACEMENT TOTAL KNEE Right 07/28/2010   ROTATOR CUFF REPAIR Right 07/08/2009   TOTAL ABDOMINAL HYSTERECTOMY W/ BILATERAL SALPINGOOPHORECTOMY     TOTAL KNEE ARTHROPLASTY Left 04/08/2015   Procedure: LEFT TOTAL KNEE ARTHROPLASTY;  Surgeon: Salvatore Marvel, MD;  Location: [facility];  Service: Orthopedics;  Laterality: Left;   TOTAL SHOULDER ARTHROPLASTY Left 06/02/2017   Procedure: TOTAL SHOULDER ARTHROPLASTY;  Surgeon: Bjorn Pippin, MD;  Location: [facility] OR;  Service: Orthopedics;  Laterality: Left;    Current Outpatient Medications  Medication Sig Dispense Refill   acetaminophen (TYLENOL) 650 MG CR tablet Take 650 mg by mouth every 8 (eight) hours as needed for pain.     acyclovir (ZOVIRAX) 400 MG tablet Take 400 mg by mouth in the morning.     albuterol (VENTOLIN HFA) 108 (90 Base) MCG/ACT inhaler Inhale 2 puffs into the lungs every 6 (six) hours as needed for wheezing or shortness of breath. 1 each 2   amLODipine (NORVASC) 10 MG tablet Take 10 mg by mouth every morning.  3   aspirin EC 81 MG tablet Take 81 mg by mouth in the morning.     cetirizine (ZYRTEC) 10 MG tablet Take 10 mg by mouth as needed for allergies or rhinitis.     citalopram (CELEXA) 20 MG tablet Take 20 mg by mouth every morning.     clopidogrel (PLAVIX) 75 MG tablet Take 1 tablet (75 mg total) by mouth every morning.     donepezil (ARICEPT) 10 MG tablet Take 10 mg by mouth daily.     hydrALAZINE (APRESOLINE) 50 MG tablet Take 1 tablet (50 mg total) by mouth 2 (two) times daily. 60 tablet 0   hyoscyamine (LEVBID) 0.375 MG 12 hr  tablet TAKE 1 TABLET BY MOUTH DAILY 60 tablet 1   isosorbide mononitrate (IMDUR) 120 MG 24 hr tablet TAKE 1 TABLET BY MOUTH TWICE DAILY (TAKE  1 TABLET IN THE MORNING AND TAKE 1 TABLET IN THE EVENING) 180 tablet 0   lamoTRIgine 50 MG TBDP Take 25 mg by mouth 2 (two) times daily.     levothyroxine (SYNTHROID) 75 MCG tablet Take 75 mcg by mouth daily.     nitroGLYCERIN (NITROSTAT) 0.4 MG SL tablet Place 1 tablet (0.4 mg total) under the tongue every 5 (five) minutes x 3 doses as needed for chest pain (if no relief after 3rd dose, proceed to the ED for an evaluation). 25 tablet 3   ondansetron (ZOFRAN-ODT) 4 MG disintegrating tablet Take 1 tablet (4 mg total) by mouth every 8 (eight) hours as needed for nausea. 10 tablet 0   QUEtiapine (SEROQUEL) 25 MG tablet Take 25 mg by mouth at bedtime.     rosuvastatin (CRESTOR) 20 MG tablet TAKE 1 TABLET BY MOUTH EVERY DAY (Patient taking differently: Take 20 mg by mouth daily.) 90 tablet 1   No current facility-administered medications for this visit.    Allergies as of 05/25/2023 - Review Complete 05/25/2023  Allergen Reaction Noted   Ace inhibitors Cough 08/14/2008   Gemfibrozil Other (See Comments) 08/14/2008   Moxifloxacin Rash 08/14/2008   Ranexa [ranolazine] Other (See Comments) 11/10/2013    Family History  Problem Relation Age of Onset   CAD Other    Hypertension Other    Heart attack Other    Cancer Other    CAD Mother    Cancer Mother    CAD Father    Diabetes Sister    CAD Sister    Leukemia Brother     Social History   Socioeconomic History   Marital status: Married    Spouse name: Not on file   Number of children: Not on file   Years of education: Not on file   Highest education level: Not on file  Occupational History   Not on file  Tobacco Use   Smoking status: Never   Smokeless tobacco: Never  Vaping Use   Vaping status: Never Used  Substance and Sexual Activity   Alcohol use: No    Alcohol/week: 0.0 standard  drinks of alcohol   Drug use: No   Sexual activity: Never  Other Topics Concern   Not on file  Social History Narrative   Not on file   Social Drivers of Health   Financial Resource Strain: Not on file  Food Insecurity: No Food Insecurity (01/21/2022)   Hunger Vital Sign    Worried About Running Out of Food in the Last Year: Never true    Ran Out of Food in the Last Year: Never true  Transportation Needs: No Transportation Needs (01/21/2022)   PRAPARE - Administrator, Civil Service (Medical): No    Lack of Transportation (Non-Medical): No  Physical Activity: Not on file  Stress: Not on file  Social Connections: Not on file    Review of systems General: negative for malaise, night sweats, fever, chills, weight loss Neck: Negative for lumps, goiter, pain and significant neck swelling Resp: Negative for cough, wheezing, dyspnea at rest CV: Negative for chest pain, leg swelling, palpitations, orthopnea GI: denies melena, hematochezia, nausea, vomiting, diarrhea, constipation, dysphagia, odyonophagia, early satiety or unintentional weight loss.  The remainder of the review of systems is noncontributory.  Physical Exam: BP 136/72 Comment: large cuff  Pulse 63   Temp (!) 97.3 F (36.3 C)   Ht 5\' 2"  (1.575 m)   Wt 168 lb 8 oz (76.4 kg)  BMI 30.82 kg/m  General:   Alert and oriented. No distress noted. Pleasant and cooperative.  Head:  Normocephalic and atraumatic. Eyes:  Conjuctiva clear without scleral icterus. Mouth:  Oral mucosa pink and moist. Good dentition. No lesions. Heart: Normal rate and rhythm, s1 and s2 heart sounds present.  Lungs: Clear lung sounds in all lobes. Respirations equal and unlabored. Abdomen:  +BS, soft, non-tender and non-distended. No rebound or guarding. No HSM or masses noted. Neurologic:  Alert and  oriented x4 Psych:  Alert and cooperative. Normal mood and affect.  Invalid input(s): "6 MONTHS"   ASSESSMENT: Annette Sharp is a  83 y.o. female presenting today for follow up of IBS   Patient reports no issues with constipation, having a bowel movement daily.  Having very rare abdominal pain and thinks she is taking Levbid on occasion when this occurs.  She does note she continues to have some diffuse body cramping though her PCP has started her on something that helps with this.  She has no GI complaints today.  Can continue Levbid as needed for abdominal cramping   PLAN:  -continue levbid PRN  All questions were answered, patient verbalized understanding and is in agreement with plan as outlined above.    Follow Up: 1 year   Marshall Roehrich L. Jeanmarie Hubert, MSN, APRN, AGNP-C Adult-Gerontology Nurse Practitioner [facility]  I have reviewed the note and agree with the APP's assessment as described in this progress note  Katrinka Blazing, MD Gastroenterology and Hepatology [facility]

## 2023-05-26 DIAGNOSIS — D485 Neoplasm of uncertain behavior of skin: Secondary | ICD-10-CM | POA: Diagnosis not present

## 2023-05-26 DIAGNOSIS — L821 Other seborrheic keratosis: Secondary | ICD-10-CM | POA: Diagnosis not present

## 2023-05-26 DIAGNOSIS — L570 Actinic keratosis: Secondary | ICD-10-CM | POA: Diagnosis not present

## 2023-05-28 DIAGNOSIS — E782 Mixed hyperlipidemia: Secondary | ICD-10-CM | POA: Diagnosis not present

## 2023-05-28 DIAGNOSIS — R569 Unspecified convulsions: Secondary | ICD-10-CM | POA: Diagnosis not present

## 2023-05-28 DIAGNOSIS — G309 Alzheimer's disease, unspecified: Secondary | ICD-10-CM | POA: Diagnosis not present

## 2023-05-28 DIAGNOSIS — J449 Chronic obstructive pulmonary disease, unspecified: Secondary | ICD-10-CM | POA: Diagnosis not present

## 2023-06-09 ENCOUNTER — Other Ambulatory Visit: Payer: Self-pay | Admitting: Cardiology

## 2023-06-28 DIAGNOSIS — J449 Chronic obstructive pulmonary disease, unspecified: Secondary | ICD-10-CM | POA: Diagnosis not present

## 2023-06-28 DIAGNOSIS — E782 Mixed hyperlipidemia: Secondary | ICD-10-CM | POA: Diagnosis not present

## 2023-06-28 DIAGNOSIS — R569 Unspecified convulsions: Secondary | ICD-10-CM | POA: Diagnosis not present

## 2023-06-28 DIAGNOSIS — G309 Alzheimer's disease, unspecified: Secondary | ICD-10-CM | POA: Diagnosis not present

## 2023-06-30 DIAGNOSIS — G629 Polyneuropathy, unspecified: Secondary | ICD-10-CM | POA: Diagnosis not present

## 2023-06-30 DIAGNOSIS — G40209 Localization-related (focal) (partial) symptomatic epilepsy and epileptic syndromes with complex partial seizures, not intractable, without status epilepticus: Secondary | ICD-10-CM | POA: Diagnosis not present

## 2023-06-30 DIAGNOSIS — R413 Other amnesia: Secondary | ICD-10-CM | POA: Diagnosis not present

## 2023-07-28 DIAGNOSIS — G309 Alzheimer's disease, unspecified: Secondary | ICD-10-CM | POA: Diagnosis not present

## 2023-07-28 DIAGNOSIS — E782 Mixed hyperlipidemia: Secondary | ICD-10-CM | POA: Diagnosis not present

## 2023-07-28 DIAGNOSIS — J449 Chronic obstructive pulmonary disease, unspecified: Secondary | ICD-10-CM | POA: Diagnosis not present

## 2023-07-28 DIAGNOSIS — R569 Unspecified convulsions: Secondary | ICD-10-CM | POA: Diagnosis not present

## 2023-08-27 DIAGNOSIS — J449 Chronic obstructive pulmonary disease, unspecified: Secondary | ICD-10-CM | POA: Diagnosis not present

## 2023-08-27 DIAGNOSIS — E782 Mixed hyperlipidemia: Secondary | ICD-10-CM | POA: Diagnosis not present

## 2023-08-27 DIAGNOSIS — G309 Alzheimer's disease, unspecified: Secondary | ICD-10-CM | POA: Diagnosis not present

## 2023-08-27 DIAGNOSIS — R569 Unspecified convulsions: Secondary | ICD-10-CM | POA: Diagnosis not present

## 2023-09-02 DIAGNOSIS — R252 Cramp and spasm: Secondary | ICD-10-CM | POA: Diagnosis not present

## 2023-09-02 DIAGNOSIS — Z79899 Other long term (current) drug therapy: Secondary | ICD-10-CM | POA: Diagnosis not present

## 2023-09-02 DIAGNOSIS — F01A0 Vascular dementia, mild, without behavioral disturbance, psychotic disturbance, mood disturbance, and anxiety: Secondary | ICD-10-CM | POA: Diagnosis not present

## 2023-09-02 DIAGNOSIS — F03A0 Unspecified dementia, mild, without behavioral disturbance, psychotic disturbance, mood disturbance, and anxiety: Secondary | ICD-10-CM | POA: Diagnosis not present

## 2023-09-02 DIAGNOSIS — G40209 Localization-related (focal) (partial) symptomatic epilepsy and epileptic syndromes with complex partial seizures, not intractable, without status epilepticus: Secondary | ICD-10-CM | POA: Diagnosis not present

## 2023-09-27 DIAGNOSIS — J449 Chronic obstructive pulmonary disease, unspecified: Secondary | ICD-10-CM | POA: Diagnosis not present

## 2023-09-27 DIAGNOSIS — R569 Unspecified convulsions: Secondary | ICD-10-CM | POA: Diagnosis not present

## 2023-09-27 DIAGNOSIS — E782 Mixed hyperlipidemia: Secondary | ICD-10-CM | POA: Diagnosis not present

## 2023-09-27 DIAGNOSIS — G309 Alzheimer's disease, unspecified: Secondary | ICD-10-CM | POA: Diagnosis not present

## 2023-10-08 DIAGNOSIS — N183 Chronic kidney disease, stage 3 unspecified: Secondary | ICD-10-CM | POA: Diagnosis not present

## 2023-10-08 DIAGNOSIS — E7849 Other hyperlipidemia: Secondary | ICD-10-CM | POA: Diagnosis not present

## 2023-10-08 DIAGNOSIS — K219 Gastro-esophageal reflux disease without esophagitis: Secondary | ICD-10-CM | POA: Diagnosis not present

## 2023-10-08 DIAGNOSIS — E876 Hypokalemia: Secondary | ICD-10-CM | POA: Diagnosis not present

## 2023-10-11 DIAGNOSIS — Z1389 Encounter for screening for other disorder: Secondary | ICD-10-CM | POA: Diagnosis not present

## 2023-10-11 DIAGNOSIS — Z1331 Encounter for screening for depression: Secondary | ICD-10-CM | POA: Diagnosis not present

## 2023-10-11 DIAGNOSIS — Z0001 Encounter for general adult medical examination with abnormal findings: Secondary | ICD-10-CM | POA: Diagnosis not present

## 2023-10-11 DIAGNOSIS — N1832 Chronic kidney disease, stage 3b: Secondary | ICD-10-CM | POA: Diagnosis not present

## 2023-10-11 DIAGNOSIS — Z6832 Body mass index (BMI) 32.0-32.9, adult: Secondary | ICD-10-CM | POA: Diagnosis not present

## 2023-10-11 DIAGNOSIS — I259 Chronic ischemic heart disease, unspecified: Secondary | ICD-10-CM | POA: Diagnosis not present

## 2023-10-11 DIAGNOSIS — K5900 Constipation, unspecified: Secondary | ICD-10-CM | POA: Diagnosis not present

## 2023-10-11 DIAGNOSIS — Z9189 Other specified personal risk factors, not elsewhere classified: Secondary | ICD-10-CM | POA: Diagnosis not present

## 2023-10-11 DIAGNOSIS — G309 Alzheimer's disease, unspecified: Secondary | ICD-10-CM | POA: Diagnosis not present

## 2023-10-28 DIAGNOSIS — R569 Unspecified convulsions: Secondary | ICD-10-CM | POA: Diagnosis not present

## 2023-10-28 DIAGNOSIS — G309 Alzheimer's disease, unspecified: Secondary | ICD-10-CM | POA: Diagnosis not present

## 2023-10-28 DIAGNOSIS — J449 Chronic obstructive pulmonary disease, unspecified: Secondary | ICD-10-CM | POA: Diagnosis not present

## 2023-10-28 DIAGNOSIS — E782 Mixed hyperlipidemia: Secondary | ICD-10-CM | POA: Diagnosis not present

## 2023-11-10 DIAGNOSIS — H16141 Punctate keratitis, right eye: Secondary | ICD-10-CM | POA: Diagnosis not present

## 2023-11-10 DIAGNOSIS — Z6832 Body mass index (BMI) 32.0-32.9, adult: Secondary | ICD-10-CM | POA: Diagnosis not present

## 2023-11-10 DIAGNOSIS — H18231 Secondary corneal edema, right eye: Secondary | ICD-10-CM | POA: Diagnosis not present

## 2023-11-10 DIAGNOSIS — S0501XA Injury of conjunctiva and corneal abrasion without foreign body, right eye, initial encounter: Secondary | ICD-10-CM | POA: Diagnosis not present

## 2023-11-18 ENCOUNTER — Other Ambulatory Visit: Payer: Self-pay | Admitting: Cardiology

## 2023-11-26 DIAGNOSIS — R569 Unspecified convulsions: Secondary | ICD-10-CM | POA: Diagnosis not present

## 2023-11-26 DIAGNOSIS — J449 Chronic obstructive pulmonary disease, unspecified: Secondary | ICD-10-CM | POA: Diagnosis not present

## 2023-11-26 DIAGNOSIS — G309 Alzheimer's disease, unspecified: Secondary | ICD-10-CM | POA: Diagnosis not present

## 2023-11-26 DIAGNOSIS — E782 Mixed hyperlipidemia: Secondary | ICD-10-CM | POA: Diagnosis not present

## 2023-12-01 DIAGNOSIS — F01A0 Vascular dementia, mild, without behavioral disturbance, psychotic disturbance, mood disturbance, and anxiety: Secondary | ICD-10-CM | POA: Diagnosis not present

## 2023-12-01 DIAGNOSIS — K5909 Other constipation: Secondary | ICD-10-CM | POA: Diagnosis not present

## 2023-12-01 DIAGNOSIS — G40209 Localization-related (focal) (partial) symptomatic epilepsy and epileptic syndromes with complex partial seizures, not intractable, without status epilepticus: Secondary | ICD-10-CM | POA: Diagnosis not present

## 2023-12-06 DIAGNOSIS — L821 Other seborrheic keratosis: Secondary | ICD-10-CM | POA: Diagnosis not present

## 2023-12-06 DIAGNOSIS — L570 Actinic keratosis: Secondary | ICD-10-CM | POA: Diagnosis not present

## 2023-12-28 DIAGNOSIS — R569 Unspecified convulsions: Secondary | ICD-10-CM | POA: Diagnosis not present

## 2023-12-28 DIAGNOSIS — E782 Mixed hyperlipidemia: Secondary | ICD-10-CM | POA: Diagnosis not present

## 2023-12-28 DIAGNOSIS — J449 Chronic obstructive pulmonary disease, unspecified: Secondary | ICD-10-CM | POA: Diagnosis not present

## 2023-12-28 DIAGNOSIS — E039 Hypothyroidism, unspecified: Secondary | ICD-10-CM | POA: Diagnosis not present

## 2024-01-12 ENCOUNTER — Encounter (INDEPENDENT_AMBULATORY_CARE_PROVIDER_SITE_OTHER): Payer: Self-pay | Admitting: Gastroenterology

## 2024-01-27 DIAGNOSIS — H43393 Other vitreous opacities, bilateral: Secondary | ICD-10-CM | POA: Diagnosis not present

## 2024-01-27 DIAGNOSIS — H524 Presbyopia: Secondary | ICD-10-CM | POA: Diagnosis not present

## 2024-01-27 DIAGNOSIS — H2513 Age-related nuclear cataract, bilateral: Secondary | ICD-10-CM | POA: Diagnosis not present

## 2024-01-27 DIAGNOSIS — H18593 Other hereditary corneal dystrophies, bilateral: Secondary | ICD-10-CM | POA: Diagnosis not present

## 2024-01-28 DIAGNOSIS — G309 Alzheimer's disease, unspecified: Secondary | ICD-10-CM | POA: Diagnosis not present

## 2024-01-28 DIAGNOSIS — E782 Mixed hyperlipidemia: Secondary | ICD-10-CM | POA: Diagnosis not present

## 2024-01-28 DIAGNOSIS — J449 Chronic obstructive pulmonary disease, unspecified: Secondary | ICD-10-CM | POA: Diagnosis not present

## 2024-01-28 DIAGNOSIS — R569 Unspecified convulsions: Secondary | ICD-10-CM | POA: Diagnosis not present

## 2024-02-25 DIAGNOSIS — J449 Chronic obstructive pulmonary disease, unspecified: Secondary | ICD-10-CM | POA: Diagnosis not present

## 2024-02-25 DIAGNOSIS — R569 Unspecified convulsions: Secondary | ICD-10-CM | POA: Diagnosis not present

## 2024-02-25 DIAGNOSIS — E782 Mixed hyperlipidemia: Secondary | ICD-10-CM | POA: Diagnosis not present

## 2024-02-25 DIAGNOSIS — G309 Alzheimer's disease, unspecified: Secondary | ICD-10-CM | POA: Diagnosis not present
